# Patient Record
Sex: Male | Born: 1995 | Race: White | Hispanic: No | Marital: Single | Smoking: Current every day smoker
Health system: Southern US, Community
[De-identification: ages and names within clinical notes are randomized; demographics above are authoritative.]

## PROBLEM LIST (undated history)

## (undated) DIAGNOSIS — F1510 Other stimulant abuse, uncomplicated: Secondary | ICD-10-CM

## (undated) DIAGNOSIS — F1110 Opioid abuse, uncomplicated: Secondary | ICD-10-CM

## (undated) DIAGNOSIS — Z720 Tobacco use: Secondary | ICD-10-CM

## (undated) DIAGNOSIS — F419 Anxiety disorder, unspecified: Secondary | ICD-10-CM

## (undated) DIAGNOSIS — F1910 Other psychoactive substance abuse, uncomplicated: Secondary | ICD-10-CM

## (undated) DIAGNOSIS — F329 Major depressive disorder, single episode, unspecified: Secondary | ICD-10-CM

## (undated) DIAGNOSIS — F1010 Alcohol abuse, uncomplicated: Secondary | ICD-10-CM

## (undated) DIAGNOSIS — I10 Essential (primary) hypertension: Secondary | ICD-10-CM

## (undated) DIAGNOSIS — M47812 Spondylosis without myelopathy or radiculopathy, cervical region: Secondary | ICD-10-CM

## (undated) DIAGNOSIS — M549 Dorsalgia, unspecified: Secondary | ICD-10-CM

## (undated) DIAGNOSIS — F32A Depression, unspecified: Secondary | ICD-10-CM

## (undated) DIAGNOSIS — F200 Paranoid schizophrenia: Secondary | ICD-10-CM

## (undated) DIAGNOSIS — F1113 Opioid abuse with withdrawal: Secondary | ICD-10-CM

---
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
## 2015-06-02 ENCOUNTER — Emergency Department
Admission: EM | Admit: 2015-06-02 | Discharge: 2015-06-02 | Discharge status: Against Medical Advice | Payer: Medicaid Other | Attending: Emergency Medicine | Admitting: Emergency Medicine

## 2015-06-02 DIAGNOSIS — H1131 Conjunctival hemorrhage, right eye: Secondary | ICD-10-CM | POA: Insufficient documentation

## 2015-06-02 DIAGNOSIS — F17200 Nicotine dependence, unspecified, uncomplicated: Secondary | ICD-10-CM | POA: Diagnosis not present

## 2015-06-02 DIAGNOSIS — F1410 Cocaine abuse, uncomplicated: Secondary | ICD-10-CM | POA: Diagnosis present

## 2015-06-02 DIAGNOSIS — Y9269 Other specified industrial and construction area as the place of occurrence of the external cause: Secondary | ICD-10-CM | POA: Diagnosis not present

## 2015-06-02 DIAGNOSIS — W11XXXA Fall on and from ladder, initial encounter: Secondary | ICD-10-CM | POA: Insufficient documentation

## 2015-06-02 DIAGNOSIS — Y998 Other external cause status: Secondary | ICD-10-CM | POA: Diagnosis not present

## 2015-06-02 DIAGNOSIS — S0083XA Contusion of other part of head, initial encounter: Secondary | ICD-10-CM | POA: Insufficient documentation

## 2015-06-02 DIAGNOSIS — Y9389 Activity, other specified: Secondary | ICD-10-CM | POA: Insufficient documentation

## 2015-06-02 NOTE — ED Notes (Signed)
Dr spoke with pt - pt states he will stay then stated he was leaving - took clothes and left.

## 2015-06-02 NOTE — ED Notes (Signed)
Pt gave urine sample - then states he wants to leave. Dr made aware. Pt is voluntary for drug treatment and is calling for a ride.
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
## 2015-06-02 NOTE — ED Notes (Addendum)
Patient to ER for detox from drugs and alcohol. Voluntary.

## 2015-06-02 NOTE — ED Notes (Signed)
Pt arrives to ER wanting detox from alcohol, heroin, crack and cigarettes. Pt has not gone through detox before. Pt last used crack this AM. Pt has not used alcohol in " a couple of days". Denies HI or SI. Pt alert and oriented X4, active, cooperative, pt in NAD. RR even and unlabored, color WNL.

## 2015-06-02 NOTE — ED Notes (Signed)
Pt asking for drug and alcohol treatment. Dr is with pt speaking with him, calvin from tts is made aware.

## 2016-04-21 ENCOUNTER — Emergency Department (HOSPITAL_COMMUNITY)
Admission: EM | Admit: 2016-04-21 | Discharge: 2016-04-21 | Disposition: A | Discharge status: Home / Self Care | Payer: Medicaid Other | Attending: Emergency Medicine | Admitting: Emergency Medicine

## 2016-04-21 ENCOUNTER — Encounter (HOSPITAL_COMMUNITY): Payer: Self-pay | Admitting: Emergency Medicine

## 2016-04-21 DIAGNOSIS — T148XXA Other injury of unspecified body region, initial encounter: Secondary | ICD-10-CM

## 2016-04-21 DIAGNOSIS — R300 Dysuria: Secondary | ICD-10-CM | POA: Insufficient documentation

## 2016-04-21 DIAGNOSIS — X58XXXA Exposure to other specified factors, initial encounter: Secondary | ICD-10-CM | POA: Diagnosis not present

## 2016-04-21 DIAGNOSIS — Y939 Activity, unspecified: Secondary | ICD-10-CM | POA: Diagnosis not present

## 2016-04-21 DIAGNOSIS — S299XXA Unspecified injury of thorax, initial encounter: Secondary | ICD-10-CM | POA: Diagnosis present

## 2016-04-21 DIAGNOSIS — I10 Essential (primary) hypertension: Secondary | ICD-10-CM | POA: Diagnosis not present

## 2016-04-21 DIAGNOSIS — F17200 Nicotine dependence, unspecified, uncomplicated: Secondary | ICD-10-CM | POA: Diagnosis not present

## 2016-04-21 DIAGNOSIS — S29012A Strain of muscle and tendon of back wall of thorax, initial encounter: Secondary | ICD-10-CM | POA: Insufficient documentation

## 2016-04-21 DIAGNOSIS — Y929 Unspecified place or not applicable: Secondary | ICD-10-CM | POA: Diagnosis not present

## 2016-04-21 DIAGNOSIS — Y999 Unspecified external cause status: Secondary | ICD-10-CM | POA: Diagnosis not present

## 2016-04-21 DIAGNOSIS — S39012A Strain of muscle, fascia and tendon of lower back, initial encounter: Secondary | ICD-10-CM | POA: Insufficient documentation

## 2016-04-21 HISTORY — DX: Depression, unspecified: F32.A

## 2016-04-21 HISTORY — DX: Anxiety disorder, unspecified: F41.9

## 2016-04-21 HISTORY — DX: Major depressive disorder, single episode, unspecified: F32.9

## 2016-04-21 HISTORY — DX: Paranoid schizophrenia: F20.0

## 2016-04-21 HISTORY — DX: Dorsalgia, unspecified: M54.9

## 2016-04-21 HISTORY — DX: Essential (primary) hypertension: I10

## 2016-04-21 LAB — URINALYSIS, ROUTINE W REFLEX MICROSCOPIC
Bilirubin Urine: NEGATIVE
Glucose, UA: NEGATIVE mg/dL
Hgb urine dipstick: NEGATIVE
Leukocytes, UA: NEGATIVE
Nitrite: NEGATIVE
Protein, ur: NEGATIVE mg/dL
Specific Gravity, Urine: 1.015 (ref 1.005–1.030)
pH: 7.5 (ref 5.0–8.0)

## 2016-04-21 LAB — BASIC METABOLIC PANEL
Anion gap: 8 (ref 5–15)
BUN: 20 mg/dL (ref 6–20)
CO2: 28 mmol/L (ref 22–32)
Calcium: 9.8 mg/dL (ref 8.9–10.3)
Chloride: 100 mmol/L — ABNORMAL LOW (ref 101–111)
Creatinine, Ser: 1.33 mg/dL — ABNORMAL HIGH (ref 0.61–1.24)
GFR calc Af Amer: >60 mL/min (ref >60)
GFR calc non Af Amer: >60 mL/min (ref >60)
Glucose, Bld: 102 mg/dL — ABNORMAL HIGH (ref 65–99)
Potassium: 4.3 mmol/L (ref 3.5–5.1)
Sodium: 136 mmol/L (ref 135–145)

## 2016-04-21 LAB — CBC
HCT: 44 % (ref 39.0–52.0)
Hemoglobin: 14.7 g/dL (ref 13.0–17.0)
MCH: 28.8 pg (ref 26.0–34.0)
MCHC: 33.4 g/dL (ref 30.0–36.0)
MCV: 86.3 fL (ref 78.0–100.0)
Platelets: 333 10*3/uL (ref 150–400)
RBC: 5.1 MIL/uL (ref 4.22–5.81)
RDW: 12.9 % (ref 11.5–15.5)
WBC: 8.6 10*3/uL (ref 4.0–10.5)

## 2016-04-21 LAB — CK: CK TOTAL: 148 U/L (ref 49–397)

## 2016-04-21 MED ORDER — METHOCARBAMOL 500 MG PO TABS: 500.0000 mg | ORAL_TABLET | Freq: Three times a day (TID) | ORAL | 0 refills | Status: DC

## 2016-04-21 MED ORDER — TRAMADOL HCL 50 MG PO TABS
100.0000 mg | ORAL_TABLET | Freq: Once | ORAL | Status: AC
  Administered 2016-04-21: 100 mg via ORAL
  Filled 2016-04-21: qty 2

## 2016-04-21 MED ORDER — METHOCARBAMOL 500 MG PO TABS
1000.0000 mg | ORAL_TABLET | Freq: Once | ORAL | Status: AC
  Administered 2016-04-21: 1000 mg via ORAL
  Filled 2016-04-21: qty 2

## 2016-04-21 MED ORDER — ONDANSETRON HCL 4 MG PO TABS
4.0000 mg | ORAL_TABLET | Freq: Once | ORAL | Status: AC
Start: 2016-04-21 — End: 2016-04-21
  Administered 2016-04-21: 4 mg via ORAL
  Filled 2016-04-21: qty 1

## 2016-04-21 NOTE — ED Provider Notes (Signed)
[facility]-EMERGENCY DEPT Provider Note   CSN: [id number] Arrival date & time: 04/21/16  [id number]     History   Chief Complaint Chief Complaint  Patient presents with  . Flank Pain    HPI Cody Poole is a 21 y.o. male.  Patient is a 21 year old male who presents to the emergency department with a complaint of bilateral flank area pain. The patient presents by EMS with a two-week history of bilateral flank pain and back pain. The patient also states that he has some burning with urination. The patient also mentioned to the nurse during triage "I was supposed to be in court today so I need a note when I leave".  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 He also states that he has back problems regularly. He has family members who have had scoliosis in the past, and he has not been evaluated for the scoliosis. He states that it has been his intention to see a doctor over the past week, but he has not had an opportunity to do so. Today the pain was more intense and he said he just could not take it anymore so he presented to the emergency department for evaluation. The patient denies any scrotal area pain, this been no discharge from the penis. He's not had any injury to his flank that he is aware of. He has not seen any blood in his urine on, and he's not had any operations or procedures involving his kidneys. He has no difficulty with his breathing, he is not coughing up blood, and has not had any injury to his chest or lung area. He has not taken any medication for this discomfort up to this point.      Past Medical History:  Diagnosis Date  . Anxiety   . Back pain   . Depression   . Hypertension   . Paranoid schizophrenia (HCC)     There are no active problems to display for this patient.   History reviewed. No pertinent surgical history.     Home Medications    Prior to Admission medications   Not on  File    Family History History reviewed. No pertinent family history.  Social History Social History  Substance Use Topics  . Smoking status: Current Every Day Smoker  . Smokeless tobacco: Never Used  . Alcohol use No     Allergies   Patient has no active allergies.   Review of Systems Review of Systems  Constitutional: Negative for activity change and fever.       All ROS Neg except as noted in HPI  HENT: Negative for nosebleeds.   Eyes: Negative for photophobia and discharge.  Respiratory: Negative for cough, shortness of breath and wheezing.   Cardiovascular: Negative for chest pain and palpitations.  Gastrointestinal: Negative for abdominal pain and blood in stool.  Genitourinary: Positive for dysuria. Negative for discharge, frequency, hematuria, penile pain, penile swelling and testicular pain.  Musculoskeletal: Positive for back pain. Negative for arthralgias and neck pain.  Skin: Negative.   Neurological: Negative for dizziness, seizures and speech difficulty.  Psychiatric/Behavioral: Negative for confusion and hallucinations.     Physical Exam Updated Vital Signs BP 162/96 (BP Location: Left Arm)   Pulse 82   Temp 97.6 F (36.4 C) (Oral)   Resp 16   Ht 5\' 9"  (1.753 m)   Wt 83.9 kg   SpO2 96%  BMI 27.32 kg/m   Physical Exam  Constitutional: He is oriented to person, place, and time. He appears well-developed and well-nourished.  Non-toxic appearance.  HENT:  Head: Normocephalic.  Right Ear: Tympanic membrane and external ear normal.  Left Ear: Tympanic membrane and external ear normal.  Eyes: EOM and lids are normal. Pupils are equal, round, and reactive to light.  Neck: Normal range of motion. Neck supple. Carotid bruit is not present.  Cardiovascular: Normal rate, regular rhythm, normal heart sounds, intact distal pulses and normal pulses.   Pulmonary/Chest: Breath sounds normal. No respiratory distress.  Abdominal: Soft. Bowel sounds are normal.  There is no tenderness. There is no guarding.  Musculoskeletal: Normal range of motion.       Thoracic back: He exhibits tenderness and spasm.       Lumbar back: He exhibits spasm.       Back:  Lymphadenopathy:       Head (right side): No submandibular adenopathy present.       Head (left side): No submandibular adenopathy present.    He has no cervical adenopathy.  Neurological: He is alert and oriented to person, place, and time. He has normal strength. No cranial nerve deficit or sensory deficit.  Skin: Skin is warm and dry.  Psychiatric: He has a normal mood and affect. His speech is normal.  Nursing note and vitals reviewed.    ED Treatments / Results  Labs (all labs ordered are listed, but only abnormal results are displayed) Labs Reviewed  URINALYSIS, ROUTINE W REFLEX MICROSCOPIC  CBC  BASIC METABOLIC PANEL    EKG  EKG Interpretation None       Radiology No results found.  Procedures Procedures (including critical care time)  Medications Ordered in ED Medications - No data to display   Initial Impression / Assessment and Plan / ED Course  I have reviewed the triage vital signs and the nursing notes.  Pertinent labs & imaging results that were available during my care of the patient were reviewed by me and considered in my medical decision making (see chart for details).  Clinical Course     **I have reviewed nursing notes, vital signs, and all appropriate lab and imaging results for this patient.*  Final Clinical Impressions(s) / ED Diagnoses  MDM 21 year old male who presents to the emergency department with bilateral flank pain and burning with urination. The CK is within normal limits at 148. Doubt rhabdomyolysis or muscle injury. Urinalysis shows a clear yellow specimen with a specific gravity 1.015. There is no glucose present, no hemoglobin present on dipstick, there is a trace of ketones, otherwise the urine test is completely negative. Doubt  urinary tract infection or kidney stone or dehydration. The complete blood count was well within normal limits. Doubt a systemic infection. The basic metabolic panel shows the chloride to be low at 100 and the glucose to be high at 102. The creatinine is elevated at 1.33. The patient states however that over the past week he's been having some upper respiratory symptoms. I have asked the patient to see his Medicaid access physician for additional evaluation and recheck of this elevated creatinine on.  The patient has on been drinking liquids in the emergency department without problem. He has been treated with a muscle relaxer and pain medication. He has been on his phone most of the time during his emergency department visit, and appears to be in no distress. He speaks in complete sentences without any problem. He does  walk with some slowness and stiffness, complaining of his back hurting him.  The plan at this time is for the patient to be treated with Robaxin. He will be referred to Dr. Romeo Apple for orthopedic evaluation if not improving. A work note has been given to the patient on excusing him to return on Friday, January 12.    Final diagnoses:  Muscle strain    New Prescriptions New Prescriptions   No medications on file     Ivery Quale, [place] 04/21/16 0981    Raeford Razor, MD 04/24/16 [phone]

## 2016-04-21 NOTE — Discharge Instructions (Signed)
Your emergency room examination is negative for urinary tract infection, and negative for evidence of kidney stone. There is no evidence of injury to the muscle. There is question of muscle strain, particularly concerning your back. Please use warm Epsom salt soaks for about 10-15 minutes daily. Use Robaxin 3 times daily for muscle spasm. This medication may cause drowsiness. Please do not drink alcohol, drive a vehicle, operating machinery, or participate in activities requiring concentration when taking this medication. Please see Dr. Romeo AppleHarrison for orthopedic evaluation if this is not improving. Your creatinine, which is one of your kidney function test was elevated today. Please see your Medicaid access physician for recheck of this in about 1-2 weeks.

## 2016-04-21 NOTE — ED Triage Notes (Addendum)
Patient brought in by EMS with complaint of bilateral flank pain x 2 weeks. Also complains of burning with urination. Per patient, "I was supposed to be in court today so I need a note when I leave."

## 2020-04-21 ENCOUNTER — Emergency Department (HOSPITAL_COMMUNITY)
Admission: EM | Admit: 2020-04-21 | Discharge: 2020-04-21 | Disposition: A | Discharge status: Home / Self Care | Payer: Self-pay | Attending: Emergency Medicine | Admitting: Emergency Medicine

## 2020-04-21 ENCOUNTER — Other Ambulatory Visit: Payer: Self-pay

## 2020-04-21 ENCOUNTER — Encounter (HOSPITAL_COMMUNITY): Payer: Self-pay

## 2020-04-21 DIAGNOSIS — H9209 Otalgia, unspecified ear: Secondary | ICD-10-CM | POA: Insufficient documentation

## 2020-04-21 DIAGNOSIS — Z5321 Procedure and treatment not carried out due to patient leaving prior to being seen by health care provider: Secondary | ICD-10-CM | POA: Insufficient documentation

## 2020-04-21 DIAGNOSIS — K122 Cellulitis and abscess of mouth: Secondary | ICD-10-CM | POA: Insufficient documentation

## 2020-04-21 NOTE — ED Notes (Signed)
Pt called 3x no response  

## 2020-04-21 NOTE — ED Triage Notes (Signed)
Pt reports he is here today due to ear pain. Pt reports he has an abscess in his mouth and the pain is radiating to his ear.

## 2020-08-24 ENCOUNTER — Other Ambulatory Visit: Payer: Self-pay

## 2020-08-24 ENCOUNTER — Encounter (HOSPITAL_COMMUNITY): Payer: Self-pay

## 2020-08-24 ENCOUNTER — Ambulatory Visit (HOSPITAL_COMMUNITY)
Admission: EM | Admit: 2020-08-24 | Discharge: 2020-08-24 | Disposition: A | Discharge status: Home / Self Care | Payer: Medicaid Other | Attending: Medical Oncology | Admitting: Medical Oncology

## 2020-08-24 DIAGNOSIS — H6593 Unspecified nonsuppurative otitis media, bilateral: Secondary | ICD-10-CM

## 2020-08-24 DIAGNOSIS — F200 Paranoid schizophrenia: Secondary | ICD-10-CM

## 2020-08-24 DIAGNOSIS — B353 Tinea pedis: Secondary | ICD-10-CM

## 2020-08-24 MED ORDER — FLUTICASONE PROPIONATE 50 MCG/ACT NA SUSP: 2.0000 | Freq: Every day | NASAL | 0 refills | Status: DC

## 2020-08-24 MED ORDER — CLOTRIMAZOLE 1 % EX CREA: TOPICAL_CREAM | CUTANEOUS | 0 refills | Status: DC

## 2020-08-24 NOTE — ED Provider Notes (Signed)
[facility]    CSN: [id number] Arrival date & time: 08/24/20  1147      History   Chief Complaint Chief Complaint  Patient presents with  . Sinus Issues    HPI Cody Poole is a 25 y.o. male.   HPI  Sinus Issues: Patient states that for the past 1 to 6 months he has had some sinus congestion.  He is concerned that he has bugs or parasites in his ears and nose.  He also is concerned that he has bugs or parasites on his feet.  He reports that he has been out of his psychiatric medication for quite some time and has had delusions, hearing voices, etc. he states that the "feds are out to kill me" at random during our encounter but otherwise is able to keep conversation fairly in line with our conversation.  He states that he has been using a 10% bleach solution on his skin to help with his symptoms without improvement.  Past Medical History:  Diagnosis Date  . Anxiety   . Back pain   . Depression   . Hypertension   . Paranoid schizophrenia (HCC)     There are no problems to display for this patient.   History reviewed. No pertinent surgical history.    Home Medications    Prior to Admission medications   Medication Sig Start Date End Date Taking? Authorizing Provider  methocarbamol (ROBAXIN) 500 MG tablet Take 1 tablet (500 mg total) by mouth 3 (three) times daily. 04/21/16   Ivery Quale, PA-C    Family History Family History  Family history unknown: Yes    Social History Social History   Tobacco Use  . Smoking status: Current Every Day Smoker  . Smokeless tobacco: Never Used  Substance Use Topics  . Alcohol use: No  . Drug use: Yes    Types: IV, Cocaine    Comment: Denies recent use     Allergies   Patient has no known allergies.   Review of Systems Review of Systems  As stated above in HPI Physical Exam Triage Vital Signs ED Triage Vitals  Enc Vitals Group     BP 08/24/20 1219 123/87     Pulse Rate 08/24/20 1219 (!) 140     Resp  08/24/20 1219 20     Temp 08/24/20 1219 98.6 F (37 C)     Temp Source 08/24/20 1219 Oral     SpO2 08/24/20 1219 98 %     Weight --      Height --      Head Circumference --      Peak Flow --      Pain Score 08/24/20 1217 4     Pain Loc --      Pain Edu? --      Excl. in GC? --    No data found.  Updated Vital Signs BP 123/87 (BP Location: Right Arm)   Pulse (!) 140   Temp 98.6 F (37 C) (Oral)   Resp 20   SpO2 98%   Physical Exam Vitals and nursing note reviewed.  Constitutional:      General: He is not in acute distress.    Appearance: He is not ill-appearing, toxic-appearing or diaphoretic.  HENT:     Head: Normocephalic and atraumatic.     Right Ear: Tympanic membrane, ear canal and external ear normal. There is no impacted cerumen.     Left Ear: Tympanic membrane, ear canal and external  ear normal. There is no impacted cerumen.     Ears:     Comments: Mild clear middle ear effusion    Nose: Nose normal.     Mouth/Throat:     Mouth: Mucous membranes are moist.     Pharynx: Oropharynx is clear. No oropharyngeal exudate or posterior oropharyngeal erythema.  Eyes:     Extraocular Movements: Extraocular movements intact.     Pupils: Pupils are equal, round, and reactive to light.  Cardiovascular:     Rate and Rhythm: Normal rate and regular rhythm.     Heart sounds: Normal heart sounds.     Comments: Rate normalized by the time he was examined Pulmonary:     Effort: Pulmonary effort is normal.     Breath sounds: Normal breath sounds.  Musculoskeletal:     Cervical back: Neck supple.  Lymphadenopathy:     Cervical: No cervical adenopathy.  Skin:    Findings: Rash (mild erythema and mild scale of bilateral feet) present.  Neurological:     Mental Status: He is alert and oriented to person, place, and time.  Psychiatric:     Comments: Thoughts are pressured in general are not tangential.  He has a flat affect.       UC Treatments / Results  Labs (all labs  ordered are listed, but only abnormal results are displayed) Labs Reviewed - No data to display  EKG   Radiology No results found.  Procedures Procedures (including critical care time)  Medications Ordered in UC Medications - No data to display  Initial Impression / Assessment and Plan / UC Course  I have reviewed the triage vital signs and the nursing notes.  Pertinent labs & imaging results that were available during my care of the patient were reviewed by me and considered in my medical decision making (see chart for details).     Sinus congestion and middle ear effusion and tinea pedis.  I have recommended Flonase and athlete's foot cream for his symptoms respectively.  I discussed these with patient and he felt more comfortable with his diagnoses.  I encouraged him to go to our emergency department or our psychiatric urgent care and he states that he will do this to get restarted on his medications. Final Clinical Impressions(s) / UC Diagnoses   Final diagnoses:  None   Discharge Instructions   None    ED Prescriptions    None     PDMP not reviewed this encounter.   Rushie Chestnut, [place] 08/24/20 1244

## 2020-08-24 NOTE — ED Notes (Signed)
Pt states he takes haldol and cyboxin but does not remember the dosage.

## 2020-08-24 NOTE — ED Triage Notes (Signed)
Pt presents with sinus congestion & pain and bilateral ear pain X 1 month.

## 2020-10-13 ENCOUNTER — Emergency Department (HOSPITAL_COMMUNITY): Payer: Self-pay

## 2020-10-13 ENCOUNTER — Emergency Department (HOSPITAL_COMMUNITY)
Admission: EM | Admit: 2020-10-13 | Discharge: 2020-10-13 | Disposition: A | Discharge status: Home / Self Care | Payer: Self-pay | Attending: Emergency Medicine | Admitting: Emergency Medicine

## 2020-10-13 DIAGNOSIS — Z20822 Contact with and (suspected) exposure to covid-19: Secondary | ICD-10-CM | POA: Insufficient documentation

## 2020-10-13 DIAGNOSIS — Z23 Encounter for immunization: Secondary | ICD-10-CM | POA: Insufficient documentation

## 2020-10-13 DIAGNOSIS — R000 Tachycardia, unspecified: Secondary | ICD-10-CM | POA: Insufficient documentation

## 2020-10-13 DIAGNOSIS — R410 Disorientation, unspecified: Secondary | ICD-10-CM | POA: Insufficient documentation

## 2020-10-13 DIAGNOSIS — I10 Essential (primary) hypertension: Secondary | ICD-10-CM | POA: Insufficient documentation

## 2020-10-13 DIAGNOSIS — F1910 Other psychoactive substance abuse, uncomplicated: Secondary | ICD-10-CM | POA: Insufficient documentation

## 2020-10-13 DIAGNOSIS — R4182 Altered mental status, unspecified: Secondary | ICD-10-CM | POA: Insufficient documentation

## 2020-10-13 DIAGNOSIS — F19129 Other psychoactive substance abuse with intoxication, unspecified: Secondary | ICD-10-CM | POA: Insufficient documentation

## 2020-10-13 DIAGNOSIS — T1490XA Injury, unspecified, initial encounter: Secondary | ICD-10-CM

## 2020-10-13 DIAGNOSIS — M545 Low back pain, unspecified: Secondary | ICD-10-CM | POA: Insufficient documentation

## 2020-10-13 DIAGNOSIS — S50811A Abrasion of right forearm, initial encounter: Secondary | ICD-10-CM | POA: Insufficient documentation

## 2020-10-13 DIAGNOSIS — F19920 Other psychoactive substance use, unspecified with intoxication, uncomplicated: Secondary | ICD-10-CM

## 2020-10-13 DIAGNOSIS — F17200 Nicotine dependence, unspecified, uncomplicated: Secondary | ICD-10-CM | POA: Insufficient documentation

## 2020-10-13 DIAGNOSIS — R109 Unspecified abdominal pain: Secondary | ICD-10-CM | POA: Insufficient documentation

## 2020-10-13 LAB — I-STAT CHEM 8, ED
BUN: 19 mg/dL (ref 6–20)
Calcium, Ion: 0.68 mmol/L — CL (ref 1.15–1.40)
Chloride: 115 mmol/L — ABNORMAL HIGH (ref 98–111)
Creatinine, Ser: 0.4 mg/dL — ABNORMAL LOW (ref 0.61–1.24)
Glucose, Bld: 83 mg/dL (ref 70–99)
HCT: 26 % — ABNORMAL LOW (ref 39.0–52.0)
Hemoglobin: 8.8 g/dL — ABNORMAL LOW (ref 13.0–17.0)
Potassium: 3.3 mmol/L — ABNORMAL LOW (ref 3.5–5.1)
Sodium: 142 mmol/L (ref 135–145)
TCO2: 18 mmol/L — ABNORMAL LOW (ref 22–32)

## 2020-10-13 LAB — RAPID URINE DRUG SCREEN, HOSP PERFORMED
Amphetamines: POSITIVE — AB
Barbiturates: NOT DETECTED
Benzodiazepines: NOT DETECTED
Cocaine: NOT DETECTED
Opiates: POSITIVE — AB
Tetrahydrocannabinol: POSITIVE — AB

## 2020-10-13 LAB — CBC
HCT: 40.5 % (ref 39.0–52.0)
Hemoglobin: 12.6 g/dL — ABNORMAL LOW (ref 13.0–17.0)
MCH: 29.3 pg (ref 26.0–34.0)
MCHC: 31.1 g/dL (ref 30.0–36.0)
MCV: 94.2 fL (ref 80.0–100.0)
Platelets: UNDETERMINED 10*3/uL (ref 150–400)
RBC: 4.3 MIL/uL (ref 4.22–5.81)
RDW: 13.5 % (ref 11.5–15.5)
WBC: 2.4 10*3/uL — ABNORMAL LOW (ref 4.0–10.5)
nRBC: 0 % (ref 0.0–0.2)

## 2020-10-13 LAB — COMPREHENSIVE METABOLIC PANEL
ALT: 17 U/L (ref 0–44)
AST: 32 U/L (ref 15–41)
Albumin: 4.6 g/dL (ref 3.5–5.0)
Alkaline Phosphatase: 69 U/L (ref 38–126)
Anion gap: 9 (ref 5–15)
BUN: 22 mg/dL — ABNORMAL HIGH (ref 6–20)
CO2: 24 mmol/L (ref 22–32)
Calcium: 9.5 mg/dL (ref 8.9–10.3)
Chloride: 103 mmol/L (ref 98–111)
Creatinine, Ser: 0.87 mg/dL (ref 0.61–1.24)
GFR, Estimated: >60 mL/min (ref >60)
Glucose, Bld: 120 mg/dL — ABNORMAL HIGH (ref 70–99)
Potassium: 4.1 mmol/L (ref 3.5–5.1)
Sodium: 136 mmol/L (ref 135–145)
Total Bilirubin: 1.4 mg/dL — ABNORMAL HIGH (ref 0.3–1.2)
Total Protein: 7.6 g/dL (ref 6.5–8.1)

## 2020-10-13 LAB — URINALYSIS, ROUTINE W REFLEX MICROSCOPIC
Bilirubin Urine: NEGATIVE
Glucose, UA: NEGATIVE mg/dL
Hgb urine dipstick: NEGATIVE
Ketones, ur: 5 mg/dL — AB
Leukocytes,Ua: NEGATIVE
Nitrite: NEGATIVE
Protein, ur: NEGATIVE mg/dL
Specific Gravity, Urine: >1.046 — ABNORMAL HIGH (ref 1.005–1.030)
pH: 5 (ref 5.0–8.0)

## 2020-10-13 LAB — PROTIME-INR
INR: 1.1 (ref 0.8–1.2)
Prothrombin Time: 14.3 seconds (ref 11.4–15.2)

## 2020-10-13 LAB — LACTIC ACID, PLASMA: Lactic Acid, Venous: 0.4 mmol/L — ABNORMAL LOW (ref 0.5–1.9)

## 2020-10-13 LAB — ETHANOL: Alcohol, Ethyl (B): <10 mg/dL (ref <10)

## 2020-10-13 LAB — TROPONIN I (HIGH SENSITIVITY): Troponin I (High Sensitivity): <2 ng/L (ref <18)

## 2020-10-13 LAB — RESP PANEL BY RT-PCR (FLU A&B, COVID) ARPGX2
Influenza A by PCR: NEGATIVE
Influenza B by PCR: NEGATIVE
SARS Coronavirus 2 by RT PCR: NEGATIVE

## 2020-10-13 MED ORDER — LACTATED RINGERS IV BOLUS: 1000.0000 mL | Freq: Once | INTRAVENOUS | Status: DC

## 2020-10-13 MED ORDER — IOHEXOL 300 MG/ML  SOLN
100.0000 mL | Freq: Once | INTRAMUSCULAR | Status: AC | PRN
  Administered 2020-10-13: 100 mL via INTRAVENOUS

## 2020-10-13 MED ORDER — LACTATED RINGERS IV BOLUS
1000.0000 mL | Freq: Once | INTRAVENOUS | Status: AC
  Administered 2020-10-13: 1000 mL via INTRAVENOUS

## 2020-10-13 MED ORDER — METHOCARBAMOL 500 MG PO TABS: 500.0000 mg | ORAL_TABLET | Freq: Two times a day (BID) | ORAL | 0 refills | Status: DC

## 2020-10-13 MED ORDER — TETANUS-DIPHTH-ACELL PERTUSSIS 5-2.5-18.5 LF-MCG/0.5 IM SUSY
0.5000 mL | PREFILLED_SYRINGE | Freq: Once | INTRAMUSCULAR | Status: AC
  Administered 2020-10-13: 0.5 mL via INTRAMUSCULAR
  Filled 2020-10-13: qty 0.5
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
## 2020-10-13 NOTE — ED Notes (Signed)
Patient is a poor historian,  PA student at the bedside. Patient appears under the influence of substance.  His speech is slurred and falls asleep quickly.  When asked if he is homeless, she answers "somewhat"

## 2020-10-13 NOTE — ED Notes (Signed)
Patient continues to appear drowsy- arouses easily.  Steffanie Rainwater is at the bedside.  Speech is mumbling, falls asleep mid sentence
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
## 2020-11-09 ENCOUNTER — Other Ambulatory Visit: Payer: Self-pay

## 2020-11-09 ENCOUNTER — Emergency Department (HOSPITAL_COMMUNITY)
Admission: EM | Admit: 2020-11-09 | Discharge: 2020-11-09 | Disposition: A | Discharge status: Home / Self Care | Payer: Medicaid Other | Attending: Physician Assistant | Admitting: Physician Assistant

## 2020-11-09 ENCOUNTER — Encounter (HOSPITAL_COMMUNITY): Payer: Self-pay | Admitting: Emergency Medicine

## 2020-11-09 DIAGNOSIS — Z5321 Procedure and treatment not carried out due to patient leaving prior to being seen by health care provider: Secondary | ICD-10-CM | POA: Insufficient documentation

## 2020-11-09 DIAGNOSIS — F209 Schizophrenia, unspecified: Secondary | ICD-10-CM | POA: Insufficient documentation

## 2020-11-09 NOTE — ED Triage Notes (Signed)
Arrives via EMS from a [facility], reports non-compliance with schizophrenia medications, took amphetamines yesterday and is now hallucinating. Per fiance pt is at baseline.

## 2020-11-09 NOTE — ED Provider Notes (Signed)
Emergency Medicine Provider Triage Evaluation Note  Cody Poole , a 25 y.o. male  was evaluated in triage.  Pt complains of worsening symptoms associated with schizophrenia.  Patient states that he has a history of schizophrenia and gets once monthly injections for this.  States he has not had an injection for about 6 weeks.  Reports hearing voices and "feeling as if people are out to get him".  States that he has difficulty going out in public due to his symptoms.  Denies any SI/HI.  Physical Exam  BP 121/86 (BP Location: Left Arm)   Pulse (!) 131   Temp 98 F (36.7 C) (Oral)   Resp 16   SpO2 96%  Gen:   Awake, no distress   Resp:  Normal effort  MSK:   Moves extremities without difficulty  Other:    Medical Decision Making  Medically screening exam initiated at 12:49 PM.  Appropriate orders placed.  Eileen Croswell was informed that the remainder of the evaluation will be completed by another provider, this initial triage assessment does not replace that evaluation, and the importance of remaining in the ED until their evaluation is complete.  Will obtain medical screening labs.   Placido Sou, PA-C 11/09/20 1252    Mancel Bale, MD 11/09/20 [id number]

## 2021-05-10 ENCOUNTER — Emergency Department (HOSPITAL_COMMUNITY): Payer: Medicaid Other

## 2021-05-10 ENCOUNTER — Encounter (HOSPITAL_COMMUNITY): Payer: Self-pay | Admitting: Emergency Medicine

## 2021-05-10 ENCOUNTER — Emergency Department (HOSPITAL_COMMUNITY)
Admission: EM | Admit: 2021-05-10 | Discharge: 2021-05-11 | Disposition: A | Discharge status: Other Acute Inpatient Hospital | Payer: Medicaid Other | Attending: Emergency Medicine | Admitting: Emergency Medicine

## 2021-05-10 ENCOUNTER — Other Ambulatory Visit: Payer: Self-pay

## 2021-05-10 DIAGNOSIS — R440 Auditory hallucinations: Secondary | ICD-10-CM | POA: Insufficient documentation

## 2021-05-10 DIAGNOSIS — F1520 Other stimulant dependence, uncomplicated: Secondary | ICD-10-CM | POA: Insufficient documentation

## 2021-05-10 DIAGNOSIS — J3489 Other specified disorders of nose and nasal sinuses: Secondary | ICD-10-CM | POA: Insufficient documentation

## 2021-05-10 DIAGNOSIS — R000 Tachycardia, unspecified: Secondary | ICD-10-CM | POA: Insufficient documentation

## 2021-05-10 DIAGNOSIS — R45851 Suicidal ideations: Secondary | ICD-10-CM | POA: Insufficient documentation

## 2021-05-10 DIAGNOSIS — R197 Diarrhea, unspecified: Secondary | ICD-10-CM | POA: Insufficient documentation

## 2021-05-10 DIAGNOSIS — F1910 Other psychoactive substance abuse, uncomplicated: Secondary | ICD-10-CM

## 2021-05-10 DIAGNOSIS — F1120 Opioid dependence, uncomplicated: Secondary | ICD-10-CM | POA: Insufficient documentation

## 2021-05-10 DIAGNOSIS — M791 Myalgia, unspecified site: Secondary | ICD-10-CM | POA: Insufficient documentation

## 2021-05-10 DIAGNOSIS — Z046 Encounter for general psychiatric examination, requested by authority: Secondary | ICD-10-CM | POA: Insufficient documentation

## 2021-05-10 DIAGNOSIS — R0602 Shortness of breath: Secondary | ICD-10-CM | POA: Insufficient documentation

## 2021-05-10 DIAGNOSIS — Z20822 Contact with and (suspected) exposure to covid-19: Secondary | ICD-10-CM | POA: Insufficient documentation

## 2021-05-10 DIAGNOSIS — R6889 Other general symptoms and signs: Secondary | ICD-10-CM

## 2021-05-10 DIAGNOSIS — R5383 Other fatigue: Secondary | ICD-10-CM | POA: Insufficient documentation

## 2021-05-10 LAB — RAPID URINE DRUG SCREEN, HOSP PERFORMED
Amphetamines: POSITIVE — AB
Barbiturates: NOT DETECTED
Benzodiazepines: NOT DETECTED
Cocaine: POSITIVE — AB
Opiates: NOT DETECTED
Tetrahydrocannabinol: POSITIVE — AB

## 2021-05-10 LAB — CBC WITH DIFFERENTIAL/PLATELET
Abs Immature Granulocytes: 0.02 10*3/uL (ref 0.00–0.07)
Basophils Absolute: 0 10*3/uL (ref 0.0–0.1)
Basophils Relative: 0 %
Eosinophils Absolute: 0 10*3/uL (ref 0.0–0.5)
Eosinophils Relative: 0 %
HCT: 44.3 % (ref 39.0–52.0)
Hemoglobin: 14.9 g/dL (ref 13.0–17.0)
Immature Granulocytes: 0 %
Lymphocytes Relative: 16 %
Lymphs Abs: 0.8 10*3/uL (ref 0.7–4.0)
MCH: 28.6 pg (ref 26.0–34.0)
MCHC: 33.6 g/dL (ref 30.0–36.0)
MCV: 85 fL (ref 80.0–100.0)
Monocytes Absolute: 0.3 10*3/uL (ref 0.1–1.0)
Monocytes Relative: 6 %
Neutro Abs: 4.1 10*3/uL (ref 1.7–7.7)
Neutrophils Relative %: 78 %
Platelets: 368 10*3/uL (ref 150–400)
RBC: 5.21 MIL/uL (ref 4.22–5.81)
RDW: 12.6 % (ref 11.5–15.5)
WBC: 5.3 10*3/uL (ref 4.0–10.5)
nRBC: 0 % (ref 0.0–0.2)

## 2021-05-10 LAB — COMPREHENSIVE METABOLIC PANEL
ALT: 40 U/L (ref 0–44)
AST: 46 U/L — ABNORMAL HIGH (ref 15–41)
Albumin: 3.7 g/dL (ref 3.5–5.0)
Alkaline Phosphatase: 76 U/L (ref 38–126)
Anion gap: 11 (ref 5–15)
BUN: 9 mg/dL (ref 6–20)
CO2: 25 mmol/L (ref 22–32)
Calcium: 9.8 mg/dL (ref 8.9–10.3)
Chloride: 103 mmol/L (ref 98–111)
Creatinine, Ser: 0.86 mg/dL (ref 0.61–1.24)
GFR, Estimated: >60 mL/min (ref >60)
Glucose, Bld: 107 mg/dL — ABNORMAL HIGH (ref 70–99)
Potassium: 3.9 mmol/L (ref 3.5–5.1)
Sodium: 139 mmol/L (ref 135–145)
Total Bilirubin: 0.8 mg/dL (ref 0.3–1.2)
Total Protein: 7.2 g/dL (ref 6.5–8.1)

## 2021-05-10 LAB — URINALYSIS, ROUTINE W REFLEX MICROSCOPIC
Bacteria, UA: NONE SEEN
Bilirubin Urine: NEGATIVE
Glucose, UA: NEGATIVE mg/dL
Hgb urine dipstick: NEGATIVE
Ketones, ur: NEGATIVE mg/dL
Leukocytes,Ua: NEGATIVE
Nitrite: NEGATIVE
Protein, ur: 100 mg/dL — AB
Specific Gravity, Urine: 1.02 (ref 1.005–1.030)
pH: 7 (ref 5.0–8.0)

## 2021-05-10 LAB — RESP PANEL BY RT-PCR (FLU A&B, COVID) ARPGX2
Influenza A by PCR: NEGATIVE
Influenza B by PCR: NEGATIVE
SARS Coronavirus 2 by RT PCR: NEGATIVE

## 2021-05-10 LAB — ETHANOL: Alcohol, Ethyl (B): <10 mg/dL (ref <10)

## 2021-05-10 MED ORDER — SODIUM CHLORIDE 0.9 % IV BOLUS
1000.0000 mL | Freq: Once | INTRAVENOUS | Status: AC
  Administered 2021-05-10: 1000 mL via INTRAVENOUS

## 2021-05-10 MED ORDER — CLONIDINE HCL 0.2 MG PO TABS: 0.1000 mg | ORAL_TABLET | Freq: Two times a day (BID) | ORAL | Status: DC

## 2021-05-10 MED ORDER — CLONIDINE HCL 0.2 MG PO TABS: 0.1000 mg | ORAL_TABLET | Freq: Every day | ORAL | Status: DC

## 2021-05-10 MED ORDER — NICOTINE 21 MG/24HR TD PT24
21.0000 mg | MEDICATED_PATCH | Freq: Every day | TRANSDERMAL | Status: DC
  Administered 2021-05-10 – 2021-05-11 (×2): 21 mg via TRANSDERMAL
  Filled 2021-05-10 (×2): qty 1

## 2021-05-10 MED ORDER — HALOPERIDOL 10 MG PO TABS: 10.0000 mg | ORAL_TABLET | Freq: Two times a day (BID) | ORAL | 0 refills | Status: DC

## 2021-05-10 MED ORDER — LOPERAMIDE HCL 2 MG PO CAPS: 2.0000 mg | ORAL_CAPSULE | ORAL | Status: DC | PRN

## 2021-05-10 MED ORDER — METHOCARBAMOL 500 MG PO TABS
500.0000 mg | ORAL_TABLET | Freq: Three times a day (TID) | ORAL | Status: DC | PRN
  Administered 2021-05-10 – 2021-05-11 (×2): 500 mg via ORAL
  Filled 2021-05-10 (×2): qty 1

## 2021-05-10 MED ORDER — DICYCLOMINE HCL 20 MG PO TABS
20.0000 mg | ORAL_TABLET | Freq: Four times a day (QID) | ORAL | Status: DC | PRN
  Administered 2021-05-11: 20 mg via ORAL
  Filled 2021-05-10: qty 1

## 2021-05-10 MED ORDER — CLONIDINE HCL 0.2 MG PO TABS
0.1000 mg | ORAL_TABLET | Freq: Four times a day (QID) | ORAL | Status: DC
  Administered 2021-05-10 – 2021-05-11 (×4): 0.1 mg via ORAL
  Filled 2021-05-10 (×4): qty 1

## 2021-05-10 MED ORDER — HYDROXYZINE HCL 25 MG PO TABS
25.0000 mg | ORAL_TABLET | Freq: Four times a day (QID) | ORAL | Status: DC | PRN
  Administered 2021-05-10 – 2021-05-11 (×2): 25 mg via ORAL
  Filled 2021-05-10 (×2): qty 1

## 2021-05-10 MED ORDER — NAPROXEN 250 MG PO TABS
500.0000 mg | ORAL_TABLET | Freq: Two times a day (BID) | ORAL | Status: DC | PRN
  Administered 2021-05-11 (×2): 500 mg via ORAL
  Filled 2021-05-10 (×2): qty 2

## 2021-05-10 MED ORDER — ONDANSETRON 4 MG PO TBDP
4.0000 mg | ORAL_TABLET | Freq: Four times a day (QID) | ORAL | Status: DC | PRN
  Administered 2021-05-10: 4 mg via ORAL
  Filled 2021-05-10: qty 1

## 2021-05-10 NOTE — ED Notes (Signed)
PT up with assistance to bedside commode.  HR increased to 130's.

## 2021-05-10 NOTE — ED Notes (Signed)
Warm blankets offered to and placed on pt

## 2021-05-10 NOTE — ED Notes (Signed)
Pt requested a snack, pt given crackers and water. Pt still reporting nausea and pt educated to eat small amounts to ensure they do not make him sicker, and if he is able to tolerate the small snacks I will provide him with more
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
## 2021-05-10 NOTE — ED Notes (Signed)
TTS in process 

## 2021-05-10 NOTE — ED Provider Notes (Signed)
[facility] EMERGENCY DEPARTMENT Provider Note   CSN: [id number] Arrival date & time: 05/10/21  1227     History  Chief Complaint  Patient presents with   Generalized Body Aches   Fatigue   Psychiatric Evaluation    Cody Poole is a 26 y.o. male.  He is here with a complaint of feeling sick for a week.  Complaining of body aches burning in his chest shortness of breath nasal congestion.  Has some diarrhea.  States has been eating okay.  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  Complaining of being off his psychiatric medicines.  Tells me he has Haldol prescribed by Dr. Loni Muse at [place].   The history is provided by the patient.  Influenza Presenting symptoms: diarrhea, fatigue, myalgias, rhinorrhea and shortness of breath   Presenting symptoms: no fever, no headaches, no nausea, no sore throat and no vomiting   Severity:  Moderate Onset quality:  Gradual Duration:  1 week Progression:  Unchanged Chronicity:  New Relieved by:  Nothing Worsened by:  Nothing Ineffective treatments:  None tried Associated symptoms: no syncope       Home Medications Prior to Admission medications   Medication Sig Start Date End Date Taking? Authorizing Provider  clotrimazole (LOTRIMIN) 1 % cream Apply to affected area 2 times daily 08/24/20   Hughie Closs, PA-C  fluticasone [facility]) 50 MCG/ACT nasal spray Place 2 sprays into both nostrils daily. 08/24/20   Hughie Closs, PA-C  methocarbamol (ROBAXIN) 500 MG tablet Take 1 tablet (500 mg total) by mouth 2 (two) times daily. 10/13/20   Tedd Sias, PA      Allergies    Patient has no known allergies.    Review of Systems   Review of Systems  Constitutional:  Positive for fatigue. Negative for fever.  HENT:  Positive for rhinorrhea. Negative for sore throat.   Eyes:  Negative for visual disturbance.  Respiratory:  Positive for shortness of breath.   Cardiovascular:  Negative for chest pain.  Gastrointestinal:  Positive for  diarrhea. Negative for abdominal pain, nausea and vomiting.  Genitourinary:  Negative for dysuria.  Musculoskeletal:  Positive for myalgias.  Skin:  Negative for rash.  Neurological:  Negative for headaches.   Physical Exam Updated Vital Signs BP (!) 140/112    Pulse (!) 114    Resp 18    SpO2 97%  Physical Exam Vitals and nursing note reviewed.  Constitutional:      General: He is not in acute distress.    Appearance: Normal appearance. He is well-developed.  HENT:     Head: Normocephalic and atraumatic.  Eyes:     Conjunctiva/sclera: Conjunctivae normal.  Cardiovascular:     Rate and Rhythm: Regular rhythm. Tachycardia present.     Heart sounds: No murmur heard. Pulmonary:     Effort: Pulmonary effort is normal. No respiratory distress.     Breath sounds: Normal breath sounds.  Abdominal:     Palpations: Abdomen is soft.     Tenderness: There is no abdominal tenderness. There is no guarding or rebound.  Musculoskeletal:        General: No swelling or tenderness. Normal range of motion.     Cervical back: Neck supple.  Skin:    General: Skin is warm and dry.     Capillary Refill: Capillary refill takes less than 2 seconds.  Neurological:     General: No focal deficit present.     Mental Status: He is alert.  ED Results / Procedures / Treatments   Labs (all labs ordered are listed, but only abnormal results are displayed) Labs Reviewed  COMPREHENSIVE METABOLIC PANEL - Abnormal; Notable for the following components:      Result Value   Glucose, Bld 107 (*)    AST 46 (*)    All other components within normal limits  RESP PANEL BY RT-PCR (FLU A&B, COVID) ARPGX2  CBC WITH DIFFERENTIAL/PLATELET  ETHANOL  RAPID URINE DRUG SCREEN, HOSP PERFORMED  URINALYSIS, ROUTINE W REFLEX MICROSCOPIC    EKG EKG Interpretation  Date/Time:  Saturday May 10 2021 12:35:31 EST Ventricular Rate:  126 PR Interval:  163 QRS Duration: 96 QT Interval:  327 QTC Calculation: 474 R  Axis:   81 Text Interpretation: Sinus tachycardia Probable left ventricular hypertrophy Anterior ST elevation, probably due to LVH Borderline prolonged QT interval No significant change since prior 7/22 Confirmed by Aletta Edouard [phone]) on 05/10/2021 12:42:36 PM  Radiology DG Chest Port 1 View  Result Date: 05/10/2021 CLINICAL DATA:  Body aches. EXAM: PORTABLE CHEST 1 VIEW COMPARISON:  October 13, 2020. FINDINGS: The heart size and mediastinal contours are within normal limits. Both lungs are clear. The visualized skeletal structures are unremarkable. IMPRESSION: No active disease. Electronically Signed   By: Marijo Conception M.D.   On: 05/10/2021 14:00    Procedures Procedures    Medications Ordered in ED Medications  cloNIDine (CATAPRES) tablet 0.1 mg (0.1 mg Oral Given 05/10/21 1929)    Followed by  cloNIDine (CATAPRES) tablet 0.1 mg (has no administration in time range)    Followed by  cloNIDine (CATAPRES) tablet 0.1 mg (has no administration in time range)  dicyclomine (BENTYL) tablet 20 mg (has no administration in time range)  hydrOXYzine (ATARAX) tablet 25 mg (has no administration in time range)  loperamide (IMODIUM) capsule 2-4 mg (has no administration in time range)  methocarbamol (ROBAXIN) tablet 500 mg (has no administration in time range)  naproxen (NAPROSYN) tablet 500 mg (has no administration in time range)  ondansetron (ZOFRAN-ODT) disintegrating tablet 4 mg (has no administration in time range)  nicotine (NICODERM CQ - dosed in mg/24 hours) patch 21 mg (21 mg Transdermal Patch Applied 05/10/21 1931)  sodium chloride 0.9 % bolus 1,000 mL (0 mLs Intravenous Stopped 05/10/21 1849)    ED Course/ Medical Decision Making/ A&P Clinical Course as of 05/10/21 1931  Sat May 10, 2021  1405 Chest x-ray without acute infiltrates.  Awaiting radiology reading. [MB]  1807 Patient's lab work fairly unremarkable.  I went to discussed discharge with him and now he tells me he wants to  kill himself.  We will place behavioral health consult. [MB]    Clinical Course User Index [MB] Hayden Rasmussen, MD                           Medical Decision Making Amount and/or Complexity of Data Reviewed Labs: ordered. Radiology: ordered.  Risk OTC drugs. Prescription drug management.  Chukwudi Allender was evaluated in Emergency Department on 05/10/2021 for the symptoms described in the history of present illness. He was evaluated in the context of the global COVID-19 pandemic, which necessitated consideration that the patient might be at risk for infection with the SARS-CoV-2 virus that causes COVID-19. Institutional protocols and algorithms that pertain to the evaluation of patients at risk for COVID-19 are in a state of rapid change based on information released by regulatory bodies including the CDC and  federal and state organizations. These policies and algorithms were followed during the patient's care in the ED.  This patient complains of the aches fatigue nasal shin, also complaining of substance abuse, also complaining of auditory hallucinations; this involves an extensive number of treatment Options and is a complaint that carries with it a high risk of complications and Morbidity. The differential includes COVID, flu, viral syndrome, withdrawal, psychiatric illness  I ordered, reviewed and interpreted labs, which included CBC with normal white count normal HEENT globin, chemistries normal other than mildly elevated glucose and AST, alcohol level negative, COVID and flu negative.  Urinalysis and U tox not obtained yet I ordered medication IV fluids, opiate withdrawal medications I ordered imaging studies which included chest x-ray and I independently    visualized and interpreted imaging which showed no acute findings Previous records obtained and reviewed in epic, do not see any recent psychiatric hospitalizations I consulted TTS and discussed lab and imaging findings  Critical  Interventions: None  After the interventions stated above, I reevaluated the patient and found patient to be nontoxic-appearing.  Due to his suicidal threats he will be psychiatric evaluated.  Currently at this time he is not under an IVC.          Final Clinical Impression(s) / ED Diagnoses Final diagnoses:  Substance abuse ([place])  Flu-like symptoms  Suicidal ideation    Rx / DC Orders ED Discharge Orders     None         Hayden Rasmussen, MD 05/10/21 [phone]

## 2021-05-10 NOTE — ED Notes (Addendum)
Pt changed into scrubs and items removed from room. Patient calm and cooperative. Security called to wand patient.

## 2021-05-10 NOTE — ED Triage Notes (Signed)
Pt arrives by [facility] c/o body ahces, malaise, nasal congestion x 1 week. Pt also reports hx schizophrenia and being out of meds x 1 month. Pt endorses auditory hallucinations.  Pt reports last IV drug use yesterday.

## 2021-05-10 NOTE — ED Notes (Signed)
Ginger ale and PRN zofran tablet given to pt per c/o nausea.

## 2021-05-11 ENCOUNTER — Inpatient Hospital Stay (HOSPITAL_COMMUNITY)
Admission: RE | Admit: 2021-05-11 | Discharge: 2021-05-16 | DRG: 885 | Disposition: A | Discharge status: Home / Self Care | Payer: Federal, State, Local not specified - Other | Source: Intra-hospital | Attending: Emergency Medicine | Admitting: Emergency Medicine

## 2021-05-11 ENCOUNTER — Encounter (HOSPITAL_COMMUNITY): Payer: Self-pay | Admitting: Psychiatry

## 2021-05-11 ENCOUNTER — Other Ambulatory Visit: Payer: Self-pay

## 2021-05-11 DIAGNOSIS — F4310 Post-traumatic stress disorder, unspecified: Secondary | ICD-10-CM | POA: Diagnosis present

## 2021-05-11 DIAGNOSIS — Z23 Encounter for immunization: Secondary | ICD-10-CM | POA: Diagnosis not present

## 2021-05-11 DIAGNOSIS — F1123 Opioid dependence with withdrawal: Secondary | ICD-10-CM | POA: Diagnosis present

## 2021-05-11 DIAGNOSIS — F1190 Opioid use, unspecified, uncomplicated: Secondary | ICD-10-CM

## 2021-05-11 DIAGNOSIS — F17200 Nicotine dependence, unspecified, uncomplicated: Secondary | ICD-10-CM | POA: Diagnosis present

## 2021-05-11 DIAGNOSIS — Z818 Family history of other mental and behavioral disorders: Secondary | ICD-10-CM | POA: Diagnosis not present

## 2021-05-11 DIAGNOSIS — Z79899 Other long term (current) drug therapy: Secondary | ICD-10-CM | POA: Diagnosis not present

## 2021-05-11 DIAGNOSIS — Z9114 Patient's other noncompliance with medication regimen: Secondary | ICD-10-CM | POA: Diagnosis not present

## 2021-05-11 DIAGNOSIS — F209 Schizophrenia, unspecified: Secondary | ICD-10-CM | POA: Diagnosis present

## 2021-05-11 DIAGNOSIS — E873 Alkalosis: Secondary | ICD-10-CM | POA: Diagnosis present

## 2021-05-11 DIAGNOSIS — F1510 Other stimulant abuse, uncomplicated: Secondary | ICD-10-CM | POA: Diagnosis present

## 2021-05-11 DIAGNOSIS — F1990 Other psychoactive substance use, unspecified, uncomplicated: Secondary | ICD-10-CM

## 2021-05-11 DIAGNOSIS — F411 Generalized anxiety disorder: Secondary | ICD-10-CM | POA: Diagnosis present

## 2021-05-11 DIAGNOSIS — Z5900 Homelessness unspecified: Secondary | ICD-10-CM

## 2021-05-11 DIAGNOSIS — R000 Tachycardia, unspecified: Secondary | ICD-10-CM

## 2021-05-11 DIAGNOSIS — R7989 Other specified abnormal findings of blood chemistry: Secondary | ICD-10-CM

## 2021-05-11 DIAGNOSIS — I10 Essential (primary) hypertension: Secondary | ICD-10-CM | POA: Diagnosis present

## 2021-05-11 DIAGNOSIS — B1920 Unspecified viral hepatitis C without hepatic coma: Secondary | ICD-10-CM | POA: Diagnosis present

## 2021-05-11 DIAGNOSIS — F410 Panic disorder [episodic paroxysmal anxiety] without agoraphobia: Secondary | ICD-10-CM | POA: Diagnosis present

## 2021-05-11 DIAGNOSIS — F1523 Other stimulant dependence with withdrawal: Secondary | ICD-10-CM | POA: Diagnosis present

## 2021-05-11 DIAGNOSIS — F1110 Opioid abuse, uncomplicated: Secondary | ICD-10-CM | POA: Diagnosis present

## 2021-05-11 DIAGNOSIS — F200 Paranoid schizophrenia: Secondary | ICD-10-CM | POA: Diagnosis present

## 2021-05-11 DIAGNOSIS — F1210 Cannabis abuse, uncomplicated: Secondary | ICD-10-CM | POA: Diagnosis present

## 2021-05-11 MED ORDER — CLONIDINE HCL 0.1 MG PO TABS
0.1000 mg | ORAL_TABLET | Freq: Two times a day (BID) | ORAL | Status: AC
  Administered 2021-05-13 – 2021-05-14 (×4): 0.1 mg via ORAL
  Filled 2021-05-11 (×4): qty 1

## 2021-05-11 MED ORDER — HALOPERIDOL 5 MG PO TABS
5.0000 mg | ORAL_TABLET | Freq: Every day | ORAL | Status: DC
  Administered 2021-05-12: 5 mg via ORAL
  Filled 2021-05-11 (×3): qty 1

## 2021-05-11 MED ORDER — CLONIDINE HCL 0.1 MG PO TABS
0.1000 mg | ORAL_TABLET | Freq: Every day | ORAL | Status: AC
  Administered 2021-05-15 – 2021-05-16 (×2): 0.1 mg via ORAL
  Filled 2021-05-11 (×4): qty 1

## 2021-05-11 MED ORDER — ONDANSETRON 4 MG PO TBDP
4.0000 mg | ORAL_TABLET | Freq: Four times a day (QID) | ORAL | Status: AC | PRN
  Administered 2021-05-11 – 2021-05-14 (×5): 4 mg via ORAL
  Filled 2021-05-11 (×5): qty 1

## 2021-05-11 MED ORDER — WHITE PETROLATUM EX OINT
TOPICAL_OINTMENT | CUTANEOUS | Status: AC
  Filled 2021-05-11: qty 5

## 2021-05-11 MED ORDER — NAPROXEN 250 MG PO TABS
500.0000 mg | ORAL_TABLET | Freq: Two times a day (BID) | ORAL | Status: AC | PRN
  Administered 2021-05-11 – 2021-05-14 (×5): 500 mg via ORAL
  Filled 2021-05-11 (×5): qty 1

## 2021-05-11 MED ORDER — LOPERAMIDE HCL 2 MG PO CAPS: 2.0000 mg | ORAL_CAPSULE | ORAL | Status: AC | PRN

## 2021-05-11 MED ORDER — CLONIDINE HCL 0.1 MG PO TABS
0.1000 mg | ORAL_TABLET | Freq: Four times a day (QID) | ORAL | Status: AC
  Administered 2021-05-11 – 2021-05-12 (×5): 0.1 mg via ORAL
  Filled 2021-05-11 (×7): qty 1

## 2021-05-11 MED ORDER — DICYCLOMINE HCL 20 MG PO TABS
20.0000 mg | ORAL_TABLET | Freq: Four times a day (QID) | ORAL | Status: AC | PRN
  Administered 2021-05-12 – 2021-05-14 (×7): 20 mg via ORAL
  Filled 2021-05-11 (×7): qty 1

## 2021-05-11 MED ORDER — INFLUENZA VAC SPLIT QUAD 0.5 ML IM SUSY
0.5000 mL | PREFILLED_SYRINGE | INTRAMUSCULAR | Status: AC
  Administered 2021-05-12: 0.5 mL via INTRAMUSCULAR
  Filled 2021-05-11: qty 0.5

## 2021-05-11 MED ORDER — METHOCARBAMOL 500 MG PO TABS
500.0000 mg | ORAL_TABLET | Freq: Three times a day (TID) | ORAL | Status: AC | PRN
  Administered 2021-05-11 – 2021-05-14 (×8): 500 mg via ORAL
  Filled 2021-05-11 (×8): qty 1

## 2021-05-11 MED ORDER — HALOPERIDOL 5 MG PO TABS
5.0000 mg | ORAL_TABLET | Freq: Every day | ORAL | Status: DC
  Administered 2021-05-11: 5 mg via ORAL
  Filled 2021-05-11: qty 1

## 2021-05-11 MED ORDER — NICOTINE 21 MG/24HR TD PT24
21.0000 mg | MEDICATED_PATCH | Freq: Every day | TRANSDERMAL | Status: DC
  Administered 2021-05-12 – 2021-05-16 (×5): 21 mg via TRANSDERMAL
  Filled 2021-05-11 (×9): qty 1

## 2021-05-11 MED ORDER — HYDROXYZINE HCL 25 MG PO TABS
25.0000 mg | ORAL_TABLET | Freq: Four times a day (QID) | ORAL | Status: AC | PRN
  Administered 2021-05-11 – 2021-05-14 (×7): 25 mg via ORAL
  Filled 2021-05-11 (×7): qty 1

## 2021-05-11 NOTE — Progress Notes (Signed)
Psychoeducational Group Note  Date:  05/11/2021 Time:  2035  Group Topic/Focus:  Wrap-Up Group:   The focus of this group is to help patients review their daily goal of treatment and discuss progress on daily workbooks.  Participation Level: Did Not Attend  Participation Quality:  Not Applicable  Affect:  Not Applicable  Cognitive:  Not Applicable  Insight:  Not Applicable  Engagement in Group: Not Applicable  Additional Comments:  The patient did not attend group this evening.   [name] S 05/11/2021, 8:35 PM

## 2021-05-11 NOTE — ED Provider Notes (Addendum)
Emergency Medicine Observation Re-evaluation Note  Cody Poole is a 26 y.o. male, seen on rounds today.  Pt initially presented to the ED for complaints of Generalized Body Aches, Fatigue, and Psychiatric Evaluation Currently, the patient is resting comfortably.  Physical Exam  BP (!) 131/92 (BP Location: Right Arm)    Pulse 80    Temp 98.9 F (37.2 C) (Oral)    Resp 16    SpO2 100%  Physical Exam General: No acute distress Cardiac: Well perfused Lungs: Nonlabored Psych: Cooperative  ED Course / MDM  EKG:EKG Interpretation  Date/Time:  Saturday May 10 2021 12:35:31 EST Ventricular Rate:  126 PR Interval:  163 QRS Duration: 96 QT Interval:  327 QTC Calculation: 474 R Axis:   81 Text Interpretation: Sinus tachycardia Probable left ventricular hypertrophy Anterior ST elevation, probably due to LVH Borderline prolonged QT interval No significant change since prior 7/22 Confirmed by Meridee Score [phone]) on 05/10/2021 12:42:36 PM  I have reviewed the labs performed to date as well as medications administered while in observation.  Recent changes in the last 24 hours include TTS evaluation, recommend inpatient.  Plan  Current plan is for inpatient psychiatric bed.  Cody Poole is not under involuntary commitment.  1330.  I was informed that patient's been accepted to behavioral health.  Dr. Sherron Flemings.   Terrilee Files, MD 05/11/21 1112    Terrilee Files, MD 05/11/21 2153

## 2021-05-11 NOTE — ED Notes (Signed)
Pt has repeatedly come to desk saying he is in pain, uncomfortable. RN has given all PRNs available, educated pt that the medicines take time to kick in. Pt resting in stretcher at this time. Received AM meds and breakfast. Denies any further needs outside of current discomfort. No distress noted. RN will continue to monitor.

## 2021-05-11 NOTE — Progress Notes (Signed)
Pt is a 26 year old male who presented voluntarily from [facility] with SI, having a plan to overdose on heroin. Pt reported that he has been using heroin, and meth daily, and endorsed withdrawal symptoms ( chills, nasal congestion, body aches). Pt reported that he has been homeless x 2 months, and has a desire to go "back home" to his mother's place upon discharge. Pt currently denies SI/HI but endorses A/VH (sees monsters, and hears voices). Patient presents with sad affect and calm, cooperative behavior during admission interview and assessment. VS monitored and recorded. Skin assessment performed. Belongings searched and secured in locker. Patient was oriented to unit and schedule. PO fluids provided. Q 15 min checks initiated for safety.Cody Poole

## 2021-05-11 NOTE — ED Notes (Signed)
Pt able to tolerate crackers and water, pt provided with a sandwhich. Pt also provided tissues per request

## 2021-05-11 NOTE — Tx Team (Signed)
Initial Treatment Plan 05/11/2021 7:44 PM [name] [id number]    PATIENT STRESSORS: Financial difficulties   Medication change or noncompliance   Substance abuse     PATIENT STRENGTHS: Average or above average intelligence  Capable of independent living  Communication skills  Physical Health  Supportive family/friends    PATIENT IDENTIFIED PROBLEMS: Suicidal ideation  Substance abuse   "If I don't get help I'm going to kill myself"    "I see monsters and hear voices"             DISCHARGE CRITERIA:  Adequate post-discharge living arrangements Improved stabilization in mood, thinking, and/or behavior Need for constant or close observation no longer present Reduction of life-threatening or endangering symptoms to within safe limits Verbal commitment to aftercare and medication compliance Withdrawal symptoms are absent or subacute and managed without 24-hour nursing intervention  PRELIMINARY DISCHARGE PLAN: Attend 12-step recovery group Outpatient therapy Placement in alternative living arrangements  PATIENT/FAMILY INVOLVEMENT: This treatment plan has been presented to and reviewed with the patient, Cody Poole,  The patient has been given the opportunity to ask questions and make suggestions.  Shela Nevin, RN 05/11/2021, 7:44 PM

## 2021-05-11 NOTE — ED Notes (Signed)
Pt came to nurses station still complaining about 10/10 back pain asking to talk to a Dr. Francene Castle notified

## 2021-05-12 ENCOUNTER — Encounter (HOSPITAL_COMMUNITY): Payer: Self-pay

## 2021-05-12 DIAGNOSIS — F4310 Post-traumatic stress disorder, unspecified: Secondary | ICD-10-CM

## 2021-05-12 DIAGNOSIS — F1210 Cannabis abuse, uncomplicated: Secondary | ICD-10-CM | POA: Diagnosis present

## 2021-05-12 DIAGNOSIS — F1110 Opioid abuse, uncomplicated: Secondary | ICD-10-CM | POA: Diagnosis present

## 2021-05-12 DIAGNOSIS — F1190 Opioid use, unspecified, uncomplicated: Secondary | ICD-10-CM

## 2021-05-12 DIAGNOSIS — F1510 Other stimulant abuse, uncomplicated: Secondary | ICD-10-CM | POA: Diagnosis present

## 2021-05-12 MED ORDER — DIPHENHYDRAMINE HCL 50 MG/ML IJ SOLN
INTRAMUSCULAR | Status: AC
  Administered 2021-05-12: 50 mg via INTRAMUSCULAR
  Filled 2021-05-12: qty 1

## 2021-05-12 MED ORDER — LORAZEPAM 1 MG PO TABS
1.0000 mg | ORAL_TABLET | Freq: Three times a day (TID) | ORAL | Status: DC
  Administered 2021-05-12: 1 mg via ORAL
  Filled 2021-05-12: qty 1

## 2021-05-12 MED ORDER — LORAZEPAM 2 MG/ML IJ SOLN: 1.0000 mg | Freq: Three times a day (TID) | INTRAMUSCULAR | Status: DC

## 2021-05-12 MED ORDER — TRAZODONE HCL 50 MG PO TABS
50.0000 mg | ORAL_TABLET | Freq: Once | ORAL | Status: AC
  Administered 2021-05-12: 50 mg via ORAL
  Filled 2021-05-12 (×2): qty 1

## 2021-05-12 MED ORDER — DIPHENHYDRAMINE HCL 50 MG/ML IJ SOLN: 50.0000 mg | Freq: Four times a day (QID) | INTRAMUSCULAR | Status: DC | PRN

## 2021-05-12 MED ORDER — ACETAMINOPHEN 325 MG PO TABS
650.0000 mg | ORAL_TABLET | Freq: Once | ORAL | Status: AC
Start: 2021-05-12 — End: 2021-05-12
  Administered 2021-05-12: 650 mg via ORAL
  Filled 2021-05-12 (×2): qty 2

## 2021-05-12 MED ORDER — DIPHENHYDRAMINE HCL 50 MG/ML IJ SOLN: 50.0000 mg | Freq: Once | INTRAMUSCULAR | Status: AC

## 2021-05-12 MED ORDER — DIPHENHYDRAMINE HCL 25 MG PO CAPS: 50.0000 mg | ORAL_CAPSULE | Freq: Four times a day (QID) | ORAL | Status: DC | PRN

## 2021-05-12 MED ORDER — EPINEPHRINE 0.3 MG/0.3ML IJ SOAJ: 0.3000 mg | INTRAMUSCULAR | Status: DC | PRN

## 2021-05-12 MED ORDER — OLANZAPINE 5 MG PO TABS
5.0000 mg | ORAL_TABLET | Freq: Every day | ORAL | Status: DC
  Administered 2021-05-12: 5 mg via ORAL
  Filled 2021-05-12 (×2): qty 1

## 2021-05-12 MED ORDER — LORAZEPAM 1 MG PO TABS
1.0000 mg | ORAL_TABLET | Freq: Once | ORAL | Status: AC
  Administered 2021-05-12: 1 mg via ORAL
  Filled 2021-05-12: qty 1

## 2021-05-12 MED ORDER — DIPHENHYDRAMINE HCL 50 MG/ML IJ SOLN
50.0000 mg | Freq: Once | INTRAMUSCULAR | Status: DC
Start: 2021-05-12 — End: 2021-05-16
  Filled 2021-05-12: qty 1

## 2021-05-12 MED ORDER — LORAZEPAM 1 MG PO TABS
1.0000 mg | ORAL_TABLET | Freq: Three times a day (TID) | ORAL | Status: DC
  Administered 2021-05-13 (×2): 1 mg via ORAL
  Filled 2021-05-12 (×2): qty 1

## 2021-05-12 NOTE — H&P (Addendum)
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, who [facility] for stimulant withdrawal symptoms x1 week then admitted Voluntary to [facility] for treatment of command AH and active SI in the setting of recent IVDU.  Mode of transport to Hospital: [facility] Current Medication List: Haldol 5 mg daily PRN medication prior to evaluation: Bentyl, [name], Robaxin, naproxen, Zofran  ED course:  Patient was medically cleared by [facility].   Collateral Information: Cody Poole (mom) [phone]  Patient evaluation was limited due to acute dysarthria and tongue swelling concerning for allergic reaction.  HPI:  Patient stated that he was admitted to [facility] "because I wanted to get off the streets and I lied about having thoughts of suicide, [name], [name], anhedonia, insomnia, decreased energy, decreased concentration, decreased appetite without weight change, and suicidal ideations and intentions for >2 weeks.  Patient reported current episode started about 2 months ago when patient "went to the streets" and stopped taking his home haldol. Prior, he reported that he was living with his mom, step dad, and siblings. Patient declined to elaborate further on reasons for housing instability. Patient declined to give further details on the inciting event that led to his  hospitalization, patient stated that there has been no change, [name], that tells him that he is dying, [name], [name], [name], patient declined to elaborate further.  Patient reported symptoms of mania/hypomania in the past, but during the interview today, he was unable to elaborate on specific symptoms he was having during these episodes, or when his last episode was.    Patient reported a history of PTSD, but did not elaborate further on symptoms clusters of ptsd.    P reports that anxiety and worry are up and down.    Of note, during evaluation, patient complains of acute tongue swelling, requiring IM bendryl. On later encounter, [name], [name], PTSD Suicide attempts: Denied Psychiatric med trials: Haldol 10 mg twice daily.  Patient reported history of [place] Current outpatient psychiatrist: Dr. Loni Muse at [facility] Current outpatient therapist: Denied History of selective adherence: Yes.     Family Psychiatric History: Completed/attempted suicide: Denied Bipolar spectrum disorder: Denied Schizophrenia spectrum disorder: Father Substance abuse: Denied  Substance Abuse History: Alcohol: Rarely, occasional Nicotine: 1 PPD daily since 26 years old Illicit drugs: IVDU-meth, cocaine Rx drug abuse: Denied Seizure hx: Denied Others: Benprenorphine-naloxone (2021)  Social History:  Income: [place]: Homeless   Is the patient at risk to self? Yes.    Has the patient been a risk to self in the past 6 months? No.  Has the patient been a risk to self within the distant past? No.  Is the patient a risk to others? No.  Has the patient been a risk to others in the past 6 months? No.  Has the patient been a risk to others within the distant past? No.   Alcohol Screening:  1. How often do you have a drink containing alcohol?: 4 or more times a week 2. How many drinks containing alcohol do you have on a typical day when you are drinking?: 3 or 4 3. How often do you have six or more drinks on one occasion?: Never AUDIT-C Score: 5 4. How often during the last year have you found that you were not able to stop drinking once you had started?: Never 5. How often during the last year have you failed to do what was normally expected from you because of drinking?: Never 6. How often during the last year have you needed a first drink in the morning to get yourself going after a heavy drinking session?: Never 7. How often during the last year have you had a feeling of guilt of remorse after drinking?: Never 8. How often during the last year have you been unable to remember what happened the night before because you had been drinking?: Never 9. Have you or someone else been injured as a result of your drinking?: No 10. Has a relative or friend or a doctor or another health worker been concerned about your drinking or suggested you cut down?: No Alcohol Use Disorder Identification Test Final Score (AUDIT): 5 Substance Abuse History in the last 12 months: Yes.   Consequences of Substance Abuse: Family Consequences:  homelessness Financial Previous Psychotropic Medications: Yes  Psychological Evaluations: Yes   Past Medical History:  Past Medical History:  Diagnosis Date   Anxiety    Back pain    Depression    Hypertension    Paranoid schizophrenia ([place])     History reviewed.  No pertinent surgical history.  Past Family History:  Family History  Family history unknown: Yes    Tobacco Screening:   Social History   Substance and Sexual Activity  Alcohol Use No     Social History   Substance and Sexual Activity  Drug Use Yes   Types: IV, Cocaine   Comment: Last use 05/09/20     Additional Social History:                         Allergies:   No Known Allergies Lab Results:  Results for orders placed or performed during the hospital encounter of 05/10/21 (from the past 48 hour(s))  Comprehensive metabolic panel     Status: Abnormal   Collection Time: 05/10/21  4:32 PM  Result Value Ref Range   Sodium 139 135 - 145 mmol/L   Potassium 3.9 3.5 - 5.1 mmol/L   Chloride 103 98 - 111 mmol/L   CO2 25 22 - 32 mmol/L   Glucose, Bld 107 (H) 70 - 99 mg/dL    Comment: Glucose reference range applies only to samples taken after fasting for at least 8 hours.   BUN 9 6 - 20 mg/dL   Creatinine, Ser 0.86 0.61 - 1.24 mg/dL   Calcium  9.8 8.9 - 10.3 mg/dL   Total Protein 7.2 6.5 - 8.1 g/dL   Albumin 3.7 3.5 - 5.0 g/dL   AST 46 (H) 15 - 41 U/L   ALT 40 0 - 44 U/L   Alkaline Phosphatase 76 38 - 126 U/L   Total Bilirubin 0.8 0.3 - 1.2 mg/dL   GFR, Estimated >60 >60 mL/min    Comment: (NOTE) Calculated using the CKD-EPI Creatinine Equation (2021)    Anion gap 11 5 - 15    Comment: Performed at [facility] [address] [place], [place] [address]  CBC with Differential     Status: None   Collection Time: 05/10/21  4:32 PM  Result Value Ref Range   WBC 5.3 4.0 - 10.5 K/uL   RBC 5.21 4.22 - 5.81 MIL/uL   Hemoglobin 14.9 13.0 - 17.0 g/dL   HCT 44.3 39.0 - 52.0 %   MCV 85.0 80.0 - 100.0 fL   MCH 28.6 26.0 - 34.0 pg   MCHC 33.6 30.0 - 36.0 g/dL   RDW 12.6 11.5 - 15.5 %   Platelets 368 150 - 400 K/uL   nRBC 0.0 0.0 - 0.2 %   Neutrophils Relative % 78 %   Neutro Abs 4.1 1.7 - 7.7 K/uL   Lymphocytes Relative 16 %   Lymphs Abs 0.8 0.7 - 4.0  K/uL   Monocytes Relative 6 %   Monocytes Absolute 0.3 0.1 - 1.0 K/uL   Eosinophils Relative 0 %   Eosinophils Absolute 0.0 0.0 - 0.5 K/uL   Basophils Relative 0 %   Basophils Absolute 0.0 0.0 - 0.1 K/uL   Immature Granulocytes 0 %   Abs Immature Granulocytes 0.02 0.00 - 0.07 K/uL    Comment: Performed at [facility], [address], [place], [place] [address]  Ethanol     Status: None   Collection Time: 05/10/21  4:32 PM  Result Value Ref Range   Alcohol, Ethyl (B) <10 <10 mg/dL    Comment: (NOTE) Lowest detectable limit for serum alcohol is 10 mg/dL.  For medical purposes only. Performed at [facility], [place] [address], [place], [place] [address]   Urine rapid drug screen (hosp performed)     Status: Abnormal   Collection Time: 05/10/21 11:18 PM  Result Value Ref Range   Opiates NONE DETECTED NONE DETECTED   Cocaine POSITIVE (A) NONE DETECTED   Benzodiazepines NONE DETECTED NONE DETECTED   Amphetamines POSITIVE (A) NONE DETECTED   Tetrahydrocannabinol POSITIVE (A) NONE DETECTED   Barbiturates NONE DETECTED NONE DETECTED    Comment: (NOTE) DRUG SCREEN FOR MEDICAL PURPOSES ONLY.  IF CONFIRMATION IS NEEDED FOR ANY PURPOSE, NOTIFY LAB WITHIN 5 DAYS.  LOWEST DETECTABLE LIMITS FOR URINE DRUG SCREEN Drug Class                     Cutoff (ng/mL) Amphetamine and metabolites    1000 Barbiturate and metabolites    200 Benzodiazepine                 [id number] Tricyclics and metabolites     300 Opiates and metabolites        300 Cocaine and metabolites        300 THC                            50 Performed at [facility], [place]  [address] [place], [place] [address]   Urinalysis, Routine w reflex microscopic     Status: Abnormal   Collection Time: 05/10/21 11:18 PM  Result Value Ref Range   Color, Urine YELLOW YELLOW   APPearance CLEAR CLEAR   Specific Gravity, Urine 1.020 1.005 - 1.030   pH 7.0 5.0 - 8.0   Glucose, UA NEGATIVE NEGATIVE mg/dL   Hgb urine  dipstick NEGATIVE NEGATIVE   Bilirubin Urine NEGATIVE NEGATIVE   Ketones, ur NEGATIVE NEGATIVE mg/dL   Protein, ur 100 (A) NEGATIVE mg/dL   Nitrite NEGATIVE NEGATIVE   Leukocytes,Ua NEGATIVE NEGATIVE   RBC / HPF 0-5 0 - 5 RBC/hpf   WBC, UA 0-5 0 - 5 WBC/hpf   Bacteria, UA NONE SEEN NONE SEEN   Squamous Epithelial / LPF 0-5 0 - 5   Mucus PRESENT     Comment: Performed at [facility] [address], [place], [place] [address]   Blood Alcohol level:  Lab Results  Component Value Date   ETH <10 05/10/2021   ETH <10 [id number]   Metabolic Disorder Labs:  No results found for: HGBA1C, MPG No results found for: PROLACTIN No results found for: CHOL, TRIG, HDL, CHOLHDL, VLDL, LDLCALC  Current Medications: Current Facility-Administered Medications  Medication Dose Route Frequency Provider Last Rate Last Admin   cloNIDine (CATAPRES) tablet 0.1 mg  0.1 mg Oral QID Merlyn Lot E, NP   0.1 mg at 05/12/21 1203   Followed by   Derrill Memo ON 05/13/2021] cloNIDine (CATAPRES) tablet 0.1 mg  0.1 mg Oral BID Mallie Darting, NP       Followed by   Derrill Memo ON 05/15/2021] cloNIDine (CATAPRES) tablet 0.1 mg  0.1 mg Oral Daily Merlyn Lot E, NP       dicyclomine (BENTYL) tablet 20 mg  20 mg Oral Q6H PRN Merlyn Lot E, NP   20 mg at 05/12/21 1523   diphenhydrAMINE (BENADRYL) capsule 50 mg  50 mg Oral Q6H PRN Merrily Brittle, DO       Or   diphenhydrAMINE (BENADRYL) injection 50 mg  50 mg Intramuscular Q6H PRN Merrily Brittle, DO       diphenhydrAMINE (BENADRYL) injection 50 mg  50 mg Intravenous Once Shirlene Andaya, Ovid Curd, MD       EPINEPHrine (EPI-PEN) injection 0.3 mg  0.3 mg Intramuscular PRN Merrily Brittle, DO       hydrOXYzine (ATARAX) tablet 25 mg  25 mg Oral Q6H PRN Merlyn Lot E, NP   25 mg at 05/12/21 1522   loperamide (IMODIUM) capsule 2-4 mg  2-4 mg Oral PRN Mallie Darting, NP       methocarbamol (ROBAXIN) tablet 500 mg  500 mg Oral Q8H PRN Merlyn Lot E, NP   500 mg at 05/12/21 0856    naproxen (NAPROSYN) tablet 500 mg  500 mg Oral BID PRN Merlyn Lot E, NP   500 mg at 05/12/21 [id number]   nicotine (NICODERM CQ - dosed in mg/24 hours) patch 21 mg  21 mg Transdermal Daily Merlyn Lot E, NP   21 mg at 05/12/21 0852   OLANZapine (ZYPREXA) tablet 5 mg  5 mg Oral QHS Jette Lewan, MD       ondansetron (ZOFRAN-ODT) disintegrating tablet 4 mg  4 mg Oral Q6H PRN Merlyn Lot E, NP   4 mg at 05/12/21 1523   PTA Medications: Medications Prior to Admission  Medication Sig Dispense Refill Last Dose   haloperidol (HALDOL) 10 MG tablet Take 1 tablet (10  mg total) by mouth 2 (two) times daily. 60 tablet 0    methocarbamol (ROBAXIN) 500 MG tablet Take 1 tablet (500 mg total) by mouth 2 (two) times daily. (Patient not taking: Reported on 05/11/2021) 20 tablet 0     Musculoskeletal: Strength & Muscle Tone: within normal limits Gait & Station: normal Patient leans: N/A  Psychiatric Specialty Exam: Presentation General Appearance: Bizarre; [name], [name]; [name]; Restricted   Thought Process  Thought Processes:Coherent; Goal Directed; Linear  Duration of Psychotic Symptoms: Greater than six months  Past Diagnosis of Schizophrenia or Psychoactive disorder: Yes   Descriptions of Associations:Tangential  Orientation:Full (Time, Place and Person)   Thought Content:Scattered; [name], delusions, paranoia.)  Hallucinations:Hallucinations: None  Ideas of Reference:None   Suicidal Thoughts:Suicidal Thoughts: reports suicidal thoughts have decreased since admission.   Homicidal Thoughts:Homicidal Thoughts: No   Sensorium  Memory:Immediate Fair; Recent Fair; Remote Poor  Judgment:Intact  Insight:Present   Executive Functions   Concentration:Fair  Attention Span:Fair  [place]   Psychomotor Activity  Psychomotor Activity:Psychomotor Activity: -- (Tongue fasciculations) AIMS Completed?: No   Assets  Assets:Desire for Improvement; Leisure Time   Sleep  Sleep:Sleep: Poor   Physical Exam: Body mass index is 22.78 kg/m. Blood pressure 128/76, pulse 87, temperature 98.2 F (36.8 C), temperature source Oral, resp. rate 16, height 6' (1.829 m), weight 76.2 kg, SpO2 100 %.   Physical Exam Vitals and nursing note reviewed.  Constitutional:      General: He is awake. He is not in acute distress.    Appearance: He is not ill-appearing or diaphoretic.  HENT:     Head: Normocephalic.  Eyes:     Comments: Mild periorbital edema  Pulmonary:     Effort: Pulmonary effort is normal. No respiratory distress.  Skin:    Findings: Lesion (face) present.  Neurological:     General: No focal deficit present.     Mental Status: He is alert.     Cranial Nerves: No cranial nerve deficit.     Motor: No weakness.     Gait: Gait normal.  Psychiatric:        Attention and Perception: He does not perceive auditory or visual hallucinations.        Mood and Affect: Mood is anxious and depressed.        Speech: Speech is rapid and pressured and slurred.        Behavior: Behavior is hyperactive. Behavior is cooperative.        Thought Content: Thought content does not include homicidal or suicidal ideation. Thought content does not include homicidal or suicidal plan.        Cognition and Memory: Memory is impaired.    Review of Systems  Constitutional:  Positive for diaphoresis. Negative for chills and fever.  HENT:  Positive for congestion.        Tongue swelling  Respiratory:  Negative for cough, shortness of breath and wheezing.   Cardiovascular:  Negative for chest pain and palpitations.  Gastrointestinal:  Positive for abdominal pain and diarrhea.  Genitourinary:   Negative for dysuria.  Musculoskeletal:  Positive for myalgias.  Neurological:  Negative for dizziness, tingling, tremors, focal weakness, seizures, weakness and headaches.  Psychiatric/Behavioral:  Positive for depression, hallucinations, memory loss, substance abuse and suicidal ideas. The patient is nervous/anxious and has  insomnia.   All other systems reviewed and are negative.  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  Reported AVH since upping his medication, however denied AVH today.  Home medications include Haldol 10 mg twice daily, during arrival to [facility] he was started on Haldol 5 mg daily. This resulted in subjectively swollen tongue and dysarthria that was concerning for medication hypersensitivity, which was resolved with IM Benadryl.  We will switch out Haldol for a different antipsychotic. Discontinued home Haldol 5 mg daily Started Zyprexa 5 mg nightly  Polysubstance use (stimulant, cannabis, tobacco) Patient presented with symptoms of stimulant with draw and admitted to IVDU. UDS (+) amphetamines, cannabis, cocaine. PRN's provided for symptomatic relief. Will inquire if patient is amenable to HIV  We will inquire if patient is amenable to STI testing Started clonidine 0.1 mg BID along with other opiate withdrawal PRNS  Hypocalcemia iCa2+ 0.68, likely 2/2 respiratory alkalosis evident by decreased lactic acid and CO2 in the setting of likely hyperventilation from chronic stimulant abuse. Total Ca2+ and albumin WNL.   Dispo: PCP: Pcp, No  Unable to obtain collateral from Chandan Beaulieu (mom) [phone] x2 (1/30). Will attempt again.   Daily contact with patient to assess and evaluate symptoms and progress in treatment and Medication  management  Observation Level/Precautions:  15 minute checks  Laboratory:  Labs reviewed  Psychotherapy:  Unit group sessions and supportive treatment  Medications:  See [facility]  Consultations:  To be determined   Discharge Concerns:  Safety, medication compliance, mood stability  Estimated LOS: 5-7 days  Other:  N/A   Long Term Goal(s): Improvement in symptoms so as ready for discharge  Short Term Goals: Ability to identify changes in lifestyle to reduce recurrence of condition will improve, Ability to verbalize feelings will improve, Ability to disclose and discuss suicidal ideas, Ability to demonstrate self-control will improve, Ability to identify and develop effective coping behaviors will improve, Ability to maintain clinical measurements within normal limits will improve, Compliance with prescribed medications will improve, and Ability to identify triggers associated with substance abuse/mental health issues will improve  I certify that inpatient services furnished can reasonably be expected to improve the patient's condition.    Signed: Merrily Brittle, DO Psychiatry Resident, PGY-1 [facility] 05/12/2021, 4:19 PM     Total Time Spent in Direct Patient Care:  I personally spent 60 minutes on the unit in direct patient care. The direct patient care time included face-to-face time with the patient, reviewing the patient's chart, communicating with other professionals, and coordinating care. Greater than 50% of this time was spent in counseling or coordinating care with the patient regarding goals of hospitalization, psycho-education, and discharge planning needs.  I have independently evaluated the patient during a face-to-face assessment on 05/12/21. I reviewed the patient's chart, and I participated in key portions of the service. I discussed the case with the [employer], and I agree with the assessment and plan of care as documented in the House Officer's note, as addended by me or  notated below:  I directly edited the note, as above. Diagnosis might be schizoaffective disorder, bipolar type, but will need to have patient elaborate more on symptom to confirm this or keep diagnosis as schizophrenia.   Also add diagnosis of opiate  use disorder and nicotine use disorder and h/o adhd and h/o ptsd.     Janine Limbo, MD Psychiatrist
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
## 2021-05-12 NOTE — Progress Notes (Signed)
°   05/12/21 2115  Psych Admission Type (Psych Patients Only)  Admission Status Voluntary  Psychosocial Assessment  Patient Complaints Substance abuse;Anxiety  Eye Contact Fair  Facial Expression Flat  Affect Blunted;Anxious  Speech International aid/development worker;Restless  Appearance/Hygiene Disheveled  Behavior Characteristics Cooperative;Anxious  Mood Anxious;Depressed  Thought Process  Coherency WDL  Content WDL  Delusions None reported or observed  Perception Hallucinations  Hallucination Auditory;Command (sees and hears aliens)  Judgment Impaired  Confusion None  Danger to Self  Current suicidal ideation? Denies  Danger to Others  Danger to Others None reported or observed   Pt seen after wrap up group this evening. Denies SI, HI. Pt endorses AVH: "I see aliens and I hear aliens." Pt states he has had this hallucination for a while. Pt rates pain 10/10 in his feet. Pt rates anxiety 9/10 and denies depression. Pt has a flat affect and periorbital edema around his eyes. Pt appears sleepy but is answering questions appropriately. Endorses the following withdrawal symptoms: aches, restlessness, stomach cramps, anxiety. Pt given appropriate PRNs.

## 2021-05-12 NOTE — Group Note (Incomplete)
Date:  05/12/2021 Time:  9:39 AM  Group Topic/Focus:  Goals Group:   The focus of this group is to help patients establish daily goals to achieve during treatment and discuss how the patient can incorporate goal setting into their daily lives to aide in recovery. Orientation:   The focus of this group is to educate the patient on the purpose and policies of crisis stabilization and provide a format to answer questions about their admission.  The group details unit policies and expectations of patients while admitted.    Participation Level:  {BHH PARTICIPATION HD:996081  Participation Quality:  {B[facility] PARTICIPATION QUALITY:22265}  Affect:  {BHH AFFECT:22266}  Cognitive:  {[facility] COGNITIVE:22267}  Insight: {BHH Insight2:20797}  Engagement in Group:  {BHH ENGAGEMENT IN JY:[id number]  Modes of Intervention:  {B[facility] MODES OF INTERVENTION:22269}  Additional Comments:  ***  Garvin Fila 05/12/2021, 9:39 AM

## 2021-05-12 NOTE — Progress Notes (Addendum)
D:  Patient denied SI and HI, contracts for safety.  Denied A/V hallucinations. A:  Medications administered per MD orders.  Emotional support and encouragement given patient. R:  Safety maintained with 15 minute checks.  Patient has put his mattress on the floor and has slept on his mattress most of the day.  Fluids and meals have been given patient throughout the day.   PRN medications also given.

## 2021-05-12 NOTE — Progress Notes (Signed)
°   05/11/21 2200  Psych Admission Type (Psych Patients Only)  Admission Status Voluntary  Psychosocial Assessment  Patient Complaints Anxiety;Isolation  Eye Contact [place]  Facial Expression Blank  Affect Blunted  Speech Logical/coherent  Interaction Assertive  Motor Activity Slow;Tremors  Appearance/Hygiene Unremarkable  Behavior Characteristics Appropriate to situation;Cooperative  Mood Pleasant  Thought Process  Coherency WDL  Content WDL  Delusions None reported or observed  Perception WDL  Hallucination None reported or observed  Judgment Impaired  Danger to Self  Current suicidal ideation? Denies  Danger to Others  Danger to Others None reported or observed

## 2021-05-12 NOTE — BH IP Treatment Plan (Signed)
Interdisciplinary Treatment and Diagnostic Plan Update  05/12/2021 Time of Session: 9:45am [facility] MRN: [id number]  Principal Diagnosis: Schizophrenia [facility])  Secondary Diagnoses: Principal Problem:   Schizophrenia ([place])   Current Medications:  Current Facility-Administered Medications  Medication Dose Route Frequency Provider Last Rate Last Admin   cloNIDine (CATAPRES) tablet 0.1 mg  0.1 mg Oral QID Merlyn Lot E, NP   0.1 mg at 05/12/21 2694   Followed by   Derrill Memo ON 05/13/2021] cloNIDine (CATAPRES) tablet 0.1 mg  0.1 mg Oral BID Mallie Darting, NP       Followed by   Derrill Memo ON 05/15/2021] cloNIDine (CATAPRES) tablet 0.1 mg  0.1 mg Oral Daily Merlyn Lot E, NP       dicyclomine (BENTYL) tablet 20 mg  20 mg Oral Q6H PRN Merlyn Lot E, NP   20 mg at 05/12/21 0857   haloperidol (HALDOL) tablet 5 mg  5 mg Oral Daily Merlyn Lot E, NP   5 mg at 05/12/21 0851   hydrOXYzine (ATARAX) tablet 25 mg  25 mg Oral Q6H PRN Merlyn Lot E, NP   25 mg at 05/12/21 0856   influenza vac split quadrivalent PF (FLUARIX) injection 0.5 mL  0.5 mL Intramuscular Tomorrow-1000 [place], Jackie Plum, MD       loperamide (IMODIUM) capsule 2-4 mg  2-4 mg Oral PRN Mallie Darting, NP       methocarbamol (ROBAXIN) tablet 500 mg  500 mg Oral Q8H PRN Merlyn Lot E, NP   500 mg at 05/12/21 0856   naproxen (NAPROSYN) tablet 500 mg  500 mg Oral BID PRN Mallie Darting, NP   500 mg at 05/12/21 8546   nicotine (NICODERM CQ - dosed in mg/24 hours) patch 21 mg  21 mg Transdermal Daily Merlyn Lot E, NP   21 mg at 05/12/21 0852   ondansetron (ZOFRAN-ODT) disintegrating tablet 4 mg  4 mg Oral Q6H PRN Mallie Darting, NP   4 mg at 05/12/21 2703   PTA Medications: Medications Prior to Admission  Medication Sig Dispense Refill Last Dose   haloperidol (HALDOL) 10 MG tablet Take 1 tablet (10 mg total) by mouth 2 (two) times daily. 60 tablet 0    methocarbamol (ROBAXIN) 500 MG tablet Take 1 tablet (500 mg total) by  mouth 2 (two) times daily. (Patient not taking: Reported on 05/11/2021) 20 tablet 0     Patient Stressors: Financial difficulties   Medication change or noncompliance   Substance abuse    Patient Strengths: Average or above average intelligence  Capable of independent living  Communication skills  Physical Health  Supportive family/friends   Treatment Modalities: Medication Management, Group therapy, Case management,  1 to 1 session with clinician, Psychoeducation, Recreational therapy.   Physician Treatment Plan for Primary Diagnosis: Schizophrenia ([place]) Long Term Goal(s):     Short Term Goals:    Medication Management: Evaluate patient's response, side effects, and tolerance of medication regimen.  Therapeutic Interventions: 1 to 1 sessions, Unit Group sessions and Medication administration.  Evaluation of Outcomes: Not Met  Physician Treatment Plan for Secondary Diagnosis: Principal Problem:   Schizophrenia ([place])  Long Term Goal(s):     Short Term Goals:       Medication Management: Evaluate patient's response, side effects, and tolerance of medication regimen.  Therapeutic Interventions: 1 to 1 sessions, Unit Group sessions and Medication administration.  Evaluation of Outcomes: Not Met   RN Treatment Plan for Primary Diagnosis: Schizophrenia ([place]) Long Term Goal(s): Knowledge  of disease and therapeutic regimen to maintain health will improve  Short Term Goals: Ability to remain free from injury will improve, Ability to verbalize frustration and anger appropriately will improve, Ability to demonstrate self-control, Ability to participate in decision making will improve, Ability to verbalize feelings will improve, Ability to identify and develop effective coping behaviors will improve, and Compliance with prescribed medications will improve  Medication Management: RN will administer medications as ordered by provider, will assess and evaluate patient's response and  provide education to patient for prescribed medication. RN will report any adverse and/or side effects to prescribing provider.  Therapeutic Interventions: 1 on 1 counseling sessions, Psychoeducation, Medication administration, Evaluate responses to treatment, Monitor vital signs and CBGs as ordered, Perform/monitor CIWA, COWS, AIMS and Fall Risk screenings as ordered, Perform wound care treatments as ordered.  Evaluation of Outcomes: Not Met   LCSW Treatment Plan for Primary Diagnosis: Schizophrenia ([place]) Long Term Goal(s): Safe transition to appropriate next level of care at discharge, Engage patient in therapeutic group addressing interpersonal concerns.  Short Term Goals: Engage patient in aftercare planning with referrals and resources, Increase social support, Increase ability to appropriately verbalize feelings, Increase emotional regulation, Facilitate acceptance of mental health diagnosis and concerns, Identify triggers associated with mental health/substance abuse issues, and Increase skills for wellness and recovery  Therapeutic Interventions: Assess for all discharge needs, 1 to 1 time with Social worker, Explore available resources and support systems, Assess for adequacy in community support network, Educate family and significant other(s) on suicide prevention, Complete Psychosocial Assessment, Interpersonal group therapy.  Evaluation of Outcomes: Not Met   Progress in Treatment: Attending groups: Yes. Participating in groups: Yes. Taking medication as prescribed: Yes. Toleration medication: Yes. Family/Significant other contact made: No, will contact:  if consent is provided Patient understands diagnosis: No. Discussing patient identified problems/goals with staff: Yes. Medical problems stabilized or resolved: Yes. Denies suicidal/homicidal ideation: Yes. Issues/concerns per patient self-inventory: No.   New problem(s) identified: No, Describe:  none  New Short  Term/Long Term Goal(s):  medication stabilization, elimination of SI thoughts, development of comprehensive mental wellness plan.    Patient Goals:  "Get on medications"  Discharge Plan or Barriers: Patient recently admitted. CSW will continue to follow and assess for appropriate referrals and possible discharge planning.    Reason for Continuation of Hospitalization: Hallucinations Mania Medication stabilization  Estimated Length of Stay: 3-5 days   Scribe for Treatment Team: Vassie Moselle, LCSW 05/12/2021 10:33 AM

## 2021-05-12 NOTE — Group Note (Signed)
Recreation Therapy Group Note   Group Topic:Stress Management  Group Date: 05/12/2021 Start Time: 0935 End Time: 0952 Facilitators: Caroll Rancher, [place] Location: [address] Dayroom   Goal Area(s) Addresses:  Patient will identify positive stress management techniques. Patient will identify benefits of using stress management post d/c.  Group Description:  Meditation.  LRT played a meditation that focused on taking good intentions and positive thoughts into your day.  Patients were to listen and follow along as the meditation played to fully engage in the activity.     Affect/Mood: N/A   Participation Level: Did not attend    Clinical Observations/Individualized Feedback:    Plan: Continue to engage patient in RT group sessions 2-3x/week.   Caroll Rancher, LRT,CTRS 05/12/2021 12:06 PM

## 2021-05-12 NOTE — [facility] Group Notes (Signed)
Psychoeducational Group- Pts were read ''There's a hole in my sidewalk'' pt [facility]. Pts were asked to identify negative behavioral patterns as it relates to their mental health. Pt did not attend.  °

## 2021-05-12 NOTE — Group Note (Signed)
°  Type of Therapy and Topic:  Group Therapy:  Healthy and Unhealthy Supports  Participation Level:  Did Not Attend   Description of Group:  Patients in this group were introduced to the idea of adding a variety of healthy supports to address the various needs in their lives.Patients discussed what additional healthy supports could be helpful in their recovery and wellness after discharge in order to prevent future hospitalizations.   An emphasis was placed on using counselor, doctor, therapy groups, 12-step groups, and problem-specific support groups to expand supports.  They also worked as a group on developing a specific plan for several patients to deal with unhealthy supports through boundary-setting, psychoeducation with loved ones, and even termination of relationships.   Therapeutic Goals:   1)  discuss importance of adding supports to stay well once out of the hospital  2)  compare healthy versus unhealthy supports and identify some examples of each  3)  generate ideas and descriptions of healthy supports that can be added  4)  offer mutual support about how to address unhealthy supports  5)  encourage active participation in and adherence to discharge plan    Summary of Patient Progress:  Did not attend   Therapeutic Modalities:   Motivational Interviewing Brief Solution-Focused Therapy  Gardiner Sleeper Giomar Gusler, LCSW 05/12/2021  1:44 PM

## 2021-05-12 NOTE — Plan of Care (Signed)
Nurse discussed anxiety, depression and coping skills with patient.  

## 2021-05-12 NOTE — Hospital Course (Addendum)
Cody Poole is a 26 y.o. male with PPHx of schizophrenia, who presented to [facility] ED for stimulant withdrawal symptoms x1 week then admitted Voluntary to [facility] for treatment of command AH and active SI in the setting of recent IVDU.  Collateral Information: Zaivion Kundrat (mom) [phone]  Psychiatric diagnoses provided upon initial assessment: Principal Problem:   Schizophrenia (HCC) Active Problems:   Stimulant abuse (HCC)   Cannabis abuse   Opioid use disorder   PTSD (post-traumatic stress disorder)  Psychiatric diagnoses provided upon discharge: Principal Problem:   Schizophrenia (HCC) Active Problems:   Stimulant abuse (HCC)   Cannabis abuse   Opioid use disorder   PTSD (post-traumatic stress disorder)   IVDU (intravenous drug user)   Patient's psychiatric medications were adjusted on admission:  Home med per chart: Haldol 10 mg BID Started then discontinued Haldol 5 mg given tongue swelling Discontinued home Haldol 5 mg daily Started Zyprexa 5 mg nightly Started clonidine 0.1 mg BID along with other opiate withdrawal PRNS  During the hospitalization, other adjustments were made to the patient's psychiatric medication regimen:  Zyprexa was increased to 10 mg nightly Started Gabapentin 300 mg 3 times daily Started metoprolol and increased to 25 mg twice daily for sinus tachycardia Started doxycycline 100 mg twice daily x7 days for cellulitis  Gradually, patient started adjusting to milieu.   Patient's care was discussed during the interdisciplinary team meeting every day during the hospitalization.   The patient had allergic reaction to haldol that resovled with IM benadryl. He otherwise denies having side effects to prescribed psychiatric medication.   The patient reports their target psychiatric symptoms of command auditory hallucinations and suicidal thoughts, all responded well to the psychiatric medications, and the patient reports overall benefit other  psychiatric hospitalization. Supportive psychotherapy was provided to the patient. The patient also participated in regular group therapy while admitted.    Labs were reviewed with the patient, and abnormal results were discussed with the patient.   The patient denied having suicidal thoughts more than 48 hours prior to discharge.  Patient denies having homicidal thoughts.   Pt reports that AH are quieter and not commanding. He reports that AH are supportive and at baseline. He reports VH are at baseline. Patient denies having paranoid thoughts.   The patient is able to verbalize their individual safety plan to this provider.   It is recommended to the patient to continue psychiatric medications as prescribed, after discharge from the hospital.     It is recommended to the patient to follow up with your outpatient psychiatric provider and PCP.   Discussed with the patient, the impact of alcohol, drugs, tobacco have been there overall psychiatric and medical wellbeing, and total abstinence from substance use was recommended the patient.

## 2021-05-12 NOTE — Group Note (Signed)
Date:  05/12/2021 Time:  9:34 AM  Group Topic/Focus:  Goals Group:   The focus of this group is to help patients establish daily goals to achieve during treatment and discuss how the patient can incorporate goal setting into their daily lives to aide in recovery. Orientation:   The focus of this group is to educate the patient on the purpose and policies of crisis stabilization and provide a format to answer questions about their admission.  The group details unit policies and expectations of patients while admitted.    Participation Level:  Active  Participation Quality:  Appropriate  Affect:  Appropriate  Cognitive:  Appropriate  Insight: Appropriate  Engagement in Group:  Engaged  Modes of Intervention:  Discussion  Additional Comments:  Pt wants to talk to Dr about discharge  Jaquita Rector 05/12/2021, 9:34 AM

## 2021-05-12 NOTE — Group Note (Signed)
Date:  05/12/2021 Time:  10:13 AM  Group Topic/Focus:  Goals Group:   The focus of this group is to help patients establish daily goals to achieve during treatment and discuss how the patient can incorporate goal setting into their daily lives to aide in recovery. Orientation:   The focus of this group is to educate the patient on the purpose and policies of crisis stabilization and provide a format to answer questions about their admission.  The group details unit policies and expectations of patients while admitted.    Participation Level:  Minimal  Participation Quality:  Appropriate  Affect:  Appropriate  Cognitive:  Appropriate  Insight: Appropriate  Engagement in Group:  Engaged  Modes of Intervention:  Discussion  Additional Comments:  Pt wants to talk to Dr about discharge  Jaquita Rector 05/12/2021, 10:13 AM

## 2021-05-13 LAB — HIV ANTIBODY (ROUTINE TESTING W REFLEX): HIV Screen 4th Generation wRfx: NONREACTIVE

## 2021-05-13 LAB — HEPATIC FUNCTION PANEL
ALT: 26 U/L (ref 0–44)
AST: 26 U/L (ref 15–41)
Albumin: 3.8 g/dL (ref 3.5–5.0)
Alkaline Phosphatase: 64 U/L (ref 38–126)
Bilirubin, Direct: 0.1 mg/dL (ref 0.0–0.2)
Indirect Bilirubin: 0.5 mg/dL (ref 0.3–0.9)
Total Bilirubin: 0.6 mg/dL (ref 0.3–1.2)
Total Protein: 7 g/dL (ref 6.5–8.1)

## 2021-05-13 LAB — HEPATITIS PANEL, ACUTE
HCV Ab: REACTIVE — AB
Hep A IgM: NONREACTIVE
Hep B C IgM: NONREACTIVE
Hepatitis B Surface Ag: NONREACTIVE

## 2021-05-13 MED ORDER — ENSURE ENLIVE PO LIQD
237.0000 mL | Freq: Two times a day (BID) | ORAL | Status: DC
  Administered 2021-05-13 – 2021-05-16 (×5): 237 mL via ORAL
  Filled 2021-05-13 (×12): qty 237

## 2021-05-13 MED ORDER — OLANZAPINE 5 MG PO TBDP
5.0000 mg | ORAL_TABLET | Freq: Three times a day (TID) | ORAL | Status: DC | PRN
  Filled 2021-05-13: qty 1

## 2021-05-13 MED ORDER — ZIPRASIDONE MESYLATE 20 MG IM SOLR: 20.0000 mg | INTRAMUSCULAR | Status: DC | PRN

## 2021-05-13 MED ORDER — LORAZEPAM 1 MG PO TABS: 1.0000 mg | ORAL_TABLET | ORAL | Status: DC | PRN

## 2021-05-13 MED ORDER — SALINE SPRAY 0.65 % NA SOLN
1.0000 | NASAL | Status: DC | PRN
  Administered 2021-05-14: 1 via NASAL
  Filled 2021-05-13: qty 44

## 2021-05-13 MED ORDER — OLANZAPINE 10 MG PO TABS
10.0000 mg | ORAL_TABLET | Freq: Every day | ORAL | Status: DC
  Administered 2021-05-13 – 2021-05-15 (×3): 10 mg via ORAL
  Filled 2021-05-13 (×7): qty 1

## 2021-05-13 NOTE — Progress Notes (Signed)
UA container given to patient.  

## 2021-05-13 NOTE — Progress Notes (Signed)
Psychoeducational Group Note  Date:  05/13/2021 Time:  2239  Group Topic/Focus:  Wrap-Up Group:   The focus of this group is to help patients review their daily goal of treatment and discuss progress on daily workbooks.  Participation Level: Did Not Attend  Participation Quality:  Not Applicable  Affect:  Not Applicable  Cognitive:  Not Applicable  Insight:  Not Applicable  Engagement in Group: Not Applicable  Additional Comments:  The patient did not attend group this evening.   Hazle Coca S 05/13/2021, 10:39 PM

## 2021-05-13 NOTE — Progress Notes (Signed)
Patient stated he does see demons and hears voices.  Denied SI and HI, contracts for safety.  Respirations even and unlabored.  No signs/symptoms of pain/distress noted on patient's face/body movements.  Safety maintained with 15 minute checks.

## 2021-05-13 NOTE — [facility] Suicide Risk Assessment (Signed)
[facility] INPATIENT:  Family/Significant Other Suicide Prevention Education  Suicide Prevention Education:  Education Completed; Pavle Wiler, [phone],  (name of family member/significant other) has been identified by the patient as the family member/significant other with whom the patient will be residing, and identified as the person(s) who will aid the patient in the event of a mental health crisis (suicidal ideations/suicide attempt).  With written consent from the patient, the family member/significant other has been provided the following suicide prevention education, prior to the and/or following the discharge of the patient.  CSW spoke with mother and she reports that patient is an addict and also has mental health issues.  Mother reports that his father had schizophrenia and that they have an extensive family history of addiction.  Mother reports that she was an addict and now she is sober for 15 years.  Patient currently gets mental health services from [facility] in [place] and is on their sliding scale.  Patient also has started the process of applying for disability but he disappears when they have an appointment scheduled so mother is unable to speak for him on his behalf.  Mother is also working on getting him connected with her family physician to prescribe suboxone for patient.  Mother reports that patient is kind, sweet and able to function when he is taking and stabilized on medications. Patient has not been taking medications recently.  Patient can return home but mother reports that he is to stay with her.  Patient has a girlfriend that takes care of him but her sons are dealers and is not the best environment for his recovery.  No guns/weapons in the house and no additional safety concerns.   The suicide prevention education provided includes the following: Suicide risk factors Suicide prevention and interventions National Suicide Hotline telephone number [facility] assessment telephone number [facility] Emergency Assistance 911 [facility] and/or Residential Mobile Crisis Unit telephone number  Request made of family/significant other to: Remove weapons (e.g., guns, rifles, knives), all items previously/currently identified as safety concern.   Remove drugs/medications (over-the-counter, prescriptions, illicit drugs), all items previously/currently identified as a safety concern.  The family member/significant other verbalizes understanding of the suicide prevention education information provided.  The family member/significant other agrees to remove the items of safety concern listed above.  Arik Husmann E Adamaris King 05/13/2021, 1:28 PM

## 2021-05-13 NOTE — Progress Notes (Signed)
Pt up again saying that he can't sleep d/t muscle cramps. Pt informed that he was given PRN for muscle cramps with evening medication. Provider notified and given one-time dose of Ativan 1 mg. Will continue to monitor.

## 2021-05-13 NOTE — Plan of Care (Signed)
Nurse discussed coping skills with patient.  
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
## 2021-05-13 NOTE — Progress Notes (Signed)
NUTRITION ASSESSMENT  Pt identified as at risk on the Malnutrition Screen Tool  INTERVENTION: 1. Supplements: Ensure Plus High Protein po BID, each supplement provides 350 kcal and 20 grams of protein.   NUTRITION DIAGNOSIS: Unintentional weight loss related to sub-optimal intake as evidenced by pt report.   Goal: Pt to meet >/= 90% of their estimated nutrition needs.  Monitor:  PO intake  Assessment:  Pt admitted with history of schizophrenia. Pt was at [facility] ED x 1 week for withdrawal symptoms. H/o substance abuse. UDS+ for amphetamines, cocaine, THC. Pt reports poor appetite. Will order Ensure supplement given increased needs d/t substance abuse.  Height: Ht Readings from Last 1 Encounters:  05/11/21 6' (1.829 m)    Weight: Wt Readings from Last 1 Encounters:  05/11/21 76.2 kg    Weight Hx: Wt Readings from Last 10 Encounters:  05/11/21 76.2 kg  04/21/16 83.9 kg  06/02/15 81.6 kg (82 %, Z= 0.90)*   * Growth percentiles are based on CDC (Boys, 2-20 Years) data.    BMI:  Body mass index is 22.78 kg/m. Pt meets criteria for normal based on current BMI.  Estimated Nutritional Needs: Kcal: 25-30 kcal/kg Protein: > 1 gram protein/kg Fluid: 1 ml/kcal  Diet Order:  Diet Order             Diet regular Room service appropriate? No; Fluid consistency: Thin  Diet effective now                  Pt is also offered choice of unit snacks mid-morning and mid-afternoon.  Pt is eating as desired.   Lab results and medications reviewed.   Clayton Bibles, MS, RD, LDN Inpatient Clinical Dietitian Contact information available via Amion

## 2021-05-13 NOTE — Progress Notes (Signed)
Pt seen at nurse's station. Says he cannot sleep and is having pains. Provider notified. Given order for Trazodone 50 mg and Tylenol 650 mg.
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
## 2021-05-14 DIAGNOSIS — F1990 Other psychoactive substance use, unspecified, uncomplicated: Secondary | ICD-10-CM

## 2021-05-14 LAB — RPR: RPR Ser Ql: NONREACTIVE

## 2021-05-14 MED ORDER — TRAZODONE HCL 50 MG PO TABS
50.0000 mg | ORAL_TABLET | Freq: Every evening | ORAL | Status: DC | PRN
  Administered 2021-05-14: 50 mg via ORAL
  Filled 2021-05-14: qty 1

## 2021-05-14 MED ORDER — ACETAMINOPHEN 325 MG PO TABS
650.0000 mg | ORAL_TABLET | Freq: Once | ORAL | Status: AC
  Administered 2021-05-14: 650 mg via ORAL
  Filled 2021-05-14 (×2): qty 2

## 2021-05-14 MED ORDER — TRAZODONE HCL 50 MG PO TABS
50.0000 mg | ORAL_TABLET | Freq: Once | ORAL | Status: AC
  Administered 2021-05-14: 50 mg via ORAL
  Filled 2021-05-14 (×2): qty 1

## 2021-05-14 NOTE — [facility] Group Notes (Signed)
Did not attend. 

## 2021-05-14 NOTE — Group Note (Signed)
Recreation Therapy Group Note   Group Topic:Stress Management  Group Date: 05/14/2021 Start Time: 0935 End Time: 0950 Facilitators: Caroll Rancher, [place] Location: [address] Dayroom   Goal Area(s) Addresses:  Patient will identify positive stress management techniques. Patient will identify benefits of using stress management post d/c.  Group Description:  Meditation.  LRT played a meditation that focused on being resilient.  Patients were to focus and be attentive to the meditation as it played.  Patients were to also focus on their breathing and allow it to relax them as much as possible.   Affect/Mood: N/A   Participation Level: Did not attend    Clinical Observations/Individualized Feedback:     Plan: Continue to engage patient in RT group sessions 2-3x/week.   Caroll Rancher, LRT,CTRS  05/14/2021 11:32 AM

## 2021-05-14 NOTE — Progress Notes (Signed)
Pt has been up c/o pain in his back and arms. "It's bad. I need something for pain." Pt previously given pain medication and muscle relaxant with evening medications. Patient given heat packs. Pt returned again and provider notified. Given one-time dose of Tylenol 650 mg as well as one-time dose of Trazodone for sleep.

## 2021-05-14 NOTE — Progress Notes (Signed)
°   05/13/21 2030  Psych Admission Type (Psych Patients Only)  Admission Status Voluntary  Psychosocial Assessment  Patient Complaints Substance abuse;Anxiety  Eye Contact Brief  Facial Expression Flat  Affect Blunted;Anxious  Speech Press photographer;Restless;Slow  Appearance/Hygiene Disheveled  Behavior Characteristics Cooperative;Anxious;Fidgety  Mood Anxious  Thought Process  Coherency WDL  Content WDL  Delusions None reported or observed  Perception Hallucinations  Hallucination Auditory;Command (sees and hears aliens)  Judgment Impaired  Confusion None  Danger to Self  Current suicidal ideation? Denies  Danger to Others  Danger to Others None reported or observed   Pt seen at nurse's station. Still with flat affect. Pt denies SI, HI.  Pt endorses AVH: seeing and hearing aliens. Pt endorses pain in his back 10/10. Pt endorses the following withdrawal symptoms: nasal congestion, diffuse body aches, muscle aches, stomach cramps, anxiety. Pt rates anxiety 5/10 d/t needing medication for detox and denies depression. Pt told he has about 30 minutes before medication can be given. "Will you wake me up?" Pt assured that he will be awaken for medications at the proper time. Pt also noticed a reddened area on his right knee. "I've got a boil on my knee. I've had them before." Right knee examined. Area is reddened but skin is unbroken. Pt told that it will continue to be monitored.

## 2021-05-14 NOTE — Progress Notes (Addendum)
[facility] MD Progress Note  05/14/2021 5:32 PM Ossama Northam  MRN: [id number] CC: active SI  Subjective: Frederic Duley is a 26 y.o. male with PPHx of schizophrenia, who presented [facility] for stimulant withdrawal symptoms x1 week then admitted Voluntary to [facility] for treatment of command AH and active SI in the setting of recent IVDU.Marland Kitchen  [place] Day 3   Yesterday's recommendation by the Psychiatry team Increase Zyprexa 5 mg to 10 mg nightly Follow-up HIV, hepatitis panel, GC urine test Continue clonidine 0.1 mg taper per [facility] with other opiate withdrawal PRNS Discontinued scheduled ativan for no indication  Interim History: Patient was evaluated today on 300 hall in patient room.  Patient exhibited linear and coherent thought process and reported VH of aliens, demons, and monsters. However reported that they were just watching him this AM and not bothering him. Patient reported that the same [facility] has been present daily since 7-82 years old when he started using substances and was diagnosed with schizophrenia. Reported that he cannot remember a day where the [facility] were not present, but noted that he does not always mind them. Reported that he believes his past AVH mirror his thoughts. He stated that the hallucinations stated, "they're telling me I'll die and get AIDs", but after letting pt know his HIV test was nonreactive, patient stated "they don't think I have AIDs anymore". Patient denied SI/HI/VH, delusions, paranoia, first rank symptoms, and contracted to safety on the unit. Patient was not grossly responding to internal/external stimuli nor made any delusional statements during encounter.   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 No pertinent surgical history. Family History:  Family History  Family history unknown: Yes   Family Psychiatric  History: See H&P Social History:  Social History   Substance and Sexual Activity  Alcohol Use No     Social History   Substance and Sexual Activity  Drug Use Yes   Types: IV, Cocaine   Comment: Last use 05/09/20    Social History   Socioeconomic History   Marital status: Single    Spouse name: Not on file   Number of children: Not on file   Years of education: Not on file   Highest education level: Not on file  Occupational History   Not on file  Tobacco Use   Smoking status: Every Day   Smokeless tobacco: Never  Vaping Use   Vaping Use: Never used  Substance and Sexual Activity   Alcohol use: No   Drug use: Yes    Types: IV, Cocaine    Comment: Last use 05/09/20   Sexual activity: Not on file    Comment: heroin, crack cocaine  Other Topics Concern   Not on file  Social History Narrative   Not on file   Social Determinants of Health   Financial Resource Strain: Not on file  Food Insecurity: Not on file  Transportation Needs: Not on file  Physical Activity: Not on file  Stress: Not on file  Social Connections: Not on file   Additional Social  History:    Pain Medications: Pt reports abusing opiates Prescriptions: Denies abuse Over the Counter: Denies abuse History of alcohol / drug use?: Yes Negative Consequences of Use: Financial, Personal relationships Withdrawal Symptoms: Cramps, Irritability, Sweats, Diarrhea Name of Substance 1: Heroin 1 - Age of First Use: 23 1 - Amount (size/oz): Approximately 1 gram 1 - Frequency: Daily 1 - Duration: 1-2 years 1 - Last Use / Amount: 05/09/2021, 1 gram 1 - Method of Aquiring: unknown 1- Route of Use: IVDU Name of Substance 2: Methamphetamines 2 - Age of First Use: 23 2 - Amount  (size/oz): varies 2 - Frequency: daily 2 - Duration: 1-2 years 2 - Last Use / Amount: 05/09/2021, "A small amount" 2 - Method of Aquiring: unknown 2 - Route of Substance Use: intravenous                Sleep: Per above  Appetite:  Per above  Current Medications: Current Facility-Administered Medications  Medication Dose Route Frequency Provider Last Rate Last Admin   [START ON 05/15/2021] cloNIDine (CATAPRES) tablet 0.1 mg  0.1 mg Oral Daily Merlyn Lot E, NP       dicyclomine (BENTYL) tablet 20 mg  20 mg Oral Q6H PRN Merlyn Lot E, NP   20 mg at 05/14/21 0908   diphenhydrAMINE (BENADRYL) capsule 50 mg  50 mg Oral Q6H PRN Merrily Brittle, DO       Or   diphenhydrAMINE (BENADRYL) injection 50 mg  50 mg Intramuscular Q6H PRN Merrily Brittle, DO       diphenhydrAMINE (BENADRYL) injection 50 mg  50 mg Intravenous Once Massengill, Ovid Curd, MD       EPINEPHrine (EPI-PEN) injection 0.3 mg  0.3 mg Intramuscular PRN Merrily Brittle, DO       feeding supplement (ENSURE ENLIVE / ENSURE PLUS) liquid 237 mL  237 mL Oral BID BM Massengill, Ovid Curd, MD   237 mL at 05/14/21 1615   hydrOXYzine (ATARAX) tablet 25 mg  25 mg Oral Q6H PRN Merlyn Lot E, NP   25 mg at 05/14/21 0908   loperamide (IMODIUM) capsule 2-4 mg  2-4 mg Oral PRN Mallie Darting, NP       OLANZapine zydis (ZYPREXA) disintegrating tablet 5 mg  5 mg Oral Q8H PRN Massengill, Ovid Curd, MD       And   LORazepam (ATIVAN) tablet 1 mg  1 mg Oral PRN Massengill, Ovid Curd, MD       And   ziprasidone (GEODON) injection 20 mg  20 mg Intramuscular PRN Massengill, Ovid Curd, MD       methocarbamol (ROBAXIN) tablet 500 mg  500 mg Oral Q8H PRN Merlyn Lot E, NP   500 mg at 05/14/21 0908   naproxen (NAPROSYN) tablet 500 mg  500 mg Oral BID PRN Merlyn Lot E, NP   500 mg at 05/14/21 1617   nicotine (NICODERM CQ - dosed in mg/24 hours) patch 21 mg  21 mg Transdermal Daily Merlyn Lot E, NP   21 mg at 05/14/21 1614   OLANZapine (ZYPREXA) tablet 10 mg   10 mg Oral QHS Merrily Brittle, DO   10 mg at 05/13/21 2102   ondansetron (ZOFRAN-ODT) disintegrating tablet 4 mg  4 mg Oral Q6H PRN Merlyn Lot E, NP   4 mg at 05/14/21 0909   sodium chloride (OCEAN) 0.65 % nasal spray 1 spray  1 spray Each Nare PRN Rozetta Nunnery, NP   1 spray at 05/14/21 [id number]    Lab Results:  Results for orders placed or performed during the hospital encounter of 05/11/21 (from the past 48 hour(s))  Hepatic function panel     Status: None   Collection Time: 05/13/21  6:50 AM  Result Value Ref Range   Total Protein 7.0 6.5 - 8.1 g/dL   Albumin 3.8 3.5 - 5.0 g/dL   AST 26 15 - 41 U/L   ALT 26 0 - 44 U/L   Alkaline Phosphatase 64 38 - 126 U/L   Total Bilirubin 0.6 0.3 - 1.2 mg/dL   Bilirubin, Direct 0.1 0.0 - 0.2 mg/dL   Indirect Bilirubin 0.5 0.3 - 0.9 mg/dL    Comment: Performed at [facility], [place] [address] [place], [place] [address]  HIV Antibody (routine testing w rflx)     Status: None   Collection Time: 05/13/21  6:43 PM  Result Value Ref Range   HIV Screen 4th Generation wRfx Non Reactive Non Reactive    Comment: Performed at [facility], [place] [address], [place], [place] [address]  Hepatitis panel, acute     Status: Abnormal   Collection Time: 05/13/21  6:43 PM  Result Value Ref Range   Hepatitis B Surface Ag NON REACTIVE NON REACTIVE   HCV Ab Reactive (A) NON REACTIVE    Comment: (NOTE) The CDC recommends that a Reactive HCV antibody result be followed up  with a HCV Nucleic Acid Amplification test.     Hep A IgM NON REACTIVE NON REACTIVE   Hep B C IgM NON REACTIVE NON REACTIVE    Comment: Performed at [facility], [place] [address], [place], [place] [address]  RPR     Status: None   Collection Time: 05/13/21  6:43 PM  Result Value Ref Range   RPR Ser Ql NON REACTIVE NON REACTIVE    Comment: Performed at [facility], [place] [address], [place], [place] [address]    Blood Alcohol level:  Lab Results  Component  Value Date   ETH <10 05/10/2021   ETH <10 [id number]    Metabolic Disorder Labs: No results found for: HGBA1C, MPG No results found for: PROLACTIN No results found for: CHOL, TRIG, HDL, CHOLHDL, VLDL, LDLCALC  Physical Findings:  Musculoskeletal: Strength & Muscle Tone: within normal limits Gait & Station: normal Patient leans: N/A  Psychiatric Specialty Exam: Physical Exam Vitals and nursing note reviewed.  Constitutional:      General: He is not in acute distress.    Appearance: He is not ill-appearing or diaphoretic.  HENT:     Head: Normocephalic.  Pulmonary:     Effort: Pulmonary effort is normal. No respiratory distress.  Neurological:     General: No focal deficit present.     Mental Status: He is alert and oriented to person, place, and time.  Psychiatric:        Behavior: Behavior is cooperative.    Review of Systems  HENT:  Negative for rhinorrhea and trouble swallowing.   Eyes:  Negative for pain.  Respiratory:  Negative for cough, shortness of breath and wheezing.   Cardiovascular:  Negative for chest pain.  Gastrointestinal:  Positive for diarrhea.  Genitourinary:  Negative for dysuria.  Neurological:  Negative for dizziness, tremors and seizures.   Body mass index is 22.78 kg/m. Temp:  [97.7 F (36.5 C)-97.8 F (36.6 C)] 97.8 F (36.6 C) (02/01 1609) Pulse Rate:  [119-139] 132 (02/01 1609) Resp:  [18] 18 (02/01 1609) BP: (117-134)/(75-99) 134/86 (02/01  1609) SpO2:  [98 %-100 %] 100 % (02/01 1609)  General Appearance:  Disheveled; Bizarre   Eye Contact:   Fair    Speech:   Clear and Coherent; Normal Rate    Volume:   Decreased    Mood:   Depressed    Affect:   Congruent; Blunt; Depressed    Thought Process:   Coherent; Goal Directed; Linear   Descriptions of Associations: Intact  Duration of Psychotic Symptoms:  Greater than six months  Past Diagnosis of Schizophrenia or Psychoactive disorder:  Yes   Orientation:   Full  (Time, Place and Person)    Thought Content:   Logical; Perseveration; Rumination (Patient denied SI/HI, delusions, paranoia, first rank sxs. Reported AVH that he described as echoing his thoughts, "they're telling me I'll die and get AIDs", but after letting pt know his HIV test was NR, pt stated "they don't say I have AIDs anymore")  Hallucinations: Visual Description of Auditory Hallucinations: Patient denied SI/HI/AH, delusions, paranoia, first rank sxs Description of Visual Hallucinations: Demons, aliens, monsters  Ideas of Reference: None    Suicidal Thoughts:   No    Homicidal Thoughts:   No    Memory:   Immediate Fair; Recent Fair; Remote Poor    Judgement:   Fair    Insight:   Fair    Psychomotor Activity:   Normal Other (comment) (None) No    Concentration:   Good    Attention Span:   Good   Recall:   [employer] of Knowledge:   Fair    Language:   Good    Akathisia:  No  Handed:   Right    Assets:   Desire for Improvement; Leisure Time    ADL's:  Intact  Cognition:  WNL  Sleep:   Sleep: Good  Number of Hours: 4  AIMS:  , ,  ,  ,     CIWA:    COWS:  COWS Total Score: 7       Treatment Plan & Assessment Summary: Principal Problem:   Schizophrenia ([place]) Active Problems:   Stimulant abuse ([place])   Cannabis abuse   Opioid use disorder   PTSD (post-traumatic stress disorder)   IVDU (intravenous drug user)  Gannen Sikes is a 26 y.o. male with PPHx of schizophrenia & polysubstance use, who presented [facility] for stimulant withdrawal symptoms x1 week then admitted Voluntary to [facility] for treatment of command AH and active SI in the setting of recent IVDU and medication non-adherence x80mo. He was on Haldol 10 mg BID prior but was started on 5 mg qHS during admission. This resulted in swollen tongue and dysarthria that was concerning for medication hypersensitivity, which was resolved with IM Benadryl. Haldol was D/C'd and  started him on Zyprexa. [place] Day 3   PLAN: Schizophrenia Patient reported benign VH of the aliens and demons. Thought process was clear and coherent. Continue Zyprexa 10 mg qHS   Polysubstance use (stimulant, cannabis, tobacco), IVDU HIV, HBV, RPR are NR Follow-up GC urine test Continue clonidine 0.1 mg taper per [facility] with other opiate withdrawal PRNS  Hep C Ab (+) Follow-up HCV RNA, NAA Recommend PCP f/u  Dispo: Pcp, No  [place], Hawkins Duyck (mom) [phone] for confirmation of dispo plan  Safety, monitoring and disposition planning: The patient was seen and evaluated on the unit.  The patient's chart was reviewed and nursing notes were reviewed.  The patient's case was discussed  in multidisciplinary team meeting.  Plan and drug side effects were discussed with patient who was amendable. Social work and case management to assist with discharge planning and identification of hospital follow-up needs prior to discharge. Discharge Concerns: Need to establish a safety plan; Medication compliance and effectiveness Discharge Goals: Return home with outpatient referrals for mental health follow-up including medication management/psychotherapy Safety and Monitoring: Voluntary status at inpatient psychiatric unit for safety, stabilization and treatment Daily contact with patient to assess and evaluate symptoms and progress in treatment and medical management Patient's case to be discussed in multi-disciplinary team meeting Observation Level: Per above Vital signs:  q12 hours Precautions: suicide   Signed: Merrily Brittle, DO Psychiatry Resident, PGY-1 [facility] 05/14/2021, 5:32 PM

## 2021-05-14 NOTE — Group Note (Signed)
[facility] LCSW Group Therapy Note ° ° °Group Date: 05/14/2021 °Start Time: 1300 °End Time: 1400 ° ° °Type of Therapy/Topic:  Group Therapy:  Emotion Regulation ° °Participation Level:  Did Not Attend  ° °Mood: ° °Description of Group:   ° The purpose of this group is to assist patients in learning to regulate negative emotions and experience positive emotions. Patients will be guided to discuss ways in which they have been vulnerable to their negative emotions. These vulnerabilities will be juxtaposed with experiences of positive emotions or situations, and patients challenged to use positive emotions to combat negative ones. Special emphasis will be placed on coping with negative emotions in conflict situations, and patients will process healthy conflict resolution skills. ° °Therapeutic Goals: °Patient will identify two positive emotions or experiences to reflect on in order to balance out negative emotions:  °Patient will label two or more emotions that they find the most difficult to experience:  °Patient will be able to demonstrate positive conflict resolution skills through discussion or role plays:  ° °Summary of Patient Progress: ° ° °Did Not Attend ° ° ° °Therapeutic Modalities:   °Cognitive Behavioral Therapy °Feelings Identification °Dialectical Behavioral Therapy ° ° °Oreatha Fabry M Rusell Meneely, LCSWA °

## 2021-05-14 NOTE — Progress Notes (Signed)
D:  Patient's self inventory sheet, patient has poor sleep, sleep medication not helpful.  Fair appetite, low energy level, poor concentration.  Rated depression 10, hopeless and anxiety 5.  Withdrawals, runny nose, nausea, cramping.  Denied SI.  Denied physical problems, then wrote that his back, legs, arms, pain #10, pain medication given.  Goal is concentration.  Plans to "mindset".  Does have discharge plans. A:  Medications administered per MD orders.  Emotional support and encouragement given patient. R:  Denied SI and HI, contracts for safety.  Denied A/V hallucinations.  Safety maintained with 15 minute checks.

## 2021-05-14 NOTE — Progress Notes (Signed)
Pt coming to nursing station appearing very med seeking, pt given PRN Bentyl per MAR. Pt came back 5 minutes later stating he was having chest pains, NP Cody informed and pt was assessed, EKG requested , per [place] pt to go to [facility] to get assessed

## 2021-05-14 NOTE — Progress Notes (Signed)
The patient attended the first few minutes of group and then walked out.

## 2021-05-14 NOTE — Plan of Care (Signed)
Nurse discussed coping skills with patient.  

## 2021-05-14 NOTE — Progress Notes (Addendum)
Patient stated he has had staph infection in the past.  R knee is red.

## 2021-05-15 ENCOUNTER — Inpatient Hospital Stay (HOSPITAL_COMMUNITY): Payer: Federal, State, Local not specified - Other

## 2021-05-15 ENCOUNTER — Inpatient Hospital Stay (HOSPITAL_COMMUNITY)
Admission: AD | Admit: 2021-05-15 | Payer: Federal, State, Local not specified - Other | Source: Intra-hospital | Admitting: Psychiatry

## 2021-05-15 ENCOUNTER — Inpatient Hospital Stay
Admission: AD | Admit: 2021-05-15 | Payer: Federal, State, Local not specified - Other | Source: Intra-hospital | Admitting: Psychiatry

## 2021-05-15 LAB — CBC
HCT: 42.5 % (ref 39.0–52.0)
HCT: 44.1 % (ref 39.0–52.0)
Hemoglobin: 13.8 g/dL (ref 13.0–17.0)
Hemoglobin: 14.2 g/dL (ref 13.0–17.0)
MCH: 28 pg (ref 26.0–34.0)
MCH: 29.2 pg (ref 26.0–34.0)
MCHC: 32.5 g/dL (ref 30.0–36.0)
MCHC: 32.2 g/dL (ref 30.0–36.0)
MCV: 86.4 fL (ref 80.0–100.0)
MCV: 90.6 fL (ref 80.0–100.0)
Platelets: 328 10*3/uL (ref 150–400)
Platelets: 376 10*3/uL (ref 150–400)
RBC: 4.92 MIL/uL (ref 4.22–5.81)
RBC: 4.87 MIL/uL (ref 4.22–5.81)
RDW: 13.2 % (ref 11.5–15.5)
RDW: 13.3 % (ref 11.5–15.5)
WBC: 7.1 10*3/uL (ref 4.0–10.5)
WBC: 12.8 10*3/uL — ABNORMAL HIGH (ref 4.0–10.5)
nRBC: 0 % (ref 0.0–0.2)
nRBC: 0 % (ref 0.0–0.2)

## 2021-05-15 LAB — GC/CHLAMYDIA PROBE AMP (~~LOC~~) NOT AT ARMC
Chlamydia: NEGATIVE
Comment: NEGATIVE
Comment: NORMAL
Neisseria Gonorrhea: NEGATIVE

## 2021-05-15 LAB — BASIC METABOLIC PANEL
Anion gap: 8 (ref 5–15)
Anion gap: 13 (ref 5–15)
BUN: 18 mg/dL (ref 6–20)
BUN: 23 mg/dL — ABNORMAL HIGH (ref 6–20)
CO2: 25 mmol/L (ref 22–32)
CO2: 20 mmol/L — ABNORMAL LOW (ref 22–32)
Calcium: 9.4 mg/dL (ref 8.9–10.3)
Calcium: 9.1 mg/dL (ref 8.9–10.3)
Chloride: 106 mmol/L (ref 98–111)
Chloride: 103 mmol/L (ref 98–111)
Creatinine, Ser: 0.83 mg/dL (ref 0.61–1.24)
Creatinine, Ser: 0.92 mg/dL (ref 0.61–1.24)
GFR, Estimated: >60 mL/min (ref >60)
GFR, Estimated: >60 mL/min (ref >60)
Glucose, Bld: 94 mg/dL (ref 70–99)
Glucose, Bld: 120 mg/dL — ABNORMAL HIGH (ref 70–99)
Potassium: 4.1 mmol/L (ref 3.5–5.1)
Potassium: 4.7 mmol/L (ref 3.5–5.1)
Sodium: 139 mmol/L (ref 135–145)
Sodium: 136 mmol/L (ref 135–145)

## 2021-05-15 LAB — HCV RNA DIAGNOSIS, NAA: HCV RNA, Quantitation: NOT DETECTED IU/mL

## 2021-05-15 LAB — TROPONIN I (HIGH SENSITIVITY)
Troponin I (High Sensitivity): 4 ng/L (ref <18)
Troponin I (High Sensitivity): 4 ng/L (ref <18)
Troponin I (High Sensitivity): 4 ng/L (ref <18)
Troponin I (High Sensitivity): 5 ng/L (ref <18)

## 2021-05-15 LAB — CBG MONITORING, ED: Glucose-Capillary: 86 mg/dL (ref 70–99)

## 2021-05-15 LAB — D-DIMER, QUANTITATIVE: D-Dimer, Quant: <0.27 ug/mL-FEU (ref 0.00–0.50)

## 2021-05-15 LAB — TSH: TSH: 5.604 u[IU]/mL — ABNORMAL HIGH (ref 0.350–4.500)

## 2021-05-15 MED ORDER — DOXYCYCLINE HYCLATE 100 MG PO CAPS: 100.0000 mg | ORAL_CAPSULE | Freq: Two times a day (BID) | ORAL | 0 refills | Status: DC

## 2021-05-15 MED ORDER — METOPROLOL SUCCINATE 12.5 MG HALF TABLET
12.5000 mg | ORAL_TABLET | Freq: Every day | ORAL | Status: DC
  Filled 2021-05-15: qty 1

## 2021-05-15 MED ORDER — DOXYCYCLINE HYCLATE 100 MG PO TABS
100.0000 mg | ORAL_TABLET | Freq: Once | ORAL | Status: AC
  Administered 2021-05-15: 100 mg via ORAL
  Filled 2021-05-15: qty 1

## 2021-05-15 MED ORDER — LORAZEPAM 0.5 MG PO TABS
ORAL_TABLET | ORAL | Status: AC
  Filled 2021-05-15: qty 1

## 2021-05-15 MED ORDER — LORAZEPAM 0.5 MG PO TABS
0.5000 mg | ORAL_TABLET | Freq: Once | ORAL | Status: AC
  Administered 2021-05-15: 0.5 mg via ORAL

## 2021-05-15 MED ORDER — GABAPENTIN 300 MG PO CAPS
300.0000 mg | ORAL_CAPSULE | Freq: Three times a day (TID) | ORAL | Status: DC
  Administered 2021-05-15 – 2021-05-16 (×3): 300 mg via ORAL
  Filled 2021-05-15 (×11): qty 1

## 2021-05-15 MED ORDER — PROPRANOLOL HCL 10 MG PO TABS
10.0000 mg | ORAL_TABLET | Freq: Two times a day (BID) | ORAL | Status: DC
  Administered 2021-05-15: 10 mg via ORAL
  Filled 2021-05-15 (×5): qty 1

## 2021-05-15 MED ORDER — SODIUM CHLORIDE 0.9 % IV BOLUS
1000.0000 mL | Freq: Once | INTRAVENOUS | Status: AC
  Administered 2021-05-15: 1000 mL via INTRAVENOUS

## 2021-05-15 MED ORDER — METOPROLOL TARTRATE 25 MG PO TABS
25.0000 mg | ORAL_TABLET | Freq: Once | ORAL | Status: AC
Start: 2021-05-15 — End: 2021-05-15
  Administered 2021-05-15: 25 mg via ORAL
  Filled 2021-05-15: qty 1

## 2021-05-15 MED ORDER — ACETAMINOPHEN 325 MG PO TABS
650.0000 mg | ORAL_TABLET | Freq: Once | ORAL | Status: AC
  Administered 2021-05-15: 650 mg via ORAL
  Filled 2021-05-15: qty 2

## 2021-05-15 MED ORDER — METOPROLOL TARTRATE 25 MG PO TABS: 25.0000 mg | ORAL_TABLET | Freq: Two times a day (BID) | ORAL | 0 refills | Status: DC

## 2021-05-15 MED ORDER — LORAZEPAM 2 MG/ML IJ SOLN
1.0000 mg | Freq: Once | INTRAMUSCULAR | Status: AC
  Administered 2021-05-15: 1 mg via INTRAVENOUS
  Filled 2021-05-15: qty 1

## 2021-05-15 NOTE — ED Notes (Signed)
Graham crackers provided to pt. Pt calm and cooperative at this time.

## 2021-05-15 NOTE — ED Notes (Signed)
Safe transport called for pt's ride back to [facility]

## 2021-05-15 NOTE — Progress Notes (Signed)
Pt later stated he was ok and did not want to go to the ED, pt was informed that he needed to get checked out to make sure nothing serious was going on with him

## 2021-05-15 NOTE — Progress Notes (Signed)
Pt is at the nurses station asking not to be sent to the ED. Pt stated that his  chest pain has resold. Pt stated he thought he was just going to be given medication and go back to sleep. Pt explained that he needed to be checked to rule out anything that could be serious. Pt is lying on the floor in front of the nurses station awaiting EMS. Will continue to monitor.

## 2021-05-15 NOTE — ED Provider Notes (Signed)
[facility] Provider Note   CSN: [id number] Arrival date & time: 05/15/21  0117     History  Chief: Tachycardia  Cody Poole is a 26 y.o. male.   Patient was initially evaluated in the emergency room of tachycardia.  [facility].  He was sent from there early this morning for evaluation of chest pain.  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  Patient was having trouble with intermittent sharp chest pain.  He had an extensive evaluation in the emergency room.  That evaluation was reviewed.  Patient had laboratory test including cardiac enzymes as well as a D-dimer.  They were all normal.  Patient was sent back to the emergency room today because he is having tachycardia.  Patient denies any symptoms.  He is not having any chest pain or shortness of breath.  He denies any fevers or chills.  He states he feels fine and wants to try to go home tomorrow  Home Medications Prior to Admission medications   Medication Sig Start Date End Date Taking? Authorizing Provider  doxycycline (VIBRAMYCIN) 100 MG capsule Take 1 capsule (100 mg total) by mouth 2 (two) times daily. One po bid x 7 days 05/15/21  Yes Harris, Abigail, PA-C  metoprolol tartrate (LOPRESSOR) 25 MG tablet Take 1 tablet (25 mg total) by mouth 2 (two) times daily. 05/15/21 06/14/21 Yes Linwood DibblesKnapp, Ailyn Gladd, MD  haloperidol (HALDOL) 10 MG tablet Take 1 tablet (10 mg total) by mouth 2 (two) times daily. 05/10/21   Terrilee FilesButler, Michael C, MD  methocarbamol (ROBAXIN) 500 MG tablet Take 1 tablet (500 mg total) by mouth 2 (two) times daily. Patient not taking: Reported on 05/11/2021 10/13/20   Gailen ShelterFondaw, Wylder S, PA      Allergies    Haldol [haloperidol]    Review of Systems   Review of Systems  Constitutional:  Negative for fever.  Cardiovascular:  Positive for chest pain.   Physical Exam Updated Vital Signs BP 113/72 (BP Location: Left Arm)    Pulse 84    Temp 98.7  F (37.1 C) (Oral)    Resp 16    Ht 1.829 m (6')    Wt 76.2 kg    SpO2 100%    BMI 22.78 kg/m  Physical Exam Vitals and nursing note reviewed.  Constitutional:      General: He is not in acute distress.    Appearance: He is well-developed.  HENT:     Head: Normocephalic and atraumatic.     Right Ear: External ear normal.     Left Ear: External ear normal.  Eyes:     General: No scleral icterus.       Right eye: No discharge.        Left eye: No discharge.     Conjunctiva/sclera: Conjunctivae normal.  Neck:     Trachea: No tracheal deviation.  Cardiovascular:     Rate and Rhythm: Regular rhythm. Tachycardia present.     Heart sounds: Normal heart sounds.  Pulmonary:     Effort: Pulmonary effort is normal. No respiratory distress.     Breath sounds: Normal breath sounds. No stridor. No wheezing or rales.  Abdominal:     General: Bowel sounds are normal. There is no distension.     Palpations: Abdomen is soft.     Tenderness: There is no abdominal tenderness. There is no guarding or rebound.  Musculoskeletal:  General: No tenderness or deformity.     Cervical back: Neck supple.  Skin:    General: Skin is warm and dry.     Findings: No rash.  Neurological:     General: No focal deficit present.     Mental Status: He is alert.     Cranial Nerves: No cranial nerve deficit (no facial droop, extraocular movements intact, no slurred speech).     Sensory: No sensory deficit.     Motor: No abnormal muscle tone or seizure activity.     Coordination: Coordination normal.  Psychiatric:        Mood and Affect: Mood normal.    ED Results / Procedures / Treatments   Labs (all labs ordered are listed, but only abnormal results are displayed) Labs Reviewed  HEPATITIS PANEL, ACUTE - Abnormal; Notable for the following components:      Result Value   HCV Ab Reactive (*)    All other components within normal limits  BASIC METABOLIC PANEL - Abnormal; Notable for the following  components:   CO2 20 (*)    Glucose, Bld 120 (*)    BUN 23 (*)    All other components within normal limits  CBC - Abnormal; Notable for the following components:   WBC 12.8 (*)    All other components within normal limits  TSH - Abnormal; Notable for the following components:   TSH 5.604 (*)    All other components within normal limits  HEPATIC FUNCTION PANEL  HIV ANTIBODY (ROUTINE TESTING W REFLEX)  RPR  HCV RNA DIAGNOSIS, NAA  BASIC METABOLIC PANEL  CBC  D-DIMER, QUANTITATIVE  CBG MONITORING, ED  GC/CHLAMYDIA PROBE AMP ([place]) NOT AT [facility]  TROPONIN I (HIGH SENSITIVITY)  TROPONIN I (HIGH SENSITIVITY)  TROPONIN I (HIGH SENSITIVITY)  TROPONIN I (HIGH SENSITIVITY)    EKG EKG Interpretation  Date/Time:  Thursday May 15 2021 17:44:53 EST Ventricular Rate:  131 PR Interval:  168 QRS Duration: 86 QT Interval:  266 QTC Calculation: 392 R Axis:   65 Text Interpretation: Sinus tachycardia Right atrial enlargement Nonspecific T wave abnormality Abnormal ECG When compared with ECG of 15-May-2021 02:38, Rate faster Confirmed by Linwood Dibbles [phone]) on 05/15/2021 5:49:45 PM  Radiology DG Chest 2 View  Result Date: 05/15/2021 CLINICAL DATA:  Palpitations EXAM: CHEST - 2 VIEW COMPARISON:  None. FINDINGS: The heart size and mediastinal contours are within normal limits. Both lungs are clear. The visualized skeletal structures are unremarkable. IMPRESSION: No active cardiopulmonary disease. Electronically Signed   By: Helyn Numbers M.D.   On: 05/15/2021 18:49   DG Chest Portable 1 View  Result Date: 05/15/2021 CLINICAL DATA:  Chest pain EXAM: PORTABLE CHEST 1 VIEW COMPARISON:  05/10/2020 FINDINGS: The heart size and mediastinal contours are within normal limits. Both lungs are clear. The visualized skeletal structures are unremarkable. IMPRESSION: No active disease. Electronically Signed   By: Alcide Clever M.D.   On: 05/15/2021 02:54    Procedures Procedures    Medications  Ordered in ED Medications  cloNIDine (CATAPRES) tablet 0.1 mg (0.1 mg Oral Given 05/12/21 1656)    Followed by  cloNIDine (CATAPRES) tablet 0.1 mg (0.1 mg Oral Given 05/14/21 1613)    Followed by  cloNIDine (CATAPRES) tablet 0.1 mg (0.1 mg Oral Given 05/15/21 1139)  dicyclomine (BENTYL) tablet 20 mg (20 mg Oral Given 05/14/21 2321)  hydrOXYzine (ATARAX) tablet 25 mg (25 mg Oral Given 05/14/21 0908)  loperamide (IMODIUM) capsule 2-4 mg (has no  administration in time range)  methocarbamol (ROBAXIN) tablet 500 mg (500 mg Oral Given 05/14/21 2012)  naproxen (NAPROSYN) tablet 500 mg (500 mg Oral Given 05/14/21 1617)  nicotine (NICODERM CQ - dosed in mg/24 hours) patch 21 mg (21 mg Transdermal Patch Applied 05/15/21 1139)  ondansetron (ZOFRAN-ODT) disintegrating tablet 4 mg (4 mg Oral Given 05/14/21 0909)  diphenhydrAMINE (BENADRYL) injection 50 mg (50 mg Intravenous Not Given 05/12/21 1418)  diphenhydrAMINE (BENADRYL) capsule 50 mg (has no administration in time range)    Or  diphenhydrAMINE (BENADRYL) injection 50 mg (has no administration in time range)  EPINEPHrine (EPI-PEN) injection 0.3 mg (has no administration in time range)  OLANZapine (ZYPREXA) tablet 10 mg (10 mg Oral Given 05/14/21 2126)  feeding supplement (ENSURE ENLIVE / ENSURE PLUS) liquid 237 mL (237 mLs Oral Given 05/15/21 1337)  OLANZapine zydis (ZYPREXA) disintegrating tablet 5 mg (has no administration in time range)    And  LORazepam (ATIVAN) tablet 1 mg (has no administration in time range)    And  ziprasidone (GEODON) injection 20 mg (has no administration in time range)  sodium chloride (OCEAN) 0.65 % nasal spray 1 spray (1 spray Each Nare Given 05/14/21 0905)  traZODone (DESYREL) tablet 50 mg (50 mg Oral Given 05/14/21 2126)  gabapentin (NEURONTIN) capsule 300 mg (300 mg Oral Given 05/15/21 1705)  metoprolol succinate (TOPROL-XL) 24 hr tablet 12.5 mg (12.5 mg Oral Not Given 05/15/21 1833)  white petrolatum (VASELINE) gel (  Given 05/11/21 1730)   influenza vac split quadrivalent PF (FLUARIX) injection 0.5 mL (0.5 mLs Intramuscular Given 05/12/21 1518)  diphenhydrAMINE (BENADRYL) injection 50 mg (50 mg Intramuscular Given 05/12/21 1417)  traZODone (DESYREL) tablet 50 mg (50 mg Oral Given 05/12/21 2324)  acetaminophen (TYLENOL) tablet 650 mg (650 mg Oral Given 05/12/21 2324)  LORazepam (ATIVAN) tablet 1 mg (1 mg Oral Given 05/12/21 2359)  traZODone (DESYREL) tablet 50 mg (50 mg Oral Given 05/14/21 0158)  acetaminophen (TYLENOL) tablet 650 mg (650 mg Oral Given 05/14/21 0158)  sodium chloride 0.9 % bolus 1,000 mL (0 mLs Intravenous Stopped 05/15/21 0628)  doxycycline (VIBRA-TABS) tablet 100 mg (100 mg Oral Given 05/15/21 0245)  LORazepam (ATIVAN) injection 1 mg (1 mg Intravenous Given 05/15/21 0703)  LORazepam (ATIVAN) tablet 0.5 mg (0.5 mg Oral Given 05/15/21 1337)  LORazepam (ATIVAN) 0.5 MG tablet (  Duplicate 05/15/21 1503)  metoprolol tartrate (LOPRESSOR) tablet 25 mg (25 mg Oral Given 05/15/21 1833)  acetaminophen (TYLENOL) tablet 650 mg (650 mg Oral Given 05/15/21 2047)    ED Course/ Medical Decision Making/ A&P Clinical Course as of 05/15/21 2134  Thu May 15, 2021  2041 Patient's TSH is slightly elevated [JK]  2041 Troponin this afternoon normal at 4 [JK]  2131 Second troponin is normal [JK]  2131 Repeat heart rate is 84 [JK]    Clinical Course User Index [JK] Linwood Dibbles, MD                           Medical Decision Making Amount and/or Complexity of Data Reviewed Labs: ordered. Radiology: ordered.  Risk OTC drugs. Prescription drug management.   Records reviewed from the patient's previous ED visits.  ED work-up is reassuring.  Patient was sent back to the ED for evaluation of tachycardia.  Patient is not having any signs of infection.  He is not having any chest pain or shortness of breath.  Patient had a negative D-dimer arguing against pulmonary embolism.  Patient does  have slight increase in his thyroid level.  Its possible this  could be contributing to his tachycardia.  Not showing signs of thyroid storm so I do feel he safe to follow-up as an outpatient.  We will have him start a low-dose beta-blocker.  Stable for discharge back to the psychiatric facility.       Final Clinical Impression(s) / ED Diagnoses Final diagnoses:  Tachycardia  Elevated TSH    Rx / DC Orders ED Discharge Orders          Ordered    doxycycline (VIBRAMYCIN) 100 MG capsule  2 times daily        05/15/21 0642    metoprolol tartrate (LOPRESSOR) 25 MG tablet  2 times daily        05/15/21 2132    nicotine (NICODERM CQ - DOSED IN MG/24 HOURS) 21 mg/24hr patch  Daily        Pending              Linwood DibblesKnapp, Airabella Barley, MD 05/15/21 2134

## 2021-05-15 NOTE — Progress Notes (Addendum)
Notified by nursing staff that patient was complaining of chest pain.  Patient seen and examined face-to-face by this provider with RN present.  Patient endorses radiating left-sided chest pain that is radiating down patient's bilateral arms.  He reports that his chest pain began 2 hours ago and he reports that he is still experiencing this chest pain at this time.  He describes his chest pain as sharp/stabbing in nature and rates his pain as a 10 out of 10 on the pain scale (10 being the highest pain score).  Patient denies history of any previous MI, PE, or cardiac conditions.  Patient denies any headache, lightheadedness, dizziness, vision changes, shortness of breath, nausea, vomiting, diaphoresis, back pain, abdominal pain.  He does endorse generalized bilateral leg pain that he describes as sharp/stabbing in nature as well.  Patient denies any swelling.  Patient reports that he uses meth and heroin via IV route.  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  Patient not on any anticoagulants.  Patient also endorses redness of his right knee cap and states that he thinks that his right kneecap may be infected.  He denies any fever, chills, or any additional physical symptoms.  Vital signs show blood pressure 139/98, pulse elevated at 145, SPO2 100% on room air.  On exam, patient is lying down, in no acute distress.  Patient alert and oriented x4.  No diaphoresis noted.  Heart regular rhythm with tachycardia noted upon auscultation.  No murmurs, rubs, or gallops noted.  Lungs are clear to auscultation in bilateral anterior and posterior lung fields.  No wheezes, rhonchi, or rales noted.  Respiratory effort within normal limits.  Respirations even and unlabored.  Patient endorses worsening sharp/stabbing left-sided chest pain upon inspiration.  Carotid pulses 3+ bilaterally.  Radial pulses 3+ bilaterally.  Dorsalis pedis pulses 2+ bilaterally.   Slight swelling noted of patient's right patella. Medium sized area of erythema with central poor/opening noted of patient's right patellar area that is mildly warm to palpation.  No swelling noted of remainder of patient's bilateral lower extremities.  Patient endorses pain upon palpation of his bilateral lower extremities.  EOMs intact bilaterally.  EKG performed, which shows sinus rhythm with marked sinus arrhythmia and signs of right atrial enlargement, ventricular rate 90 bpm, PR interval 04 ms, QRS 94 ms, and QT/QTc 358/437 ms.  Will transfer the patient to [facility] emergency department for medical clearance/evaluation/work-up/treatment of patient's chest pain and for investigation of potential infection of patient's right patellar area.  Provider report given to Dr. Pilar Plate [facility] ED) via phone by this provider, who has agreed to accept the patient.  Provider report also given to [facility] emergency department charge nurse via phone by this provider.  EMTALA form completed.  Patient to be transported to [facility] emergency department via EMS. Patient may return to [facility] to resume inpatient psychiatric treatment when medically cleared.

## 2021-05-15 NOTE — Discharge Instructions (Addendum)
Dear Cody Poole, I am so glad you are feeling better and can be discharged today!  Please see below for doctor or provider within 1-2 WEEKS of leaving for a HOSPITAL FOLLOW-UP APPOINTMENT and see below for any other follow-up appointments.  Additional Instructions: Diagnosis:  Principal Problem:   Schizophrenia (HCC) Active Problems:   Stimulant abuse (HCC)   Cannabis abuse   Opioid use disorder   PTSD (post-traumatic stress disorder)   IVDU (intravenous drug user)    If you are experiencing side effects, please DO NOT STOP your meds, rather see your psychiatrist first to change it.  Concerns & meds: Take the metoprolol to help with the fast heart rate.   Your thyroid tests were slightly elevated, please see your primary care provider for repeat labs and possibly starting you on a medicine that will help with your thyroid (controls your metabolism), fast heart rate, and anxiety.   Sharing needles puts you at risk for HIV, hepatitis (liver infection), and other diseases. In the hospital you were negative for HIV. Your hepatitis blood work showed that you most likely had HCV (liver virus infection) in the past. However we need to make sure the test was not done too early, which can look like you do not have an infection. This means you will need to see your PCP to repeat the liver lab work again to insure you do not have a virus liver infection.  Therapy:  Please CALL the resources provided for therapy to make an appointment Recommend that you continue therapy for about 5 years Examples of therapy to consider: Cognitive behavioral therapy (CBT) Dialectical behavior therapy (DBT) Trauma therapy Motivational enhancement therapy Solution-focused brief therapy Psychodynamic therapy Interpersonal therapy Group therapy AA EMERGENCY: National Suicide Hotline: Call [address] and Text for Suicide: Administrator, arts and Text 911 for ED [facility] Address: [address] [place], [place] [address] Please see your discharge packet for other resources  It was a pleasure meeting you, Cody Poole. I wish you the best, and hope you stay happy and healthy!  Princess Bruins, DO 05/16/2021

## 2021-05-15 NOTE — Progress Notes (Signed)
When pt was on the table and EKG was being performed pt began jerking randomly appeared to be voluntary. Pt was observed for the next 20 minutes and no jerking motions were observed.

## 2021-05-15 NOTE — Progress Notes (Signed)
[facility] MD Progress Note  05/15/2021 5:55 PM Cody AliasBlake Stenglein  MRN: [id number] CC: active SI  Subjective: Cody AliasBlake Foley is a 26 y.o. male with PPHx of schizophrenia, who [facility] symptoms x1 week then admitted Voluntary to [facility] for treatment of command AH and active SI in the setting of recent IVDU.Marland Kitchen.  [facility] Day 4   Yesterday's recommendation by the Psychiatry team Follow-up HCV RNA, NAA Follow-up GC urine test Continue clonidine 0.1 [facility] with other opiate withdrawal PRNS Continue Zyprexa 10 mg qHS  Interim History: Patient was evaluated today on 300 hall in patient room.  Patient exhibited has ongoing VH of aliens, demons, and monsters however they are no longer command in nature and described them as supportive today. Reported that he was seeing them during the evaluation, but denied AH today. He described the VH as an alien on the wall with a big nose, that is nice to him. He perseverated on wanting to go home, social workers, needing to get clean, and his hepatitis labs.  Stated that he is anxious about going home and staying clean.  He then became teary when talking about his mom and his going home.  He reported stable appetite.  He reported poor sleep, given his trip to the ED in the middle of the night. Patient denied CP, SOB, dizziness. Patient denied SI/HI/VH, delusions, paranoia, first rank symptoms, and contracted to safety on the unit. Patient was not grossly responding to internal/external stimuli nor made any delusional statements during encounter.   Collateral: Almyra FreeBETSY Lizotte (mom) [phone] Mom stated she is supportive and ready for him to go home when he is stable psychiatrically and medically stable. Mom reported that the patient's father had schizophrenia and died when the patient was 4-6yo. Stated that the patient was diagnosed with schizophrenia at 89years old. Mom stated that the patient left the home to do meth 2 months ago,  and is elated that he was able to find help, get started on meds, and will be returning back to her.   Diagnosis:  Principal Problem:   Schizophrenia (HCC) Active Problems:   Stimulant abuse (HCC)   Cannabis abuse   Opioid use disorder   PTSD (post-traumatic stress disorder)   IVDU (intravenous drug user)  Past Psychiatric History: See H&P  Past Medical History:  Past Medical History:  Diagnosis Date   Anxiety    Back pain    Depression    Hypertension    Paranoid schizophrenia (HCC)    History reviewed. No pertinent surgical history. Family History:  Family History  Family history unknown: Yes   Family Psychiatric  History: See H&P Social History:  Social History   Substance and Sexual Activity  Alcohol Use No     Social History   Substance and Sexual Activity  Drug Use Yes   Types: IV, Cocaine   Comment: Last use 05/09/20    Social History   Socioeconomic History   Marital status: Single    Spouse name: Not on file   Number of children: Not on file   Years of education: Not on file   Highest education level: Not on file  Occupational History   Not on file  Tobacco Use   Smoking status: Every Day   Smokeless tobacco: Never  Vaping Use   Vaping Use: Never used  Substance and Sexual Activity   Alcohol use: No   Drug use: Yes    Types:  IV, Cocaine    Comment: Last use 05/09/20   Sexual activity: Not on file    Comment: heroin, crack cocaine  Other Topics Concern   Not on file  Social History Narrative   Not on file   Social Determinants of Health   Financial Resource Strain: Not on file  Food Insecurity: Not on file  Transportation Needs: Not on file  Physical Activity: Not on file  Stress: Not on file  Social Connections: Not on file   Additional Social History:    Pain Medications: Pt reports abusing opiates Prescriptions: Denies abuse Over the Counter: Denies abuse History of alcohol / drug use?: Yes Negative Consequences of Use:  Financial, Personal relationships Withdrawal Symptoms: Cramps, Irritability, Sweats, Diarrhea Name of Substance 1: Heroin 1 - Age of First Use: 23 1 - Amount (size/oz): Approximately 1 gram 1 - Frequency: Daily 1 - Duration: 1-2 years 1 - Last Use / Amount: 05/09/2021, 1 gram 1 - Method of Aquiring: unknown 1- Route of Use: IVDU Name of Substance 2: Methamphetamines 2 - Age of First Use: 23 2 - Amount (size/oz): varies 2 - Frequency: daily 2 - Duration: 1-2 years 2 - Last Use / Amount: 05/09/2021, "A small amount" 2 - Method of Aquiring: unknown 2 - Route of Substance Use: intravenous                Sleep: Per above  Appetite:  Per above  Current Medications: Current Facility-Administered Medications  Medication Dose Route Frequency Provider Last Rate Last Admin   cloNIDine (CATAPRES) tablet 0.1 mg  0.1 mg Oral Daily Ophelia Shoulder E, NP   0.1 mg at 05/15/21 1139   dicyclomine (BENTYL) tablet 20 mg  20 mg Oral Q6H PRN Ophelia Shoulder E, NP   20 mg at 05/14/21 2321   diphenhydrAMINE (BENADRYL) capsule 50 mg  50 mg Oral Q6H PRN Princess Bruins, DO       Or   diphenhydrAMINE (BENADRYL) injection 50 mg  50 mg Intramuscular Q6H PRN Princess Bruins, DO       diphenhydrAMINE (BENADRYL) injection 50 mg  50 mg Intravenous Once Massengill, Harrold Donath, MD       EPINEPHrine (EPI-PEN) injection 0.3 mg  0.3 mg Intramuscular PRN Princess Bruins, DO       feeding supplement (ENSURE ENLIVE / ENSURE PLUS) liquid 237 mL  237 mL Oral BID BM Massengill, Harrold Donath, MD   237 mL at 05/15/21 1337   gabapentin (NEURONTIN) capsule 300 mg  300 mg Oral TID Princess Bruins, DO   300 mg at 05/15/21 1705   hydrOXYzine (ATARAX) tablet 25 mg  25 mg Oral Q6H PRN Chales Abrahams, NP   25 mg at 05/14/21 0908   loperamide (IMODIUM) capsule 2-4 mg  2-4 mg Oral PRN Chales Abrahams, NP       OLANZapine zydis (ZYPREXA) disintegrating tablet 5 mg  5 mg Oral Q8H PRN Massengill, Harrold Donath, MD       And   LORazepam (ATIVAN) tablet 1 mg   1 mg Oral PRN Massengill, Harrold Donath, MD       And   ziprasidone (GEODON) injection 20 mg  20 mg Intramuscular PRN Massengill, Harrold Donath, MD       methocarbamol (ROBAXIN) tablet 500 mg  500 mg Oral Q8H PRN Ophelia Shoulder E, NP   500 mg at 05/14/21 2012   metoprolol succinate (TOPROL-XL) 24 hr tablet 12.5 mg  12.5 mg Oral Daily Princess Bruins, DO  naproxen (NAPROSYN) tablet 500 mg  500 mg Oral BID PRN Ophelia Shoulder E, NP   500 mg at 05/14/21 1617   nicotine (NICODERM CQ - dosed in mg/24 hours) patch 21 mg  21 mg Transdermal Daily Ophelia Shoulder E, NP   21 mg at 05/15/21 1139   OLANZapine (ZYPREXA) tablet 10 mg  10 mg Oral QHS Princess Bruins, DO   10 mg at 05/14/21 2126   ondansetron (ZOFRAN-ODT) disintegrating tablet 4 mg  4 mg Oral Q6H PRN Ophelia Shoulder E, NP   4 mg at 05/14/21 6389   sodium chloride (OCEAN) 0.65 % nasal spray 1 spray  1 spray Each Nare PRN Jackelyn Poling, NP   1 spray at 05/14/21 0905   traZODone (DESYREL) tablet 50 mg  50 mg Oral QHS PRN Phineas Inches, MD   50 mg at 05/14/21 2126   Current Outpatient Medications  Medication Sig Dispense Refill   doxycycline (VIBRAMYCIN) 100 MG capsule Take 1 capsule (100 mg total) by mouth 2 (two) times daily. One po bid x 7 days 14 capsule 0   haloperidol (HALDOL) 10 MG tablet Take 1 tablet (10 mg total) by mouth 2 (two) times daily. 60 tablet 0   methocarbamol (ROBAXIN) 500 MG tablet Take 1 tablet (500 mg total) by mouth 2 (two) times daily. (Patient not taking: Reported on 05/11/2021) 20 tablet 0    Lab Results:  Results for orders placed or performed during the hospital encounter of 05/11/21 (from the past 48 hour(s))  HIV Antibody (routine testing w rflx)     Status: None   Collection Time: 05/13/21  6:43 PM  Result Value Ref Range   HIV Screen 4th Generation wRfx Non Reactive Non Reactive    Comment: Performed at [facility], [address], [place], [place] [address]  Hepatitis panel, acute     Status: Abnormal   Collection  Time: 05/13/21  6:43 PM  Result Value Ref Range   Hepatitis B Surface Ag NON REACTIVE NON REACTIVE   HCV Ab Reactive (A) NON REACTIVE    Comment: (NOTE) The CDC recommends that a Reactive HCV antibody result be followed up  with a HCV Nucleic Acid Amplification test.     Hep A IgM NON REACTIVE NON REACTIVE   Hep B C IgM NON REACTIVE NON REACTIVE    Comment: Performed at [facility], [address] [place], [place] [address]  RPR     Status: None   Collection Time: 05/13/21  6:43 PM  Result Value Ref Range   RPR Ser Ql NON REACTIVE NON REACTIVE    Comment: Performed at [facility], [address] [place], [place] [address]  Basic metabolic panel     Status: None   Collection Time: 05/15/21  2:28 AM  Result Value Ref Range   Sodium 139 135 - 145 mmol/L   Potassium 4.1 3.5 - 5.1 mmol/L   Chloride 106 98 - 111 mmol/L   CO2 25 22 - 32 mmol/L   Glucose, Bld 94 70 - 99 mg/dL    Comment: Glucose reference range applies only to samples taken after fasting for at least 8 hours.   BUN 18 6 - 20 mg/dL   Creatinine, Ser 2.03 0.61 - 1.24 mg/dL   Calcium 9.4 8.9 - 55.9 mg/dL   GFR, Estimated >74 >16 mL/min    Comment: (NOTE) Calculated using the CKD-EPI Creatinine Equation (2021)    Anion gap 8 5 -  15    Comment: Performed at [facility], [address], [place], [place] [address]  Troponin I (High Sensitivity)     Status: None   Collection Time: 05/15/21  2:28 AM  Result Value Ref Range   Troponin I (High Sensitivity) 4 <18 ng/L    Comment: (NOTE) Elevated high sensitivity troponin I (hsTnI) values and significant  changes across serial measurements may suggest ACS but many other  chronic and acute conditions are known to elevate hsTnI results.  Refer to the "Links" section for chest pain algorithms and additional  guidance. Performed at [facility], [address], [place], [place] [address]   CBC     Status: None   Collection Time: 05/15/21  2:28 AM   Result Value Ref Range   WBC 7.1 4.0 - 10.5 K/uL   RBC 4.92 4.22 - 5.81 MIL/uL   Hemoglobin 13.8 13.0 - 17.0 g/dL   HCT 95.6 21.3 - 08.6 %   MCV 86.4 80.0 - 100.0 fL   MCH 28.0 26.0 - 34.0 pg   MCHC 32.5 30.0 - 36.0 g/dL   RDW 57.8 46.9 - 62.9 %   Platelets 328 150 - 400 K/uL   nRBC 0.0 0.0 - 0.2 %    Comment: Performed at [facility], [address], [place], [place] [address]  D-dimer, quantitative     Status: None   Collection Time: 05/15/21  2:28 AM  Result Value Ref Range   D-Dimer, Quant <0.27 0.00 - 0.50 ug/mL-FEU    Comment: (NOTE) At the manufacturer cut-off value of 0.5 g/mL FEU, this assay has a negative predictive value of 95-100%.This assay is intended for use in conjunction with a clinical pretest probability (PTP) assessment model to exclude pulmonary embolism (PE) and deep venous thrombosis (DVT) in outpatients suspected of PE or DVT. Results should be correlated with clinical presentation. Performed at [facility], [address], [place], [place] [address]   CBG monitoring, ED     Status: None   Collection Time: 05/15/21  2:50 AM  Result Value Ref Range   Glucose-Capillary 86 70 - 99 mg/dL    Comment: Glucose reference range applies only to samples taken after fasting for at least 8 hours.  Troponin I (High Sensitivity)     Status: None   Collection Time: 05/15/21  4:28 AM  Result Value Ref Range   Troponin I (High Sensitivity) 4 <18 ng/L    Comment: (NOTE) Elevated high sensitivity troponin I (hsTnI) values and significant  changes across serial measurements may suggest ACS but many other  chronic and acute conditions are known to elevate hsTnI results.  Refer to the "Links" section for chest pain algorithms and additional  guidance. Performed at [facility], [address], [place], [place] [address]     Blood Alcohol level:  Lab Results  Component Value Date   ETH <10 05/10/2021   ETH <10 10/13/2020    Metabolic  Disorder Labs: No results found for: HGBA1C, MPG No results found for: PROLACTIN No results found for: CHOL, TRIG, HDL, CHOLHDL, VLDL, LDLCALC  Physical Findings:  Musculoskeletal: Strength & Muscle Tone: within normal limits Gait & Station: normal Patient leans: N/A  Psychiatric Specialty Exam: Physical Exam Vitals and nursing note reviewed.  Constitutional:      General: He is not in acute distress.    Appearance: He is not ill-appearing or diaphoretic.  HENT:     Head:  Normocephalic.  Pulmonary:     Effort: Pulmonary effort is normal. No respiratory distress.  Neurological:     General: No focal deficit present.     Mental Status: He is alert and oriented to person, place, and time.  Psychiatric:        Behavior: Behavior is cooperative.    Review of Systems  HENT:  Negative for rhinorrhea and trouble swallowing.   Eyes:  Negative for pain.  Respiratory:  Negative for cough, shortness of breath and wheezing.   Cardiovascular:  Negative for chest pain.  Gastrointestinal:  Positive for diarrhea.  Genitourinary:  Negative for dysuria.  Neurological:  Negative for dizziness, tremors and seizures.   Body mass index is 22.78 kg/m. Temp:  [97.5 F (36.4 C)-98.7 F (37.1 C)] 98.7 F (37.1 C) (02/02 1746) Pulse Rate:  [83-145] 130 (02/02 1746) Resp:  [13-22] 18 (02/02 1746) BP: (113-149)/(83-108) 128/90 (02/02 1746) SpO2:  [97 %-100 %] 98 % (02/02 1746)  General Appearance:  Disheveled; Bizarre (Tired appearing)   Eye Contact:   Fair    Speech:   Clear and Coherent; Normal Rate    Volume:   Decreased    Mood:   Anxious    Affect:   Congruent; Constricted; Tearful    Thought Process:   Coherent; Goal Directed; Linear (Concrete)   Descriptions of Associations: Circumstantial  Duration of Psychotic Symptoms:  Greater than six months  Past Diagnosis of Schizophrenia or Psychoactive disorder:  Yes   Orientation:   Full (Time, Place and Person)     Thought Content:   Rumination; Perseveration (Denied ideas of reference, first rank symptoms)  Hallucinations: Visual Description of Auditory Hallucinations: Denied Description of Visual Hallucinations: Aliens, monsters, demons  Ideas of Reference: None    Suicidal Thoughts:   No    Homicidal Thoughts:   No    Memory:   Immediate Fair; Recent Fair; Remote Poor    Judgement:   Fair    Insight:   Lacking    Psychomotor Activity:   Normal -- (None) Yes    Concentration:   Fair    Attention Span:   Good   Recall:   [employer]    Fund of Knowledge:   Fair    Language:   Good    Akathisia:  No  Handed:   Right    Assets:   Desire for Improvement; Housing; Intimacy; Leisure Time    ADL's:  Intact  Cognition:  WNL  Sleep:   Sleep: Poor (Was sent to the ED in the middle of the night)  Number of Hours: 4  AIMS: Facial and Oral Movements Muscles of Facial Expression: None, normal Lips and Perioral Area: None, normal Jaw: None, normal Tongue: None, normal,Extremity Movements Upper (arms, wrists, hands, fingers): None, normal Lower (legs, knees, ankles, toes): None, normal, Trunk Movements Neck, shoulders, hips: None, normal, Overall Severity Severity of abnormal movements (highest score from questions above): None, normal Incapacitation due to abnormal movements: None, normal Patient's awareness of abnormal movements (rate only patient's report): No Awareness, Dental Status Current problems with teeth and/or dentures?: No Does patient usually wear dentures?: No   CIWA:    COWS:  COWS Total Score: 7       Treatment Plan & Assessment Summary: Principal Problem:   Schizophrenia (HCC) Active Problems:   Stimulant abuse (HCC)   Cannabis abuse   Opioid use disorder   PTSD (post-traumatic stress disorder)   IVDU (intravenous drug user)  Cody Poole is  a 26 y.o. male with PPHx of schizophrenia & polysubstance use, who presented to [facility] ED for  stimulant withdrawal symptoms x1 week then admitted Voluntary to [facility] for treatment of command AH and active SI in the setting of recent IVDU and medication non-adherence x66mo. He was on Haldol 10 mg BID prior but was started on 5 mg qHS during admission. This resulted in swollen tongue and dysarthria that was concerning for medication hypersensitivity, which was resolved with IM Benadryl. Haldol was D/C'd and started him on Zyprexa. BHH Day 4   PLAN: Schizophrenia, stable Patient reported benign VH of the aliens and demons. Thought process was clear and coherent. Continue Zyprexa 10 mg qHS   Polysubstance use (stimulant, cannabis, tobacco), IVDU HIV, HBV, RPR are NR Follow-up GC urine test Continue clonidine 0.1 mg taper per [facility] with other opiate withdrawal PRNS  Hep C Ab (+) Follow-up HCV RNA, NAA Recommend PCP f/u  GAD with panic attack Patient was taken to the ED last night for atypical CP for ACS r/o given his IVDU history. No evidence of ischemic changes, and was medically cleared by [facility] ED.  Sinus Tachycardia Evident on EKG today along with atrial enlargement is concerning for possible paroxysmal atrial fibrillation. Other considerations, history of IVDU, HTN on admission, and current withdrawal symptoms. The patient's heart rate has been elevated x2 days, today it was in 130s despite Ativan and propranolol.  First 2 manual reading initially showed HR in 70s-80s, however irregular rhythm. On third manual reading, HR 134.  Started Metoprolol XL 12.5 daily Discontinued propranolol 10 mg BID Transferring patient to [facility] ED for overnight cardiac monitoring and medical clearance  Dispo: Pcp, No  Collateral, Teejay Meader (mom) [phone]   Safety, monitoring and disposition planning: The patient was seen and evaluated on the unit.  The patient's chart was reviewed and nursing notes were reviewed.  The patient's case was discussed in multidisciplinary team  meeting.  Plan and drug side effects were discussed with patient who was amendable. Social work and case management to assist with discharge planning and identification of hospital follow-up needs prior to discharge. Discharge Concerns: Need to establish a safety plan; Medication compliance and effectiveness Discharge Goals: Return home with outpatient referrals for mental health follow-up including medication management/psychotherapy Safety and Monitoring: Voluntary status at inpatient psychiatric unit for safety, stabilization and treatment Daily contact with patient to assess and evaluate symptoms and progress in treatment and medical management Patient's case to be discussed in multi-disciplinary team meeting Observation Level: Per above Vital signs:  q12 hours Precautions: suicide   Signed: Princess Bruins, DO Psychiatry Resident, PGY-1 [facility] 05/15/2021, 5:55 PM

## 2021-05-15 NOTE — ED Triage Notes (Signed)
Pt bib [facility] from [facility] c/o CP and brought here for medical clearance. Hx of IV drug use. Per EMS EKG NSR

## 2021-05-15 NOTE — Progress Notes (Signed)
Pt is transported [facility]. Pt ambulatory, alert and oriented at the time.

## 2021-05-15 NOTE — ED Notes (Signed)
RN walked pt and sitter to [employer] outside

## 2021-05-15 NOTE — ED Notes (Signed)
Safe Transport contacted  

## 2021-05-15 NOTE — ED Triage Notes (Signed)
Pt sent back to ED from [facility], accompanied by staff from there, for eval of tachycardia. D/c from this facility to [facility] early this morning. Pt denies cp.

## 2021-05-15 NOTE — ED Provider Notes (Signed)
[facility] EMERGENCY DEPARTMENT Provider Note   CSN: [id number] Arrival date & time: 05/15/21  0117     History  Chief Complaint  Patient presents with   Chest Pain    Cody Poole is a 26 y.o. male who presents emergency department with chief complaint of chest pain.  Patient is currently a patient in the behavioral health hospital.  He has a history of drug misuse including IV heroin abuse.  His last use of methamphetamine was about 4 days ago.  Today the patient started having sharp intermittent chest pain lasting seconds at a time.  He states that his chest feels better now.  He was sent over for further evaluation by the behavioral health team.  He has no other complaints except for some pain on the top of his knee he thinks he has a recurrent MRSA infection.  He denies fevers.   Chest Pain     Home Medications Prior to Admission medications   Medication Sig Start Date End Date Taking? Authorizing Provider  haloperidol (HALDOL) 10 MG tablet Take 1 tablet (10 mg total) by mouth 2 (two) times daily. 05/10/21   Hayden Rasmussen, MD  methocarbamol (ROBAXIN) 500 MG tablet Take 1 tablet (500 mg total) by mouth 2 (two) times daily. Patient not taking: Reported on 05/11/2021 10/13/20   Tedd Sias, PA      Allergies    Haldol [haloperidol]    Review of Systems   Review of Systems  Cardiovascular:  Positive for chest pain.   Physical Exam Updated Vital Signs BP (!) 135/96 (BP Location: Right Arm)    Pulse (!) 104    Temp (!) 97.5 F (36.4 C) (Oral)    Resp 20    Ht 6' (1.829 m)    Wt 76.2 kg    SpO2 100%    BMI 22.78 kg/m  Physical Exam Vitals and nursing note reviewed.  Constitutional:      General: He is not in acute distress.    Appearance: He is well-developed. He is not diaphoretic.  HENT:     Head: Normocephalic and atraumatic.  Eyes:     General: No scleral icterus.    Conjunctiva/sclera: Conjunctivae normal.  Cardiovascular:     Rate and  Rhythm: Normal rate and regular rhythm.     Heart sounds: Normal heart sounds.  Pulmonary:     Effort: Pulmonary effort is normal. No respiratory distress.     Breath sounds: Normal breath sounds.  Abdominal:     Palpations: Abdomen is soft.     Tenderness: There is no abdominal tenderness.  Musculoskeletal:     Cervical back: Normal range of motion and neck supple.  Skin:    General: Skin is warm and dry.     Comments: Area of tender erythema over the right knee with central pustule  Neurological:     Mental Status: He is alert.  Psychiatric:        Behavior: Behavior normal.    ED Results / Procedures / Treatments   Labs (all labs ordered are listed, but only abnormal results are displayed) Labs Reviewed  HEPATITIS PANEL, ACUTE - Abnormal; Notable for the following components:      Result Value   HCV Ab Reactive (*)    All other components within normal limits  HEPATIC FUNCTION PANEL  HIV ANTIBODY (ROUTINE TESTING W REFLEX)  RPR  HCV RNA DIAGNOSIS, NAA  GC/CHLAMYDIA PROBE AMP ([facility]) NOT AT [facility]  EKG None  Radiology No results found.  Procedures Procedures    Medications Ordered in ED Medications  cloNIDine (CATAPRES) tablet 0.1 mg (0.1 mg Oral Given 05/12/21 1656)    Followed by  cloNIDine (CATAPRES) tablet 0.1 mg (0.1 mg Oral Given 05/14/21 1613)    Followed by  cloNIDine (CATAPRES) tablet 0.1 mg (has no administration in time range)  dicyclomine (BENTYL) tablet 20 mg (20 mg Oral Given 05/14/21 2321)  hydrOXYzine (ATARAX) tablet 25 mg (25 mg Oral Given 05/14/21 0908)  loperamide (IMODIUM) capsule 2-4 mg (has no administration in time range)  methocarbamol (ROBAXIN) tablet 500 mg (500 mg Oral Given 05/14/21 2012)  naproxen (NAPROSYN) tablet 500 mg (500 mg Oral Given 05/14/21 1617)  nicotine (NICODERM CQ - dosed in mg/24 hours) patch 21 mg (21 mg Transdermal Patch Applied 05/14/21 1614)  ondansetron (ZOFRAN-ODT) disintegrating tablet 4 mg (4 mg Oral Given 05/14/21  0909)  diphenhydrAMINE (BENADRYL) injection 50 mg (50 mg Intravenous Not Given 05/12/21 1418)  diphenhydrAMINE (BENADRYL) capsule 50 mg (has no administration in time range)    Or  diphenhydrAMINE (BENADRYL) injection 50 mg (has no administration in time range)  EPINEPHrine (EPI-PEN) injection 0.3 mg (has no administration in time range)  OLANZapine (ZYPREXA) tablet 10 mg (10 mg Oral Given 05/14/21 2126)  feeding supplement (ENSURE ENLIVE / ENSURE PLUS) liquid 237 mL (237 mLs Oral Given 05/14/21 1615)  OLANZapine zydis (ZYPREXA) disintegrating tablet 5 mg (has no administration in time range)    And  LORazepam (ATIVAN) tablet 1 mg (has no administration in time range)    And  ziprasidone (GEODON) injection 20 mg (has no administration in time range)  sodium chloride (OCEAN) 0.65 % nasal spray 1 spray (1 spray Each Nare Given 05/14/21 0905)  traZODone (DESYREL) tablet 50 mg (50 mg Oral Given 05/14/21 2126)  white petrolatum (VASELINE) gel (  Given 05/11/21 1730)  influenza vac split quadrivalent PF (FLUARIX) injection 0.5 mL (0.5 mLs Intramuscular Given 05/12/21 1518)  diphenhydrAMINE (BENADRYL) injection 50 mg (50 mg Intramuscular Given 05/12/21 1417)  traZODone (DESYREL) tablet 50 mg (50 mg Oral Given 05/12/21 2324)  acetaminophen (TYLENOL) tablet 650 mg (650 mg Oral Given 05/12/21 2324)  LORazepam (ATIVAN) tablet 1 mg (1 mg Oral Given 05/12/21 2359)  traZODone (DESYREL) tablet 50 mg (50 mg Oral Given 05/14/21 0158)  acetaminophen (TYLENOL) tablet 650 mg (650 mg Oral Given 05/14/21 0158)    ED Course/ Medical Decision Making/ A&P Clinical Course as of 05/16/21 1240  Thu May 15, 2021  2041 Patient's TSH is slightly elevated [JK]  2041 Troponin this afternoon normal at 4 [JK]  2131 Second troponin is normal [JK]  2131 Repeat heart rate is 84 [JK]    Clinical Course User Index [JK] Dorie Rank, MD                           Medical Decision Making Amount and/or Complexity of Data Reviewed Labs:  ordered. Radiology: ordered.  Risk OTC drugs. Prescription drug management.    This patient presents to the ED for concern of chest pain, this involves an extensive number of treatment options, and is a complaint that carries with it a high risk of complications and morbidity.  The differential diagnosis includes The emergent differential diagnosis of chest pain includes: Acute coronary syndrome, pericarditis, aortic dissection, pulmonary embolism, tension pneumothorax, pneumonia, and esophageal rupture.    Co morbidities that complicate the patient evaluation  Hx IVDU  Lab Tests:  I Ordered, and personally interpreted labs.  The pertinent results include:  No acute findings  trop negative x2, negative d-dimer, no leukocytosis   Imaging Studies ordered:  I ordered imaging studies including 1 v cxr I independently visualized and interpreted imaging which showed no acute findings I agree with the radiologist interpretation   Cardiac Monitoring:  The patient was maintained on a cardiac monitor.  I personally viewed and interpreted the cardiac monitored which showed an underlying rhythm of: sinus tachycardia   Medicines ordered and prescription drug management:       Reevaluation:  After the interventions noted above, I reevaluated the patient and found that they have :improved   Dispostion:  After consideration of the diagnostic results and the patients response to treatment, I feel that the patent would benefit from return to [facility].Patient here with atypical chest pain complaint, sharp, fleeting and resolved prior to my evaluation.    Patient notably tachycardic here in the emergency department however review of the patient's vital signs over the past 4 days shows he has had a variable heart rate up into the 150s at times.  He has no murmur suggestive of endocarditis.  He is afebrile.  He is medically cleared to return to behavioral health    Final Clinical  Impression(s) / ED Diagnoses Final diagnoses:  None    Rx / DC Orders ED Discharge Orders          Ordered    nicotine (NICODERM CQ - DOSED IN MG/24 HOURS) 21 mg/24hr patch  Daily        Pending              Margarita Mail, PA-C 05/16/21 1354    Fatima Blank, MD 05/17/21 [phone]

## 2021-05-16 ENCOUNTER — Encounter (HOSPITAL_COMMUNITY): Payer: Self-pay

## 2021-05-16 DIAGNOSIS — F209 Schizophrenia, unspecified: Principal | ICD-10-CM

## 2021-05-16 MED ORDER — METOPROLOL TARTRATE 25 MG PO TABS: 25.0000 mg | ORAL_TABLET | Freq: Two times a day (BID) | ORAL | 0 refills | Status: DC

## 2021-05-16 MED ORDER — DOXYCYCLINE HYCLATE 100 MG PO CAPS: 100.0000 mg | ORAL_CAPSULE | Freq: Two times a day (BID) | ORAL | 0 refills | Status: AC

## 2021-05-16 MED ORDER — OLANZAPINE 10 MG PO TABS: 10.0000 mg | ORAL_TABLET | Freq: Every day | ORAL | 0 refills | Status: DC

## 2021-05-16 MED ORDER — GABAPENTIN 300 MG PO CAPS: 300.0000 mg | ORAL_CAPSULE | Freq: Three times a day (TID) | ORAL | 0 refills | Status: DC

## 2021-05-16 MED ORDER — DOXYCYCLINE HYCLATE 100 MG PO TABS
100.0000 mg | ORAL_TABLET | Freq: Two times a day (BID) | ORAL | Status: DC
  Administered 2021-05-16: 100 mg via ORAL
  Filled 2021-05-16 (×4): qty 1

## 2021-05-16 MED ORDER — METOPROLOL TARTRATE 25 MG PO TABS
25.0000 mg | ORAL_TABLET | Freq: Two times a day (BID) | ORAL | Status: DC
  Administered 2021-05-16: 25 mg via ORAL
  Filled 2021-05-16 (×4): qty 1

## 2021-05-16 NOTE — Progress Notes (Signed)
°  [facility] Adult Case Management Discharge Plan :  Will you be returning to the same living situation after discharge:  Yes,  Living with mother At discharge, do you have transportation home?: No. Will need to use [employer] Do you have the ability to pay for your medications: Yes,  resources provided for financial assistance  Release of information consent forms completed and in the chart;  Patient's signature needed at discharge.  Patient to Follow up at:  Follow-up Information     Services, [facility]. Go on 05/22/2021.   Why: You have a hospital follow up appointment for therapy services on 05/22/21 at 9:00 am.  Substance abuse intensive outpatient therapy services are also available (if interested, please inquire). Contact information: [address] [place] [address] [phone]         [facility], [facility]. Go on 06/09/2021.   Why: You have an appointment for medication management services on 06/09/21 at 3:00 pm.  This appointment will be held in person. Contact information: [address] [place] [address] [phone]         [facility]. Go on 06/05/2021.   Why: You have an appointment for primary care services on 06/05/21 at 1:50 pm.  This appointment will be held in person.  * New Address:  [address], [place], [place]. Contact information: [address] [place] [address] [phone]                Next level of care provider has access to [facility] Link:no  Safety Planning and Suicide Prevention discussed: Yes,  Mother     Has patient been referred to the Quitline?: Yes, faxed on 05/16/2021  Patient has been referred for addiction treatment: Yes  Izack Hoogland E Claudell Rhody, LCSW 05/16/2021, 10:08 AM

## 2021-05-16 NOTE — Progress Notes (Signed)
Patient has returned back to [facility] from being medically cleared at [facility] emergency department.  Per chart review, after patient presented back to [facility] yesterday on 05/15/2021 after being medically cleared at [name] ED for chest pain and potential soft tissue/skin infection of right knee, patient was transferred back to [facility] ED a second time for medical clearance for tachycardia.  Per chart review, patient had TSH done while in the ED on 05/15/2021 and it was mildly elevated at 5.604 uIU/mL.  This TSH value is mentioned in 05/15/21 EDP note as potential contributory factor to patient's tachycardia.  Per chart review, patient did not show any evidence of any emergent thyroid condition such as thyroid storm in the ED and patient was ultimately medically cleared in the emergency department with initiation of metoprolol 25 mg p.o. twice daily (see details below) and recommendation for outpatient follow-up regarding his TSH value. Per chart review of 05/15/2021 [facility] progress note, patient was initiated on metoprolol XL 12.5 mg p.o. daily for tachycardia and patient's propranolol 10 mg twice daily was discontinued at [facility] prior to patient being transferred back to [facility] ED for the second time yesterday on 05/15/2021.  Per chart review of Loraine Leriche, it does not appear that patient has received any dosages of metoprolol XL 12.5 mg p.o. daily thus far.  Per chart review, during patient's most recent encounter of medical clearance at [facility] emergency department on the evening of 05/15/2021, patient received a one-time dose of metoprolol tartrate (Lopressor) 25 mg at 1833 on 05/15/2021 and patient was initiated on Lopressor 25 mg p.o. twice daily by EDP in the emergency department (chart review shows that patient was provided with 1 month supply ambulatory prescription for Lopressor 25 mg p.o. twice daily-60 tablets, 0 refills).  Chart review also shows that during previous [facility], ED encounter on the morning of 05/15/2021,  the patient received a one-time dose of doxycycline 100 mg p.o. and patient was provided with ambulatory prescription for doxycycline 100 mg PO BID x 7 days by EDP (although it is not documented in EDP note from earlier [facility], ED encounter on the morning of 05/15/2021, I suspect that this prescription was initiated for potential soft tissue/skin infection of patient's right knee (see this provider's 05/14/2021 note regarding patient's evaluation at that time for details if necessary).  Per chart review, it does not appear that patient was initiated on any additional medications in the emergency department.  Due to the medication initiations in the emergency department noted above, will discontinue patient's order of metoprolol XL 12.5 mg p.o. daily that was initiated yesterday at [facility] on 05/15/2021 prior to patient being reevaluated in the emergency department and will place order to start Lopressor 25 mg p.o. twice daily that patient was initiated on/prescribed in the emergency department during most recent 05/15/21 [facility] ED visit for tachycardia.  Furthermore, will also place order to start doxycycline 100 mg p.o. every 12 hours/twice daily for 7 days that patient was initiated on/prescribed in the emergency department as well to cover for likely potential cellulitis/soft tissue/dermatologic infection of patient's right knee/right patellar area.  Based on documentation from recent 05/15/21 ED visit, it appears that patient is stable to follow up as an outpatient for further treatment regarding his TSH level/any potential thyroid condition.  Will defer to dayshift treatment team regarding further recommendations of necessity for follow-up thyroid work-up (such as free T3 or free T4 testing) during patient's [facility] inpatient psychiatric admission at this  time.

## 2021-05-16 NOTE — Plan of Care (Signed)
Nursing discharge note: Patient discharged home per MD order.  Patient received all personal belongings from unit and locker.  Reviewed AVS/transition record with patient and he indicateds understanding.  Patient will follow up with [facility].  Patient denies any thoughts of self harm.  He left with transportation (uber) to [place]. Patient left with a supply of his medications.

## 2021-05-16 NOTE — Progress Notes (Signed)
°   05/16/21 0900  Psych Admission Type (Psych Patients Only)  Admission Status Voluntary  Psychosocial Assessment  Patient Complaints Anxiety  Eye Contact Brief  Facial Expression Anxious  Affect Blunted  Speech Logical/coherent  Interaction Assertive  Motor Activity Fidgety;Restless  Appearance/Hygiene Unremarkable  Behavior Characteristics Cooperative  Mood Anxious  Thought Process  Coherency WDL  Content WDL  Delusions None reported or observed  Perception WDL  Hallucination None reported or observed  Judgment Impaired  Confusion None  Danger to Self  Current suicidal ideation? Denies  Danger to Others  Danger to Others None reported or observed

## 2021-05-16 NOTE — BH IP Treatment Plan (Signed)
Interdisciplinary Treatment and Diagnostic Plan Update  05/16/2021 Neri Samek MRN: [id number]  Principal Diagnosis: Schizophrenia [facility])  Secondary Diagnoses: Principal Problem:   Schizophrenia (HCC) Active Problems:   Stimulant abuse (HCC)   Cannabis abuse   Opioid use disorder   PTSD (post-traumatic stress disorder)   IVDU (intravenous drug user)   Current Medications:  Current Facility-Administered Medications  Medication Dose Route Frequency Provider Last Rate Last Admin   diphenhydrAMINE (BENADRYL) capsule 50 mg  50 mg Oral Q6H PRN Princess Bruins, DO       Or   diphenhydrAMINE (BENADRYL) injection 50 mg  50 mg Intramuscular Q6H PRN Princess Bruins, DO       diphenhydrAMINE (BENADRYL) injection 50 mg  50 mg Intravenous Once Massengill, Harrold Donath, MD       doxycycline (VIBRA-TABS) tablet 100 mg  100 mg Oral Q12H Melbourne Abts W, PA-C   100 mg at 05/16/21 0813   EPINEPHrine (EPI-PEN) injection 0.3 mg  0.3 mg Intramuscular PRN Princess Bruins, DO       feeding supplement (ENSURE ENLIVE / ENSURE PLUS) liquid 237 mL  237 mL Oral BID BM Massengill, Harrold Donath, MD   237 mL at 05/16/21 0814   gabapentin (NEURONTIN) capsule 300 mg  300 mg Oral TID Princess Bruins, DO   300 mg at 05/16/21 0813   OLANZapine zydis (ZYPREXA) disintegrating tablet 5 mg  5 mg Oral Q8H PRN Massengill, Harrold Donath, MD       And   LORazepam (ATIVAN) tablet 1 mg  1 mg Oral PRN Massengill, Harrold Donath, MD       And   ziprasidone (GEODON) injection 20 mg  20 mg Intramuscular PRN Massengill, Harrold Donath, MD       metoprolol tartrate (LOPRESSOR) tablet 25 mg  25 mg Oral BID Melbourne Abts W, PA-C   25 mg at 05/16/21 0813   nicotine (NICODERM CQ - dosed in mg/24 hours) patch 21 mg  21 mg Transdermal Daily Ophelia Shoulder E, NP   21 mg at 05/16/21 0812   OLANZapine (ZYPREXA) tablet 10 mg  10 mg Oral QHS Princess Bruins, DO   10 mg at 05/15/21 2248   sodium chloride (OCEAN) 0.65 % nasal spray 1 spray  1 spray Each Nare PRN Jackelyn Poling, NP   1 spray at  05/14/21 0905   traZODone (DESYREL) tablet 50 mg  50 mg Oral QHS PRN Phineas Inches, MD   50 mg at 05/14/21 2126   PTA Medications: Medications Prior to Admission  Medication Sig Dispense Refill Last Dose   haloperidol (HALDOL) 10 MG tablet Take 1 tablet (10 mg total) by mouth 2 (two) times daily. 60 tablet 0    methocarbamol (ROBAXIN) 500 MG tablet Take 1 tablet (500 mg total) by mouth 2 (two) times daily. (Patient not taking: Reported on 05/11/2021) 20 tablet 0     Patient Stressors: Financial difficulties   Medication change or noncompliance   Substance abuse    Patient Strengths: Average or above average intelligence  Capable of independent living  Communication skills  Physical Health  Supportive family/friends   Treatment Modalities: Medication Management, Group therapy, Case management,  1 to 1 session with clinician, Psychoeducation, Recreational therapy.   Physician Treatment Plan for Primary Diagnosis: Schizophrenia (HCC) Long Term Goal(s):     Short Term Goals:    Medication Management: Evaluate patient's response, side effects, and tolerance of medication regimen.  Therapeutic Interventions: 1 to 1 sessions, Unit Group sessions and Medication administration.  Evaluation of Outcomes: Adequate  for Discharge  Physician Treatment Plan for Secondary Diagnosis: Principal Problem:   Schizophrenia (HCC) Active Problems:   Stimulant abuse (HCC)   Cannabis abuse   Opioid use disorder   PTSD (post-traumatic stress disorder)   IVDU (intravenous drug user)  Long Term Goal(s):     Short Term Goals:       Medication Management: Evaluate patient's response, side effects, and tolerance of medication regimen.  Therapeutic Interventions: 1 to 1 sessions, Unit Group sessions and Medication administration.  Evaluation of Outcomes: Adequate for Discharge   RN Treatment Plan for Primary Diagnosis: Schizophrenia (HCC) Long Term Goal(s): Knowledge of disease and  therapeutic regimen to maintain health will improve  Short Term Goals: Ability to participate in decision making will improve, Ability to verbalize feelings will improve, and Ability to identify and develop effective coping behaviors will improve  Medication Management: RN will administer medications as ordered by provider, will assess and evaluate patient's response and provide education to patient for prescribed medication. RN will report any adverse and/or side effects to prescribing provider.  Therapeutic Interventions: 1 on 1 counseling sessions, Psychoeducation, Medication administration, Evaluate responses to treatment, Monitor vital signs and CBGs as ordered, Perform/monitor CIWA, COWS, AIMS and Fall Risk screenings as ordered, Perform wound care treatments as ordered.  Evaluation of Outcomes: Adequate for Discharge   LCSW Treatment Plan for Primary Diagnosis: Schizophrenia [facility]) Long Term Goal(s): Safe transition to appropriate next level of care at discharge, Engage patient in therapeutic group addressing interpersonal concerns.  Short Term Goals: Engage patient in aftercare planning with referrals and resources, Increase social support, and Increase ability to appropriately verbalize feelings  Therapeutic Interventions: Assess for all discharge needs, 1 to 1 time with Social worker, Explore available resources and support systems, Assess for adequacy in community support network, Educate family and significant other(s) on suicide prevention, Complete Psychosocial Assessment, Interpersonal group therapy.  Evaluation of Outcomes: Adequate for Discharge   Progress in Treatment: Attending groups: Yes. Participating in groups: Yes. Taking medication as prescribed: Yes. Toleration medication: Yes. Family/Significant other contact made: Yes, individual(s) contacted:  father Patient understands diagnosis: Yes. Discussing patient identified problems/goals with staff: Yes. Medical  problems stabilized or resolved: Yes. Denies suicidal/homicidal ideation: Yes. Issues/concerns per patient self-inventory: No. Other: None  New problem(s) identified: No, Describe:  None  New Short Term/Long Term Goal(s): medication stabilization, elimination of SI thoughts, development of comprehensive mental wellness plan.   Patient Goals:   "Get on medications"  Discharge Plan or Barriers: Pt will f/u at [facility] for therapy or SAIOP services. Pt will f/u at [facility] for medication management.   Reason for Continuation of Hospitalization: Depression Medication stabilization Suicidal ideation  Estimated Length of Stay: 3-5 days   Scribe for Treatment Team: Chrys Racer 05/16/2021 10:58 AM
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
## 2021-05-16 NOTE — Discharge Summary (Signed)
Physician Discharge Summary Note  Patient:  Cody Poole is an 26 y.o., male MRN:  [id number] DOB:  23-Nov-1995 Patient phone:  [phone] (home)  Patient address:   [place] [address],   Date of Admission:  05/11/2021 Date of Discharge: 05/16/2021  Reason for Admission:   Per H&P " CC: Command AH, active SI   Cody Poole is a 26 y.o. male with PPHx of schizophrenia, who [facility] for stimulant withdrawal symptoms x1 week then admitted Voluntary to [facility] for treatment of command AH and active SI in the setting of recent IVDU.   Mode of transport to Hospital: [facility] Current Medication List: Haldol 5 mg daily PRN medication prior to evaluation: Bentyl, [name], Robaxin, naproxen, Zofran   ED course:  Patient was medically cleared by [facility].    Collateral Information: Cody Poole (mom) [phone]   Patient evaluation was limited due to acute dysarthria and tongue swelling concerning for allergic reaction.   HPI:  Patient stated that he was admitted to [facility] "because I wanted to get off the streets and I lied about having thoughts of suicide, [name], [name], anhedonia, insomnia, decreased energy, decreased concentration, decreased appetite without weight change, and suicidal ideations and intentions for >2 weeks.  Patient reported current episode started about 2 months ago when patient "went to the streets" and stopped taking his home haldol. Prior, he reported that he was living with his mom, step dad, [name], patient stated that there  has been no change, and that he had been homeless and living in a tent for 2 months.    Patient reported symptoms of psychosis including command AVH involving seeing aliens, that tells him that he is dying, [name], [name], [name], patient declined to elaborate further.   Patient reported symptoms of mania/hypomania in the past, but during the interview today, he was unable to elaborate on specific symptoms he was having during these episodes, or when his last episode was.    Patient reported a history of PTSD, but did not elaborate further on symptoms clusters of ptsd.    Pt reports that anxiety and worry are up and down.    Of note, during evaluation, patient complains of acute tongue swelling, requiring IM bendryl. On later encounter, patient reported similar episode in the past after using Haldol. He denied SOB or any symptoms of respiratory distress.    Past Psychiatric History: Past psych diagnoses: Reported schizophrenia, [name], PTSD Suicide attempts: Denied Psychiatric med trials: Haldol 10 mg twice daily.  Patient reported history of [place] Current outpatient psychiatrist: Dr. Loni Poole at [facility] Current outpatient therapist: Denied History of selective adherence: Yes.      Family Psychiatric History: Completed/attempted suicide: Denied Bipolar spectrum disorder: Denied Schizophrenia spectrum disorder: Father Substance abuse: Denied   Substance Abuse History: Alcohol: Rarely, occasional Nicotine: 1 PPD daily  since 26 years old Illicit drugs: IVDU-meth, cocaine Rx drug abuse: Denied Seizure hx: Denied Others: Benprenorphine-naloxone (2021)   Social History:  Income: [place]:  Homeless "  Principal Problem: Schizophrenia [facility]) Discharge Diagnoses: Principal Problem:   Schizophrenia ([place]) Active Problems:   Stimulant abuse ([place])   Cannabis abuse   Opioid use disorder   PTSD (post-traumatic stress disorder)   IVDU (intravenous drug user)   Past Psychiatric History: Per above  Past Medical History:  Past Medical History:  Diagnosis Date   Anxiety    Back pain    Depression    Hypertension    Paranoid schizophrenia ([place])    History reviewed. No pertinent surgical history. Family History:  Family History  Family history unknown: Yes   Family Psychiatric  History: Per above Social History:  Social History   Substance and Sexual Activity  Alcohol Use No     Social History   Substance and Sexual Activity  Drug Use Yes   Types: IV, Cocaine   Comment: Last use 05/09/20    Social History   Socioeconomic History   Marital status: Single    Spouse name: Not on file   Number of children: Not on file   Years of education: Not on file   Highest education level: Not on file  Occupational History   Not on file  Tobacco Use   Smoking status: Every Day   Smokeless tobacco: Never  Vaping Use   Vaping Use: Never used  Substance and Sexual Activity   Alcohol use: No   Drug use: Yes    Types: IV, Cocaine    Comment: Last use 05/09/20   Sexual activity: Not on file    Comment: heroin, crack cocaine  Other Topics Concern   Not on file  Social History Narrative   Not on file   Social Determinants of Health   Financial Resource Strain: Not on file  Food Insecurity: Not on file  Transportation Needs: Not on file  Physical Activity: Not on file  Stress: Not on file  Social Connections: Not on file    Hospital Course:   Cody Poole is a 27 y.o. male with PPHx of schizophrenia, who [facility] for stimulant withdrawal symptoms x1 week then admitted Voluntary to [place] for treatment of command AH and active SI in the  setting of recent IVDU.  Collateral Information: Cody Poole (mom) [phone]  Psychiatric diagnoses provided upon initial assessment: Principal Problem:   Schizophrenia ([place]) Active Problems:   Stimulant abuse ([place])   Cannabis abuse   Opioid use disorder   PTSD (post-traumatic stress disorder)  Psychiatric diagnoses provided upon discharge: Principal Problem:   Schizophrenia ([place]) Active Problems:   Stimulant abuse ([place])   Cannabis abuse   Opioid use disorder   PTSD (post-traumatic stress disorder)   IVDU (intravenous drug user)   Patient's psychiatric medications were adjusted on admission:  Home med per chart: Haldol 10 mg BID Started then discontinued Haldol 5 mg given tongue swelling Discontinued home Haldol 5 mg daily Started Zyprexa 5 mg nightly Started clonidine 0.1 mg BID along with other opiate withdrawal PRNS  During the hospitalization, other adjustments were made to the patient's psychiatric medication regimen:  Zyprexa was increased to 10 mg nightly Started Gabapentin 300 mg 3 times daily Started metoprolol and increased to 25 mg twice daily for sinus tachycardia Started doxycycline 100 mg twice daily x7 days for cellulitis  Gradually, patient started adjusting to  milieu.   Patient's care was discussed during the interdisciplinary team meeting every day during the hospitalization.   The patient had allergic reaction to haldol that resovled with IM benadryl. He otherwise denies having side effects to prescribed psychiatric medication.   The patient reports their target psychiatric symptoms of command auditory hallucinations and suicidal thoughts, all responded well to the psychiatric medications, and the patient reports overall benefit other psychiatric hospitalization. Supportive psychotherapy was provided to the patient. The patient also participated in regular group therapy while admitted.    Labs were reviewed with the patient, and abnormal results were  discussed with the patient.   The patient denied having suicidal thoughts more than 48 hours prior to discharge.  Patient denies having homicidal thoughts.   Pt reports that AH are quieter and not commanding. He reports that AH are supportive and at baseline. He reports VH are at baseline. Patient denies having paranoid thoughts.   The patient is able to verbalize their individual safety plan to this provider.   It is recommended to the patient to continue psychiatric medications as prescribed, after discharge from the hospital.     It is recommended to the patient to follow up with your outpatient psychiatric provider and PCP.   Discussed with the patient, the impact of alcohol, drugs, tobacco have been there overall psychiatric and medical wellbeing, and total abstinence from substance use was recommended the patient.   Physical Findings: AIMS: Facial and Oral Movements Muscles of Facial Expression: None, normal Lips and Perioral Area: None, normal Jaw: None, normal Tongue: None, normal,Extremity Movements Upper (arms, wrists, hands, fingers): None, normal Lower (legs, knees, ankles, toes): None, normal, Trunk Movements Neck, shoulders, hips: None, normal, Overall Severity Severity of abnormal movements (highest score from questions above): None, normal Incapacitation due to abnormal movements: None, normal Patient's awareness of abnormal movements (rate only patient's report): No Awareness, Dental Status Current problems with teeth and/or dentures?: No Does patient usually wear dentures?: No  CIWA:    COWS:  COWS Total Score: 7  Musculoskeletal: Strength & Muscle Tone: within normal limits Gait & Station: normal Patient leans: N/A   Psychiatric Specialty Exam:  Presentation  General Appearance: Appropriate for Environment; Casual; Fairly Groomed  Eye Contact:Good  Speech:Normal Rate  Speech Volume:Normal  Handedness:Right   Mood and Affect   Mood:Euthymic  Affect:Appropriate; Full Range; Congruent   Thought Process  Thought Processes:Linear  Descriptions of Associations:Intact  Orientation:Full (Time, Place and Person)  Thought Content:Rumination; Perseveration (Denied ideas of reference, first rank symptoms)  History of Schizophrenia/Schizoaffective disorder:Yes  Duration of Psychotic Symptoms:Greater than six months  Hallucinations:Hallucinations: Auditory; Visual Description of Auditory Hallucinations: Denied Description of Visual Hallucinations: Aliens, monsters, Animal nutritionist of Reference:None  Suicidal Thoughts:Suicidal Thoughts: No  Homicidal Thoughts:Homicidal Thoughts: No   Sensorium  Memory:Immediate Good; Recent Good; Remote Good  Judgment:Good  Insight:Good   Executive Functions  Concentration:Good  Attention Span:Good  [place] of Knowledge:Good  Language:Good   Psychomotor Activity  Psychomotor Activity:Psychomotor Activity: Normal Extrapyramidal Side Effects (EPS): -- (None) AIMS Completed?: Yes (score zero on 05-16-2021)   Assets  Assets:Desire for Improvement; Housing; Intimacy; Leisure Time   Sleep  Sleep:Sleep: Good    Physical Exam: Physical Exam Vitals and nursing note reviewed.  Constitutional:      General: He is awake. He is not in acute distress.    Appearance: He is normal weight. He is not ill-appearing or diaphoretic.  HENT:     Head: Normocephalic.  Pulmonary:  Effort: Pulmonary effort is normal.  Neurological:     General: No focal deficit present.     Mental Status: He is alert and oriented to person, place, and time.     Motor: No weakness.     Gait: Gait normal.  Psychiatric:        Behavior: Behavior normal. Behavior is not agitated. Behavior is cooperative.        Thought Content: Thought content normal.        Judgment: Judgment normal.   Review of Systems  Constitutional:  Negative for chills and fever.   Cardiovascular:  Negative for chest pain and palpitations.  Neurological:  Negative for dizziness, tingling, tremors and headaches.  Psychiatric/Behavioral:  Positive for substance abuse. Negative for depression, hallucinations, memory loss and suicidal ideas. The patient is not nervous/anxious and does not have insomnia.   All other systems reviewed and are negative. Blood pressure 120/88, pulse (!) 135, temperature 98 F (36.7 C), resp. rate 18, height 6' (1.829 m), weight 76.2 kg, SpO2 100 %. Body mass index is 22.78 kg/m.   Social History   Tobacco Use  Smoking Status Every Day  Smokeless Tobacco Never   Tobacco Cessation:  N/A, patient does not currently use tobacco products   Blood Alcohol level:  Lab Results  Component Value Date   ETH <10 05/10/2021   ETH <10 [id number]    Metabolic Disorder Labs:  No results found for: HGBA1C, MPG No results found for: PROLACTIN No results found for: CHOL, TRIG, HDL, CHOLHDL, VLDL, LDLCALC  See Psychiatric Specialty Exam and Suicide Risk Assessment completed by Attending Physician prior to discharge.  Discharge destination:  Home  Is patient on multiple antipsychotic therapies at discharge:  No   Has Patient had three or more failed trials of antipsychotic monotherapy by history:  No  Recommended Plan for Multiple Antipsychotic Therapies: NA  Discharge Instructions     Diet - low sodium heart healthy   Complete by: As directed    Increase activity slowly   Complete by: As directed       Allergies as of 05/16/2021       Reactions   Haldol [haloperidol] Other (See Comments)   Tongue swelling and dysarthria, relieved with benadryl        Medication List     STOP taking these medications    haloperidol 10 MG tablet Commonly known as: HALDOL   methocarbamol 500 MG tablet Commonly known as: ROBAXIN       TAKE these medications      Indication  doxycycline 100 MG capsule Commonly known as: VIBRAMYCIN Take  1 capsule (100 mg total) by mouth 2 (two) times daily for 7 days. One po bid x 7 days  Indication: Infection Under the Skin, cellu   gabapentin 300 MG capsule Commonly known as: NEURONTIN Take 1 capsule (300 mg total) by mouth 3 (three) times daily.  Indication: Abuse or Misuse of Alcohol, Alcohol Withdrawal Syndrome, anxiety   metoprolol tartrate 25 MG tablet Commonly known as: LOPRESSOR Take 1 tablet (25 mg total) by mouth 2 (two) times daily.  Indication: Sinus tachycardia   OLANZapine 10 MG tablet Commonly known as: ZYPREXA Take 1 tablet (10 mg total) by mouth at bedtime.  Indication: psychosis        Follow-up Information     [facility]. Go on 05/22/2021.   Why: You have a hospital follow up appointment for therapy [facility] on 05/22/21 at 9:00 am.  Substance abuse  intensive outpatient therapy Po[facility] are also available (if interested, please inquire). Contact information: [place] [address] [phone]         [facility], [facility]. Go on 06/09/2021.   Why: You have an appointment for medication management Poole on 06/09/21 at 3:00 pm.  This appointment will be held in person. Contact information: [place] [address] [address]. Go on 06/05/2021.   Why: You have an appointment for primary care Poole on 06/05/21 at 1:50 pm.  This appointment will be held in person.  * New Address:  [id number], [place] [place], [place]. Contact information: [place] [phone]                Plan Of Care/Follow-up recommendations:  Activity: as tolerated   Diet: heart healthy   Other: -Follow-up with your outpatient psychiatric provider -instructions on appointment date, time, and address (location) are provided to you in discharge paperwork.   -Take your psychiatric medications as  prescribed at discharge - instructions are provided to you in the discharge paperwork   -Follow-up with outpatient primary care doctor and other specialists -for management of chronic medical disease, including: tachycardia, suspected thyroid dysfunction, and hepatitis C infection (appointment information provided in your discharge packet)    -Testing: Follow-up with outpatient provider for abnormal lab results:  05-15-2021: TSH 5.604  WBC 12.8 BUN 23   05-13-2021: Hepatitis C Ab reactive  HIV negative    -Recommend abstinence from alcohol, tobacco, and other illicit drug use at discharge.    -If your psychiatric symptoms recur, worsen, or if you have side effects to your psychiatric medications, call your outpatient psychiatric provider, 911, [phone] or go to the nearest emergency department.   -If suicidal thoughts recur, call your outpatient psychiatric provider, 911, [phone] or go to the nearest emergency department.  Signed: Merrily Brittle, DO 05/16/2021, 6:09 PM

## 2021-06-04 NOTE — Progress Notes (Unsigned)
Patient ID: Cody Poole, male   DOB: 1995/06/30, 26 y.o.   MRN: [id number] Date of Admission:  05/11/2021 Date of Discharge: 05/16/2021   Reason for Admission:   Per H&P " CC: Command AH, active SI   Cody Poole is a 26 y.o. male with PPHx of schizophrenia, who [facility] for stimulant withdrawal symptoms x1 week then admitted [facility]untary to [facility] for treatment of command AH and active SI in the setting of recent IVDU.   Mode of transport to Hospital: [facility] Current Medication List: Haldol 5 mg daily PRN medication prior to evaluation: Bentyl, [name], Robaxin, naproxen, Zofran   ED course:  Patient was medically cleared by [facility].   HPI:  Patient stated that he was admitted to [facility] "because I wanted to get off the streets and I lied about having thoughts of suicide, [name], [name], anhedonia, insomnia, decreased energy, decreased concentration, decreased appetite without weight change, and suicidal ideations and intentions for >2 weeks.  Patient reported current episode started about 2 months ago when patient "went to the streets" and stopped taking his home haldol. Prior, he reported that he was living with his mom, step dad, [name], patient stated that there has been no change, and that he had been homeless and living in a tent for 2 months.    Patient reported symptoms of psychosis including command AVH involving seeing aliens, that tells him that he is dying, along with voices and monsters. Patient stated that the hallucination have been exacerbated for the past  week, [name], [name], patient declined to elaborate further.   Patient reported symptoms of mania/hypomania in the past, but during the interview today, he was unable to elaborate on specific symptoms he was having during these episodes, or when his last episode was.    Patient reported a history of PTSD, but did not elaborate further on symptoms clusters of ptsd.    Pt reports that anxiety and worry are up and down.    Of note, during evaluation, patient complains of acute tongue swelling, requiring IM bendryl. On later encounter, patient reported similar episode in the past after using Haldol. He denied SOB or any symptoms of respiratory distress.    Past Psychiatric History: Past psych diagnoses: Reported schizophrenia, [name], PTSD Suicide attempts: Denied Psychiatric med trials: Haldol 10 mg twice daily.  Patient reported history of [place] Current outpatient psychiatrist: Dr. Loni Muse at [facility] Current outpatient therapist: Denied History of selective adherence: Yes.      Family Psychiatric History: Completed/attempted suicide: Denied Bipolar spectrum disorder: Denied Schizophrenia spectrum disorder: Father Substance abuse: Denied   Substance Abuse History: Alcohol: Rarely, occasional Nicotine: 1 PPD daily since 26 years old Illicit drugs: IVDU-meth, cocaine Rx drug abuse: Denied Seizure hx: Denied Others: Benprenorphine-naloxone (2021)   Social History:  Income: [place]: Homeless "   Principal Problem: Schizophrenia [facility]) Discharge Diagnoses: Principal Problem:   Schizophrenia ([place]) Active Problems:   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

## 2021-06-05 ENCOUNTER — Inpatient Hospital Stay: Payer: Medicaid Other | Admitting: Physician Assistant

## 2021-06-28 ENCOUNTER — Other Ambulatory Visit: Payer: Self-pay

## 2021-06-28 ENCOUNTER — Encounter (HOSPITAL_COMMUNITY): Payer: Self-pay

## 2021-06-28 ENCOUNTER — Emergency Department (HOSPITAL_COMMUNITY)
Admission: EM | Admit: 2021-06-28 | Discharge: 2021-06-28 | Discharge status: Against Medical Advice | Payer: Medicaid Other | Attending: Emergency Medicine | Admitting: Emergency Medicine

## 2021-06-28 DIAGNOSIS — R443 Hallucinations, unspecified: Secondary | ICD-10-CM

## 2021-06-28 DIAGNOSIS — R440 Auditory hallucinations: Secondary | ICD-10-CM | POA: Insufficient documentation

## 2021-06-28 DIAGNOSIS — I10 Essential (primary) hypertension: Secondary | ICD-10-CM | POA: Insufficient documentation

## 2021-06-28 DIAGNOSIS — F1210 Cannabis abuse, uncomplicated: Secondary | ICD-10-CM | POA: Insufficient documentation

## 2021-06-28 DIAGNOSIS — R000 Tachycardia, unspecified: Secondary | ICD-10-CM | POA: Insufficient documentation

## 2021-06-28 NOTE — ED Triage Notes (Signed)
BIB EMS for A/V hallucinations, states he has been off his schizophrenia meds for 3 days, pt claims to have parasites in his abd and needs abx and wants psychiatric medications ?

## 2021-06-28 NOTE — ED Notes (Signed)
Has knife but refuses to give it up ?

## 2021-06-28 NOTE — ED Notes (Signed)
Pt states that he is leaving and does not feel safe here. He was encouraged to stay and complete care. He ambulated out of the ED in no distress. MD made aware.  ?

## 2021-06-28 NOTE — ED Provider Notes (Signed)
?[facility]-EMERGENCY DEPT ?Provider Note ? ?CSN: [id number] ?Arrival date & time: 06/28/21 0024 ? ?Chief Complaint(s) ?Hallucinations ? ?HPI ?Cody Poole is a 26 y.o. male with a past medical history listed below including IV drug use/stimulant use, PTSD, schizophrenia who called EMS to "get a ride out of the rain."  Has a known history of schizophrenia and reports auditory hallucinations.  Stating that he hears the [employer] in his ear.  They are not command and not telling him to commit suicide or hurt other people.  He denies any physical complaints other than itching which she attributes to living underneath overpass.  Patient admits to using methamphetamine earlier in the day.  ? ?HPI ? ?Past Medical History ?Past Medical History:  ?Diagnosis Date  ? Anxiety   ? Back pain   ? Depression   ? Hypertension   ? Paranoid schizophrenia (HCC)   ? ?Patient Active Problem List  ? Diagnosis Date Noted  ? IVDU (intravenous drug user) 05/14/2021  ? Stimulant abuse (HCC) 05/12/2021  ? Cannabis abuse 05/12/2021  ? Opioid use disorder 05/12/2021  ? PTSD (post-traumatic stress disorder) 05/12/2021  ? Schizophrenia (HCC) 05/11/2021  ? ?Home Medication(s) ?Prior to Admission medications   ?Medication Sig Start Date End Date Taking? Authorizing Provider  ?gabapentin (NEURONTIN) 300 MG capsule Take 1 capsule (300 mg total) by mouth 3 (three) times daily. 05/16/21 06/15/21  Princess Bruins, DO  ?metoprolol tartrate (LOPRESSOR) 25 MG tablet Take 1 tablet (25 mg total) by mouth 2 (two) times daily. 05/16/21 06/15/21  Princess Bruins, DO  ?OLANZapine (ZYPREXA) 10 MG tablet Take 1 tablet (10 mg total) by mouth at bedtime. 05/16/21 06/15/21  Princess Bruins, DO  ?                                                                                                                                  ?Allergies ?Haldol [haloperidol] ? ?Review of Systems ?Review of Systems ?As noted in HPI ? ?Physical Exam ?Vital Signs  ?I have reviewed the triage  vital signs ?BP (!) 153/103 (BP Location: Right Arm)   Pulse (!) 111   Temp 99 ?F (37.2 ?C) (Oral)   Resp (!) 22   Ht 6\' 1"  (1.854 m)   Wt 81.6 kg   SpO2 97%   BMI 23.75 kg/m?  ? ?Physical Exam ?Vitals reviewed.  ?Constitutional:   ?   General: He is not in acute distress. ?   Appearance: He is well-developed. He is not diaphoretic.  ?HENT:  ?   Head: Normocephalic and atraumatic.  ?   Right Ear: External ear normal.  ?   Left Ear: External ear normal.  ?   Nose: Nose normal.  ?   Mouth/Throat:  ?   Mouth: Mucous membranes are moist.  ?Eyes:  ?   General: No scleral icterus. ?   Conjunctiva/sclera: Conjunctivae normal.  ?Neck:  ?   Trachea: Phonation normal.  ?  Cardiovascular:  ?   Rate and Rhythm: Regular rhythm. Tachycardia present.  ?Pulmonary:  ?   Effort: Pulmonary effort is normal. No respiratory distress.  ?   Breath sounds: No stridor.  ?Abdominal:  ?   General: There is no distension.  ?Musculoskeletal:     ?   General: Normal range of motion.  ?   Cervical back: Normal range of motion.  ?Neurological:  ?   Mental Status: He is alert and oriented to person, place, and time.  ?Psychiatric:     ?   Attention and Perception: He is attentive. He perceives auditory hallucinations.     ?   Mood and Affect: Mood normal.     ?   Speech: Speech normal.     ?   Behavior: Behavior normal.     ?   Thought Content: Thought content is not delusional. Thought content does not include homicidal or suicidal ideation.  ? ? ?ED Results and Treatments ?Labs ?(all labs ordered are listed, but only abnormal results are displayed) ?Labs Reviewed - No data to display                                                                                                                       ?EKG ? EKG Interpretation ? ?Date/Time:    ?Ventricular Rate:    ?PR Interval:    ?QRS Duration:   ?QT Interval:    ?QTC Calculation:   ?R Axis:     ?Text Interpretation:   ?  ? ?  ? ?Radiology ?No results found. ? ?Pertinent labs & imaging  results that were available during my care of the patient were reviewed by me and considered in my medical decision making (see MDM for details). ? ?Medications Ordered in ED ?Medications - No data to display                                                               ?                                                                    ?Procedures ?Procedures ? ?(including critical care time) ? ?Medical Decision Making / ED Course ? ? ? Complexity of Problem: ? ?Co-morbidities/SDOH that complicate the patient evaluation/care: ?Noted in HPI ? ?Additional history obtained: ?None ? ?Patient's presenting problem/concern and DDX listed below: ?Hallucinations ?Chronic for the patient.  Noncommanding. ?Came in by EMS for secondary gains. ?Denies any SI, HI. ? ? ?  Complexity of Data: ?  ?Cardiac  Monitoring: ?none ? ?Laboratory Tests ordered listed below with my independent interpretation: ?none ?  ?Imaging Studies ordered listed below with my independent interpretation: ?none ?  ?  ?ED Course:   ? ?Hospitalization Considered:  ?no ? ?Assessment, Intervention, and Reassessment: ?Hallucinations ?Chronic and non-commanding. ?Does not appear to be acutely psychotic ?No need for IVC.  ? ?Final Clinical Impression(s) / ED Diagnoses ?Final diagnoses:  ?Hallucinations  ? ? ?  ? ? ? ? ? ?This chart was dictated using voice recognition software.  Despite best efforts to proofread,  errors can occur which can change the documentation meaning. ? ?  ?Nira Conn, MD ?06/28/21 [phone] ? ?

## 2021-07-20 ENCOUNTER — Emergency Department (HOSPITAL_COMMUNITY)
Admission: EM | Admit: 2021-07-20 | Discharge: 2021-07-20 | Disposition: A | Discharge status: Home / Self Care | Payer: Medicaid Other | Attending: Emergency Medicine | Admitting: Emergency Medicine

## 2021-07-20 ENCOUNTER — Encounter (HOSPITAL_COMMUNITY): Payer: Self-pay | Admitting: Emergency Medicine

## 2021-07-20 ENCOUNTER — Other Ambulatory Visit: Payer: Self-pay

## 2021-07-20 DIAGNOSIS — M79641 Pain in right hand: Secondary | ICD-10-CM | POA: Insufficient documentation

## 2021-07-20 DIAGNOSIS — R200 Anesthesia of skin: Secondary | ICD-10-CM | POA: Insufficient documentation

## 2021-07-20 DIAGNOSIS — L539 Erythematous condition, unspecified: Secondary | ICD-10-CM | POA: Insufficient documentation

## 2021-07-20 DIAGNOSIS — R509 Fever, unspecified: Secondary | ICD-10-CM | POA: Insufficient documentation

## 2021-07-20 MED ORDER — NAPROXEN 500 MG PO TABS: 500.0000 mg | ORAL_TABLET | Freq: Two times a day (BID) | ORAL | 0 refills | Status: AC

## 2021-07-20 MED ORDER — DOXYCYCLINE HYCLATE 100 MG PO CAPS: 100.0000 mg | ORAL_CAPSULE | Freq: Two times a day (BID) | ORAL | 0 refills | Status: DC

## 2021-07-20 NOTE — Discharge Instructions (Addendum)
From your history and physical exam, I am not suspicious of an acute fracture to the right hand or wrist.  You may be experiencing some nerve compression after placement of hand cuffs for a short period of time today, but this is likely to be temporary and improve with rest and anti-inflammatories. ? ?A prophylactic antibiotic has been prescribed for you by the name of Doxycycline.  Please take this twice daily for the full 7 days, and make sure to take with food. ? ?An anti-inflammatory has been prescribed for you as well by the name of Naproxen.  You may take one pill every 12 hours for pain management.  Do not take this at the same time as ibuprofen.  This is are in the same family of medications and only one may be taken at a time. ? ?You may continue to manage pain symptoms with OTC tylenol and ibuprofen as needed.   ? ?Also recommended to follow up with a primary care provider in the next 4-5 days for re-evaluation as needed. ? ?Return to the ED for new or worsening symptoms as discussed. ?

## 2021-07-20 NOTE — ED Provider Notes (Signed)
?[facility]-EMERGENCY DEPT ?Provider Note ? ? ?CSN: [id number] ?Arrival date & time: 07/20/21  1704 ? ?  ? ?History ? ?Chief Complaint  ?Patient presents with  ? Hand Pain  ? ? ?Tyson AliasBlake Schatz is a 26 y.o. male with chief complaint of right hand numbness, pain, and subjective fever since today.  Brought in by [employer].  Was arrested today and is here to have his right hand evaluated before being incarcerated.  Denies recent blunt trauma or reduced range of motion.  States the pain began after the handcuffs were placed.  Admits to using IV drugs earlier today and states he is coming down from them.  Denies any recent tattoos.  Pt is poor historian. ? ?The history is provided by the patient and medical records.  ?Hand Pain ? ? ?  ? ?Home Medications ?Prior to Admission medications   ?Medication Sig Start Date End Date Taking? Authorizing Provider  ?doxycycline (VIBRAMYCIN) 100 MG capsule Take 1 capsule (100 mg total) by mouth 2 (two) times daily. One po bid x 7 days 07/20/21  Yes Cecil Cobbsockerham, Calix Heinbaugh M, PA-C  ?naproxen (NAPROSYN) 500 MG tablet Take 1 tablet (500 mg total) by mouth 2 (two) times daily for 7 days. 07/20/21 07/27/21 Yes Cecil Cobbsockerham, Malachi Suderman M, PA-C  ?gabapentin (NEURONTIN) 300 MG capsule Take 1 capsule (300 mg total) by mouth 3 (three) times daily. 05/16/21 06/15/21  Princess BruinsNguyen, Julie, DO  ?metoprolol tartrate (LOPRESSOR) 25 MG tablet Take 1 tablet (25 mg total) by mouth 2 (two) times daily. 05/16/21 06/15/21  Princess BruinsNguyen, Julie, DO  ?OLANZapine (ZYPREXA) 10 MG tablet Take 1 tablet (10 mg total) by mouth at bedtime. 05/16/21 06/15/21  Princess BruinsNguyen, Julie, DO  ?   ? ?Allergies    ?Haldol [haloperidol]   ? ?Review of Systems   ?Review of Systems  ?Musculoskeletal:   ?     Right hand pain  ?Neurological:  Positive for numbness.  ? ?Physical Exam ?Updated Vital Signs ?BP 131/87   Pulse 97   Temp 98.6 ?F (37 ?C) (Oral)   Resp 18   SpO2 100%  ?Physical Exam ?Vitals and nursing note reviewed.  ?Constitutional:   ?    General: He is not in acute distress. ?   Appearance: Normal appearance. He is well-developed. He is not ill-appearing or diaphoretic.  ?HENT:  ?   Head: Normocephalic and atraumatic.  ?Eyes:  ?   Conjunctiva/sclera: Conjunctivae normal.  ?Cardiovascular:  ?   Rate and Rhythm: Normal rate and regular rhythm.  ?   Pulses: Normal pulses.  ?   Heart sounds: Normal heart sounds. No murmur heard. ?Pulmonary:  ?   Effort: Pulmonary effort is normal. No respiratory distress.  ?   Breath sounds: Normal breath sounds.  ?Abdominal:  ?   Palpations: Abdomen is soft.  ?Musculoskeletal:     ?   General: No swelling, tenderness or deformity.  ?   Cervical back: Neck supple. No tenderness.  ?   Comments: MSK exam of elicited tenderness of upper extremities inconsistent ?Full ROM of bilateral shoulders, elbows, wrists, and fingers at MCP/DIP/PIP ?Negative for ecchymosis, abrasions, bony tenderness, laceration, edema, or obvious deformity ?Negative for snuffbox tenderness  ?Skin: ?   General: Skin is warm and dry.  ?   Capillary Refill: Capillary refill takes less than 2 seconds.  ?   Coloration: Skin is not jaundiced or pale.  ?   Findings: Erythema present. No bruising or rash.  ? ?    ?  Comments: Mild erythema and warmth appreciated from wrist to fingers on dorsal aspect of bilateral hands  ?Neurological:  ?   Mental Status: He is alert and oriented to person, place, and time.  ?   Sensory: No sensory deficit.  ?Psychiatric:     ?   Mood and Affect: Mood normal.  ? ? ?ED Results / Procedures / Treatments   ?Labs ?(all labs ordered are listed, but only abnormal results are displayed) ?Labs Reviewed - No data to display ? ?EKG ?None ? ?Radiology ?No results found. ? ?Procedures ?Procedures  ? ? ?Medications Ordered in ED ?Medications - No data to display ? ?ED Course/ Medical Decision Making/ A&P ?  ?                        ?Medical Decision Making ?Amount and/or Complexity of Data Reviewed ?External Data Reviewed: notes. ?Labs:   Decision-making details documented in ED Course. ?Radiology:  Decision-making details documented in ED Course. ?ECG/medicine tests:  Decision-making details documented in ED Course. ? ?Risk ?OTC drugs. ?Prescription drug management. ? ? ?ED Course:  ?Pt presenting today with reported right hand pain and numbness.  Overall pt history is inconsistent and varies.  Pt alert and oriented x3.  Bilateral dorsal hands warm and erythematous on physical exam, but only the right hand has complaint.  Full ROM and without bony tenderness or obvious deformity or poor capillary refill.  Physical exam and history not suspicious for obvious fracture or dislocation.  Not suspicious of wrist sprain.  Therefore, I believe imaging for the hand and wrist may be deferred at this time.  Pain reportedly started after handcuffs were placed.  Pt reports fever began at same time, then changes answer a few times.  Afebrile throughout duration of visit.  Admits to recent IV drug use earlier today.  Pt appears groggy and fatigued.  CTAB and breathing without increased effort.  Denies chest pain or shortness of breath.  No obvious abscess or evidence of infection.  Complains of subjective numbness to right hand/fingers but none appreciated on physical exam.  Then pt denies numbness of right hand/fingers.  States tenderness of right forearm.  No erythema or warmth of forearm and no tenderness elicited on exam.  Then denies tenderness of right forearm.  Again physical exam inconsistent.  No neurodeficits of upper extremities and they appear neurovascularly intact.   ? ?With pt's history of chronic and very recent IVDU and physical exam findings, I find it appropriate to provide prophylactic antibiotic treatment in the unlikely possibility of an early cellulitis.  Physical exam not strongly suggestive of this alone.  I believe they are more suggestive of minor nerve compression that may accompany the use of handcuffs or pressure of the wrists.  Will  treat symptomatically with anti-inflammatories outpatient.  Do not feel a brace is appropriate conservative treatment for his symptoms at this time.  Recommend follow up with primary care for re-evaluation.   ? ?Disposition:  ?Emergency department workup does not suggest an emergent condition requiring admission or immediate intervention beyond  what has been performed at this time.  The patient is safe for discharge and has been instructed to return immediately for worsening symptoms, change in symptoms or any other concerns ? ?I discussed the patient and their case with my attending, Dr. Renaye Rakers, who agreed with the proposed treatment course.  Pt advised to proceed with prophylactic outpatient antibiotics and follow up with primary care within the  next 4-5 days if symptoms persist for re-evaluation.   Conservative therapy recommended and discussed. Patient will be discharged back to police custody & is agreeable with above plan. ? ? ?This chart was dictated using voice recognition software.  Despite best efforts to proofread,  errors can occur which can change the documentation meaning. ? ? ? ? ? ? ? ?Final Clinical Impression(s) / ED Diagnoses ?Final diagnoses:  ?Right hand pain  ? ? ?Rx / DC Orders ?ED Discharge Orders   ? ?      Ordered  ?  doxycycline (VIBRAMYCIN) 100 MG capsule  2 times daily       ? 07/20/21 1821  ?  naproxen (NAPROSYN) 500 MG tablet  2 times daily       ? 07/20/21 1827  ? ?  ?  ? ?  ? ? ?  ?Cecil Cobbs, PA-C ?07/20/21 1912 ? ?  ?Terald Sleeper, MD ?07/20/21 1919 ? ?

## 2021-07-20 NOTE — ED Triage Notes (Addendum)
Pt BIB [facility].  Pt is under arrest but the jail will not accept him until his right hand is evaluated.  ? ?

## 2021-08-16 ENCOUNTER — Other Ambulatory Visit: Payer: Self-pay

## 2021-08-16 ENCOUNTER — Ambulatory Visit (HOSPITAL_COMMUNITY)
Admission: RE | Admit: 2021-08-16 | Payer: Federal, State, Local not specified - Other | Source: Home / Self Care | Admitting: Psychiatry

## 2021-08-16 ENCOUNTER — Encounter (HOSPITAL_COMMUNITY): Payer: Self-pay | Admitting: Emergency Medicine

## 2021-08-16 ENCOUNTER — Emergency Department (HOSPITAL_COMMUNITY)
Admission: EM | Admit: 2021-08-16 | Discharge: 2021-08-18 | Disposition: A | Discharge status: Home / Self Care | Payer: Self-pay | Attending: Emergency Medicine | Admitting: Emergency Medicine

## 2021-08-16 DIAGNOSIS — F17200 Nicotine dependence, unspecified, uncomplicated: Secondary | ICD-10-CM | POA: Insufficient documentation

## 2021-08-16 DIAGNOSIS — Z760 Encounter for issue of repeat prescription: Secondary | ICD-10-CM | POA: Insufficient documentation

## 2021-08-16 DIAGNOSIS — F1990 Other psychoactive substance use, unspecified, uncomplicated: Secondary | ICD-10-CM

## 2021-08-16 DIAGNOSIS — F22 Delusional disorders: Secondary | ICD-10-CM | POA: Insufficient documentation

## 2021-08-16 DIAGNOSIS — R000 Tachycardia, unspecified: Secondary | ICD-10-CM | POA: Insufficient documentation

## 2021-08-16 DIAGNOSIS — R45851 Suicidal ideations: Secondary | ICD-10-CM | POA: Insufficient documentation

## 2021-08-16 DIAGNOSIS — F1510 Other stimulant abuse, uncomplicated: Secondary | ICD-10-CM

## 2021-08-16 DIAGNOSIS — F1914 Other psychoactive substance abuse with psychoactive substance-induced mood disorder: Secondary | ICD-10-CM | POA: Insufficient documentation

## 2021-08-16 DIAGNOSIS — I10 Essential (primary) hypertension: Secondary | ICD-10-CM | POA: Insufficient documentation

## 2021-08-16 DIAGNOSIS — F32A Depression, unspecified: Secondary | ICD-10-CM | POA: Insufficient documentation

## 2021-08-16 DIAGNOSIS — Z59819 Housing instability, housed unspecified: Secondary | ICD-10-CM

## 2021-08-16 DIAGNOSIS — F1994 Other psychoactive substance use, unspecified with psychoactive substance-induced mood disorder: Secondary | ICD-10-CM

## 2021-08-16 DIAGNOSIS — K0889 Other specified disorders of teeth and supporting structures: Secondary | ICD-10-CM | POA: Insufficient documentation

## 2021-08-16 DIAGNOSIS — Z79899 Other long term (current) drug therapy: Secondary | ICD-10-CM | POA: Insufficient documentation

## 2021-08-16 DIAGNOSIS — R443 Hallucinations, unspecified: Secondary | ICD-10-CM | POA: Insufficient documentation

## 2021-08-16 DIAGNOSIS — F1410 Cocaine abuse, uncomplicated: Secondary | ICD-10-CM

## 2021-08-16 DIAGNOSIS — F1910 Other psychoactive substance abuse, uncomplicated: Secondary | ICD-10-CM | POA: Diagnosis present

## 2021-08-16 LAB — COMPREHENSIVE METABOLIC PANEL
ALT: 18 U/L (ref 0–44)
AST: 30 U/L (ref 15–41)
Albumin: 3.8 g/dL (ref 3.5–5.0)
Alkaline Phosphatase: 78 U/L (ref 38–126)
Anion gap: 9 (ref 5–15)
BUN: 14 mg/dL (ref 6–20)
CO2: 27 mmol/L (ref 22–32)
Calcium: 9.6 mg/dL (ref 8.9–10.3)
Chloride: 101 mmol/L (ref 98–111)
Creatinine, Ser: 0.84 mg/dL (ref 0.61–1.24)
GFR, Estimated: >60 mL/min (ref >60)
Glucose, Bld: 109 mg/dL — ABNORMAL HIGH (ref 70–99)
Potassium: 4.3 mmol/L (ref 3.5–5.1)
Sodium: 137 mmol/L (ref 135–145)
Total Bilirubin: 0.8 mg/dL (ref 0.3–1.2)
Total Protein: 7.5 g/dL (ref 6.5–8.1)

## 2021-08-16 LAB — RAPID URINE DRUG SCREEN, HOSP PERFORMED
Amphetamines: POSITIVE — AB
Barbiturates: NOT DETECTED
Benzodiazepines: NOT DETECTED
Cocaine: POSITIVE — AB
Opiates: NOT DETECTED
Tetrahydrocannabinol: POSITIVE — AB

## 2021-08-16 LAB — CBC
HCT: 40 % (ref 39.0–52.0)
Hemoglobin: 12.9 g/dL — ABNORMAL LOW (ref 13.0–17.0)
MCH: 28.3 pg (ref 26.0–34.0)
MCHC: 32.3 g/dL (ref 30.0–36.0)
MCV: 87.7 fL (ref 80.0–100.0)
Platelets: 318 10*3/uL (ref 150–400)
RBC: 4.56 MIL/uL (ref 4.22–5.81)
RDW: 13.2 % (ref 11.5–15.5)
WBC: 6.1 10*3/uL (ref 4.0–10.5)
nRBC: 0 % (ref 0.0–0.2)

## 2021-08-16 LAB — SALICYLATE LEVEL: Salicylate Lvl: <7 mg/dL — ABNORMAL LOW (ref 7.0–30.0)

## 2021-08-16 LAB — ACETAMINOPHEN LEVEL: Acetaminophen (Tylenol), Serum: <10 ug/mL — ABNORMAL LOW (ref 10–30)

## 2021-08-16 LAB — ETHANOL: Alcohol, Ethyl (B): <10 mg/dL (ref <10)

## 2021-08-16 NOTE — H&P (Signed)
Behavioral Health Medical Screening Exam ? ?Cody Poole is a 26 y.o. male presents to [facility] requesting detox from heroin.  States he needs to be restarted on Suboxone. States he last used heroin 2 days prior.  Reports suicidal and homicidal ideations.  He denied intent or plan.  Ada states " only if I can get my suboxon, will I hurt my self."  Unintelligible mumbling noted. ? ?Chart review patient is well-known to this service, multiple assessments for suicidal ideations and hallucinations.Patient has a charted history with schizophrenia, PTSD, major depressive disorder, methamphetamine abuse/use.  Patient is unable to articulate which medication he is prescribed for mental health.  Patient was transported to [facility] for medical clearance 109/51 HR 138, RR 18. Patient to keep all outpatient follow-up. ? ?Total Time spent with patient: 15 minutes ? ?Psychiatric Specialty Exam: ? ?Presentation  ?General Appearance: Appropriate for Environment; Casual; Fairly Groomed ? ?Eye Contact:Good ? ?Speech:Normal Rate ? ?Speech Volume:Normal ? ?Handedness:Right ? ? ?Mood and Affect  ?Mood:Euthymic ? ?Affect:Appropriate; Full Range; Congruent ? ? ?Thought Process  ?Thought Processes:Linear ? ?Descriptions of Associations:Intact ? ?Orientation:Full (Time, Place and Person) ? ?Thought Content:Rumination; Perseveration (Denied ideas of reference, first rank symptoms) ? ?History of Schizophrenia/Schizoaffective disorder:Yes ? ?Duration of Psychotic Symptoms:Greater than six months ? ?Hallucinations:No data recorded ?Ideas of Reference:None ? ?Suicidal Thoughts:No data recorded ?Homicidal Thoughts:No data recorded ? ?Sensorium  ?Memory:Immediate Good; Recent Good; Remote Good ? ?Judgment:Good ? ?Insight:Good ? ? ?Executive Functions  ?Concentration:Good ? ?Attention Span:Good ? ?Recall:Good ? ?Fund of [place] ? ?Language:Good ? ? ?Psychomotor Activity  ?Psychomotor Activity:No data  recorded ? ?Assets  ?Assets:Desire for Improvement; Housing; Intimacy; Leisure Time ? ? ?Sleep  ?Sleep:No data recorded ? ? ?Physical Exam: ?Physical Exam ?Vitals and nursing note reviewed.  ?Cardiovascular:  ?   Rate and Rhythm: Normal rate and regular rhythm.  ?Neurological:  ?   Mental Status: He is alert.  ?Psychiatric:     ?   Mood and Affect: Mood normal.     ?   Behavior: Behavior normal.  ? ?Review of Systems  ?Psychiatric/Behavioral:  Positive for hallucinations, substance abuse and suicidal ideas. The patient is nervous/anxious.   ?All other systems reviewed and are negative. ?There were no vitals taken for this visit. There is no height or weight on file to calculate BMI. ? ?Musculoskeletal: ?Strength & Muscle Tone: within normal limits ?Gait & Station: normal ?Patient leans: N/A ? ? ?Recommendations: ?Patient sent for medical clearance  ?Patient to follow-up with outpatient resources for substance use/abuse ?Based on my evaluation the patient does not appear to have an emergency medical condition. ? ?Derrill Center, NP ?08/16/2021, 3:20 PM ? ?

## 2021-08-16 NOTE — BH Assessment (Signed)
Clinician messaged Mikayla D. Hervey Ard, RN: "Hey. It's Trey with [facility]. Is the pt able to engage in the assessment, if so the pt will need to be placed in a private room. Also is the pt under IVC?" ? ? ?Clinician awaiting response.  ? ? ?Vertell Novak, MS, [facility] ?Triage Specialist ?[phone] ? ?

## 2021-08-16 NOTE — ED Triage Notes (Signed)
Pt states his suboxone was stolen 1 1/2 weeks ago and he needs all medications refilled including Haldol.  Denies SI/HI but is paranoid and states he feels that people are out to get him. ?

## 2021-08-16 NOTE — ED Notes (Signed)
TTS in process 

## 2021-08-16 NOTE — ED Notes (Signed)
Report given to Monique, RN
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
## 2021-08-17 MED ORDER — BUPRENORPHINE HCL-NALOXONE HCL 8-2 MG SL SUBL
2.0000 | SUBLINGUAL_TABLET | Freq: Every day | SUBLINGUAL | Status: DC
  Administered 2021-08-17 – 2021-08-18 (×2): 2 via SUBLINGUAL
  Filled 2021-08-17 (×2): qty 1
  Filled 2021-08-17: qty 2

## 2021-08-17 MED ORDER — SERTRALINE HCL 50 MG PO TABS
50.0000 mg | ORAL_TABLET | Freq: Every day | ORAL | Status: DC
  Administered 2021-08-17 – 2021-08-18 (×2): 50 mg via ORAL
  Filled 2021-08-17 (×2): qty 1

## 2021-08-17 MED ORDER — CLINDAMYCIN HCL 300 MG PO CAPS
300.0000 mg | ORAL_CAPSULE | Freq: Three times a day (TID) | ORAL | Status: DC
  Administered 2021-08-17 – 2021-08-18 (×2): 300 mg via ORAL
  Filled 2021-08-17 (×5): qty 1

## 2021-08-17 MED ORDER — NICOTINE 21 MG/24HR TD PT24
21.0000 mg | MEDICATED_PATCH | Freq: Every day | TRANSDERMAL | Status: DC
  Administered 2021-08-17 – 2021-08-18 (×2): 21 mg via TRANSDERMAL
  Filled 2021-08-17 (×2): qty 1

## 2021-08-17 MED ORDER — OLANZAPINE 5 MG PO TABS
10.0000 mg | ORAL_TABLET | Freq: Every day | ORAL | Status: DC
  Administered 2021-08-17: 10 mg via ORAL
  Filled 2021-08-17: qty 2

## 2021-08-17 NOTE — ED Notes (Signed)
ED Provider at bedside. 

## 2021-08-17 NOTE — ED Notes (Signed)
Patient on TTS 

## 2021-08-17 NOTE — Discharge Instructions (Addendum)
It was our pleasure to provide your ER care today - we hope that you feel better. ? ?Avoid cocaine and meth use as it is harmful to your physical health and mental well-being - see resources provided in terms of accessing substance use programs.  See additional resources provided as relates shelters, behavioral health counseling, and other social services.  ? ?Follow up with primary care doctor and behavioral health in the next 1-2 weeks.  ? ?For mental health issues and/or crisis, you may also go directly to the [facility] - it is open 24/7 and walk-ins are welcome. ? ?Return to ER if worse, new symptoms, fevers, trouble breathing, or other concern.  ? ?To help you maintain a sober lifestyle, a substance use disorder treatment program may be beneficial to you.  Contact one of the following providers at your earliest opportunity to ask about enrolling in their program:  ? ?MEDICAL DETOX/RESIDENTIAL TREATMENT:  ?  ?     [facility]  ?     [id number]  ?     [place], [place] [address]  ?     [phone]  ?  ?     [facility] ?     [address]     [place], [place] [address]  ?     [phone]  ? ?LONG TERM RESIDENTIAL REHAB PROGRAMS - CHARITABLE:  ? ?     [facility]  ?     [address]  ?     [place], [place] [address]  ?     [phone]  ?  ?     [facility]  ?     [address]  ?     [place], [place] [address]  ?     [phone]  ?  ?     [place] [id number]  ?     [place], [place] [address]  ?     [phone]  ?  ?     [facility]  ?     [address]     [place], [place] [address]  ?     [phone]  ?  ?     [facility] ?     [place], [place]  ?     [place], [place] [address]  ?     [phone]  ? ?[facility] will also offer the following outpatient services: (Monday through Friday 8am-5pm) ?  ?Partial Hospitalization Program (PHP) ?Substance Abuse Intensive Outpatient Program (SA-IOP) ?Group  Therapy ?Medication Management ?Peer Living Room ?We also provide (24/7):  ?Assessments: Our mental health clinician and providers will conduct a focused mental health evaluation, assessing for immediate safety concerns and further mental health needs. ?Referral: Our team will provide resources and help connect to community based mental health treatment, when indicated, including psychotherapy, psychiatry, and other specialized behavioral health or substance use disorder services (for those not already in treatment). ?Transitional Care: Our team providers in person bridging and/or telephonic follow-up during the patient's transition to outpatient services.  ? ?[facility] ?24-Hour Call Center: ?[phone] ?Behavioral Health Crisis Line: ?[phone] ? ?

## 2021-08-17 NOTE — ED Notes (Signed)
Patient out using phone ?
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
## 2021-08-17 NOTE — ED Notes (Signed)
Breakfast order placed ?

## 2021-08-17 NOTE — ED Notes (Addendum)
error 

## 2021-08-17 NOTE — Progress Notes (Signed)
CSW provided the following resources to utilize to obtain further treatment to address needs listed below in the patient AVS. ? ?Substance Abuse Resources ? ? ?[facility] ?- Admissions are currently completed Monday through Friday at [facility]; both appointments and walk-ins are accepted.  Any individual that is a [facility] resident may present for a substance abuse screening and assessment for admission.  A person may be referred by numerous sources or self-refer.   Potential clients will be screened for medical necessity and appropriateness for the program.  Clients must meet criteria for high-intensity residential treatment services.  If clinically appropriate, a client will continue with the comprehensive clinical assessment and intake process, as well as enrollment in the [facility]. ? ?Address: [address], [place] [address] ?Admin Hours: Mon-Fri 8AM to [place] Hours: 24/7 ?Phone: [phone] ?Fax: [phone] ? ?[facility] (Detox) Facility Based Crisis:  ?These are 3 locations for services: Please call before arrival:   ? ?[facility])  ?Address: [address] [place], [place] [address] ?Phone: [phone] ? ?[facility]) ?Address: [address] [place], [place] [address] ?Phone#: [phone] ? ?[facility]) ?Address: [address] [place], [place], [place] [address] ?Phone#: [phone] ? ? ?[facility] ([facility]): (offers outpatient therapy and intensive outpatient substance abuse therapy).  ?[address], [place], [place] [address] ?Phone: [phone] ? ?[facility]: ? ? Phone: [phone] ? ?[facility] ?SA Intensive Outpatient Program (SAIOP) means structured individual and group addiction activities and services that are provided at an outpatient program designed to assist adult  and adolescent consumers to begin recovery and learn skills for recovery maintenance. The [facility] program is offered at least 3 hours a day, 3 days a week. SAIOP services shall include a structured program consisting of, but not limited to, the following services: ?Individual counseling and support; Group counseling and support; Family counseling, training or support; Biochemical assays to identify recent drug use (e.g., urine drug screens); Strategies for relapse prevention to include community and social support systems in treatment; Life skills; Crisis contingency planning; Disease Management; and Treatment support activities that have been adapted or specifically designed for persons with physical disabilities, or persons with co-occurring disorders of mental illness and substance abuse/dependence or mental retardation/developmental disability and substance abuse/dependence. ? ?Phone: [phone]  ? ?[facility]) ? ?Address: [place], [place], [place] [address] ?Phone: [phone] ? ? ?[facility] ?Address: [address] [place],  [address] ?Phone: [phone] ? ?- a combination of group and individual sessions to meet the participants needs. This allows participants to engage in treatment and remain involved in their home and work life. ?- Transitional housing places program participants in a supportive living environment while they complete a treatment program and work to secure independent housing. ?- The Substance Abuse Intensive Outpatient Treatment Program at [facility] consists of structured group sessions and individual sessions that are designed to teach participants early recovery and relapse prevention skills. ?-[facility] works with the [employer] to provide a housing and treatment program for homeless veterans.  ? ?[facility].  ? ?Address: [address] [place],   [address] ?Phone#: [phone]  ? ?: Referrals to RTSA facilities can be made by [facility] and [facility].  Referrals are also accepted from physicians,  private providers, hospital emergency rooms, family members, or any person who has knowledge of someone in the need of our services. ? ?[facility] will also offer the following outpatient services: (Monday through Friday 8am-5pm) ?  ?Partial Hospitalization Program (PHP) ?Substance Abuse Intensive Outpatient Program (SA-IOP) ?Group Therapy ?Medication Management ?Peer Living Room ?We also provide (24/7):  ?Assessments: Our mental health clinician and providers will conduct a focused mental health evaluation, assessing for immediate safety concerns and further mental health needs. ?Referral: Our team will provide resources and help connect to community based mental health treatment, when indicated, including psychotherapy, psychiatry, and other specialized behavioral health or substance use disorder services (for those not already in treatment). ?Transitional Care: Our team providers in person bridging and/or telephonic follow-up during the patient's transition to outpatient services.  ? ?[facility] ?24-Hour Call Center: ?[phone] ?Behavioral Health Crisis Line: ?[phone] ? ?[place], MSW, LCSW-A, LCAS ?Phone: [phone] ?Disposition/TOC ?  ?

## 2021-08-17 NOTE — ED Notes (Signed)
Patient given supplies to shower.  ?

## 2021-08-18 ENCOUNTER — Encounter (HOSPITAL_COMMUNITY): Payer: Self-pay | Admitting: Registered Nurse

## 2021-08-18 ENCOUNTER — Other Ambulatory Visit: Payer: Self-pay

## 2021-08-18 DIAGNOSIS — F1910 Other psychoactive substance abuse, uncomplicated: Secondary | ICD-10-CM | POA: Diagnosis present

## 2021-08-18 DIAGNOSIS — F1994 Other psychoactive substance use, unspecified with psychoactive substance-induced mood disorder: Secondary | ICD-10-CM

## 2021-08-18 MED ORDER — CLINDAMYCIN HCL 150 MG PO CAPS
450.0000 mg | ORAL_CAPSULE | Freq: Four times a day (QID) | ORAL | 0 refills | Status: AC
  Filled 2021-08-18: qty 36, 3d supply, fill #0
  Filled 2021-08-18: qty 72, 6d supply, fill #0

## 2021-08-18 MED ORDER — ACETAMINOPHEN 500 MG PO TABS
1000.0000 mg | ORAL_TABLET | Freq: Four times a day (QID) | ORAL | Status: DC | PRN
  Administered 2021-08-18: 1000 mg via ORAL
  Filled 2021-08-18: qty 2

## 2021-08-18 NOTE — Consult Note (Signed)
Telepsych Consultation  ? ?Reason for Consult:  Suicidal ideation ?Referring Physician:  Placido Sou, PA-C ?Location of Patient: [facility] ED ?Location of Provider: Other: [facility] ? ?Patient Identification: Cody Poole ?MRN:  [id number] ?Principal Diagnosis: Substance induced mood disorder (HCC) ?Diagnosis:  Principal Problem: ?  Substance induced mood disorder (HCC) ?Active Problems: ?  Polysubstance abuse (HCC) ? ? ?Total Time spent with patient: 45 minutes ? ?Subjective:   ?Cody Poole is a 26 y.o. male patient admitted to [facility] ED after being sent from [name] where patient initially presented requesting rehab and detox services.  Patient sent to ED for medical clearance related to tachycardia ?Patient has been seen multiple times in urgent care and ED. ? ?HPI:  Cody Poole, 26 y.o., male patient seen via tele health by this provider, consulted with Dr. Nelly Rout; and chart reviewed on 08/18/21.  On evaluation [name] reports he initially presented for treatment because he needed help related to seeing and hearing things.  Patient reports that he wanted to go to a behavioral health Hospital where he could get some kind of help to get into a rehab facility.  Reports he is interested in long-term rehab.  Patient reports that he does not want to be discharged back to the street because he does not feel safe on the street.  Patient reports he was kicked out of parents home related to substance use and he cannot go back home until he has done rehab.  Patient reports that he is currently being treated at a Suboxone clinic and [place].  At this time patient denies suicidal/self-harm/homicidal ideations, psychosis, paranoia. ? ? ?During evaluation Rangel Echeverri is sitting on side of bed in no acute distress.  He is alert, oriented x 4, calm and cooperative.  His mood is anxious with congruent affect.  He does not appear to be responding to internal/external stimuli or delusional thoughts.  He denies suicidal/self-harm/homicidal ideation, psychosis, and paranoia.  Patient answered question appropriately. ?Discussed rehabilitation possibilities.  Patient is aware that individuals that are suicidal are not accepted to rehab facilities related to staff unable to watch 1:1.  Patient also made aware that most rehab facilities consult completed and patient psychiatrically cleared.  Patient offered facility-based crisis unit but would have to stop Suboxone to go to long term rehab facility.  Patient not interested in stopping Suboxone.  Will have BH coordinator to add resources for short/long term rehab facilities and community services for substance abuse. facilities will not continue to prescribe or give Suboxone.  Patient he doesn't want to come off of Suboxone.  Patient informed will be given resources for rehab facilities.  Patient can follow up at current Suboxone clinic.   ? ? ?Past Psychiatric History: Depression, polysubstance abuse, substance-induced mood disorder, substance-induced psychosis ? ?Risk to Self: Denies ?Risk to Others: Denies ?Prior Inpatient Therapy: Yes ?Prior Outpatient Therapy: Yes ? ?Past Medical History:  ?Past Medical History:  ?Diagnosis Date  ? Anxiety   ? Back pain   ? Depression   ? Hypertension   ? Paranoid schizophrenia (HCC)   ? History reviewed. No pertinent surgical history. ?Family History:  ?Family History  ?Family history unknown: Yes  ? ?Family Psychiatric  History: No reported ?Social History:  ?Social History  ? ?Substance and Sexual Activity  ?Alcohol Use No  ?   ?Social History  ? ?Substance and Sexual Activity  ?Drug Use Yes  ? Types: IV, Cocaine  ? Comment: Last use 05/09/20  ?  ?  Social History  ? ?Socioeconomic History  ? Marital status: Single  ?  Spouse name: Not on file  ? Number of children: Not on file  ? Years of education: Not on file  ? Highest education level: Not on file  ?Occupational History  ? Not on file  ?Tobacco Use  ? Smoking status: Every Day   ? Smokeless tobacco: Never  ?Vaping Use  ? Vaping Use: Never used  ?Substance and Sexual Activity  ? Alcohol use: No  ? Drug use: Yes  ?  Types: IV, Cocaine  ?  Comment: Last use 05/09/20  ? Sexual activity: Not on file  ?  Comment: heroin, crack cocaine  ?Other Topics Concern  ? Not on file  ?Social History Narrative  ? Not on file  ? ?Social Determinants of Health  ? ?Financial Resource Strain: Not on file  ?Food Insecurity: Not on file  ?Transportation Needs: Not on file  ?Physical Activity: Not on file  ?Stress: Not on file  ?Social Connections: Not on file  ? ?Additional Social History: ?  ? ?Allergies:   ?Allergies  ?Allergen Reactions  ? Haldol [Haloperidol] Other (See Comments)  ?  Tongue swelling and dysarthria, relieved with benadryl  ? ? ?Labs:  ?Results for orders placed or performed during the hospital encounter of 08/16/21 (from the past 48 hour(s))  ?Comprehensive metabolic panel     Status: Abnormal  ? Collection Time: 08/16/21  7:30 PM  ?Result Value Ref Range  ? Sodium 137 135 - 145 mmol/L  ? Potassium 4.3 3.5 - 5.1 mmol/L  ? Chloride 101 98 - 111 mmol/L  ? CO2 27 22 - 32 mmol/L  ? Glucose, Bld 109 (H) 70 - 99 mg/dL  ?  Comment: Glucose reference range applies only to samples taken after fasting for at least 8 hours.  ? BUN 14 6 - 20 mg/dL  ? Creatinine, Ser 0.84 0.61 - 1.24 mg/dL  ? Calcium 9.6 8.9 - 10.3 mg/dL  ? Total Protein 7.5 6.5 - 8.1 g/dL  ? Albumin 3.8 3.5 - 5.0 g/dL  ? AST 30 15 - 41 U/L  ? ALT 18 0 - 44 U/L  ? Alkaline Phosphatase 78 38 - 126 U/L  ? Total Bilirubin 0.8 0.3 - 1.2 mg/dL  ? GFR, Estimated >60 >60 mL/min  ?  Comment: (NOTE) ?Calculated using the CKD-EPI Creatinine Equation (2021) ?  ? Anion gap 9 5 - 15  ?  Comment: Performed at [facility], [address] [place], [place] [address]  ?Ethanol     Status: None  ? Collection Time: 08/16/21  7:30 PM  ?Result Value Ref Range  ? Alcohol, Ethyl (B) <10 <10 mg/dL  ?  Comment: (NOTE) ?Lowest detectable limit for serum  alcohol is 10 mg/dL. ? ?For medical purposes only. ?Performed at [facility], [address] [place], [place] ?[address] ?  ?Salicylate level     Status: Abnormal  ? Collection Time: 08/16/21  7:30 PM  ?Result Value Ref Range  ? Salicylate Lvl <7.0 (L) 7.0 - 30.0 mg/dL  ?  Comment: Performed at [facility], [address] [place], [place] [address]  ?Acetaminophen level     Status: Abnormal  ? Collection Time: 08/16/21  7:30 PM  ?Result Value Ref Range  ? Acetaminophen (Tylenol), Serum <10 (L) 10 - 30 ug/mL  ?  Comment: (NOTE) ?Therapeutic concentrations vary significantly. A range of 10-30 ug/mL  ?may be an  effective concentration for many patients. However, some  ?are best treated at concentrations outside of this range. ?Acetaminophen concentrations >150 ug/mL at 4 hours after ingestion  ?and >50 ug/mL at 12 hours after ingestion are often associated with  ?toxic reactions. ? ?Performed at [facility], [address], [place], [place] ?[address] ?  ?cbc     Status: Abnormal  ? Collection Time: 08/16/21  7:30 PM  ?Result Value Ref Range  ? WBC 6.1 4.0 - 10.5 K/uL  ? RBC 4.56 4.22 - 5.81 MIL/uL  ? Hemoglobin 12.9 (L) 13.0 - 17.0 g/dL  ? HCT 40.0 39.0 - 52.0 %  ? MCV 87.7 80.0 - 100.0 fL  ? MCH 28.3 26.0 - 34.0 pg  ? MCHC 32.3 30.0 - 36.0 g/dL  ? RDW 13.2 11.5 - 15.5 %  ? Platelets 318 150 - 400 K/uL  ? nRBC 0.0 0.0 - 0.2 %  ?  Comment: Performed at [facility], [address] [place], [place] [address]  ?Rapid urine drug screen (hospital performed)     Status: Abnormal  ? Collection Time: 08/16/21  9:41 PM  ?Result Value Ref Range  ? Opiates NONE DETECTED NONE DETECTED  ? Cocaine POSITIVE (A) NONE DETECTED  ? Benzodiazepines NONE DETECTED NONE DETECTED  ? Amphetamines POSITIVE (A) NONE DETECTED  ? Tetrahydrocannabinol POSITIVE (A) NONE DETECTED  ? Barbiturates NONE DETECTED NONE DETECTED  ?  Comment: (NOTE) ?DRUG SCREEN FOR MEDICAL PURPOSES ?ONLY.  IF CONFIRMATION IS NEEDED ?FOR ANY  PURPOSE, NOTIFY LAB ?WITHIN 5 DAYS. ? ?LOWEST DETECTABLE LIMITS ?FOR URINE DRUG SCREEN ?Drug Class                     Cutoff (ng/mL) ?Amphetamine and metabolites    1000 ?Barbiturate and metabolites    20

## 2021-08-18 NOTE — ED Notes (Signed)
Person from behavioral health called "has a place for Cody Poole but they will not give suboxone"  Will he go there?   Cody Poole declined.   She will look further ?

## 2021-08-18 NOTE — ED Provider Notes (Signed)
Emergency Medicine Observation Re-evaluation Note ? ?Cody Poole is a 26 y.o. male, seen on rounds today.  Pt initially presented to the ED for complaints of substance use and homelessness, and related stress. Two days ago did have some paranoid thoughts (?related to substance use) - that has resolved. No new c/o this AM. ? ?Physical Exam  ?BP 107/78 (BP Location: Right Arm)   Pulse 79   Temp 97.7 ?F (36.5 ?C) (Oral)   Resp 14   SpO2 97%  ?Physical Exam ?General: alert, content, conversant. ?Cardiac: regular rate. ?Lungs: breathing comfortably. ?Psych: patient exhibits normal mood and affect. He is eating/drinking, normal appetite, is sleeping at night. Pt does not voice any thoughts or plan to harm self or others. Pt does not appear acutely depressed or despondent. Pt is not responding to internal stimuli - no delusions or hallucinations are noted.  ? ?ED Course / MDM  ? ? ?I have reviewed the labs performed to date as well as medications administered while in observation.  Recent changes in the last 24 hours include ED obs, reassessment.  ? ?Plan  ? ? Cody Poole is not under involuntary commitment. ? ?Pt reports feeling improved. Normal mood/affect. No SI/HI. No acute psychosis. Pt does acknowledge stress related to substance use, and housing instability. Currently, his main concern appears to be being provided a temporary place to stay. He does indicate has family in area, and acknowledges prior stresses on those relationships as source of housing as relates substance use and legal issues.  ? ?Will provide pt resource guides as relates shelters, substance use programs, and other behavioral health and social resources.  ? ?Pt currently appears stable for d/c.  ? ? ?  ?Cody Laine, MD ?08/18/21 1105 ? ?

## 2021-08-18 NOTE — ED Provider Notes (Signed)
Emergency Medicine Observation Re-evaluation Note ? ?Cody Poole is a 26 y.o. male, seen on rounds today.  Pt initially presented to the ED for complaints of Medication Refill and Paranoid ?Currently, the patient is asleep. ? ?Physical Exam  ?BP 107/78 (BP Location: Right Arm)   Pulse 79   Temp 97.7 ?F (36.5 ?C) (Oral)   Resp 14   SpO2 97%  ?Physical Exam ?General: NAD ?Cardiac: well perfused ?Lungs: even and unlabored ?Psych: no agitation ? ?ED Course / MDM  ?EKG:EKG Interpretation ? ?Date/Time:  Sunday Aug 17 2021 11:08:48 EDT ?Ventricular Rate:  81 ?PR Interval:  158 ?QRS Duration: 98 ?QT Interval:  356 ?QTC Calculation: 413 ?R Axis:   84 ?Text Interpretation: Sinus rhythm with Premature atrial complexes Otherwise normal ECG When compared with ECG of 16-Aug-2021 21:42, HEART RATE has decreased QT has shortened Confirmed by Dione Booze ([id number]) on 08/18/2021 3:43:03 AM ? ?I have reviewed the labs performed to date as well as medications administered while in observation.  Recent changes in the last 24 hours include pt evaluated by psychiatry and recommendations included observation and reassessment. ? ?Plan  ?Current plan is for reassessment by psychiatry, disposition pending. ? Doyle Kunath is not under involuntary commitment. ? ? ?  ?Ernie Avena, MD ?08/18/21 [phone] ? ?

## 2021-08-18 NOTE — ED Notes (Signed)
Breakfast order placed ?

## 2021-08-18 NOTE — BH Assessment (Signed)
[facility] Assessment Progress Note ?  ?Per Shuvon Rankin, NP, this pt does not require psychiatric hospitalization at this time.  Pt is psychiatrically cleared.  Discharge instructions include referral information for mental health treatment providers, and for area substance use disorder treatment providers.  EDP [name], MD and pt's nurse, Merton Border, have been notified. ? ?Doylene Canning, MA ?Triage Specialist ?[phone] ? ?

## 2021-08-19 ENCOUNTER — Other Ambulatory Visit: Payer: Self-pay

## 2021-08-25 ENCOUNTER — Other Ambulatory Visit: Payer: Self-pay

## 2021-08-26 ENCOUNTER — Other Ambulatory Visit: Payer: Self-pay

## 2021-09-12 ENCOUNTER — Emergency Department (HOSPITAL_COMMUNITY)
Admission: EM | Admit: 2021-09-12 | Discharge: 2021-09-12 | Disposition: A | Discharge status: Rehabilitation Facility | Payer: 59 | Attending: Emergency Medicine | Admitting: Emergency Medicine

## 2021-09-12 ENCOUNTER — Encounter (HOSPITAL_COMMUNITY): Payer: Self-pay | Admitting: Emergency Medicine

## 2021-09-12 ENCOUNTER — Other Ambulatory Visit: Payer: Self-pay

## 2021-09-12 ENCOUNTER — Ambulatory Visit (HOSPITAL_COMMUNITY)
Admission: EM | Admit: 2021-09-12 | Discharge: 2021-09-12 | Disposition: A | Discharge status: Home / Self Care | Payer: No Payment, Other | Attending: Behavioral Health | Admitting: Behavioral Health

## 2021-09-12 DIAGNOSIS — R45851 Suicidal ideations: Secondary | ICD-10-CM | POA: Diagnosis not present

## 2021-09-12 DIAGNOSIS — Z046 Encounter for general psychiatric examination, requested by authority: Secondary | ICD-10-CM | POA: Diagnosis not present

## 2021-09-12 DIAGNOSIS — Y900 Blood alcohol level of less than 20 mg/100 ml: Secondary | ICD-10-CM | POA: Diagnosis not present

## 2021-09-12 DIAGNOSIS — F1193 Opioid use, unspecified with withdrawal: Secondary | ICD-10-CM | POA: Insufficient documentation

## 2021-09-12 DIAGNOSIS — Z20822 Contact with and (suspected) exposure to covid-19: Secondary | ICD-10-CM | POA: Diagnosis not present

## 2021-09-12 LAB — RAPID URINE DRUG SCREEN, HOSP PERFORMED
Amphetamines: NOT DETECTED
Barbiturates: NOT DETECTED
Benzodiazepines: NOT DETECTED
Cocaine: POSITIVE — AB
Opiates: NOT DETECTED
Tetrahydrocannabinol: POSITIVE — AB

## 2021-09-12 LAB — CBC
HCT: 39.5 % (ref 39.0–52.0)
Hemoglobin: 12.9 g/dL — ABNORMAL LOW (ref 13.0–17.0)
MCH: 28.3 pg (ref 26.0–34.0)
MCHC: 32.7 g/dL (ref 30.0–36.0)
MCV: 86.6 fL (ref 80.0–100.0)
Platelets: 344 10*3/uL (ref 150–400)
RBC: 4.56 MIL/uL (ref 4.22–5.81)
RDW: 13.3 % (ref 11.5–15.5)
WBC: 4.7 10*3/uL (ref 4.0–10.5)
nRBC: 0 % (ref 0.0–0.2)

## 2021-09-12 LAB — RESP PANEL BY RT-PCR (FLU A&B, COVID) ARPGX2
Influenza A by PCR: NEGATIVE
Influenza B by PCR: NEGATIVE
SARS Coronavirus 2 by RT PCR: NEGATIVE

## 2021-09-12 LAB — COMPREHENSIVE METABOLIC PANEL
ALT: 19 U/L (ref 0–44)
AST: 23 U/L (ref 15–41)
Albumin: 3.7 g/dL (ref 3.5–5.0)
Alkaline Phosphatase: 78 U/L (ref 38–126)
Anion gap: 9 (ref 5–15)
BUN: 10 mg/dL (ref 6–20)
CO2: 26 mmol/L (ref 22–32)
Calcium: 9.7 mg/dL (ref 8.9–10.3)
Chloride: 104 mmol/L (ref 98–111)
Creatinine, Ser: 0.73 mg/dL (ref 0.61–1.24)
GFR, Estimated: >60 mL/min (ref >60)
Glucose, Bld: 114 mg/dL — ABNORMAL HIGH (ref 70–99)
Potassium: 4 mmol/L (ref 3.5–5.1)
Sodium: 139 mmol/L (ref 135–145)
Total Bilirubin: 0.3 mg/dL (ref 0.3–1.2)
Total Protein: 7.3 g/dL (ref 6.5–8.1)

## 2021-09-12 LAB — ETHANOL: Alcohol, Ethyl (B): <10 mg/dL (ref <10)

## 2021-09-12 LAB — ACETAMINOPHEN LEVEL: Acetaminophen (Tylenol), Serum: <10 ug/mL — ABNORMAL LOW (ref 10–30)

## 2021-09-12 LAB — SALICYLATE LEVEL: Salicylate Lvl: <7 mg/dL — ABNORMAL LOW (ref 7.0–30.0)

## 2021-09-12 MED ORDER — CLONIDINE HCL 0.1 MG PO TABS
0.1000 mg | ORAL_TABLET | Freq: Four times a day (QID) | ORAL | Status: DC
  Administered 2021-09-12: 0.1 mg via ORAL
  Filled 2021-09-12: qty 1

## 2021-09-12 MED ORDER — METHOCARBAMOL 500 MG PO TABS
500.0000 mg | ORAL_TABLET | Freq: Three times a day (TID) | ORAL | Status: DC | PRN
  Administered 2021-09-12: 500 mg via ORAL
  Filled 2021-09-12: qty 1

## 2021-09-12 MED ORDER — OLANZAPINE 5 MG PO TABS: 5.0000 mg | ORAL_TABLET | Freq: Two times a day (BID) | ORAL | 0 refills | Status: DC

## 2021-09-12 MED ORDER — MAGNESIUM HYDROXIDE 400 MG/5ML PO SUSP: 30.0000 mL | Freq: Every day | ORAL | Status: DC | PRN

## 2021-09-12 MED ORDER — METHOCARBAMOL 500 MG PO TABS: 500.0000 mg | ORAL_TABLET | Freq: Three times a day (TID) | ORAL | Status: DC | PRN

## 2021-09-12 MED ORDER — GABAPENTIN 100 MG PO CAPS
100.0000 mg | ORAL_CAPSULE | Freq: Three times a day (TID) | ORAL | Status: DC
  Administered 2021-09-12: 100 mg via ORAL
  Filled 2021-09-12: qty 1

## 2021-09-12 MED ORDER — ACETAMINOPHEN 325 MG PO TABS: 650.0000 mg | ORAL_TABLET | Freq: Four times a day (QID) | ORAL | Status: DC | PRN

## 2021-09-12 MED ORDER — ONDANSETRON 4 MG PO TBDP: 4.0000 mg | ORAL_TABLET | Freq: Four times a day (QID) | ORAL | Status: DC | PRN

## 2021-09-12 MED ORDER — CLONIDINE HCL 0.2 MG PO TABS: 0.1000 mg | ORAL_TABLET | Freq: Two times a day (BID) | ORAL | Status: DC

## 2021-09-12 MED ORDER — NAPROXEN 250 MG PO TABS: 500.0000 mg | ORAL_TABLET | Freq: Two times a day (BID) | ORAL | Status: DC | PRN

## 2021-09-12 MED ORDER — DICYCLOMINE HCL 20 MG PO TABS: 20.0000 mg | ORAL_TABLET | Freq: Four times a day (QID) | ORAL | Status: DC | PRN

## 2021-09-12 MED ORDER — CLONIDINE HCL 0.2 MG PO TABS: 0.1000 mg | ORAL_TABLET | Freq: Every day | ORAL | Status: DC

## 2021-09-12 MED ORDER — HYDROXYZINE HCL 25 MG PO TABS: 25.0000 mg | ORAL_TABLET | Freq: Four times a day (QID) | ORAL | Status: DC | PRN

## 2021-09-12 MED ORDER — LOPERAMIDE HCL 2 MG PO CAPS: 2.0000 mg | ORAL_CAPSULE | ORAL | Status: DC | PRN

## 2021-09-12 MED ORDER — CLONIDINE HCL 0.2 MG PO TABS: 0.1000 mg | ORAL_TABLET | Freq: Four times a day (QID) | ORAL | Status: DC

## 2021-09-12 MED ORDER — CLONIDINE HCL 0.1 MG PO TABS: 0.1000 mg | ORAL_TABLET | Freq: Every day | ORAL | Status: DC

## 2021-09-12 MED ORDER — DICYCLOMINE HCL 20 MG PO TABS
20.0000 mg | ORAL_TABLET | Freq: Four times a day (QID) | ORAL | Status: DC | PRN
  Administered 2021-09-12: 20 mg via ORAL
  Filled 2021-09-12: qty 1

## 2021-09-12 MED ORDER — CLONIDINE HCL 0.1 MG PO TABS: 0.1000 mg | ORAL_TABLET | ORAL | Status: DC

## 2021-09-12 MED ORDER — OLANZAPINE 5 MG PO TBDP
ORAL_TABLET | ORAL | Status: AC
  Filled 2021-09-12: qty 1

## 2021-09-12 MED ORDER — TRAZODONE HCL 50 MG PO TABS: 50.0000 mg | ORAL_TABLET | Freq: Every evening | ORAL | Status: DC | PRN

## 2021-09-12 MED ORDER — ALUM & MAG HYDROXIDE-SIMETH 200-200-20 MG/5ML PO SUSP: 30.0000 mL | ORAL | Status: DC | PRN

## 2021-09-12 MED ORDER — NAPROXEN 500 MG PO TABS
500.0000 mg | ORAL_TABLET | Freq: Two times a day (BID) | ORAL | Status: DC | PRN
  Administered 2021-09-12: 500 mg via ORAL
  Filled 2021-09-12: qty 1

## 2021-09-12 MED ORDER — GABAPENTIN 100 MG PO CAPS: 100.0000 mg | ORAL_CAPSULE | Freq: Three times a day (TID) | ORAL | 0 refills | Status: DC

## 2021-09-12 MED ORDER — OLANZAPINE 5 MG PO TABS
5.0000 mg | ORAL_TABLET | Freq: Two times a day (BID) | ORAL | Status: DC
  Administered 2021-09-12: 5 mg via ORAL
  Filled 2021-09-12: qty 1

## 2021-09-12 MED ORDER — HYDROXYZINE HCL 25 MG PO TABS
25.0000 mg | ORAL_TABLET | Freq: Three times a day (TID) | ORAL | Status: DC | PRN
  Administered 2021-09-12: 25 mg via ORAL
  Filled 2021-09-12: qty 1

## 2021-09-12 NOTE — ED Notes (Signed)
Pt's belongings inventoried and placed in locker #5 

## 2021-09-12 NOTE — ED Provider Notes (Signed)
[facility] EMERGENCY DEPARTMENT Provider Note   CSN: [id number] Arrival date & time: 09/12/21  0215     History  Chief Complaint  Patient presents with   Suicidal    Cody Poole is a 26 y.o. male.  The history is provided by the patient and medical records.   26 year old male presenting to the ED for psychiatric evaluation.  He reports feeling suicidal with plan to overdose on heroin.  He states he does not know why he started feeling this way, no acute event has happened.  States he uses heroin regularly, last use was Wednesday evening.  He denies any other significant drug or alcohol abuse.  He states he has gone through detox process before and is concerned he will go into withdrawals.  Denies HI/AVH.  Home Medications Prior to Admission medications   Medication Sig Start Date End Date Taking? Authorizing Provider  buprenorphine-naloxone (SUBOXONE) 8-2 mg SUBL SL tablet Place 2 tablets under the tongue daily. 08/07/21   [provider]  clonazePAM (KLONOPIN) 1 MG tablet Take 1 mg by mouth daily. 08/07/21   [provider]  doxycycline (VIBRAMYCIN) 100 MG capsule Take 1 capsule (100 mg total) by mouth 2 (two) times daily. One po bid x 7 days Patient not taking: Reported on 08/16/2021 07/20/21   Cecil Cobbs, PA-C  gabapentin (NEURONTIN) 300 MG capsule Take 1 capsule (300 mg total) by mouth 3 (three) times daily. 05/16/21 08/16/21  Princess Bruins, DO  metoprolol tartrate (LOPRESSOR) 25 MG tablet Take 1 tablet (25 mg total) by mouth 2 (two) times daily. Patient not taking: Reported on 08/16/2021 05/16/21 08/16/21  Princess Bruins, DO  OLANZapine (ZYPREXA) 10 MG tablet Take 1 tablet (10 mg total) by mouth at bedtime. 05/16/21 08/16/21  Princess Bruins, DO  sertraline (ZOLOFT) 50 MG tablet Take 50 mg by mouth daily. 08/07/21   [provider]      Allergies    Haldol [haloperidol]    Review of Systems   Review of Systems  Psychiatric/Behavioral:   Positive for suicidal ideas.   All other systems reviewed and are negative.  Physical Exam Updated Vital Signs BP 136/87 (BP Location: Right Arm)   Pulse 83   Temp (!) 97.5 F (36.4 C) (Oral)   Resp 18   SpO2 97%   Physical Exam Vitals and nursing note reviewed.  Constitutional:      Appearance: He is well-developed.     Comments: Not diaphoretic, sleeping and awoken for exam  HENT:     Head: Normocephalic and atraumatic.  Eyes:     Conjunctiva/sclera: Conjunctivae normal.     Pupils: Pupils are equal, round, and reactive to light.  Cardiovascular:     Rate and Rhythm: Normal rate and regular rhythm.     Heart sounds: Normal heart sounds.  Pulmonary:     Effort: Pulmonary effort is normal.     Breath sounds: Normal breath sounds.  Abdominal:     General: Bowel sounds are normal.     Palpations: Abdomen is soft.  Musculoskeletal:        General: Normal range of motion.     Cervical back: Normal range of motion.  Skin:    General: Skin is warm and dry.  Neurological:     Mental Status: He is alert and oriented to person, place, and time.    ED Results / Procedures / Treatments   Labs (all labs ordered are listed, but only abnormal results are displayed)  Labs Reviewed  COMPREHENSIVE METABOLIC PANEL - Abnormal; Notable for the following components:      Result Value   Glucose, Bld 114 (*)    All other components within normal limits  SALICYLATE LEVEL - Abnormal; Notable for the following components:   Salicylate Lvl <7.0 (*)    All other components within normal limits  ACETAMINOPHEN LEVEL - Abnormal; Notable for the following components:   Acetaminophen (Tylenol), Serum <10 (*)    All other components within normal limits  CBC - Abnormal; Notable for the following components:   Hemoglobin 12.9 (*)    All other components within normal limits  RAPID URINE DRUG SCREEN, HOSP PERFORMED - Abnormal; Notable for the following components:   Cocaine POSITIVE (*)     Tetrahydrocannabinol POSITIVE (*)    All other components within normal limits  RESP PANEL BY RT-PCR (FLU A&B, COVID) ARPGX2  ETHANOL    EKG None  Radiology No results found.  Procedures Procedures    Medications Ordered in ED Medications  cloNIDine (CATAPRES) tablet 0.1 mg (has no administration in time range)    Followed by  cloNIDine (CATAPRES) tablet 0.1 mg (has no administration in time range)    Followed by  cloNIDine (CATAPRES) tablet 0.1 mg (has no administration in time range)  dicyclomine (BENTYL) tablet 20 mg (has no administration in time range)  hydrOXYzine (ATARAX) tablet 25 mg (has no administration in time range)  loperamide (IMODIUM) capsule 2-4 mg (has no administration in time range)  methocarbamol (ROBAXIN) tablet 500 mg (has no administration in time range)  naproxen (NAPROSYN) tablet 500 mg (has no administration in time range)  ondansetron (ZOFRAN-ODT) disintegrating tablet 4 mg (has no administration in time range)    ED Course/ Medical Decision Making/ A&P                           Medical Decision Making Amount and/or Complexity of Data Reviewed Labs: ordered.  Risk Prescription drug management.   26 y.o. male presenting to the ED with suicidal ideation.  Reports he has plans to overdose.  Uses heroin regularly, last dose was yesterday evening.  Denies any other illicit drug or alcohol abuse.  He is awake, alert, oriented.  He is not diaphoretic, no tremors, no vomiting, no tachycardia.  Clinically does not appear to be in acute withdrawal but reports concerns for same.  Labs from today reviewed, overall reassuring without significant electrolyte derangement.  His UDS is positive for cocaine and THC.  He is medically cleared.  Placed on opiate withdrawal protocol.    TTS has evaluated, recommends transfer to [facility] for ongoing monitoring/assessment.  EMTALA completed.  Final diagnoses:  Suicidal ideation    Rx / DC Orders ED Discharge  Orders     None         Garlon Hatchet, PA-C 09/12/21 0542    Geoffery Lyons, MD 09/12/21 [phone]

## 2021-09-12 NOTE — Discharge Instructions (Signed)

## 2021-09-12 NOTE — ED Notes (Signed)
Cody Poole was given breakfast.

## 2021-09-12 NOTE — ED Provider Notes (Signed)
FBC/OBS ASAP Discharge Summary  Date and Time: 09/12/2021 2:08 PM  Name: Cody Poole  MRN:  [id number]   Discharge Diagnoses:  Final diagnoses:  Opioid use with withdrawal [facility])    Subjective: Per nursing, patient is requesting to be discharged. Patient reevaluated by this provider. Patient states that he is ready to be discharged because he cannot sleep in the open area. He states that he does not have his own room. He states that he wants to go back on to the streets. He states that he is no longer interested in receiving detox treatment or residential substance use treatment. He currently voices withdrawal symptoms of generalized body aches. I discussed with the patient taking over-the-counter Tylenol or Motrin for relief. He does not appear to be in severe withdrawal. Patient advised that when he is interested in receiving substance use treatment he can contact the list of facilities on his discharge summary for bed availability. He denies SI/HI/AVH. There is no objective evidence that the patient is currently responding to internal or external stimuli. Patient advised to take medications as prescribed. Patient denies medication side effects at this time. Patient provided with a prescription for Gabapentin and Zyprexa for 7-days. Patient advised to follow up here at [facility] outpatient for medication management. Patient verbalizes understanding.   Stay Summary: Cody Poole is a 26 year old male patient who presents to the [facility] behavioral health urgent care voluntary after he was transferred from [facility] emergency department for continuous assessment. Patient presented to the [facility] with a chief complaint of suicidal ideations with a plan to overdose on medications or heroin and opiate withdrawal symptoms.   Patient seen and reevaluated by this provider, and chart reviewed. On evaluation, patient is alert and partially oriented to person and place. He states that he does not know the month.  His thought process is relevant and speech is clear and coherent at a decreased tone. His mood is anxious and affect is congruent. He is noted to be fidgety and constantly switching position from lying down to sitting up throughout the assessment. He is somewhat irritable and provides little information.    Patient states that he needs to be put back on his medications for schizophrenia. When asked what medications are he currently prescribed, he states "I do not know."  When asked the last time he has taken his medications, he states "I do not know." When asked who prescribes his medications, he states "I do not know." When asked if he has a psychiatrist, he states "at [facility] in [place]."    Patient tells me that he wants to go to a room and sleep. He states that he is hurting all over his body. He reports experiencing withdrawal symptoms from using heroin 2 days ago. He describes his withdrawal symptoms as chills, sweats, anxiety, fatigue and body aches. He reports using heroin every day, on average a half a gram per day. He denies using other illicit drugs. However his UDS is positive for THC and cocaine. He denies drinking alcohol. He reports that he is interested in long-term residential treatment for substance use.   Patient denies suicidal ideations at this time. He denies homicidal ideations. He denies auditory and visual hallucinations.  There is no objective evidence that the patient is currently responding to internal or external stimuli. Patient reports poor sleep, on average 4 hours. He reports a fair appetite.   Patient states that he is homeless. He denies a past or current medical history.  He denies taking prescribed medications at this time.   Per chart review, previous home medications restarted at a low or divided dose for tolerability.  Olanzapine 5 mg p.o. twice daily  Gabapentin 100 mg p.o. 3 times daily Patient was started on a clonidine taper for opiate withdrawal  symptoms.   Total Time spent with patient: 20 minutes  Past Psychiatric History:  History of opioid use disorder, methamphetamine use disorder and PTSD schizophrenia. Last documented inpatient hospitalization at [facility] 05/11/21. Patient was prescribed at [facility] in January 2023, gabapentin 300 mg p.o. 3 times daily, metoprolol 25 mg p.o. twice daily, and olanzapine 10 mg p.o. nightly.  Past Medical History:  Past Medical History:  Diagnosis Date   Anxiety    Back pain    Depression    Hypertension    Paranoid schizophrenia ([place])    No past surgical history on file. Family History:  Family History  Family history unknown: Yes   Family Psychiatric History: No known history.  Social History:  Social History   Substance and Sexual Activity  Alcohol Use No     Social History   Substance and Sexual Activity  Drug Use Yes   Types: IV, Cocaine   Comment: Last use 05/09/20    Social History   Socioeconomic History   Marital status: Single    Spouse name: Not on file   Number of children: Not on file   Years of education: Not on file   Highest education level: Not on file  Occupational History   Not on file  Tobacco Use   Smoking status: Every Day   Smokeless tobacco: Never  Vaping Use   Vaping Use: Never used  Substance and Sexual Activity   Alcohol use: No   Drug use: Yes    Types: IV, Cocaine    Comment: Last use 05/09/20   Sexual activity: Not on file    Comment: heroin, crack cocaine  Other Topics Concern   Not on file  Social History Narrative   Not on file   Social Determinants of Health   Financial Resource Strain: Not on file  Food Insecurity: Not on file  Transportation Needs: Not on file  Physical Activity: Not on file  Stress: Not on file  Social Connections: Not on file   SDOH:  SDOH Screenings   Alcohol Screen: Low Risk    Last Alcohol Screening Score (AUDIT): 5  Depression (PHQ2-9): Not on file  Financial Resource Strain: Not on file   Food Insecurity: Not on file  Housing: Not on file  Physical Activity: Not on file  Social Connections: Not on file  Stress: Not on file  Tobacco Use: High Risk   Smoking Tobacco Use: Every Day   Smokeless Tobacco Use: Never   Passive Exposure: Not on file  Transportation Needs: Not on file    Tobacco Cessation:  Prescription not provided because: declined  Current Medications:  Current Facility-Administered Medications  Medication Dose Route Frequency Provider Last Rate Last Admin   acetaminophen (TYLENOL) tablet 650 mg  650 mg Oral Q6H PRN Denecia Brunette L, NP       alum & mag hydroxide-simeth (MAALOX/MYLANTA) 200-200-20 MG/5ML suspension 30 mL  30 mL Oral Q4H PRN Lanijah Warzecha L, NP       cloNIDine (CATAPRES) tablet 0.1 mg  0.1 mg Oral QID Terese Heier L, NP   0.1 mg at 09/12/21 1040   Followed by   Derrill Memo ON 09/14/2021] cloNIDine (CATAPRES) tablet  0.1 mg  0.1 mg Oral BH-qamhs Maximus Hoffert L, NP       Followed by   Derrill Memo ON 09/16/2021] cloNIDine (CATAPRES) tablet 0.1 mg  0.1 mg Oral QAC breakfast Kaegan Stigler L, NP       dicyclomine (BENTYL) tablet 20 mg  20 mg Oral Q6H PRN Izetta Sakamoto L, NP   20 mg at 09/12/21 1152   gabapentin (NEURONTIN) capsule 100 mg  100 mg Oral TID Aarik Blank L, NP   100 mg at 09/12/21 1040   hydrOXYzine (ATARAX) tablet 25 mg  25 mg Oral TID PRN Dyshawn Cangelosi L, NP   25 mg at 09/12/21 1153   loperamide (IMODIUM) capsule 2-4 mg  2-4 mg Oral PRN Shyne Lehrke L, NP       magnesium hydroxide (MILK OF MAGNESIA) suspension 30 mL  30 mL Oral Daily PRN Simrin Vegh L, NP       methocarbamol (ROBAXIN) tablet 500 mg  500 mg Oral Q8H PRN Virgin Zellers L, NP       naproxen (NAPROSYN) tablet 500 mg  500 mg Oral BID PRN Orpha Dain L, NP   500 mg at 09/12/21 1152   OLANZapine (ZYPREXA) tablet 5 mg  5 mg Oral BID Libia Fazzini L, NP   5 mg at 09/12/21 1055   ondansetron (ZOFRAN-ODT) disintegrating tablet 4 mg  4 mg Oral Q6H PRN Yulia Ulrich L, NP        traZODone (DESYREL) tablet 50 mg  50 mg Oral QHS PRN Darin Arndt L, NP       Current Outpatient Medications  Medication Sig Dispense Refill   gabapentin (NEURONTIN) 100 MG capsule Take 1 capsule (100 mg total) by mouth 3 (three) times daily. 21 capsule 0   OLANZapine (ZYPREXA) 5 MG tablet Take 1 tablet (5 mg total) by mouth 2 (two) times daily. 14 tablet 0    PTA Medications: (Not in a hospital admission)   Musculoskeletal  Strength & Muscle Tone: within normal limits Gait & Station: normal Patient leans: N/A  Psychiatric Specialty Exam  Presentation  General Appearance: Appropriate for Environment  Eye Contact:Fair  Speech:Clear and Coherent  Speech Volume:Normal  Handedness:Right   Mood and Affect  Mood:Euthymic  Affect:Congruent   Thought Process  Thought Processes:Coherent  Descriptions of Associations:Intact  Orientation:Full (Time, Place and Person)  Thought Content:Logical  Diagnosis of Schizophrenia or Schizoaffective disorder in past: Yes  Duration of Psychotic Symptoms: Greater than six months   Hallucinations:Hallucinations: None  Ideas of Reference:None  Suicidal Thoughts:Suicidal Thoughts: No  Homicidal Thoughts:Homicidal Thoughts: No   Sensorium  Memory:Immediate Fair; Recent Fair; Remote Fair  Judgment:Intact  Insight:Fair   Executive Functions  Concentration:Fair  Attention Span:Fair  [place]   Psychomotor Activity  Psychomotor Activity:Psychomotor Activity: Normal   Assets  Assets:Communication Skills; Desire for Improvement; Intimacy; Physical Health   Sleep  Sleep:Sleep: Fair Number of Hours of Sleep: 4   Nutritional Assessment (For OBS and FBC admissions only) Has the patient had a weight loss or gain of 10 pounds or more in the last 3 months?: No Has the patient had a decrease in food intake/or appetite?: No Does the patient have dental problems?: No Does  the patient have eating habits or behaviors that may be indicators of an eating disorder including binging or inducing vomiting?: No Has the patient recently lost weight without trying?: 0 Has the patient been eating poorly because of a decreased appetite?: 0 Malnutrition Screening  Tool Score: 0    Physical Exam  Physical Exam HENT:     Head: Normocephalic.     Nose: Nose normal.  Eyes:     Conjunctiva/sclera: Conjunctivae normal.  Cardiovascular:     Rate and Rhythm: Normal rate.  Pulmonary:     Effort: Pulmonary effort is normal.  Musculoskeletal:        General: Normal range of motion.     Cervical back: Normal range of motion.  Neurological:     Mental Status: He is alert and oriented to person, place, and time.   Review of Systems  Constitutional: Negative.   HENT: Negative.    Eyes: Negative.   Respiratory: Negative.    Cardiovascular: Negative.   Gastrointestinal: Negative.   Genitourinary: Negative.   Musculoskeletal: Negative.   Skin: Negative.   Neurological: Negative.   Endo/Heme/Allergies: Negative.   Blood pressure (!) 124/92, pulse 86, temperature (!) 97.4 F (36.3 C), temperature source Oral, resp. rate 20, SpO2 99 %. There is no height or weight on file to calculate BMI.  Demographic Factors:  Male, [name], and Unemployed  Loss Factors: Financial problems/change in socioeconomic status  Historical Factors: NA  Risk Reduction Factors:   Sense of responsibility to family and Religious beliefs about death  Continued Clinical Symptoms:  Alcohol/Substance Abuse/Dependencies  Cognitive Features That Contribute To Risk:  None    Suicide Risk:  Minimal: No identifiable suicidal ideation.  Patients presenting with no risk factors but with morbid ruminations; may be classified as minimal risk based on the severity of the depressive symptoms  Plan Of Care/Follow-up recommendations:  Activity:  as tolerated   Follow-up [place].   Specialty: Urgent Care Why: Walk-in hours for open access for psychiatry are Mondays, Wednesdays and Fridays 8 am to 11 pm. Please arrive at 7:30 am. Open access for therapy are Mondays, Tuesdays, and Wednesdays from 8:00 am to 11:00 pm. Please arrive at 7:30 am Contact information: [place] [phone]               Disposition: discharged to self/home per request. Patient does not meet [place] IVC criteria. Patient with low risk for SI. Secondary gain suspected due to homelessness.   Marissa Calamity, NP 09/12/2021, 2:08 PM

## 2021-09-12 NOTE — ED Notes (Signed)
Skin check completed with no concerns, patient admitted onto unit in Adult bed 6. Denies any current thoughts of SI/HI, denies A/V hallucinations. States "I need to lay down." Oriented pt to unit. Will continue to monitor.

## 2021-09-12 NOTE — ED Provider Notes (Signed)
[facility] Continuous Assessment Admission H&P  Date: 09/12/21 Patient Name: Cody Poole MRN: [id number]   Diagnoses:  Final diagnoses:  Opioid use with withdrawal [facility])    HPI: Cody Poole is a 26 year old male patient who presents to the [facility] behavioral health urgent care voluntary after he was transferred from [facility] emergency department for continuous assessment. Patient presented to the [facility] with a chief complaint of suicidal ideations with a plan to overdose on medications or heroin and opiate withdrawal symptoms.  Patient seen and reevaluated by this provider, and chart reviewed. On evaluation, patient is alert and partially oriented to person and place. He states that he does not know the month. His thought process is relevant and speech is clear and coherent at a decreased tone. His mood is anxious and affect is congruent. He is noted to be fidgety and constantly switching position from lying down to sitting up throughout the assessment. He is somewhat irritable and provides little information.   Patient states that he needs to be put back on his medications for schizophrenia. When asked what medications are he currently prescribed, he states "I do not know."  When asked the last time he has taken his medications, he states "I do not know." When asked who prescribes his medications, he states "I do not know." When asked if he has a psychiatrist, he states "at [facility] health care in [place]."   Patient tells me that he wants to go to a room and sleep. He states that he is hurting all over his body. He reports experiencing withdrawal symptoms from using heroin 2 days ago. He describes his withdrawal symptoms as chills, sweats, anxiety, fatigue and body aches. He reports using heroin every day, on average a half a gram per day. He denies using other illicit drugs. However his UDS is positive for THC and cocaine. He denies drinking alcohol. He reports that he is interested  in long-term residential treatment for substance use.  Patient denies suicidal ideations at this time. He denies homicidal ideations. He denies auditory and visual hallucinations.  There is no objective evidence that the patient is currently responding to internal or external stimuli. Patient reports poor sleep, on average 4 hours. He reports a fair appetite.  Patient states that he is homeless. He denies a past or current medical history. He denies taking prescribed medications at this time.    PHQ 2-9:   Flowsheet Row ED from 09/12/2021 in [facility] EMERGENCY DEPARTMENT ED from 08/16/2021 in [facility] EMERGENCY DEPARTMENT ED from 07/20/2021 in [facility]  C-SSRS RISK CATEGORY Moderate Risk No Risk No Risk        Total Time spent with patient: 30 minutes  Musculoskeletal  Strength & Muscle Tone: within normal limits Gait & Station: normal Patient leans: N/A  Psychiatric Specialty Exam  Presentation General Appearance: Disheveled  Eye Contact:Minimal  Speech:Clear and Coherent  Speech Volume:Normal  Handedness:Right   Mood and Affect  Mood:Anxious  Affect:Congruent   Thought Process  Thought Processes:Coherent; Goal Directed  Descriptions of Associations:Intact  Orientation:Full (Time, Place and Person)  Thought Content:Logical  Diagnosis of Schizophrenia or Schizoaffective disorder in past: Yes  Duration of Psychotic Symptoms: Greater than six months  Hallucinations:Hallucinations: None  Ideas of Reference:None  Suicidal Thoughts:Suicidal Thoughts: No  Homicidal Thoughts:Homicidal Thoughts: No   Sensorium  Memory:Immediate Fair; Recent Fair; Remote Fair  Judgment:Intact  Insight:Present   Executive Functions  Concentration:Fair  Attention Span:Fair  Recall:Fair  Fund of Knowledge:Fair  Language:Fair   Psychomotor Activity  Psychomotor Activity:Psychomotor Activity:  Restlessness   Assets  Assets:Communication Skills; Desire for Improvement; Physical Health; Leisure Time   Sleep  Sleep:Sleep: Poor Number of Hours of Sleep: 4   Nutritional Assessment (For OBS and FBC admissions only) Has the patient had a weight loss or gain of 10 pounds or more in the last 3 months?: No Has the patient had a decrease in food intake/or appetite?: No Does the patient have dental problems?: No Does the patient have eating habits or behaviors that may be indicators of an eating disorder including binging or inducing vomiting?: No Has the patient recently lost weight without trying?: 0 Has the patient been eating poorly because of a decreased appetite?: 0 Malnutrition Screening Tool Score: 0    Physical Exam HENT:     Head: Normocephalic.     Nose: Nose normal.  Eyes:     Conjunctiva/sclera: Conjunctivae normal.  Cardiovascular:     Rate and Rhythm: Normal rate.  Pulmonary:     Effort: Pulmonary effort is normal.  Musculoskeletal:        General: Normal range of motion.     Cervical back: Normal range of motion.  Neurological:     Mental Status: He is alert and oriented to person, place, and time.   Review of Systems  Constitutional:  Positive for chills, diaphoresis and malaise/fatigue.  HENT: Negative.    Eyes: Negative.   Respiratory: Negative.    Cardiovascular: Negative.   Gastrointestinal: Negative.   Genitourinary: Negative.   Musculoskeletal:  Positive for myalgias.  Skin: Negative.   Neurological: Negative.   Endo/Heme/Allergies: Negative.    Blood pressure (!) 124/92, pulse 86, temperature (!) 97.4 F (36.3 C), temperature source Oral, resp. rate 20, SpO2 99 %. There is no height or weight on file to calculate BMI.  Past Psychiatric History: History of opioid use disorder, methamphetamine use disorder and PTSD schizophrenia. Last documented inpatient hospitalization at [name] 05/11/21. Patient was prescribed at [facility] in January  2023, gabapentin 300 mg p.o. 3 times daily, metoprolol 25 mg p.o. twice daily, and olanzapine 10 mg p.o. nightly.  Is the patient at risk to self? No  Has the patient been a risk to self in the past 6 months? Yes .    Has the patient been a risk to self within the distant past?  unknown   Is the patient a risk to others? No   Has the patient been a risk to others in the past 6 months? No   Has the patient been a risk to others within the distant past?  unknown  Past Medical History:  Past Medical History:  Diagnosis Date   Anxiety    Back pain    Depression    Hypertension    Paranoid schizophrenia (HCC)    No past surgical history on file.  Family History:  Family History  Family history unknown: Yes    Social History:  Social History   Socioeconomic History   Marital status: Single    Spouse name: Not on file   Number of children: Not on file   Years of education: Not on file   Highest education level: Not on file  Occupational History   Not on file  Tobacco Use   Smoking status: Every Day   Smokeless tobacco: Never  Vaping Use   Vaping Use: Never used  Substance and Sexual Activity   Alcohol use:  No   Drug use: Yes    Types: IV, Cocaine    Comment: Last use 05/09/20   Sexual activity: Not on file    Comment: heroin, crack cocaine  Other Topics Concern   Not on file  Social History Narrative   Not on file   Social Determinants of Health   Financial Resource Strain: Not on file  Food Insecurity: Not on file  Transportation Needs: Not on file  Physical Activity: Not on file  Stress: Not on file  Social Connections: Not on file  Intimate Partner Violence: Not on file    SDOH:  SDOH Screenings   Alcohol Screen: Low Risk    Last Alcohol Screening Score (AUDIT): 5  Depression (PHQ2-9): Not on file  Financial Resource Strain: Not on file  Food Insecurity: Not on file  Housing: Not on file  Physical Activity: Not on file  Social Connections: Not on  file  Stress: Not on file  Tobacco Use: High Risk   Smoking Tobacco Use: Every Day   Smokeless Tobacco Use: Never   Passive Exposure: Not on file  Transportation Needs: Not on file    Last Labs:  Admission on 09/12/2021, Discharged on 09/12/2021  Component Date Value Ref Range Status   Sodium 09/12/2021 139  135 - 145 mmol/L Final   Potassium 09/12/2021 4.0  3.5 - 5.1 mmol/L Final   Chloride 09/12/2021 104  98 - 111 mmol/L Final   CO2 09/12/2021 26  22 - 32 mmol/L Final   Glucose, Bld 09/12/2021 114 (H)  70 - 99 mg/dL Final   Glucose reference range applies only to samples taken after fasting for at least 8 hours.   BUN 09/12/2021 10  6 - 20 mg/dL Final   Creatinine, Ser 09/12/2021 0.73  0.61 - 1.24 mg/dL Final   Calcium 60/73/7106 9.7  8.9 - 10.3 mg/dL Final   Total Protein 26/94/8546 7.3  6.5 - 8.1 g/dL Final   Albumin 27/06/5007 3.7  3.5 - 5.0 g/dL Final   AST 38/18/2993 23  15 - 41 U/L Final   ALT 09/12/2021 19  0 - 44 U/L Final   Alkaline Phosphatase 09/12/2021 78  38 - 126 U/L Final   Total Bilirubin 09/12/2021 0.3  0.3 - 1.2 mg/dL Final   GFR, Estimated 09/12/2021 >60  >60 mL/min Final   Comment: (NOTE) Calculated using the CKD-EPI Creatinine Equation (2021)    Anion gap 09/12/2021 9  5 - 15 Final   Performed at [facility], [address], [place], [place] [address]   Alcohol, Ethyl (B) 09/12/2021 <10  <10 mg/dL Final   Comment: (NOTE) Lowest detectable limit for serum alcohol is 10 mg/dL.  For medical purposes only. Performed at [facility], [address], [place], [place] [address]    Salicylate Lvl 09/12/2021 <7.0 (L)  7.0 - 30.0 mg/dL Final   Performed at [facility], [address], [place], [place] [address]   Acetaminophen (Tylenol), Serum 09/12/2021 <10 (L)  10 - 30 ug/mL Final   Performed at [facility], [address], [place], [place] [address]   WBC 09/12/2021 4.7  4.0 - 10.5 K/uL Final   RBC 09/12/2021 4.56  4.22 - 5.81  MIL/uL Final   Hemoglobin 09/12/2021 12.9 (L)  13.0 - 17.0 g/dL Final   HCT 52/77/8242 39.5  39.0 - 52.0 % Final   MCV 09/12/2021 86.6  80.0 - 100.0 fL Final   MCH  09/12/2021 28.3  26.0 - 34.0 pg Final   MCHC 09/12/2021 32.7  30.0 - 36.0 g/dL Final   RDW 40/98/1191 13.3  11.5 - 15.5 % Final   Platelets 09/12/2021 344  150 - 400 K/uL Final   nRBC 09/12/2021 0.0  0.0 - 0.2 % Final   Performed at [facility], [address] [place], [place] [address]   Opiates 09/12/2021 NONE DETECTED  NONE DETECTED Final   Cocaine 09/12/2021 POSITIVE (A)  NONE DETECTED Final   Benzodiazepines 09/12/2021 NONE DETECTED  NONE DETECTED Final   Amphetamines 09/12/2021 NONE DETECTED  NONE DETECTED Final   Tetrahydrocannabinol 09/12/2021 POSITIVE (A)  NONE DETECTED Final   Barbiturates 09/12/2021 NONE DETECTED  NONE DETECTED Final   Comment: (NOTE) DRUG SCREEN FOR MEDICAL PURPOSES ONLY.  IF CONFIRMATION IS NEEDED FOR ANY PURPOSE, NOTIFY LAB WITHIN 5 DAYS.  LOWEST DETECTABLE LIMITS FOR URINE DRUG SCREEN Drug Class                     Cutoff (ng/mL) Amphetamine and metabolites    1000 Barbiturate and metabolites    200 Benzodiazepine                 200 Tricyclics and metabolites     300 Opiates and metabolites        300 Cocaine and metabolites        300 THC                            50 Performed at [facility], [address], [place], [place] [address]    SARS Coronavirus 2 by RT PCR 09/12/2021 NEGATIVE  NEGATIVE Final   Comment: (NOTE) SARS-CoV-2 target nucleic acids are NOT DETECTED.  The SARS-CoV-2 RNA is generally detectable in upper respiratory specimens during the acute phase of infection. The lowest concentration of SARS-CoV-2 viral copies this assay can detect is 138 copies/mL. A negative result does not preclude SARS-Cov-2 infection and should not be used as the sole basis for treatment or other patient management decisions. A negative result may occur with  improper  specimen collection/handling, submission of specimen other than nasopharyngeal swab, presence of viral mutation(s) within the areas targeted by this assay, and inadequate number of viral copies(<138 copies/mL). A negative result must be combined with clinical observations, patient history, and epidemiological information. The expected result is Negative.  Fact Sheet for Patients:  BloggerCourse.com  Fact Sheet for Healthcare Providers:  SeriousBroker.it  This test is no                          t yet approved or cleared by the [place] FDA and  has been authorized for detection and/or diagnosis of SARS-CoV-2 by FDA under an Emergency Use Authorization (EUA). This EUA will remain  in effect (meaning this test can be used) for the duration of the COVID-19 declaration under Section 564(b)(1) of the Act, 21 U.S.C.section 360bbb-3(b)(1), unless the authorization is terminated  or revoked sooner.       Influenza A by PCR 09/12/2021 NEGATIVE  NEGATIVE Final   Influenza B by PCR 09/12/2021 NEGATIVE  NEGATIVE Final   Comment: (NOTE) The Xpert Xpress SARS-CoV-2/FLU/RSV plus assay is intended as an aid in the diagnosis of influenza from Nasopharyngeal swab specimens and should not be used as a sole basis for treatment. Nasal washings and aspirates are unacceptable  for Xpert Xpress SARS-CoV-2/FLU/RSV testing.  Fact Sheet for Patients: BloggerCourse.com  Fact Sheet for Healthcare Providers: SeriousBroker.it  This test is not yet approved or cleared by the [place] FDA and has been authorized for detection and/or diagnosis of SARS-CoV-2 by FDA under an Emergency Use Authorization (EUA). This EUA will remain in effect (meaning this test can be used) for the duration of the COVID-19 declaration under Section 564(b)(1) of the Act, 21 U.S.C. section 360bbb-3(b)(1), unless the  authorization is terminated or revoked.  Performed at [facility], [address] [place], [place] [address]   Admission on 08/16/2021, Discharged on 08/18/2021  Component Date Value Ref Range Status   Sodium 08/16/2021 137  135 - 145 mmol/L Final   Potassium 08/16/2021 4.3  3.5 - 5.1 mmol/L Final   Chloride 08/16/2021 101  98 - 111 mmol/L Final   CO2 08/16/2021 27  22 - 32 mmol/L Final   Glucose, Bld 08/16/2021 109 (H)  70 - 99 mg/dL Final   Glucose reference range applies only to samples taken after fasting for at least 8 hours.   BUN 08/16/2021 14  6 - 20 mg/dL Final   Creatinine, Ser 08/16/2021 0.84  0.61 - 1.24 mg/dL Final   Calcium 60/45/4098 9.6  8.9 - 10.3 mg/dL Final   Total Protein 11/91/4782 7.5  6.5 - 8.1 g/dL Final   Albumin 95/62/1308 3.8  3.5 - 5.0 g/dL Final   AST 65/78/4696 30  15 - 41 U/L Final   ALT 08/16/2021 18  0 - 44 U/L Final   Alkaline Phosphatase 08/16/2021 78  38 - 126 U/L Final   Total Bilirubin 08/16/2021 0.8  0.3 - 1.2 mg/dL Final   GFR, Estimated 08/16/2021 >60  >60 mL/min Final   Comment: (NOTE) Calculated using the CKD-EPI Creatinine Equation (2021)    Anion gap 08/16/2021 9  5 - 15 Final   Performed at [facility], [address], [place], [place] [address]   Alcohol, Ethyl (B) 08/16/2021 <10  <10 mg/dL Final   Comment: (NOTE) Lowest detectable limit for serum alcohol is 10 mg/dL.  For medical purposes only. Performed at [facility], [address] [place], [place] [address]    Salicylate Lvl 08/16/2021 <7.0 (L)  7.0 - 30.0 mg/dL Final   Performed at [facility], [address], [place], [place] [address]   Acetaminophen (Tylenol), Serum 08/16/2021 <10 (L)  10 - 30 ug/mL Final   Comment: (NOTE) Therapeutic concentrations vary significantly. A range of 10-30 ug/mL  may be an effective concentration for many patients. However, some  are best treated at concentrations outside of this range. Acetaminophen  concentrations >150 ug/mL at 4 hours after ingestion  and >50 ug/mL at 12 hours after ingestion are often associated with  toxic reactions.  Performed at [facility], [address] [place], [place] [address]    WBC 08/16/2021 6.1  4.0 - 10.5 K/uL Final   RBC 08/16/2021 4.56  4.22 - 5.81 MIL/uL Final   Hemoglobin 08/16/2021 12.9 (L)  13.0 - 17.0 g/dL Final   HCT 64/40/3474 40.0  39.0 - 52.0 % Final   MCV 08/16/2021 87.7  80.0 - 100.0 fL Final   MCH 08/16/2021 28.3  26.0 - 34.0 pg Final   MCHC 08/16/2021 32.3  30.0 - 36.0 g/dL Final   RDW 25/95/6387 13.2  11.5 - 15.5 % Final   Platelets 08/16/2021 318  150 - 400 K/uL  Final   nRBC 08/16/2021 0.0  0.0 - 0.2 % Final   Performed at [facility], [address] [place], [place] [address]   Opiates 08/16/2021 NONE DETECTED  NONE DETECTED Final   Cocaine 08/16/2021 POSITIVE (A)  NONE DETECTED Final   Benzodiazepines 08/16/2021 NONE DETECTED  NONE DETECTED Final   Amphetamines 08/16/2021 POSITIVE (A)  NONE DETECTED Final   Tetrahydrocannabinol 08/16/2021 POSITIVE (A)  NONE DETECTED Final   Barbiturates 08/16/2021 NONE DETECTED  NONE DETECTED Final   Comment: (NOTE) DRUG SCREEN FOR MEDICAL PURPOSES ONLY.  IF CONFIRMATION IS NEEDED FOR ANY PURPOSE, NOTIFY LAB WITHIN 5 DAYS.  LOWEST DETECTABLE LIMITS FOR URINE DRUG SCREEN Drug Class                     Cutoff (ng/mL) Amphetamine and metabolites    1000 Barbiturate and metabolites    200 Benzodiazepine                 200 Tricyclics and metabolites     300 Opiates and metabolites        300 Cocaine and metabolites        300 THC                            50 Performed at [facility], [address] [place], [place] [address]   Admission on 05/11/2021, Discharged on 05/16/2021  Component Date Value Ref Range Status   Total Protein 05/13/2021 7.0  6.5 - 8.1 g/dL Final   Albumin 09/81/191401/31/2023 3.8  3.5 - 5.0 g/dL Final   AST 78/29/562101/31/2023 26  15 - 41 U/L Final   ALT  05/13/2021 26  0 - 44 U/L Final   Alkaline Phosphatase 05/13/2021 64  38 - 126 U/L Final   Total Bilirubin 05/13/2021 0.6  0.3 - 1.2 mg/dL Final   Bilirubin, Direct 05/13/2021 0.1  0.0 - 0.2 mg/dL Final   Indirect Bilirubin 05/13/2021 0.5  0.3 - 0.9 mg/dL Final   Performed at [facility], [address] [place], [place] [address]   HIV Screen 4th Generation wRfx 05/13/2021 Non Reactive  Non Reactive Final   Performed at [facility], [address] [place], [place] [address]   Hepatitis B Surface Ag 05/13/2021 NON REACTIVE  NON REACTIVE Final   HCV Ab 05/13/2021 Reactive (A)  NON REACTIVE Final   Comment: (NOTE) The CDC recommends that a Reactive HCV antibody result be followed up  with a HCV Nucleic Acid Amplification test.     Hep A IgM 05/13/2021 NON REACTIVE  NON REACTIVE Final   Hep B C IgM 05/13/2021 NON REACTIVE  NON REACTIVE Final   Performed at [facility], [address] [place], [place] [address]   Neisseria Gonorrhea 05/13/2021 Negative   Final   Chlamydia 05/13/2021 Negative   Final   Comment 05/13/2021 Normal Reference Ranger Chlamydia - Negative   Final   Comment 05/13/2021 Normal Reference Range Neisseria Gonorrhea - Negative   Final   RPR Ser Ql 05/13/2021 NON REACTIVE  NON REACTIVE Final   Performed at [facility], [address] [place], [place] [address]   HCV RNA, Quantitation 05/14/2021 HCV Not Detected  IU/mL Final   No evidence of active HCV infection.   Test Information: 05/14/2021 Comment   Final   Comment: (NOTE) The quantitative range of this assay is  15 IU/mL to 100 million IU/mL. Performed At: [facility] [address] [place], [place] [id number] Jolene Schimke MD WU:[id number]    Glucose-Capillary 05/15/2021 86  70 - 99 mg/dL Final   Glucose reference range applies only to samples taken after fasting for at least 8 hours.   Sodium 05/15/2021 139  135 - 145 mmol/L Final   Potassium 05/15/2021 4.1   3.5 - 5.1 mmol/L Final   Chloride 05/15/2021 106  98 - 111 mmol/L Final   CO2 05/15/2021 25  22 - 32 mmol/L Final   Glucose, Bld 05/15/2021 94  70 - 99 mg/dL Final   Glucose reference range applies only to samples taken after fasting for at least 8 hours.   BUN 05/15/2021 18  6 - 20 mg/dL Final   Creatinine, Ser 05/15/2021 0.83  0.61 - 1.24 mg/dL Final   Calcium 95/62/1308 9.4  8.9 - 10.3 mg/dL Final   GFR, Estimated 05/15/2021 >60  >60 mL/min Final   Comment: (NOTE) Calculated using the CKD-EPI Creatinine Equation (2021)    Anion gap 05/15/2021 8  5 - 15 Final   Performed at [facility], [address], [place], [place] [address]   Troponin I (High Sensitivity) 05/15/2021 4  <18 ng/L Final   Comment: (NOTE) Elevated high sensitivity troponin I (hsTnI) values and significant  changes across serial measurements may suggest ACS but many other  chronic and acute conditions are known to elevate hsTnI results.  Refer to the "Links" section for chest pain algorithms and additional  guidance. Performed at [facility], [address] [place], [place] [address]    WBC 05/15/2021 7.1  4.0 - 10.5 K/uL Final   RBC 05/15/2021 4.92  4.22 - 5.81 MIL/uL Final   Hemoglobin 05/15/2021 13.8  13.0 - 17.0 g/dL Final   HCT 52/84/1324 42.5  39.0 - 52.0 % Final   MCV 05/15/2021 86.4  80.0 - 100.0 fL Final   MCH 05/15/2021 28.0  26.0 - 34.0 pg Final   MCHC 05/15/2021 32.5  30.0 - 36.0 g/dL Final   RDW 40/01/2724 13.2  11.5 - 15.5 % Final   Platelets 05/15/2021 328  150 - 400 K/uL Final   nRBC 05/15/2021 0.0  0.0 - 0.2 % Final   Performed at [facility], [address], [place], [place] [address]   D-Dimer, Quant 05/15/2021 <0.27  0.00 - 0.50 ug/mL-FEU Final   Comment: (NOTE) At the manufacturer cut-off value of 0.5 g/mL FEU, this assay has a negative predictive value of 95-100%.This assay is intended for use in conjunction with a clinical pretest probability (PTP)  assessment model to exclude pulmonary embolism (PE) and deep venous thrombosis (DVT) in outpatients suspected of PE or DVT. Results should be correlated with clinical presentation. Performed at [facility], [address], [place], [place] [address]    Troponin I (High Sensitivity) 05/15/2021 4  <18 ng/L Final   Comment: (NOTE) Elevated high sensitivity troponin I (hsTnI) values and significant  changes across serial measurements may suggest ACS but many other  chronic and acute conditions are known to elevate hsTnI results.  Refer to the "Links" section for chest pain algorithms and additional  guidance. Performed at [facility], [address], [place], [place] [address]    Sodium 05/15/2021 136  135 - 145 mmol/L Final   Potassium 05/15/2021 4.7  3.5 - 5.1 mmol/L Final   Chloride 05/15/2021 103  98 - 111  mmol/L Final   CO2 05/15/2021 20 (L)  22 - 32 mmol/L Final   Glucose, Bld 05/15/2021 120 (H)  70 - 99 mg/dL Final   Glucose reference range applies only to samples taken after fasting for at least 8 hours.   BUN 05/15/2021 23 (H)  6 - 20 mg/dL Final   Creatinine, Ser 05/15/2021 0.92  0.61 - 1.24 mg/dL Final   Calcium 16/01/9603 9.1  8.9 - 10.3 mg/dL Final   GFR, Estimated 05/15/2021 >60  >60 mL/min Final   Comment: (NOTE) Calculated using the CKD-EPI Creatinine Equation (2021)    Anion gap 05/15/2021 13  5 - 15 Final   Performed at [facility], [address], [place], [place] [address]   WBC 05/15/2021 12.8 (H)  4.0 - 10.5 K/uL Final   RBC 05/15/2021 4.87  4.22 - 5.81 MIL/uL Final   Hemoglobin 05/15/2021 14.2  13.0 - 17.0 g/dL Final   HCT 11/91/4782 44.1  39.0 - 52.0 % Final   MCV 05/15/2021 90.6  80.0 - 100.0 fL Final   MCH 05/15/2021 29.2  26.0 - 34.0 pg Final   MCHC 05/15/2021 32.2  30.0 - 36.0 g/dL Final   RDW 95/62/1308 13.3  11.5 - 15.5 % Final   Platelets 05/15/2021 376  150 - 400 K/uL Final   nRBC 05/15/2021 0.0  0.0 - 0.2 % Final   Performed  at [facility], [address] [address], [place], [place] [address]   Troponin I (High Sensitivity) 05/15/2021 4  <18 ng/L Final   Comment: (NOTE) Elevated high sensitivity troponin I (hsTnI) values and significant  changes across serial measurements may suggest ACS but many other  chronic and acute conditions are known to elevate hsTnI results.  Refer to the "Links" section for chest pain algorithms and additional  guidance. Performed at [facility], [address], [place], [place] [address]    TSH 05/15/2021 5.604 (H)  0.350 - 4.500 uIU/mL Final   Comment: Performed by a 3rd Generation assay with a functional sensitivity of <=0.01 uIU/mL. Performed at [facility], [address] [place], [place] [address]    Troponin I (High Sensitivity) 05/15/2021 5  <18 ng/L Final   Comment: (NOTE) Elevated high sensitivity troponin I (hsTnI) values and significant  changes across serial measurements may suggest ACS but many other  chronic and acute conditions are known to elevate hsTnI results.  Refer to the "Links" section for chest pain algorithms and additional  guidance. Performed at [facility], [address] [place], [place] [address]   Admission on 05/10/2021, Discharged on 05/11/2021  Component Date Value Ref Range Status   SARS Coronavirus 2 by RT PCR 05/10/2021 NEGATIVE  NEGATIVE Final   Comment: (NOTE) SARS-CoV-2 target nucleic acids are NOT DETECTED.  The SARS-CoV-2 RNA is generally detectable in upper respiratory specimens during the acute phase of infection. The lowest concentration of SARS-CoV-2 viral copies this assay can detect is 138 copies/mL. A negative result does not preclude SARS-Cov-2 infection and should not be used as the sole basis for treatment or other patient management decisions. A negative result may occur with  improper specimen collection/handling, submission of specimen other than nasopharyngeal swab, presence of viral mutation(s)  within the areas targeted by this assay, and inadequate number of viral copies(<138 copies/mL). A negative result must be combined with clinical observations, patient history, and epidemiological information. The expected result is Negative.  Fact Sheet for Patients:  BloggerCourse.com  Fact Sheet for Healthcare Providers:  SeriousBroker.it  This test is no                          t yet approved or cleared by the [place] FDA and  has been authorized for detection and/or diagnosis of SARS-CoV-2 by FDA under an Emergency Use Authorization (EUA). This EUA will remain  in effect (meaning this test can be used) for the duration of the COVID-19 declaration under Section 564(b)(1) of the Act, 21 U.S.C.section 360bbb-3(b)(1), unless the authorization is terminated  or revoked sooner.       Influenza A by PCR 05/10/2021 NEGATIVE  NEGATIVE Final   Influenza B by PCR 05/10/2021 NEGATIVE  NEGATIVE Final   Comment: (NOTE) The Xpert Xpress SARS-CoV-2/FLU/RSV plus assay is intended as an aid in the diagnosis of influenza from Nasopharyngeal swab specimens and should not be used as a sole basis for treatment. Nasal washings and aspirates are unacceptable for Xpert Xpress SARS-CoV-2/FLU/RSV testing.  Fact Sheet for Patients: BloggerCourse.com  Fact Sheet for Healthcare Providers: SeriousBroker.it  This test is not yet approved or cleared by the [place] FDA and has been authorized for detection and/or diagnosis of SARS-CoV-2 by FDA under an Emergency Use Authorization (EUA). This EUA will remain in effect (meaning this test can be used) for the duration of the COVID-19 declaration under Section 564(b)(1) of the Act, 21 U.S.C. section 360bbb-3(b)(1), unless the authorization is terminated or revoked.  Performed at [facility], [address] [place], [place] [address]     Sodium 05/10/2021 139  135 - 145 mmol/L Final   Potassium 05/10/2021 3.9  3.5 - 5.1 mmol/L Final   Chloride 05/10/2021 103  98 - 111 mmol/L Final   CO2 05/10/2021 25  22 - 32 mmol/L Final   Glucose, Bld 05/10/2021 107 (H)  70 - 99 mg/dL Final   Glucose reference range applies only to samples taken after fasting for at least 8 hours.   BUN 05/10/2021 9  6 - 20 mg/dL Final   Creatinine, Ser 05/10/2021 0.86  0.61 - 1.24 mg/dL Final   Calcium 60/45/4098 9.8  8.9 - 10.3 mg/dL Final   Total Protein 11/91/4782 7.2  6.5 - 8.1 g/dL Final   Albumin 95/62/1308 3.7  3.5 - 5.0 g/dL Final   AST 65/78/4696 46 (H)  15 - 41 U/L Final   ALT 05/10/2021 40  0 - 44 U/L Final   Alkaline Phosphatase 05/10/2021 76  38 - 126 U/L Final   Total Bilirubin 05/10/2021 0.8  0.3 - 1.2 mg/dL Final   GFR, Estimated 05/10/2021 >60  >60 mL/min Final   Comment: (NOTE) Calculated using the CKD-EPI Creatinine Equation (2021)    Anion gap 05/10/2021 11  5 - 15 Final   Performed at [facility], [address], [place], [place] [address]   WBC 05/10/2021 5.3  4.0 - 10.5 K/uL Final   RBC 05/10/2021 5.21  4.22 - 5.81 MIL/uL Final   Hemoglobin 05/10/2021 14.9  13.0 - 17.0 g/dL Final   HCT 41/32/4401 44.3  39.0 - 52.0 % Final   MCV 05/10/2021 85.0  80.0 - 100.0 fL Final   MCH 05/10/2021 28.6  26.0 - 34.0 pg Final   MCHC 05/10/2021 33.6  30.0 - 36.0 g/dL Final   RDW 02/72/5366 12.6  11.5 - 15.5 % Final   Platelets 05/10/2021 368  150 - 400 K/uL Final  nRBC 05/10/2021 0.0  0.0 - 0.2 % Final   Neutrophils Relative % 05/10/2021 78  % Final   Neutro Abs 05/10/2021 4.1  1.7 - 7.7 K/uL Final   Lymphocytes Relative 05/10/2021 16  % Final   Lymphs Abs 05/10/2021 0.8  0.7 - 4.0 K/uL Final   Monocytes Relative 05/10/2021 6  % Final   Monocytes Absolute 05/10/2021 0.3  0.1 - 1.0 K/uL Final   Eosinophils Relative 05/10/2021 0  % Final   Eosinophils Absolute 05/10/2021 0.0  0.0 - 0.5 K/uL Final   Basophils Relative 05/10/2021  0  % Final   Basophils Absolute 05/10/2021 0.0  0.0 - 0.1 K/uL Final   Immature Granulocytes 05/10/2021 0  % Final   Abs Immature Granulocytes 05/10/2021 0.02  0.00 - 0.07 K/uL Final   Performed at [facility], [address] [place], [place] [address]   Opiates 05/10/2021 NONE DETECTED  NONE DETECTED Final   Cocaine 05/10/2021 POSITIVE (A)  NONE DETECTED Final   Benzodiazepines 05/10/2021 NONE DETECTED  NONE DETECTED Final   Amphetamines 05/10/2021 POSITIVE (A)  NONE DETECTED Final   Tetrahydrocannabinol 05/10/2021 POSITIVE (A)  NONE DETECTED Final   Barbiturates 05/10/2021 NONE DETECTED  NONE DETECTED Final   Comment: (NOTE) DRUG SCREEN FOR MEDICAL PURPOSES ONLY.  IF CONFIRMATION IS NEEDED FOR ANY PURPOSE, NOTIFY LAB WITHIN 5 DAYS.  LOWEST DETECTABLE LIMITS FOR URINE DRUG SCREEN Drug Class                     Cutoff (ng/mL) Amphetamine and metabolites    1000 Barbiturate and metabolites    200 Benzodiazepine                 200 Tricyclics and metabolites     300 Opiates and metabolites        300 Cocaine and metabolites        300 THC                            50 Performed at [facility], [address] [place], [place] [address]    Alcohol, Ethyl (B) 05/10/2021 <10  <10 mg/dL Final   Comment: (NOTE) Lowest detectable limit for serum alcohol is 10 mg/dL.  For medical purposes only. Performed at [facility], [address], [place], [place] [address]    Color, Urine 05/10/2021 YELLOW  YELLOW Final   APPearance 05/10/2021 CLEAR  CLEAR Final   Specific Gravity, Urine 05/10/2021 1.020  1.005 - 1.030 Final   pH 05/10/2021 7.0  5.0 - 8.0 Final   Glucose, UA 05/10/2021 NEGATIVE  NEGATIVE mg/dL Final   Hgb urine dipstick 05/10/2021 NEGATIVE  NEGATIVE Final   Bilirubin Urine 05/10/2021 NEGATIVE  NEGATIVE Final   Ketones, ur 05/10/2021 NEGATIVE  NEGATIVE mg/dL Final   Protein, ur 21/30/8657 100 (A)  NEGATIVE mg/dL Final   Nitrite 84/69/6295 NEGATIVE   NEGATIVE Final   Leukocytes,Ua 05/10/2021 NEGATIVE  NEGATIVE Final   RBC / HPF 05/10/2021 0-5  0 - 5 RBC/hpf Final   WBC, UA 05/10/2021 0-5  0 - 5 WBC/hpf Final   Bacteria, UA 05/10/2021 NONE SEEN  NONE SEEN Final   Squamous Epithelial / LPF 05/10/2021 0-5  0 - 5 Final   Mucus 05/10/2021 PRESENT   Final   Performed at [facility], [address], [place], [place] [address]    Allergies: Haldol [haloperidol]  PTA Medications: (Not in a hospital admission)   Medical Decision Making  Patient admitted to the [facility] for continuous assessment for overnight observation. Patient may benefit from an admission on the [facility] to complete opiate detox if no psychosis observed overnight. Patient is voluntary.   Labs reviewed.   QTC is 425 UDS positive for cocaine and THC. BAL is negative.   Per chart review, previous home medications restarted at a low or divided dose for tolerability.  Olanzapine 5 mg p.o. twice daily  Gabapentin 100 mg p.o. 3 times daily   cloNIDine  0.1 mg Oral QID   Followed by   Melene Muller ON 09/14/2021] cloNIDine  0.1 mg Oral BH-qamhs   Followed by   Melene Muller ON 09/16/2021] cloNIDine  0.1 mg Oral QAC breakfast   gabapentin  100 mg Oral TID   OLANZapine  5 mg Oral BID   COWs initiated for opiate withdrawal  Recommendations  Based on my evaluation the patient does not appear to have an emergency medical condition.  Layla Barter, NP 09/12/21  7:53 AM

## 2021-09-12 NOTE — ED Triage Notes (Signed)
Pt reports feeling suicidal with a plan to overdose. States he regularly uses heroin, last use Wednesday.

## 2021-09-12 NOTE — ED Notes (Signed)
Patient complaints of muscle cramps
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
## 2021-09-19 ENCOUNTER — Other Ambulatory Visit: Payer: Self-pay

## 2021-09-19 ENCOUNTER — Encounter (HOSPITAL_COMMUNITY): Payer: Self-pay | Admitting: Emergency Medicine

## 2021-09-19 ENCOUNTER — Emergency Department (HOSPITAL_COMMUNITY)
Admission: EM | Admit: 2021-09-19 | Discharge: 2021-09-19 | Discharge status: Against Medical Advice | Payer: 59 | Attending: Emergency Medicine | Admitting: Emergency Medicine

## 2021-09-19 DIAGNOSIS — Z046 Encounter for general psychiatric examination, requested by authority: Secondary | ICD-10-CM | POA: Insufficient documentation

## 2021-09-19 DIAGNOSIS — G4700 Insomnia, unspecified: Secondary | ICD-10-CM | POA: Insufficient documentation

## 2021-09-19 DIAGNOSIS — Z5900 Homelessness unspecified: Secondary | ICD-10-CM | POA: Diagnosis not present

## 2021-09-19 DIAGNOSIS — Z5321 Procedure and treatment not carried out due to patient leaving prior to being seen by health care provider: Secondary | ICD-10-CM | POA: Insufficient documentation

## 2021-09-19 DIAGNOSIS — Z760 Encounter for issue of repeat prescription: Secondary | ICD-10-CM | POA: Diagnosis not present

## 2021-09-19 NOTE — ED Provider Notes (Signed)
Patient is she came in for medication refill.  He was recently observed at [facility], and was discharged with BuSpar and Zyprexa.  He presented to the ER requesting medication refill.  Had no complaints.  On reevaluation patient had mentioned to nursing staff that he had interest in being voluntarily committed.  He states that he hears voices chronically.  Sometimes these voices are accusatory and accused him of raping children or hurting people.  He states that he has no interest in hurting himself or other people.  He reports being homeless and having insomnia.  States he is off all his medications, and often feels this way when he is not on them.  He is concerned about what might happen if he does not get his medicine.  Initially seemed as though there is some miscommunication about having urges to rape children.  I asked patient very clearly if he had any thoughts or intention of doing this, and he adamantly denied this.  Before I was able to discuss his case with my attending physician and determine the patient's disposition, he eloped from the emergency department.  He was not under involuntary commitment, and after our thorough discussion I did not believe that he required that at the time of his visit.   Cody Poole 09/19/21 [id number]    Sherwood Gambler, MD 09/22/21 [phone]

## 2021-09-19 NOTE — ED Triage Notes (Addendum)
  Patient comes in for medication refill.  States he takes zyprexa and ran out.  States he has to take that because he is allergic to haldol.  No pain at this time.    Patient initially came in for medication refill but upon completion of triage he stated that he wanted to be voluntarily committed.  Patient states he hears voices and has urges to rape children.  States he is homeless and cannot sleep at night.  Patient states he is off all his medication and feels this way when he doesn't have them.  Does not endorse SI/HI.

## 2021-09-19 NOTE — ED Notes (Signed)
Pt walked out of room and towards the exit. He stated that he was going to leave. I attempted to convince him to stay for evaluation but he refused.

## 2021-09-21 ENCOUNTER — Emergency Department (HOSPITAL_COMMUNITY): Admission: EM | Admit: 2021-09-21 | Discharge: 2021-09-22 | Discharge status: Against Medical Advice | Payer: 59

## 2021-09-21 ENCOUNTER — Other Ambulatory Visit: Payer: Self-pay

## 2021-09-23 ENCOUNTER — Emergency Department (HOSPITAL_COMMUNITY): Admission: EM | Admit: 2021-09-23 | Discharge: 2021-09-23 | Discharge status: Against Medical Advice | Payer: 59

## 2021-09-23 ENCOUNTER — Encounter (HOSPITAL_COMMUNITY): Payer: Self-pay

## 2021-09-23 ENCOUNTER — Other Ambulatory Visit: Payer: Self-pay

## 2021-09-23 ENCOUNTER — Emergency Department (HOSPITAL_COMMUNITY): Payer: No Typology Code available for payment source

## 2021-09-23 ENCOUNTER — Emergency Department (HOSPITAL_COMMUNITY)
Admission: EM | Admit: 2021-09-23 | Discharge: 2021-09-23 | Disposition: A | Discharge status: Home / Self Care | Payer: No Typology Code available for payment source | Attending: Emergency Medicine | Admitting: Emergency Medicine

## 2021-09-23 DIAGNOSIS — R45851 Suicidal ideations: Secondary | ICD-10-CM

## 2021-09-23 DIAGNOSIS — Z20822 Contact with and (suspected) exposure to covid-19: Secondary | ICD-10-CM | POA: Diagnosis not present

## 2021-09-23 DIAGNOSIS — R1032 Left lower quadrant pain: Secondary | ICD-10-CM | POA: Diagnosis present

## 2021-09-23 DIAGNOSIS — F1190 Opioid use, unspecified, uncomplicated: Secondary | ICD-10-CM

## 2021-09-23 DIAGNOSIS — F1120 Opioid dependence, uncomplicated: Secondary | ICD-10-CM | POA: Diagnosis not present

## 2021-09-23 DIAGNOSIS — K5903 Drug induced constipation: Secondary | ICD-10-CM | POA: Diagnosis not present

## 2021-09-23 DIAGNOSIS — F209 Schizophrenia, unspecified: Secondary | ICD-10-CM | POA: Diagnosis not present

## 2021-09-23 LAB — LIPASE, BLOOD: Lipase: 25 U/L (ref 11–51)

## 2021-09-23 LAB — CBC WITH DIFFERENTIAL/PLATELET
Abs Immature Granulocytes: 0.03 10*3/uL (ref 0.00–0.07)
Basophils Absolute: 0 10*3/uL (ref 0.0–0.1)
Basophils Relative: 1 %
Eosinophils Absolute: 0.2 10*3/uL (ref 0.0–0.5)
Eosinophils Relative: 2 %
HCT: 36.6 % — ABNORMAL LOW (ref 39.0–52.0)
Hemoglobin: 11.9 g/dL — ABNORMAL LOW (ref 13.0–17.0)
Immature Granulocytes: 0 %
Lymphocytes Relative: 18 %
Lymphs Abs: 1.5 10*3/uL (ref 0.7–4.0)
MCH: 28.1 pg (ref 26.0–34.0)
MCHC: 32.5 g/dL (ref 30.0–36.0)
MCV: 86.5 fL (ref 80.0–100.0)
Monocytes Absolute: 0.6 10*3/uL (ref 0.1–1.0)
Monocytes Relative: 8 %
Neutro Abs: 6 10*3/uL (ref 1.7–7.7)
Neutrophils Relative %: 71 %
Platelets: 284 10*3/uL (ref 150–400)
RBC: 4.23 MIL/uL (ref 4.22–5.81)
RDW: 13.4 % (ref 11.5–15.5)
WBC: 8.3 10*3/uL (ref 4.0–10.5)
nRBC: 0 % (ref 0.0–0.2)

## 2021-09-23 LAB — COMPREHENSIVE METABOLIC PANEL
ALT: 17 U/L (ref 0–44)
AST: 28 U/L (ref 15–41)
Albumin: 3.8 g/dL (ref 3.5–5.0)
Alkaline Phosphatase: 86 U/L (ref 38–126)
Anion gap: 9 (ref 5–15)
BUN: 18 mg/dL (ref 6–20)
CO2: 28 mmol/L (ref 22–32)
Calcium: 9.1 mg/dL (ref 8.9–10.3)
Chloride: 99 mmol/L (ref 98–111)
Creatinine, Ser: 0.97 mg/dL (ref 0.61–1.24)
GFR, Estimated: >60 mL/min (ref >60)
Glucose, Bld: 81 mg/dL (ref 70–99)
Potassium: 3.5 mmol/L (ref 3.5–5.1)
Sodium: 136 mmol/L (ref 135–145)
Total Bilirubin: 1.2 mg/dL (ref 0.3–1.2)
Total Protein: 6.7 g/dL (ref 6.5–8.1)

## 2021-09-23 LAB — SARS CORONAVIRUS 2 BY RT PCR: SARS Coronavirus 2 by RT PCR: NEGATIVE

## 2021-09-23 MED ORDER — ONDANSETRON 4 MG PO TBDP
4.0000 mg | ORAL_TABLET | Freq: Once | ORAL | Status: AC
  Administered 2021-09-23: 4 mg via ORAL
  Filled 2021-09-23: qty 1

## 2021-09-23 MED ORDER — DICYCLOMINE HCL 10 MG PO CAPS
10.0000 mg | ORAL_CAPSULE | Freq: Once | ORAL | Status: AC
  Administered 2021-09-23: 10 mg via ORAL
  Filled 2021-09-23: qty 1

## 2021-09-23 MED ORDER — POLYETHYLENE GLYCOL 3350 17 GM/SCOOP PO POWD: 1.0000 | Freq: Once | ORAL | 0 refills | Status: AC
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
## 2021-09-23 NOTE — ED Notes (Signed)
MD Pfeiffer made aware of the situation and cleared patient to continue to with plan of care of discharging. AVS discharge paperwork reviewed with patient. Patient has been both medically cleared and cleared by psychiatry. Patient known for lingering in previous visits. Patient provided with all belongings from the locker but states "I'm leaving in these scrubs." Security contacted and escorted patient out d/t patient refusing to leave property.

## 2021-09-23 NOTE — Discharge Instructions (Addendum)
Recommend bowel cleanout with MiraLAX.  Push plenty of fluids.  Recommend 8 capfuls of MiraLAX in a 32 ounce Gatorade over 4 hours, repeat the following day until successful bowel movement.

## 2021-09-23 NOTE — ED Notes (Signed)
Patient's belongings placed in locker 1 

## 2021-09-23 NOTE — ED Provider Triage Note (Signed)
Emergency Medicine Provider Triage Evaluation Note  Cody Poole , a 26 y.o. male  was evaluated in triage.  Pt complains of abdominal pain for the past few hours. LUQ. Constant. No alleviating/aggravating factors. .  Review of Systems  Positive: Abdominal pain Negative: Fever, N/V/D, dysuria, testicular pain/swelling  Physical Exam  BP 125/75 (BP Location: Right Arm)   Pulse 75   Temp 98.3 F (36.8 C) (Oral)   Resp 18   SpO2 97%  Gen:   Awake, no distress   Resp:  Normal effort  MSK:   Moves extremities without difficulty  Other:  Abdomen nontender w/o peritoneal signs.   Medical Decision Making  Medically screening exam initiated at 2:53 AM.  Appropriate orders placed.  Cody Poole was informed that the remainder of the evaluation will be completed by another provider, this initial triage assessment does not replace that evaluation, and the importance of remaining in the ED until their evaluation is complete.  Abdominal pain   Cody Dyke, PA-C 09/23/21 [id number]

## 2021-09-23 NOTE — ED Notes (Addendum)
Per previous RN report, this RN was notified that upon stating patient is discharged patient states "if I go home I will kill myself." MD Pfeiffer made aware of this situation

## 2021-09-23 NOTE — ED Provider Notes (Signed)
[facility] EMERGENCY DEPARTMENT Provider Note   CSN: [id number] Arrival date & time: 09/23/21  0249     History  Chief Complaint  Patient presents with   Abdominal Pain    Cody Poole is a 26 y.o. male.   Abdominal Pain   26 year old male with a history of opiate use disorder on Suboxone who presents to the emergency department with the complaint of abdominal pain.  The patient states that he last used opiates yesterday.  He states that he has had sharp intermittent left lower quadrant abdominal pain for the past few days.  He denies any nausea, vomiting, diarrhea.  He is tolerating oral intake.  He is passing gas.  He denies any fevers or chills.  He denies any dysuria or increased urinary frequency.  He states that his last bowel movement was yesterday and was unremarkable.  He denies any melena or hematochezia.  Home Medications Prior to Admission medications   Medication Sig Start Date End Date Taking? Authorizing Provider  buprenorphine-naloxone (SUBOXONE) 8-2 mg SUBL SL tablet Place 1 tablet under the tongue in the morning and at bedtime.   Yes [provider]  BUSPIRONE HCL PO Take 1 tablet by mouth daily.   Yes [provider]  clonazePAM (KLONOPIN) 1 MG tablet Take 1 mg by mouth daily.   Yes [provider]  gabapentin (NEURONTIN) 300 MG capsule Take 300 mg by mouth daily.   Yes [provider]  OLANZapine (ZYPREXA) 10 MG tablet Take 10 mg by mouth daily.   Yes [provider]  polyethylene glycol powder (GLYCOLAX/MIRALAX) 17 GM/SCOOP powder Take 255 g by mouth once for 1 dose. 09/23/21 09/23/21 Yes Ernie Avena, MD  sertraline (ZOLOFT) 50 MG tablet Take 50 mg by mouth daily.   Yes [provider]  gabapentin (NEURONTIN) 100 MG capsule Take 1 capsule (100 mg total) by mouth 3 (three) times daily. Patient not taking: Reported on 09/23/2021 09/12/21   Liborio Nixon L, NP  OLANZapine (ZYPREXA) 5 MG tablet  Take 1 tablet (5 mg total) by mouth 2 (two) times daily. Patient not taking: Reported on 09/23/2021 09/12/21   Layla Barter, NP      Allergies    Haldol [haloperidol]    Review of Systems   Review of Systems  Gastrointestinal:  Positive for abdominal pain.    Physical Exam Updated Vital Signs BP 124/78 (BP Location: Right Arm)   Pulse (!) 56   Temp 97.6 F (36.4 C) (Oral)   Resp 17   SpO2 100%  Physical Exam Vitals and nursing note reviewed.  Constitutional:      General: He is not in acute distress.    Appearance: He is well-developed.  HENT:     Head: Normocephalic and atraumatic.  Eyes:     Conjunctiva/sclera: Conjunctivae normal.  Cardiovascular:     Rate and Rhythm: Normal rate and regular rhythm.     Heart sounds: No murmur heard. Pulmonary:     Effort: Pulmonary effort is normal. No respiratory distress.     Breath sounds: Normal breath sounds.  Abdominal:     Palpations: Abdomen is soft.     Tenderness: There is abdominal tenderness in the left lower quadrant. There is no guarding or rebound.     Comments: Abdomen soft, nondistended, no rebound or guarding, mild tenderness in the left lower quadrant  Musculoskeletal:        General: No swelling.     Cervical back: Neck  supple.  Skin:    General: Skin is warm and dry.     Capillary Refill: Capillary refill takes less than 2 seconds.  Neurological:     Mental Status: He is alert.  Psychiatric:        Mood and Affect: Mood normal.     ED Results / Procedures / Treatments   Labs (all labs ordered are listed, but only abnormal results are displayed) Labs Reviewed  CBC WITH DIFFERENTIAL/PLATELET - Abnormal; Notable for the following components:      Result Value   Hemoglobin 11.9 (*)    HCT 36.6 (*)    All other components within normal limits  COMPREHENSIVE METABOLIC PANEL  LIPASE, BLOOD    EKG None  Radiology DG Abdomen 1 View  Result Date: 09/23/2021 CLINICAL DATA:  Abdominal pain for the  last few hours in the left upper quadrant. EXAM: ABDOMEN - 1 VIEW COMPARISON:  10/13/2020 FINDINGS: Upper normal amount of formed stool in the colon with superimposed loops of stool filled colon and small bowel in the left upper quadrant, but without visible dilated small bowel. No significant abnormal calcifications are observed. IMPRESSION: 1. Borderline prominence of stool in the colon, potentially from constipation. No dilated small bowel is identified. Electronically Signed   By: Gaylyn RongWalter  Liebkemann M.D.   On: 09/23/2021 09:57    Procedures Procedures    Medications Ordered in ED Medications  dicyclomine (BENTYL) capsule 10 mg (10 mg Oral Given 09/23/21 1003)  ondansetron (ZOFRAN-ODT) disintegrating tablet 4 mg (4 mg Oral Given 09/23/21 1003)    ED Course/ Medical Decision Making/ A&P                           Medical Decision Making Amount and/or Complexity of Data Reviewed Radiology: ordered.  Risk Prescription drug management.   26 year old male with a history of opiate use disorder on Suboxone who presents to the emergency department with the complaint of abdominal pain.  The patient states that he last used opiates yesterday.  He states that he has had sharp intermittent left lower quadrant abdominal pain for the past few days.  He denies any nausea, vomiting, diarrhea.  He is tolerating oral intake.  He is passing gas.  He denies any fevers or chills.  He denies any dysuria or increased urinary frequency.  He states that his last bowel movement was yesterday and was unremarkable.  He denies any melena or hematochezia.  On arrival, the patient was vitally stable, afebrile, hemodynamically stable, not tachycardic or tachypneic, sinus rhythm noted on cardiac telemetry.  Physical exam significant for mild left lower quadrant tenderness to palpation, no rebound or guarding.  Patient is passing gas and did have a bowel movement yesterday.  He endorses chronic opiate use and last used  opiates yesterday and is on Suboxone.  He denies nausea, vomiting, diarrhea and is tolerating oral intake.  No dysuria or increased urinary frequency.  No testicular pain.  No fevers or chills.  Differential diagnosis includes constipation versus diverticular disease although feel that is less likely.  The patient was administered Bentyl, Zofran ODT.  KUB revealed the following: IMPRESSION:  1. Borderline prominence of stool in the colon, potentially from  constipation. No dilated small bowel is identified.   Laboratory evaluation significant for lipase normal, CBC without a leukocytosis, CMP without renal or liver dysfunction, normal electrolytes, CBC with a mild anemia to 11.9, otherwise unremarkable.  Suspect likely drug-induced constipation from  the patient's chronic opiate use.  Low concern for acute intra-abdominal emergency such as diverticulitis, pyelonephritis, appendicitis based on reassuring history, physical exam, laboratory work-up.  The patient was unable to provide a urine sample but denies any urinary symptoms at this time and has no flank pain or CVA tenderness.  Low concern for nephrolithiasis as the patient's symptoms are not sharp but more crampy in nature.  Considered opiate withdrawal although the patient most recently used yesterday.  Symptoms are controlled at this time on reassessment with the patient resting comfortably.  Recommended MiraLAX for likely opiate-induced constipation.  Medically cleared at this time for discharge.  Prior to discharge, the patient endorsed active SI with a plan to harm himself.  He presents voluntarily.  No indication for IVC at this time.  TTS consult placed.    Final Clinical Impression(s) / ED Diagnoses Final diagnoses:  Drug-induced constipation  Opiate use    Rx / DC Orders ED Discharge Orders          Ordered    polyethylene glycol powder (GLYCOLAX/MIRALAX) 17 GM/SCOOP powder   Once        09/23/21 1001               Ernie Avena, MD 09/23/21 1008

## 2021-09-23 NOTE — ED Triage Notes (Signed)
Pt reports he is here today due to abd pain for a few days. Pt denies any N&V&D.

## 2021-09-23 NOTE — ED Notes (Signed)
Awaiting response from MD.  

## 2021-09-23 NOTE — ED Notes (Addendum)
Pt in room sleeping. NAD. Chest rising and falling. Pt easily arousable. To take meds. Once pt instructed to get dressed. Pt reports he wanted to voluntarily commit himself for SI. MD made aware. Pt reports cramping in L upper abd.

## 2021-09-28 ENCOUNTER — Ambulatory Visit (HOSPITAL_COMMUNITY)
Admission: EM | Admit: 2021-09-28 | Discharge: 2021-09-28 | Disposition: A | Discharge status: Home / Self Care | Payer: 59

## 2021-09-28 ENCOUNTER — Emergency Department (HOSPITAL_COMMUNITY): Admission: EM | Admit: 2021-09-28 | Discharge: 2021-09-28 | Discharge status: Against Medical Advice | Payer: 59

## 2021-09-28 NOTE — ED Triage Notes (Signed)
Patient was asking for a suboxone refill and then decided he wanted to leave., Patient walked out of the triage area.  Patient mumbling and appeared sleepy.

## 2021-09-30 ENCOUNTER — Telehealth (HOSPITAL_COMMUNITY): Payer: Self-pay

## 2021-09-30 NOTE — BH Assessment (Signed)
Care Management - [facility]  Follow Up Discharges   Writer attempted to make contact with patient today and was unsuccessful.  Phone is disconnected.   Per chart review, patient is homeless and he was provided with outpatient resources.

## 2021-10-08 ENCOUNTER — Other Ambulatory Visit: Payer: Self-pay

## 2021-10-08 ENCOUNTER — Encounter (HOSPITAL_COMMUNITY): Payer: Self-pay

## 2021-10-08 ENCOUNTER — Emergency Department (HOSPITAL_COMMUNITY)
Admission: EM | Admit: 2021-10-08 | Discharge: 2021-10-08 | Discharge status: Against Medical Advice | Payer: No Typology Code available for payment source | Attending: Emergency Medicine | Admitting: Emergency Medicine

## 2021-10-08 ENCOUNTER — Emergency Department (HOSPITAL_COMMUNITY)
Admission: EM | Admit: 2021-10-08 | Discharge: 2021-10-08 | Disposition: A | Discharge status: Home / Self Care | Payer: No Typology Code available for payment source | Source: Home / Self Care | Attending: Emergency Medicine | Admitting: Emergency Medicine

## 2021-10-08 DIAGNOSIS — R109 Unspecified abdominal pain: Secondary | ICD-10-CM | POA: Insufficient documentation

## 2021-10-08 DIAGNOSIS — Z760 Encounter for issue of repeat prescription: Secondary | ICD-10-CM | POA: Insufficient documentation

## 2021-10-08 DIAGNOSIS — Z5321 Procedure and treatment not carried out due to patient leaving prior to being seen by health care provider: Secondary | ICD-10-CM | POA: Insufficient documentation

## 2021-10-08 LAB — COMPREHENSIVE METABOLIC PANEL
ALT: 92 U/L — ABNORMAL HIGH (ref 0–44)
AST: 79 U/L — ABNORMAL HIGH (ref 15–41)
Albumin: 4.5 g/dL (ref 3.5–5.0)
Alkaline Phosphatase: 71 U/L (ref 38–126)
Anion gap: 9 (ref 5–15)
BUN: 22 mg/dL — ABNORMAL HIGH (ref 6–20)
CO2: 27 mmol/L (ref 22–32)
Calcium: 9.5 mg/dL (ref 8.9–10.3)
Chloride: 101 mmol/L (ref 98–111)
Creatinine, Ser: 1.09 mg/dL (ref 0.61–1.24)
GFR, Estimated: >60 mL/min (ref >60)
Glucose, Bld: 95 mg/dL (ref 70–99)
Potassium: 3.9 mmol/L (ref 3.5–5.1)
Sodium: 137 mmol/L (ref 135–145)
Total Bilirubin: 0.9 mg/dL (ref 0.3–1.2)
Total Protein: 7.9 g/dL (ref 6.5–8.1)

## 2021-10-08 LAB — CBC WITH DIFFERENTIAL/PLATELET
Abs Immature Granulocytes: 0.03 10*3/uL (ref 0.00–0.07)
Basophils Absolute: 0.1 10*3/uL (ref 0.0–0.1)
Basophils Relative: 1 %
Eosinophils Absolute: 0.1 10*3/uL (ref 0.0–0.5)
Eosinophils Relative: 1 %
HCT: 42.4 % (ref 39.0–52.0)
Hemoglobin: 13.7 g/dL (ref 13.0–17.0)
Immature Granulocytes: 0 %
Lymphocytes Relative: 10 %
Lymphs Abs: 0.9 10*3/uL (ref 0.7–4.0)
MCH: 28.1 pg (ref 26.0–34.0)
MCHC: 32.3 g/dL (ref 30.0–36.0)
MCV: 86.9 fL (ref 80.0–100.0)
Monocytes Absolute: 0.8 10*3/uL (ref 0.1–1.0)
Monocytes Relative: 10 %
Neutro Abs: 6.7 10*3/uL (ref 1.7–7.7)
Neutrophils Relative %: 78 %
Platelets: 357 10*3/uL (ref 150–400)
RBC: 4.88 MIL/uL (ref 4.22–5.81)
RDW: 14.1 % (ref 11.5–15.5)
WBC: 8.6 10*3/uL (ref 4.0–10.5)
nRBC: 0 % (ref 0.0–0.2)

## 2021-10-08 MED ORDER — SERTRALINE HCL 50 MG PO TABS
50.0000 mg | ORAL_TABLET | Freq: Once | ORAL | Status: AC
  Administered 2021-10-08: 50 mg via ORAL
  Filled 2021-10-08: qty 1

## 2021-10-08 MED ORDER — BUSPIRONE HCL 10 MG PO TABS
5.0000 mg | ORAL_TABLET | Freq: Once | ORAL | Status: AC
  Administered 2021-10-08: 5 mg via ORAL
  Filled 2021-10-08: qty 1

## 2021-10-08 MED ORDER — OLANZAPINE 10 MG PO TABS: 10.0000 mg | ORAL_TABLET | Freq: Every day | ORAL | Status: DC

## 2021-10-08 MED ORDER — CLONAZEPAM 0.5 MG PO TABS
1.0000 mg | ORAL_TABLET | Freq: Once | ORAL | Status: AC
  Administered 2021-10-08: 1 mg via ORAL
  Filled 2021-10-08: qty 2

## 2021-10-08 MED ORDER — GABAPENTIN 300 MG PO CAPS
300.0000 mg | ORAL_CAPSULE | Freq: Once | ORAL | Status: AC
  Administered 2021-10-08: 300 mg via ORAL
  Filled 2021-10-08: qty 1

## 2021-10-08 NOTE — ED Triage Notes (Signed)
Pt walked out AMA and states that he was just having a moment. Pt reports back with abdominal pain and would like his medications.

## 2021-10-08 NOTE — ED Notes (Signed)
Pt came to desk asking for his belongings, denies HI or SI. Pt is A&O x4, able to make own decisions. Advised patient to wait for provider to see him due to the high heart rate. Refused and walked out of the ed.

## 2021-10-08 NOTE — Discharge Instructions (Signed)
Fill your prescriptions and begin taking as prescribed.  Follow-up with your primary doctor.

## 2021-10-08 NOTE — ED Triage Notes (Signed)
Pt states that he needs his medication refilled and needs a shower.

## 2021-10-08 NOTE — ED Notes (Signed)
Pt ambulatory without assistance.  

## 2021-10-08 NOTE — ED Notes (Signed)
Attempted to get blood work but patient stated to take the needle out and doesn't want blood work to be done. He just wants his medication.

## 2021-10-08 NOTE — ED Provider Notes (Signed)
[facility]-EMERGENCY DEPT Provider Note   CSN: [id number] Arrival date & time: 10/08/21  0204     History  Chief Complaint  Patient presents with   Abdominal Pain   Medication Refill    Abrham Poole is a 26 y.o. male.  Patient is a 26 year old male with past medical history of polysubstance abuse, schizophrenia, PTSD.  Patient presenting today requesting a refill of his medications.  Apparently he has been out of all of his home medications for the past several days.  He denies to me he is experiencing other issues.  The history is provided by the patient.       Home Medications Prior to Admission medications   Medication Sig Start Date End Date Taking? Authorizing Provider  buprenorphine-naloxone (SUBOXONE) 8-2 mg SUBL SL tablet Place 1 tablet under the tongue in the morning and at bedtime.    [provider]  BUSPIRONE HCL PO Take 1 tablet by mouth daily.    [provider]  clonazePAM (KLONOPIN) 1 MG tablet Take 1 mg by mouth daily.    [provider]  gabapentin (NEURONTIN) 100 MG capsule Take 1 capsule (100 mg total) by mouth 3 (three) times daily. Patient not taking: Reported on 09/23/2021 09/12/21   Liborio Nixon L, NP  gabapentin (NEURONTIN) 300 MG capsule Take 300 mg by mouth daily.    [provider]  OLANZapine (ZYPREXA) 10 MG tablet Take 10 mg by mouth daily.    [provider]  OLANZapine (ZYPREXA) 5 MG tablet Take 1 tablet (5 mg total) by mouth 2 (two) times daily. Patient not taking: Reported on 09/23/2021 09/12/21   Layla Barter, NP  sertraline (ZOLOFT) 50 MG tablet Take 50 mg by mouth daily.    [provider]      Allergies    Haldol [haloperidol]    Review of Systems   Review of Systems  All other systems reviewed and are negative.   Physical Exam Updated Vital Signs BP 113/85 (BP Location: Left Arm)   Pulse (!) 149   Temp 98 F (36.7 C) (Oral)   Resp 16   SpO2 95%   Physical Exam Vitals and nursing note reviewed.  Constitutional:      General: He is not in acute distress.    Appearance: He is well-developed. He is not diaphoretic.  HENT:     Head: Normocephalic and atraumatic.  Cardiovascular:     Rate and Rhythm: Normal rate and regular rhythm.     Heart sounds: No murmur heard.    No friction rub.  Pulmonary:     Effort: Pulmonary effort is normal. No respiratory distress.     Breath sounds: Normal breath sounds. No wheezing or rales.  Abdominal:     General: Bowel sounds are normal. There is no distension.     Palpations: Abdomen is soft.     Tenderness: There is no abdominal tenderness.  Musculoskeletal:        General: Normal range of motion.     Cervical back: Normal range of motion and neck supple.  Skin:    General: Skin is warm and dry.  Neurological:     Mental Status: He is alert and oriented to person, place, and time.     Coordination: Coordination normal.     ED Results / Procedures / Treatments   Labs (all labs ordered are listed, but only abnormal results are displayed) Labs Reviewed - No data to display  EKG  None  Radiology No results found.  Procedures Procedures    Medications Ordered in ED Medications  busPIRone (BUSPAR) tablet 5 mg (has no administration in time range)  clonazePAM (KLONOPIN) tablet 1 mg (has no administration in time range)  gabapentin (NEURONTIN) capsule 300 mg (has no administration in time range)  OLANZapine (ZYPREXA) tablet 10 mg (has no administration in time range)  sertraline (ZOLOFT) tablet 50 mg (has no administration in time range)    ED Course/ Medical Decision Making/ A&P  Patient presenting requesting doses of his medications.  He apparently has refills which he was unable to fill at the pharmacy.  Patient given doses of his medications, and seems appropriate for discharge.  Final Clinical Impression(s) / ED Diagnoses Final diagnoses:  None    Rx / DC Orders ED  Discharge Orders     None         Geoffery Lyons, MD 10/08/21 [phone]

## 2021-10-14 ENCOUNTER — Encounter (HOSPITAL_COMMUNITY): Payer: Self-pay

## 2021-10-14 ENCOUNTER — Inpatient Hospital Stay (HOSPITAL_COMMUNITY)
Admission: EM | Admit: 2021-10-14 | Discharge: 2021-10-21 | DRG: 918 | Discharge status: Against Medical Advice | Payer: No Typology Code available for payment source | Attending: Family Medicine | Admitting: Family Medicine

## 2021-10-14 DIAGNOSIS — Z5900 Homelessness unspecified: Secondary | ICD-10-CM

## 2021-10-14 DIAGNOSIS — F411 Generalized anxiety disorder: Secondary | ICD-10-CM | POA: Diagnosis present

## 2021-10-14 DIAGNOSIS — R748 Abnormal levels of other serum enzymes: Secondary | ICD-10-CM

## 2021-10-14 DIAGNOSIS — Z888 Allergy status to other drugs, medicaments and biological substances status: Secondary | ICD-10-CM

## 2021-10-14 DIAGNOSIS — R9431 Abnormal electrocardiogram [ECG] [EKG]: Secondary | ICD-10-CM | POA: Diagnosis present

## 2021-10-14 DIAGNOSIS — F1210 Cannabis abuse, uncomplicated: Secondary | ICD-10-CM | POA: Diagnosis present

## 2021-10-14 DIAGNOSIS — K716 Toxic liver disease with hepatitis, not elsewhere classified: Secondary | ICD-10-CM | POA: Diagnosis present

## 2021-10-14 DIAGNOSIS — F419 Anxiety disorder, unspecified: Secondary | ICD-10-CM | POA: Diagnosis present

## 2021-10-14 DIAGNOSIS — F200 Paranoid schizophrenia: Secondary | ICD-10-CM | POA: Diagnosis present

## 2021-10-14 DIAGNOSIS — I498 Other specified cardiac arrhythmias: Secondary | ICD-10-CM | POA: Diagnosis present

## 2021-10-14 DIAGNOSIS — K712 Toxic liver disease with acute hepatitis: Secondary | ICD-10-CM

## 2021-10-14 DIAGNOSIS — Z781 Physical restraint status: Secondary | ICD-10-CM

## 2021-10-14 DIAGNOSIS — Z79899 Other long term (current) drug therapy: Secondary | ICD-10-CM

## 2021-10-14 DIAGNOSIS — M79605 Pain in left leg: Secondary | ICD-10-CM | POA: Diagnosis present

## 2021-10-14 DIAGNOSIS — T6591XA Toxic effect of unspecified substance, accidental (unintentional), initial encounter: Secondary | ICD-10-CM

## 2021-10-14 DIAGNOSIS — E876 Hypokalemia: Secondary | ICD-10-CM | POA: Diagnosis present

## 2021-10-14 DIAGNOSIS — I10 Essential (primary) hypertension: Secondary | ICD-10-CM | POA: Diagnosis present

## 2021-10-14 DIAGNOSIS — F17210 Nicotine dependence, cigarettes, uncomplicated: Secondary | ICD-10-CM | POA: Diagnosis present

## 2021-10-14 DIAGNOSIS — B1920 Unspecified viral hepatitis C without hepatic coma: Secondary | ICD-10-CM | POA: Diagnosis present

## 2021-10-14 DIAGNOSIS — R7401 Elevation of levels of liver transaminase levels: Secondary | ICD-10-CM | POA: Diagnosis present

## 2021-10-14 DIAGNOSIS — T5491XA Toxic effect of unspecified corrosive substance, accidental (unintentional), initial encounter: Principal | ICD-10-CM | POA: Diagnosis present

## 2021-10-14 DIAGNOSIS — G8929 Other chronic pain: Secondary | ICD-10-CM | POA: Diagnosis present

## 2021-10-14 DIAGNOSIS — F209 Schizophrenia, unspecified: Secondary | ICD-10-CM | POA: Diagnosis present

## 2021-10-14 DIAGNOSIS — M79604 Pain in right leg: Secondary | ICD-10-CM | POA: Diagnosis present

## 2021-10-14 DIAGNOSIS — R000 Tachycardia, unspecified: Secondary | ICD-10-CM | POA: Diagnosis present

## 2021-10-14 DIAGNOSIS — F1510 Other stimulant abuse, uncomplicated: Secondary | ICD-10-CM | POA: Diagnosis present

## 2021-10-14 DIAGNOSIS — Z818 Family history of other mental and behavioral disorders: Secondary | ICD-10-CM

## 2021-10-14 DIAGNOSIS — F1120 Opioid dependence, uncomplicated: Secondary | ICD-10-CM | POA: Diagnosis present

## 2021-10-14 LAB — CBC
HCT: 40.5 % (ref 39.0–52.0)
Hemoglobin: 12.8 g/dL — ABNORMAL LOW (ref 13.0–17.0)
MCH: 27.4 pg (ref 26.0–34.0)
MCHC: 31.6 g/dL (ref 30.0–36.0)
MCV: 86.7 fL (ref 80.0–100.0)
Platelets: 282 10*3/uL (ref 150–400)
RBC: 4.67 MIL/uL (ref 4.22–5.81)
RDW: 13.9 % (ref 11.5–15.5)
WBC: 6.2 10*3/uL (ref 4.0–10.5)
nRBC: 0 % (ref 0.0–0.2)

## 2021-10-14 LAB — COMPREHENSIVE METABOLIC PANEL
ALT: 690 U/L — ABNORMAL HIGH (ref 0–44)
AST: 345 U/L — ABNORMAL HIGH (ref 15–41)
Albumin: 3.6 g/dL (ref 3.5–5.0)
Alkaline Phosphatase: 219 U/L — ABNORMAL HIGH (ref 38–126)
Anion gap: 11 (ref 5–15)
BUN: 9 mg/dL (ref 6–20)
CO2: 26 mmol/L (ref 22–32)
Calcium: 9.1 mg/dL (ref 8.9–10.3)
Chloride: 105 mmol/L (ref 98–111)
Creatinine, Ser: 0.89 mg/dL (ref 0.61–1.24)
GFR, Estimated: >60 mL/min (ref >60)
Glucose, Bld: 121 mg/dL — ABNORMAL HIGH (ref 70–99)
Potassium: 3.2 mmol/L — ABNORMAL LOW (ref 3.5–5.1)
Sodium: 142 mmol/L (ref 135–145)
Total Bilirubin: 1.2 mg/dL (ref 0.3–1.2)
Total Protein: 6.7 g/dL (ref 6.5–8.1)

## 2021-10-14 LAB — LIPASE, BLOOD: Lipase: 29 U/L (ref 11–51)

## 2021-10-14 LAB — ACETAMINOPHEN LEVEL: Acetaminophen (Tylenol), Serum: <10 ug/mL — ABNORMAL LOW (ref 10–30)

## 2021-10-14 LAB — ETHANOL: Alcohol, Ethyl (B): <10 mg/dL (ref <10)

## 2021-10-14 LAB — SALICYLATE LEVEL: Salicylate Lvl: <7 mg/dL — ABNORMAL LOW (ref 7.0–30.0)

## 2021-10-14 NOTE — ED Provider Notes (Signed)
[facility] EMERGENCY DEPARTMENT Provider Note   CSN: [id number] Arrival date & time: 10/14/21  2055     History  Chief Complaint  Patient presents with   Ingestion    Pt states "I took a swig of bleach to clean out my insides" denies SI/HI   Chest Pain    Challen Spainhour is a 26 y.o. male with past medical history significant for schizophrenia, substance abuse, PTSD, IV drug use, homelessness, previous malingering previous suicidal ideation who presents with concern for abdominal pain after wanting to clean his insides out with bleach.  Patient reports that he took 1 swig.  Patient reports that he did not have any suicidal ideation, homicidal ideation.  He denies any active hallucinations at this time.  When I asked patient why he drink bleach knowing that it is toxic he reports "I did not know it was toxic".   Ingestion Associated symptoms include abdominal pain.  Chest Pain Associated symptoms: abdominal pain        Home Medications Prior to Admission medications   Medication Sig Start Date End Date Taking? Authorizing Provider  buprenorphine-naloxone (SUBOXONE) 8-2 mg SUBL SL tablet Place 1 tablet under the tongue in the morning and at bedtime.    [provider]  BUSPIRONE HCL PO Take 1 tablet by mouth daily.    [provider]  clonazePAM (KLONOPIN) 1 MG tablet Take 1 mg by mouth daily.    [provider]  gabapentin (NEURONTIN) 100 MG capsule Take 1 capsule (100 mg total) by mouth 3 (three) times daily. Patient not taking: Reported on 09/23/2021 09/12/21   Liborio Nixon L, NP  gabapentin (NEURONTIN) 300 MG capsule Take 300 mg by mouth daily.    [provider]  OLANZapine (ZYPREXA) 10 MG tablet Take 10 mg by mouth daily.    [provider]  OLANZapine (ZYPREXA) 5 MG tablet Take 1 tablet (5 mg total) by mouth 2 (two) times daily. Patient not taking: Reported on 09/23/2021 09/12/21   Layla Barter, NP  sertraline  (ZOLOFT) 50 MG tablet Take 50 mg by mouth daily.    [provider]      Allergies    Haldol [haloperidol]    Review of Systems   Review of Systems  Gastrointestinal:  Positive for abdominal pain.  All other systems reviewed and are negative.   Physical Exam Updated Vital Signs BP (!) 144/94   Pulse 62   Temp 98.6 F (37 C) (Oral)   Resp 16   Ht 6\' 1"  (1.854 m)   Wt 81.6 kg   SpO2 99%   BMI 23.73 kg/m  Physical Exam Vitals and nursing note reviewed.  Constitutional:      General: He is not in acute distress.    Appearance: Normal appearance.  HENT:     Head: Normocephalic and atraumatic.  Eyes:     General:        Right eye: No discharge.        Left eye: No discharge.  Cardiovascular:     Rate and Rhythm: Normal rate and regular rhythm.     Heart sounds: No murmur heard.    No friction rub. No gallop.  Pulmonary:     Effort: Pulmonary effort is normal.     Breath sounds: Normal breath sounds.  Abdominal:     General: Bowel sounds are normal.     Palpations: Abdomen is soft.  Skin:    General: Skin is warm  and dry.     Capillary Refill: Capillary refill takes less than 2 seconds.  Neurological:     Mental Status: He is alert and oriented to person, place, and time.  Psychiatric:     Comments: Very quiet, mumbled speech, but patient answers questions.  He does not seem to be responding to internal stimuli.  He does have a very odd affect, and somewhat inappropriate answers to questions insofar as he is not where the bleach is toxic, he was not drinking bleach in an attempt to commit suicide.     ED Results / Procedures / Treatments   Labs (all labs ordered are listed, but only abnormal results are displayed) Labs Reviewed  CBC - Abnormal; Notable for the following components:      Result Value   Hemoglobin 12.8 (*)    All other components within normal limits  COMPREHENSIVE METABOLIC PANEL  ETHANOL  SALICYLATE LEVEL  ACETAMINOPHEN LEVEL   RAPID URINE DRUG SCREEN, HOSP PERFORMED  LIPASE, BLOOD  CBG MONITORING, ED    EKG None  Radiology No results found.  Procedures Procedures    Medications Ordered in ED Medications - No data to display  ED Course/ Medical Decision Making/ A&P Clinical Course as of 10/14/21 2338  Tue Oct 14, 2021  2326 Per poison control -- 1-2 Hours NPO. PO challenge thereafter. If can't tolerate PO, will need GI consult, endoscopy. [CP]    Clinical Course User Index [CP] Olene Floss, PA-C                           Medical Decision Making Amount and/or Complexity of Data Reviewed Labs: ordered.   This patient is a 26 y.o. male who presents to the ED for concern of toxic ingestion of bleach, this involves an extensive number of treatment options, and is a complaint that carries with it a high risk of complications and morbidity. The emergent differential diagnosis prior to evaluation includes, but is not limited to, attempted suicide, active hallucination, acute abdominal infection, or stomach ulcers related to toxic ingestion, other toxic ingestions versus other.   This is not an exhaustive differential.   Past Medical History / Co-morbidities / Social History: schizophrenia, substance abuse, PTSD, IV drug use, homelessness, previous malingering previous suicidal ideation   Additional history: Chart reviewed. Pertinent results include: Reviewed recent previous lab work and imaging from emergency department visits as well as behavioral health urgent care notes from previous evaluations.  Physical Exam: Physical exam performed. The pertinent findings include: Patient with odd affect, some inappropriate responses to questions but does not seem to have any active SI, HI, AVH.  Patient with deep misunderstanding of toxic nature of bleach, reporting that he just wanted to "clean out his insides".  In context of unsafe behavior, previous SI, history of schizophrenia I think the patient  would benefit from a psychiatric evaluation as well as medical clearance to make sure that he does not have any adverse health effects related to his presumed toxic ingestion.  He is in no acute distress at this time, no significant tenderness to palpation of the abdomen.  Lab work is pending at this time.  TTS is consulted.  11:38 PM Care of Govind Furey transferred to [facility] and Dr. Bebe Shaggy at the end of my shift as the patient will require reassessment once labs/imaging have resulted. Patient presentation, ED course, and plan of care discussed with review of all pertinent  labs and imaging. Please see his/her note for further details regarding further ED course and disposition. Plan at time of handoff is pending lab work, with scheduled follow-up, and p.o challenge. This may be altered or completely changed at the discretion of the oncoming team pending results of further workup.  Final Clinical Impression(s) / ED Diagnoses Final diagnoses:  None    Rx / DC Orders ED Discharge Orders     None         [place] 10/14/21 2338    Linwood Dibbles, MD 10/15/21 1114

## 2021-10-14 NOTE — ED Triage Notes (Signed)
Reports wanting to "clean out his insides" and decided to do so by "taking a swig of bleach" denies SI/HI

## 2021-10-15 ENCOUNTER — Emergency Department (HOSPITAL_COMMUNITY): Payer: No Typology Code available for payment source

## 2021-10-15 ENCOUNTER — Inpatient Hospital Stay (HOSPITAL_COMMUNITY): Payer: No Typology Code available for payment source

## 2021-10-15 ENCOUNTER — Encounter (HOSPITAL_COMMUNITY): Payer: Self-pay | Admitting: Internal Medicine

## 2021-10-15 DIAGNOSIS — Z818 Family history of other mental and behavioral disorders: Secondary | ICD-10-CM | POA: Diagnosis not present

## 2021-10-15 DIAGNOSIS — F1120 Opioid dependence, uncomplicated: Secondary | ICD-10-CM | POA: Diagnosis present

## 2021-10-15 DIAGNOSIS — E876 Hypokalemia: Secondary | ICD-10-CM

## 2021-10-15 DIAGNOSIS — F17210 Nicotine dependence, cigarettes, uncomplicated: Secondary | ICD-10-CM | POA: Diagnosis present

## 2021-10-15 DIAGNOSIS — F1510 Other stimulant abuse, uncomplicated: Secondary | ICD-10-CM | POA: Diagnosis present

## 2021-10-15 DIAGNOSIS — B1920 Unspecified viral hepatitis C without hepatic coma: Secondary | ICD-10-CM | POA: Diagnosis present

## 2021-10-15 DIAGNOSIS — M79605 Pain in left leg: Secondary | ICD-10-CM | POA: Diagnosis present

## 2021-10-15 DIAGNOSIS — R9431 Abnormal electrocardiogram [ECG] [EKG]: Secondary | ICD-10-CM | POA: Diagnosis present

## 2021-10-15 DIAGNOSIS — G8929 Other chronic pain: Secondary | ICD-10-CM | POA: Diagnosis present

## 2021-10-15 DIAGNOSIS — Z888 Allergy status to other drugs, medicaments and biological substances status: Secondary | ICD-10-CM | POA: Diagnosis not present

## 2021-10-15 DIAGNOSIS — R000 Tachycardia, unspecified: Secondary | ICD-10-CM | POA: Diagnosis present

## 2021-10-15 DIAGNOSIS — R7401 Elevation of levels of liver transaminase levels: Secondary | ICD-10-CM

## 2021-10-15 DIAGNOSIS — K716 Toxic liver disease with hepatitis, not elsewhere classified: Secondary | ICD-10-CM | POA: Diagnosis present

## 2021-10-15 DIAGNOSIS — K712 Toxic liver disease with acute hepatitis: Secondary | ICD-10-CM | POA: Diagnosis not present

## 2021-10-15 DIAGNOSIS — F200 Paranoid schizophrenia: Secondary | ICD-10-CM | POA: Diagnosis present

## 2021-10-15 DIAGNOSIS — F1210 Cannabis abuse, uncomplicated: Secondary | ICD-10-CM | POA: Diagnosis present

## 2021-10-15 DIAGNOSIS — R748 Abnormal levels of other serum enzymes: Secondary | ICD-10-CM | POA: Diagnosis not present

## 2021-10-15 DIAGNOSIS — Z781 Physical restraint status: Secondary | ICD-10-CM | POA: Diagnosis not present

## 2021-10-15 DIAGNOSIS — I10 Essential (primary) hypertension: Secondary | ICD-10-CM | POA: Diagnosis present

## 2021-10-15 DIAGNOSIS — Z79899 Other long term (current) drug therapy: Secondary | ICD-10-CM | POA: Diagnosis not present

## 2021-10-15 DIAGNOSIS — I498 Other specified cardiac arrhythmias: Secondary | ICD-10-CM

## 2021-10-15 DIAGNOSIS — T5491XA Toxic effect of unspecified corrosive substance, accidental (unintentional), initial encounter: Principal | ICD-10-CM

## 2021-10-15 DIAGNOSIS — F419 Anxiety disorder, unspecified: Secondary | ICD-10-CM | POA: Diagnosis not present

## 2021-10-15 DIAGNOSIS — T6591XA Toxic effect of unspecified substance, accidental (unintentional), initial encounter: Secondary | ICD-10-CM | POA: Diagnosis not present

## 2021-10-15 DIAGNOSIS — Z5900 Homelessness unspecified: Secondary | ICD-10-CM | POA: Diagnosis not present

## 2021-10-15 DIAGNOSIS — F411 Generalized anxiety disorder: Secondary | ICD-10-CM | POA: Diagnosis present

## 2021-10-15 DIAGNOSIS — M79604 Pain in right leg: Secondary | ICD-10-CM | POA: Diagnosis present

## 2021-10-15 LAB — CBC WITH DIFFERENTIAL/PLATELET
Abs Immature Granulocytes: 0.01 10*3/uL (ref 0.00–0.07)
Basophils Absolute: 0.1 10*3/uL (ref 0.0–0.1)
Basophils Relative: 1 %
Eosinophils Absolute: 0.1 10*3/uL (ref 0.0–0.5)
Eosinophils Relative: 1 %
HCT: 44.9 % (ref 39.0–52.0)
Hemoglobin: 14.4 g/dL (ref 13.0–17.0)
Immature Granulocytes: 0 %
Lymphocytes Relative: 37 %
Lymphs Abs: 2.1 10*3/uL (ref 0.7–4.0)
MCH: 27.6 pg (ref 26.0–34.0)
MCHC: 32.1 g/dL (ref 30.0–36.0)
MCV: 86 fL (ref 80.0–100.0)
Monocytes Absolute: 0.8 10*3/uL (ref 0.1–1.0)
Monocytes Relative: 13 %
Neutro Abs: 2.7 10*3/uL (ref 1.7–7.7)
Neutrophils Relative %: 48 %
Platelets: 291 10*3/uL (ref 150–400)
RBC: 5.22 MIL/uL (ref 4.22–5.81)
RDW: 13.8 % (ref 11.5–15.5)
WBC: 5.7 10*3/uL (ref 4.0–10.5)
nRBC: 0 % (ref 0.0–0.2)

## 2021-10-15 LAB — CK: Total CK: 232 U/L (ref 49–397)

## 2021-10-15 LAB — RAPID URINE DRUG SCREEN, HOSP PERFORMED
Amphetamines: POSITIVE — AB
Barbiturates: NOT DETECTED
Benzodiazepines: NOT DETECTED
Cocaine: NOT DETECTED
Opiates: POSITIVE — AB
Tetrahydrocannabinol: POSITIVE — AB

## 2021-10-15 LAB — COMPREHENSIVE METABOLIC PANEL
ALT: 664 U/L — ABNORMAL HIGH (ref 0–44)
AST: 291 U/L — ABNORMAL HIGH (ref 15–41)
Albumin: 3.2 g/dL — ABNORMAL LOW (ref 3.5–5.0)
Alkaline Phosphatase: 187 U/L — ABNORMAL HIGH (ref 38–126)
Anion gap: 14 (ref 5–15)
BUN: 7 mg/dL (ref 6–20)
CO2: 25 mmol/L (ref 22–32)
Calcium: 8.9 mg/dL (ref 8.9–10.3)
Chloride: 101 mmol/L (ref 98–111)
Creatinine, Ser: 0.78 mg/dL (ref 0.61–1.24)
GFR, Estimated: >60 mL/min (ref >60)
Glucose, Bld: 140 mg/dL — ABNORMAL HIGH (ref 70–99)
Potassium: 3.6 mmol/L (ref 3.5–5.1)
Sodium: 140 mmol/L (ref 135–145)
Total Bilirubin: 1 mg/dL (ref 0.3–1.2)
Total Protein: 6.3 g/dL — ABNORMAL LOW (ref 6.5–8.1)

## 2021-10-15 LAB — LACTIC ACID, PLASMA
Lactic Acid, Venous: 0.9 mmol/L (ref 0.5–1.9)
Lactic Acid, Venous: 2.1 mmol/L (ref 0.5–1.9)

## 2021-10-15 LAB — PROTIME-INR
INR: 1.1 (ref 0.8–1.2)
Prothrombin Time: 14.4 seconds (ref 11.4–15.2)

## 2021-10-15 LAB — HEPATITIS PANEL, ACUTE
HCV Ab: REACTIVE — AB
Hep A IgM: NONREACTIVE
Hep B C IgM: NONREACTIVE
Hepatitis B Surface Ag: NONREACTIVE

## 2021-10-15 LAB — CBG MONITORING, ED: Glucose-Capillary: 110 mg/dL — ABNORMAL HIGH (ref 70–99)

## 2021-10-15 LAB — MAGNESIUM
Magnesium: 1.8 mg/dL (ref 1.7–2.4)
Magnesium: 1.7 mg/dL (ref 1.7–2.4)

## 2021-10-15 LAB — LACTATE DEHYDROGENASE: LDH: 279 U/L — ABNORMAL HIGH (ref 98–192)

## 2021-10-15 MED ORDER — NALOXONE HCL 0.4 MG/ML IJ SOLN: 0.4000 mg | INTRAMUSCULAR | Status: DC | PRN

## 2021-10-15 MED ORDER — DEXTROSE 5 % IV SOLN
12.5000 mg/kg/h | INTRAVENOUS | Status: DC
  Administered 2021-10-15: 12.5 mg/kg/h via INTRAVENOUS
  Filled 2021-10-15: qty 90

## 2021-10-15 MED ORDER — FENTANYL CITRATE PF 50 MCG/ML IJ SOSY
50.0000 ug | PREFILLED_SYRINGE | INTRAMUSCULAR | Status: DC | PRN
  Administered 2021-10-18 (×2): 50 ug via INTRAVENOUS
  Filled 2021-10-15 (×2): qty 1

## 2021-10-15 MED ORDER — POTASSIUM CHLORIDE 10 MEQ/100ML IV SOLN
10.0000 meq | INTRAVENOUS | Status: AC
  Administered 2021-10-15 (×2): 10 meq via INTRAVENOUS
  Filled 2021-10-15 (×2): qty 100

## 2021-10-15 MED ORDER — ONDANSETRON HCL 4 MG/2ML IJ SOLN: 4.0000 mg | Freq: Four times a day (QID) | INTRAMUSCULAR | Status: DC | PRN

## 2021-10-15 MED ORDER — ACETYLCYSTEINE LOAD VIA INFUSION
150.0000 mg/kg | Freq: Once | INTRAVENOUS | Status: AC
  Administered 2021-10-15: 12240 mg via INTRAVENOUS
  Filled 2021-10-15: qty 402

## 2021-10-15 MED ORDER — LORAZEPAM 2 MG/ML IJ SOLN
2.0000 mg | Freq: Once | INTRAMUSCULAR | Status: AC
Start: 2021-10-15 — End: 2021-10-15
  Administered 2021-10-15: 2 mg via INTRAMUSCULAR
  Filled 2021-10-15: qty 1

## 2021-10-15 MED ORDER — BUSPIRONE HCL 5 MG PO TABS
5.0000 mg | ORAL_TABLET | Freq: Every day | ORAL | Status: DC
  Administered 2021-10-15 – 2021-10-21 (×7): 5 mg via ORAL
  Filled 2021-10-15 (×8): qty 1

## 2021-10-15 MED ORDER — OLANZAPINE 5 MG PO TABS
10.0000 mg | ORAL_TABLET | Freq: Every day | ORAL | Status: DC
  Administered 2021-10-15 – 2021-10-21 (×7): 10 mg via ORAL
  Filled 2021-10-15 (×4): qty 2
  Filled 2021-10-15: qty 1
  Filled 2021-10-15 (×3): qty 2

## 2021-10-15 MED ORDER — SODIUM CHLORIDE 0.9 % IV SOLN: INTRAVENOUS | Status: DC

## 2021-10-15 MED ORDER — GABAPENTIN 300 MG PO CAPS
300.0000 mg | ORAL_CAPSULE | Freq: Every day | ORAL | Status: DC
  Administered 2021-10-15 – 2021-10-21 (×7): 300 mg via ORAL
  Filled 2021-10-15 (×8): qty 1

## 2021-10-15 MED ORDER — ACETAMINOPHEN 650 MG RE SUPP: 650.0000 mg | Freq: Four times a day (QID) | RECTAL | Status: DC | PRN

## 2021-10-15 MED ORDER — SODIUM CHLORIDE 0.9 % IV BOLUS
1000.0000 mL | Freq: Once | INTRAVENOUS | Status: AC
  Administered 2021-10-15: 1000 mL via INTRAVENOUS

## 2021-10-15 MED ORDER — SERTRALINE HCL 50 MG PO TABS
50.0000 mg | ORAL_TABLET | Freq: Every day | ORAL | Status: DC
  Administered 2021-10-15 – 2021-10-21 (×7): 50 mg via ORAL
  Filled 2021-10-15 (×8): qty 1

## 2021-10-15 MED ORDER — ACETAMINOPHEN 325 MG PO TABS
650.0000 mg | ORAL_TABLET | Freq: Four times a day (QID) | ORAL | Status: DC | PRN
Start: 2021-10-15 — End: 2021-10-17
  Administered 2021-10-17: 650 mg via ORAL
  Filled 2021-10-15: qty 2

## 2021-10-15 NOTE — Assessment & Plan Note (Addendum)
 #)   Bleach ingestion: Bleach ingestion occurring on the evening of 10/14/2021, as further detailed above, with ensuing development mild epigastric discomfort, that is not progressed in terms of intensity, with physical exam revealing no evidence of acute peritoneal signs.  This occurred in the absence of any suicidal ideations, but rather in the context of a history of paranoid schizophrenia.   EDP discussed the patient's case with poison control, who, in the context of patient's bleach ingestion as well as acute transaminitis, recommended initiation of N-acetylcysteine, followed by repeat CMP to be checked 22 hours following initiation of N-acetylcysteine.  In the setting of nonworsening abdominal discomfort, poison control conveyed that there is not an overt indication for pursuit of EGD evaluation at this time.  While patient continues to complain of mild epigastric discomfort, it does not appear that there has been progression in the associated intensity or distribution of this discomfort.  Additionally, per my physical exam, no evidence of acute peritoneal findings at this time.  Of note, N-acetylcysteine was noted to be initiated at 0230.  Consequently, we will schedule recommendation for repeat CMP to occur 22 hours after NAC initiation, will coincide with CMP to be drawn at 0030 on 10/16/2021.   Plan: Please control consulted.  N-acetylcysteine initiated per the recommendation.  Repeat CMP ordered for 0030 on 10/16/2021 per recommendation of poison control.  Further evaluation management of acute transaminitis, as above.  Repeat CMP in the morning.  Continuous IV fluids.  May benefit from a psychiatry consultation for optimization of outpatient psychiatric regimen, as it appears that the patient's bleach ingestion occurred in the context of schizophrenia, without any intent to harm himself.  Clear liquids.  Prn IV fentanyl.  Prn IV Zofran.

## 2021-10-15 NOTE — ED Provider Notes (Addendum)
Physical Exam  BP (!) 141/84   Pulse (!) 37   Temp 98.6 F (37 C) (Oral)   Resp 11   Ht 6\' 1"  (1.854 m)   Wt 81.6 kg   SpO2 98%   BMI 23.73 kg/m   Physical Exam Vitals and nursing note reviewed.  Constitutional:      Appearance: He is not toxic-appearing.  HENT:     Head: Normocephalic and atraumatic.     Mouth/Throat:     Mouth: Mucous membranes are moist.     Pharynx: No oropharyngeal exudate or posterior oropharyngeal erythema.  Eyes:     General:        Right eye: No discharge.        Left eye: No discharge.     Extraocular Movements: Extraocular movements intact.     Conjunctiva/sclera: Conjunctivae normal.     Pupils: Pupils are equal, round, and reactive to light.  Cardiovascular:     Rate and Rhythm: Normal rate and regular rhythm.     Pulses: Normal pulses.     Heart sounds: Normal heart sounds. No murmur heard. Pulmonary:     Effort: Pulmonary effort is normal. No respiratory distress.     Breath sounds: Normal breath sounds. No wheezing or rales.  Abdominal:     General: Bowel sounds are normal. There is no distension.     Palpations: Abdomen is soft.     Tenderness: There is no abdominal tenderness.  Musculoskeletal:        General: No deformity.     Cervical back: Neck supple.     Right lower leg: No edema.     Left lower leg: No edema.  Skin:    General: Skin is warm and dry.  Neurological:     Mental Status: He is alert. Mental status is at baseline.  Psychiatric:        Mood and Affect: Mood normal.     Procedures  .Critical Care  Performed by: , PA-C Authorized by: Paris Lore, PA-C   Critical care provider statement:    Critical care time (minutes):  45   Critical care was time spent personally by me on the following activities:  Development of treatment plan with patient or surrogate, discussions with consultants, evaluation of patient's response to treatment, examination of patient, obtaining history from  patient or surrogate, ordering and performing treatments and interventions, ordering and review of laboratory studies, ordering and review of radiographic studies, pulse oximetry and re-evaluation of patient's condition   ED Course / MDM   Clinical Course as of 10/15/21 0728  Tue Oct 14, 2021  2326 Per poison control -- 1-2 Hours NPO. PO challenge thereafter. If can't tolerate PO, will need GI consult, endoscopy. [CP]  Wed Oct 15, 2021  0134 Case discussed with poison control RN 0135, now with laboratory studies available.  Given significant elevation in LFTs from baseline poison control RN and pharmacist are recommending proceeding with N-acetylcysteine 150 mg/kg bolus, then 12.5 mg/kg/hr infusion thereafter.Repeat CMP at 22 h post onset of NAC. NO EGD recommended by poison control right now, but low threshold if symptoms progress. Per poison control, EKG changes likely not related to reported bleach ingestion. [RS]  0218 Consults to cardiology fellow Dr. 0219, who states that patient's EKG is consistent with wandering atrial pacemaker with nonconducted PACs.  He states this is a benign rhythm that will not require any intervention.  I appreciate his collaboration in the care of this  patient. [RS]  [phone] Consult to Dr. Arlean Hopping, hospitalist, who is agreeable to admitting this patient to his service. I appreciate his collaboration in the care of this patient.  [RS]  9622 Informed by ED RN at time of shift change that patient had attempted to flee the emergency department and had to be physically restrained by security to keep him in the emergency department.  Patient with EKG changes today therefore hesitant to proceed with Geodon, IM Ativan ordered as patient has pulled out his IV. [RS]    Clinical Course User Index [CP] Prosperi, Harrel Carina, PA-C [RS] Sahar Ryback, Eugene Gavia, PA-C   Medical Decision Making Amount and/or Complexity of Data Reviewed Labs: ordered. Radiology:  ordered.  Risk Decision regarding hospitalization.   Care of this patient assumed from preceding ED provider Christian prosperity, PA-C at time of shift change.  Please see his associated note for further insight of the patient's ED course.  In brief patient is a 26 year old male with history of schizophrenia, IV drug use, and homelessness who presents for evaluation of upper abdominal pain after ingestion of bleach today.  Patient states that he wanted to "clean out his insides", and took "just 1 swallow".  Patient endorses use of methamphetamine, heroin, marijuana.  States he has been homeless for 2 months.  Also endorses Suboxone use.  Has multiple behavioral health medications prescribed but it is unclear if he is taking any of these medications.  At time of my evaluation he is endorsing epigastric pain but is otherwise very somnolent with flat affect and mumbling speech.  He is arousable to voice.  No vomiting and has tolerated a cup of water in the emergency department without worsening of his epigastric pain.  Normal phonation, no oropharyngeal changes.  Poison control consultation as above.  Given changes in his liver labs findings Poison control recommending NAC.  This has been ordered.  Pending cardiology consultation for EKG changes that are new today for this patient.  Consult to cardiology as above, no intervention recommended by cardiology at this time. Patient will require admission to the hospital for further NAC dosage and stabilization.   Consult to hospitalist as above; patient remains hemodynamically stable, without acute concern. Continues to state he has mild epigastric pain; somnolent but easily arousable to voice.   This chart was dictated using voice recognition software, Dragon. Despite the best efforts of this provider to proofread and correct errors, errors may still occur which can change documentation meaning.     Paris Lore, PA-C 10/15/21 0332     Paris Lore, PA-C 10/15/21 2979    Zadie Rhine, MD 10/15/21 [phone]

## 2021-10-15 NOTE — ED Notes (Signed)
MD made aware of high heart rate

## 2021-10-15 NOTE — Assessment & Plan Note (Signed)
 #)   Acute transaminitis: Presenting liver enzymes demonstrate relative elevation compared to most recent prior set drawn on 10/08/2021, without overtly cholestatic pattern, given normal total bilirubin and only mildly elevated alkaline phosphatase.  Roughly 2-1 ALT 8 AST predominance noted.  Acute viral hepatitis panel ordered, with result currently pending.  In the context of the patient's documented history of polysubstance abuse, including that of heroin and meth, will also follow for slow urinary drug screen.  Serum ethanol level less than 10, will serum acetaminophen and salicylate levels also nonelevated.   Plan: Further evaluation management presenting leakage ingestion, including recommendations per poison control, including N-acetylcysteine with repeat CMP ordered at 0030 on 10/16/2021 further recommendations.  Also repeat CMP in the morning.  Continuous IV fluids.  Check INR.  Check LDH.  Follow-up result of the acute viral hepatitis panel as well as urinary drug screen.  Consider dedicated abdominal imaging, patient potentially more compliant with ultrasound given his paranoid schizophrenia.

## 2021-10-15 NOTE — Progress Notes (Signed)
Pt arrived to unit, assessed, informed of POC, all needs addressed, sitter at bedside

## 2021-10-15 NOTE — Assessment & Plan Note (Signed)
  #)   Hypokalemia: Presenting serum potassium level 3.2.  Plan: Potassium chloride 40 mEq IV over 4 hours x 1 dose now.  Add on serum magnesium level.  Monitor on symmetry.

## 2021-10-15 NOTE — ED Provider Notes (Signed)
Patient attempted to leave, and when he was apprehended by police he fell on the ground.  He reports now that he hurts all over.  There is no signs of any head or spinal injury.  He does have diffuse tenderness to his chest has an abrasion to his left scapula..  No signs of any extremity injuries.  Will obtain chest x-ray.  Otherwise patient can be admitted.  He is now under involuntary commit   Zadie Rhine, MD 10/15/21 [phone]

## 2021-10-15 NOTE — Progress Notes (Signed)
  PROGRESS NOTE  Patient admitted earlier this morning. See H&P.   Patient with past medical history significant for paranoid schizophrenia, polysubstance abuse, generalized anxiety disorder who was admitted after intentional bleach ingestion.  In the ED, poison control was contacted who recommended initiation of N-acetylcysteine due to context of patient's bleach ingestion as well as acute transaminitis.  This morning, patient attempted to leave the hospital and was subsequently IVC.  He required restraints, security call, IM Ativan.  On my examination, patient has bedside sitter, is sleeping after Ativan administration.  Remains in sinus tachycardia.  Per nursing, poison control has released patient from their observation.  N-acetylcysteine has been discontinued.    Home meds include BuSpar 5 mg daily, Neurontin 300 mg daily, Zyprexa 10 mg daily, Zoloft 50 mg daily.  Has been out of Suboxone and Klonopin for a couple of weeks.  Last refill was August 07, 2021.  Potassium being replaced.  Psychiatry consulted.  Remains with sitter at bedside for flight risk.    Status is: Inpatient Remains inpatient appropriate because: psych consult.    Noralee Stain, DO [facility] 10/15/2021, 10:26 AM  Available via Epic secure chat 7am-7pm After these hours, please refer to coverage provider listed on [email or web address]

## 2021-10-15 NOTE — H&P (Signed)
History and Physical    PLEASE NOTE THAT DRAGON DICTATION SOFTWARE WAS USED IN THE CONSTRUCTION OF THIS NOTE.   Cody Poole BTD:[id number] DOB: [birth year] DOA: 10/14/2021  [facility]: [facility]  Patient coming from: home   I have personally briefly reviewed patient's old medical records in [facility] Link  Chief Complaint: Abdominal pain  HPI: Cody Poole is a 26 y.o. male with medical history significant for paranoid schizophrenia, polysubstance abuse, GAD, who is admitted to [facility] on 10/14/2021 with acute bleach ingestion after presenting from home to [facility] ED complaining of abdominal pain.   The patient reports that he ingested a "swig" of bleach earlier on 10/14/2021 in order to "clear out his insides".  He conveys that this was done with therapeutic intent, denying any suicidal or homicidal ideations.  Denies ingestion of any additional household items over the last day, nor any recent recreational drug use.  Denies any concomitant alcohol consumption.  Following bleach ingestion, the patient reports development of mild nonradiating epigastric discomfort, which she describes as sharp in nature.  States that the pain has not been intensifying since onset.  Reports some associated nausea in the absence of any vomiting.  Denies any diarrhea, melena, hematochezia.  He also denies any associated chest pain, shortness of breath, palpitations, diaphoresis, dizziness, syncope, or syncope.  No recent dysuria, gross hematuria.  No recent subjective fever, chills, rigors, or generalized myalgias.   Per chart review, patient has documented history of paranoid schizophrenia as well as polysubstance abuse, with outpatient medication regimen that includes olanzapine, gabapentin, BuSpar, Zoloft.      ED Course:  Vital signs in the ED were notable for the following: Afebrile; heart rate 60-80 blood pressure 144/94; respiratory rate 16, oxygen saturation 98% on room air.  Labs  were notable for the following: CMP notable for the following: Potassium 3.2, bicarb 26, creatinine 0.89 compared to most recent prior value of 1.09 on 10/08/2021, alkaline phosphatase 219, AST 345, ALT 690, total bilirubin 1.2.  This is relative to most recent prior set of liver enzymes, which were drawn on 10/09/2018 and were notable for the following: Alkaline phosphatase 71, AST 79, ALT 92, total bilirubin 0.9.  CPK 232.  Lactic acid 0.9.  CBC notable for episode count 6200, hemoglobin 12.8.  Serum acetaminophen level less than 10.  Salicylate level less than 7.  Serum ethanol level less than 10.  Urinary drug screen acute viral hepatitis panel been ordered, with results currently pending.  Imaging and additional notable ED work-up: EKG, which EDP discussed with on-call cardiologist, demonstrates, per cardiology interpretation a wandering atrial pacemaker, with QTc notable for 428 MS. Per cardiology, this is a benign rhythm, that does not require additional evaluation at this time.  Chest x-ray shows no evidence of acute cardiopulmonary process.  EDP discussed the patient's case with poison control, who, in the context of patient's bleach ingestion as well as acute transaminitis, recommended initiation of N-acetylcysteine, followed by repeat CMP to be checked 22 hours following initiation of N-acetylcysteine.  In the setting of nonworsening abdominal discomfort, poison control conveyed that there is not an overt indication for pursuit of EGD evaluation at this time.   While in the ED, the following were administered: N-acetylcysteine was started at 0230 on 10/15/2021; also received a 1 L normal saline bolus.  Subsequently, the patient was admitted for further evaluation management presenting acute bleach ingestion associated with acute transaminitis, with presenting labs also notable for hypokalemia.  Review of Systems: As per HPI otherwise 10 point review of systems negative.   Past Medical History:   Diagnosis Date   Anxiety    Back pain    Depression    Hypertension    Paranoid schizophrenia (HCC)     History reviewed. No pertinent surgical history.  Social History:  reports that he has been smoking. He has never used smokeless tobacco. He reports current drug use. Drugs: IV and Cocaine. He reports that he does not drink alcohol.   Allergies  Allergen Reactions   Haldol [Haloperidol] Other (See Comments)    Tongue swelling  Dysarthria  Relieved with benadryl     Family History  Family history unknown: Yes    Family history reviewed and not pertinent    Prior to Admission medications   Medication Sig Start Date End Date Taking? Authorizing Provider  buprenorphine-naloxone (SUBOXONE) 8-2 mg SUBL SL tablet Place 1 tablet under the tongue in the morning and at bedtime.    [provider]  BUSPIRONE HCL PO Take 1 tablet by mouth daily.    [provider]  clonazePAM (KLONOPIN) 1 MG tablet Take 1 mg by mouth daily.    [provider]  gabapentin (NEURONTIN) 100 MG capsule Take 1 capsule (100 mg total) by mouth 3 (three) times daily. Patient not taking: Reported on 09/23/2021 09/12/21   Liborio Nixon L, NP  gabapentin (NEURONTIN) 300 MG capsule Take 300 mg by mouth daily.    [provider]  OLANZapine (ZYPREXA) 10 MG tablet Take 10 mg by mouth daily.    [provider]  OLANZapine (ZYPREXA) 5 MG tablet Take 1 tablet (5 mg total) by mouth 2 (two) times daily. Patient not taking: Reported on 09/23/2021 09/12/21   Layla Barter, NP  sertraline (ZOLOFT) 50 MG tablet Take 50 mg by mouth daily.    [provider]     Objective    Physical Exam: Vitals:   10/14/21 2112 10/15/21 0126 10/15/21 0130 10/15/21 0330  BP: (!) 144/94 (!) 144/88 (!) 141/84 140/88  Pulse: 62 79 (!) 37 (!) 40  Resp: 16 16 11 16   Temp: 98.6 F (37 C)     TempSrc: Oral     SpO2: 99% 99% 98% 99%  Weight: 81.6 kg     Height: 6\' 1"  (1.854 m)        General: appears to be stated age; alert, oriented Skin: warm, dry, no rash Head:  AT/[place] Mouth:  Oral mucosa membranes appear dry, normal dentition Neck: supple; trachea midline Heart:  RRR; did not appreciate any M/R/G Lungs: CTAB, did not appreciate any wheezes, rales, or rhonchi Abdomen: + BS; soft, ND, mild tenderness to palpation over the epigastrium, in the absence of any associated guarding, rigidity, or rebound tenderness. Vascular: 2+ pedal pulses b/l; 2+ radial pulses b/l Extremities: no peripheral edema, no muscle wasting Neuro: strength and sensation intact in upper and lower extremities b/l     Labs on Admission: I have personally reviewed following labs and imaging studies  CBC: Recent Labs  Lab 10/14/21 2303  WBC 6.2  HGB 12.8*  HCT 40.5  MCV 86.7  PLT 282   Basic Metabolic Panel: Recent Labs  Lab 10/14/21 2303 10/15/21 0142  NA 142  --   K 3.2*  --   CL 105  --   CO2 26  --   GLUCOSE 121*  --   BUN 9  --   CREATININE 0.89  --  CALCIUM 9.1  --   MG  --  1.8   GFR: Estimated Creatinine Clearance: 143.4 mL/min (by C-G formula based on SCr of 0.89 mg/dL). Liver Function Tests: Recent Labs  Lab 10/14/21 2303  AST 345*  ALT 690*  ALKPHOS 219*  BILITOT 1.2  PROT 6.7  ALBUMIN 3.6   Recent Labs  Lab 10/14/21 2303  LIPASE 29   No results for input(s): "AMMONIA" in the last 168 hours. Coagulation Profile: No results for input(s): "INR", "PROTIME" in the last 168 hours. Cardiac Enzymes: Recent Labs  Lab 10/15/21 0142  CKTOTAL 232   BNP (last 3 results) No results for input(s): "PROBNP" in the last 8760 hours. HbA1C: No results for input(s): "HGBA1C" in the last 72 hours. CBG: Recent Labs  Lab 10/15/21 0000  GLUCAP 110*   Lipid Profile: No results for input(s): "CHOL", "HDL", "LDLCALC", "TRIG", "CHOLHDL", "LDLDIRECT" in the last 72 hours. Thyroid Function Tests: No results for input(s): "TSH", "T4TOTAL", "FREET4", "T3FREE",  "THYROIDAB" in the last 72 hours. Anemia Panel: No results for input(s): "VITAMINB12", "FOLATE", "FERRITIN", "TIBC", "IRON", "RETICCTPCT" in the last 72 hours. Urine analysis:    Component Value Date/Time   COLORURINE YELLOW 05/10/2021 2318   APPEARANCEUR CLEAR 05/10/2021 2318   LABSPEC 1.020 05/10/2021 2318   PHURINE 7.0 05/10/2021 2318   GLUCOSEU NEGATIVE 05/10/2021 2318   HGBUR NEGATIVE 05/10/2021 2318   BILIRUBINUR NEGATIVE 05/10/2021 2318   KETONESUR NEGATIVE 05/10/2021 2318   PROTEINUR 100 (A) 05/10/2021 2318   NITRITE NEGATIVE 05/10/2021 2318   LEUKOCYTESUR NEGATIVE 05/10/2021 2318    Radiological Exams on Admission: DG Chest 2 View  Result Date: 10/15/2021 CLINICAL DATA:  Caustic ingestion. EXAM: CHEST - 2 VIEW COMPARISON:  May 15, 2021 FINDINGS: The heart size and mediastinal contours are within normal limits. Both lungs are clear. The visualized skeletal structures are unremarkable. IMPRESSION: No active cardiopulmonary disease. Electronically Signed   By: Aram Candela M.D.   On: 10/15/2021 02:06     EKG: Independently reviewed, with result as described above.    Assessment/Plan   Principal Problem:   Ingestion of bleach Active Problems:   Paranoid schizophrenia (HCC)   Transaminitis   Wandering atrial pacemaker   Hypokalemia   Anxiety     #) Bleach ingestion: Bleach ingestion occurring on the evening of 10/14/2021, as further detailed above, with ensuing development mild epigastric discomfort, that is not progressed in terms of intensity, with physical exam revealing no evidence of acute peritoneal signs.  This occurred in the absence of any suicidal ideations, but rather in the context of a history of paranoid schizophrenia.   EDP discussed the patient's case with poison control, who, in the context of patient's bleach ingestion as well as acute transaminitis, recommended initiation of N-acetylcysteine, followed by repeat CMP to be checked 22 hours  following initiation of N-acetylcysteine.  In the setting of nonworsening abdominal discomfort, poison control conveyed that there is not an overt indication for pursuit of EGD evaluation at this time.  While patient continues to complain of mild epigastric discomfort, it does not appear that there has been progression in the associated intensity or distribution of this discomfort.  Additionally, per my physical exam, no evidence of acute peritoneal findings at this time.  Of note, N-acetylcysteine was noted to be initiated at 0230.  Consequently, we will schedule recommendation for repeat CMP to occur 22 hours after NAC initiation, will coincide with CMP to be drawn at 0030 on 10/16/2021.   Plan: Please  control consulted.  N-acetylcysteine initiated per the recommendation.  Repeat CMP ordered for 0030 on 10/16/2021 per recommendation of poison control.  Further evaluation management of acute transaminitis, as above.  Repeat CMP in the morning.  Continuous IV fluids.  May benefit from a psychiatry consultation for optimization of outpatient psychiatric regimen, as it appears that the patient's bleach ingestion occurred in the context of schizophrenia, without any intent to harm himself.  Clear liquids.  Prn IV fentanyl.  Prn IV Zofran.         #) Acute transaminitis: Presenting liver enzymes demonstrate relative elevation compared to most recent prior set drawn on 10/08/2021, without overtly cholestatic pattern, given normal total bilirubin and only mildly elevated alkaline phosphatase.  Roughly 2-1 ALT 8 AST predominance noted.  Acute viral hepatitis panel ordered, with result currently pending.  In the context of the patient's documented history of polysubstance abuse, including that of heroin and meth, will also follow for slow urinary drug screen.  Serum ethanol level less than 10, will serum acetaminophen and salicylate levels also nonelevated.   Plan: Further evaluation management presenting leakage  ingestion, including recommendations per poison control, including N-acetylcysteine with repeat CMP ordered at 0030 on 10/16/2021 further recommendations.  Also repeat CMP in the morning.  Continuous IV fluids.  Check INR.  Check LDH.  Follow-up result of the acute viral hepatitis panel as well as urinary drug screen.  Consider dedicated abdominal imaging, patient potentially more compliant with ultrasound given his paranoid schizophrenia.        #) Wandering atrial pacemaker: Patient's EKG and telemetry reviewed with on-call cardiology, who conveyed the presence of wandering atrial pacemaker, which they conveyed is a benign rhythm, that does not require additional evaluation at this time.  Plan: We will continue to monitor on symmetry.         #) Hypokalemia: Presenting serum potassium level 3.2.  Plan: Potassium chloride 40 mEq IV over 4 hours x 1 dose now.  Add on serum magnesium level.  Monitor on symmetry.         #) Paranoid schizophrenia: Documented history of such, on olanzapine as well as gabapentin as an outpatient  Plan: May consider consulting psychiatry for assistance with optimization of outpatient I psychotic regimen in the setting of his presenting intentional bleach ingestion without any associated suicidal or homicidal ideations.  For now, continuing olanzapine and gabapentin.         #) Generalized anxiety disorder: Documented history of such, on Zoloft as well as BuSpar as an outpatient.  Of note, presenting EKG shows no evidence of QTc prolongation.  Plan: Continue home Zoloft and BuSpar.          DVT prophylaxis: SCD's   Code Status: Full code Family Communication: none Disposition Plan: Per Rounding Team Consults called: Poison control consulted, as further detailed above;  Admission status: Inpatient   PLEASE NOTE THAT DRAGON DICTATION SOFTWARE WAS USED IN THE CONSTRUCTION OF THIS NOTE.   Chaney Born Eliabeth Shoff DO [facility]  From 7PM - 7AM   10/15/2021, 4:36 AM

## 2021-10-15 NOTE — Consult Note (Signed)
Patient seen and attempted to assess.  Patient difficult to arouse and remain awake during introduction.  Patient appears to be sedated at this time, and minimally responsive to physical stimulation.  Patient has previous psychiatric history of paranoid schizophrenia, polysubstance abuse, malingering.  Psychiatry has been consulted due to reports of ingestion of bleach.  Chart review shows patient has presented x1 with similar complaints in January 2023.  Patient is currently under involuntary commitment at this time.  Will attempt to reassess tomorrow once patient is more responsive and able to participate in psychiatric evaluation. -Disposition is pending at this time.  Patient does appear to be altered, and there is a change from his baseline.  Will likely meet inpatient psychiatric criteria once evaluated.

## 2021-10-15 NOTE — Assessment & Plan Note (Signed)
  #)   Wandering atrial pacemaker: Patient's EKG and telemetry reviewed with on-call cardiology, who conveyed the presence of wandering atrial pacemaker, which they conveyed is a benign rhythm, that does not require additional evaluation at this time.  Plan: We will continue to monitor on symmetry.

## 2021-10-15 NOTE — ED Notes (Signed)
Poison control RN, Vikki Ports, called to verify medication adminstration times, and lab results. Poison control to followd up with 1st shift

## 2021-10-15 NOTE — Assessment & Plan Note (Signed)
 #)   Generalized anxiety disorder: Documented history of such, on Zoloft as well as BuSpar as an outpatient.  Of note, presenting EKG shows no evidence of QTc prolongation.  Plan: Continue home Zoloft and BuSpar.

## 2021-10-15 NOTE — Consult Note (Signed)
[place] [facility]iat[facility] Consult   Reason for Consult: Ingestion of bleach suicide attempt Referring Physician: Dr. Maylene Roes Patient Identification: Cody Poole MRN:  [id number] Principal Diagnosis: Ingestion of bleach Diagnosis:  Principal Problem:   Ingestion of bleach Active Problems:   Paranoid schizophrenia ([place])   Transaminitis   Wandering atrial pacemaker   Hypokalemia   Anxiety   Total Time spent with patient: 45 minutes  Subjective:   Cody Poole is a 26 y.o. male patient admitted with ingestion of bleach.  Admits to intentional ingestion of bleach" to clean out my insides".  He denies this as being a suicide attempt.  He denies any command hallucinations, telling him to harm himself and or others.  He denies any delusions that would be associated to self-harm.  Patient does seem to lack insight, as he is unable to correlate ingestion of bleach as being toxic, or detrimental to health.  He states he came to the hospital " because I could not breathe and my stomach hurt.  Not because I drank the bleach and was trying to kill myself."  Patient denies any recent suicide attempt.  He further denies any acute and or worsening psychiatric symptoms such as hallucinations, psychosis, and or paranoia.  He does not admit to intermittent hallucinations that are chronic in nature, secondary to his schizophrenia.  He verbalizes that substance use tends to make his voices louder, however he continues to use.  Patient is seen and assessed, chart reviewed, case discussed with Dr. Lovette Cliche.  On evaluation patient reports initially seeking treatment for shortness of breath and abdominal pain, secondary to drinking bleach.  He denies any auditory or visual hallucinations that prompted this admission.  Patient reports that he is seeking outpatient psychiatric help from [facility].  Patient states he currently resides with his mother, and plans to return upon discharge.   Patient states he does feel safe while in the hospital, and upon discharge.  Patient reports med compliance with his psychotropic medication to include olanzapine, gabapentin, and Suboxone.  Patient is also receiving Suboxone services at [facility].  At this time patient denies any intentional suicidal ideations, suicidal thoughts, and or suicide attempts.  He is able to contract for safety while on the unit.  Patient does appear to be at baseline, and will psychiatrically clear with discharge to outpatient resources.  During evaluation Cody Poole is sitting on side of bed in no acute distress.  He is alert, oriented x 4, calm and cooperative.  His mood is anxious with congruent affect.  He does not appear to be responding to internal/external stimuli or delusional thoughts. He denies suicidal/self-harm/homicidal ideation, psychosis, and paranoia.  Patient answered question appropriately. Discussed rehabilitation possibilities.  Patient is not aware that ingestion of bleach, is toxic and detrimental to his health.  He is able to discuss other options for cleaning out his insides.  Patient can follow-up with current outpatient psychiatric provider.   HPI:  : Cody Poole is a 26 y.o. male with medical history significant for paranoid schizophrenia, polysubstance abuse, GAD, who is admitted to [facility] on 10/14/2021 with acute bleach ingestion after presenting from home to [facility] ED complaining of abdominal pain.    The patient reports that he ingested a "swig" of bleach earlier on 10/14/2021 in order to "clear out his insides".  He conveys that this was done with therapeutic intent, denying any suicidal or homicidal ideations.  Denies ingestion of any additional household items over  the last day, nor any recent recreational drug use.  Denies any concomitant alcohol consumption. Per chart review, patient has documented history of paranoid schizophrenia as well as polysubstance abuse, with  outpatient medication regimen that includes olanzapine, gabapentin, BuSpar, Zoloft.     Past Psychiatric History: Past psych diagnoses: Reported schizophrenia, [name], PTSD Suicide attempts: Denied Psychiatric med trials: Haldol 10 mg twice daily.  Patient reported history of [place] Current outpatient psychiatrist: Dr. Loni Muse at [facility] Current outpatient therapist: Denied History of selective adherence: Yes.      Family Psychiatric History: Completed/attempted suicide: Denied Bipolar spectrum disorder: Denied Schizophrenia spectrum disorder: Father Substance abuse: Denied   Substance Abuse History: Alcohol: Rarely, occasional Nicotine: 1 PPD daily since 26 years old Illicit drugs: IVDU-meth, cocaine Rx drug abuse: Denied Seizure hx: Denied Others: Benprenorphine-naloxone (2021)   Social History:  Income: [place]: Homeless  Risk to Self:   Risk to Others:   Prior Inpatient Therapy:   Prior Outpatient Therapy:    Past Medical History:  Past Medical History:  Diagnosis Date   Anxiety    Back pain    Depression    Hypertension    Paranoid schizophrenia ([place])    History reviewed. No pertinent surgical history. Family History:  Family History  Family history unknown: Yes   Family Psychiatric  History: See above Social History:  Social History   Substance and Sexual Activity  Alcohol Use No     Social History   Substance and Sexual Activity  Drug Use Yes   Types: IV, Cocaine   Comment: Last use 05/09/20    Social History   Socioeconomic History   Marital status: Single    Spouse name: Not on file   Number of children: Not on file   Years of education: Not on file   Highest education level: Not on file  Occupational History   Not on file  Tobacco Use   Smoking status: Every Day   Smokeless tobacco: Never  Vaping Use   Vaping Use: Never used  Substance and Sexual Activity   Alcohol use: No   Drug use: Yes    Types: IV, Cocaine    Comment:  Last use 05/09/20   Sexual activity: Not on file    Comment: heroin, crack cocaine  Other Topics Concern   Not on file  Social History Narrative   Not on file   Social Determinants of Health   Financial Resource Strain: Not on file  Food Insecurity: Not on file  Transportation Needs: Not on file  Physical Activity: Not on file  Stress: Not on file  Social Connections: Not on file   Additional Social History:    Allergies:   Allergies  Allergen Reactions   Haldol [Haloperidol] Other (See Comments)    Tongue swelling  Dysarthria  Relieved with benadryl     Labs:  Results for orders placed or performed during the hospital encounter of 10/14/21 (from the past 48 hour(s))  Comprehensive metabolic panel     Status: Abnormal   Collection Time: 10/14/21 11:03 PM  Result Value Ref Range   Sodium 142 135 - 145 mmol/L   Potassium 3.2 (L) 3.5 - 5.1 mmol/L   Chloride 105 98 - 111 mmol/L   CO2 26 22 - 32 mmol/L   Glucose, Bld 121 (H) 70 - 99 mg/dL    Comment: Glucose reference range applies only to samples taken after fasting for at least 8 hours.   BUN 9 6 -  20 mg/dL   Creatinine, Ser 8.52 0.61 - 1.24 mg/dL   Calcium 9.1 8.9 - 77.8 mg/dL   Total Protein 6.7 6.5 - 8.1 g/dL   Albumin 3.6 3.5 - 5.0 g/dL   AST 242 (H) 15 - 41 U/L   ALT 690 (H) 0 - 44 U/L   Alkaline Phosphatase 219 (H) 38 - 126 U/L   Total Bilirubin 1.2 0.3 - 1.2 mg/dL   GFR, Estimated >35 >36 mL/min    Comment: (NOTE) Calculated using the CKD-EPI Creatinine Equation (2021)    Anion gap 11 5 - 15    Comment: Performed at [facility], [address], [place], [place] [address]  Ethanol     Status: None   Collection Time: 10/14/21 11:03 PM  Result Value Ref Range   Alcohol, Ethyl (B) <10 <10 mg/dL    Comment: (NOTE) Lowest detectable limit for serum alcohol is 10 mg/dL.  For medical purposes only. Performed at [facility], [address], [place], [place] [address]   Salicylate level      Status: Abnormal   Collection Time: 10/14/21 11:03 PM  Result Value Ref Range   Salicylate Lvl <7.0 (L) 7.0 - 30.0 mg/dL    Comment: Performed at [facility], [address] [place], [place] [address]  Acetaminophen level     Status: Abnormal   Collection Time: 10/14/21 11:03 PM  Result Value Ref Range   Acetaminophen (Tylenol), Serum <10 (L) 10 - 30 ug/mL    Comment: (NOTE) Therapeutic concentrations vary significantly. A range of 10-30 ug/mL  may be an effective concentration for many patients. However, some  are best treated at concentrations outside of this range. Acetaminophen concentrations >150 ug/mL at 4 hours after ingestion  and >50 ug/mL at 12 hours after ingestion are often associated with  toxic reactions.  Performed at [facility], [address], [place], [place] [address]   cbc     Status: Abnormal   Collection Time: 10/14/21 11:03 PM  Result Value Ref Range   WBC 6.2 4.0 - 10.5 K/uL   RBC 4.67 4.22 - 5.81 MIL/uL   Hemoglobin 12.8 (L) 13.0 - 17.0 g/dL   HCT 67.1 24.5 - 80.9 %   MCV 86.7 80.0 - 100.0 fL   MCH 27.4 26.0 - 34.0 pg   MCHC 31.6 30.0 - 36.0 g/dL   RDW 98.3 38.2 - 50.5 %   Platelets 282 150 - 400 K/uL   nRBC 0.0 0.0 - 0.2 %    Comment: Performed at [facility], [address] [place], [place] [address]  Lipase, blood     Status: None   Collection Time: 10/14/21 11:03 PM  Result Value Ref Range   Lipase 29 11 - 51 U/L    Comment: Performed at [facility], [address], [place], [place] [address]  CBG monitoring, ED     Status: Abnormal   Collection Time: 10/15/21 12:00 AM  Result Value Ref Range   Glucose-Capillary 110 (H) 70 - 99 mg/dL    Comment: Glucose reference range applies only to samples taken after fasting for at least 8 hours.  Lactic acid, plasma     Status: None   Collection Time: 10/15/21  1:42 AM  Result Value Ref Range   Lactic Acid, Venous 0.9 0.5 - 1.9 mmol/L    Comment: Performed at [facility], [address], [place], [place] [address]  Magnesium     Status: None   Collection Time: 10/15/21  1:42 AM  Result Value Ref Range   Magnesium 1.8 1.7 - 2.4 mg/dL    Comment: Performed at [place] [address], [place], [place] [address]  CK     Status: None   Collection Time: 10/15/21  1:42 AM  Result Value Ref Range   Total CK 232 49 - 397 U/L    Comment: Performed at [facility], [place] [address], [place], [place] [address]  Lactic acid, plasma     Status: Abnormal   Collection Time: 10/15/21  6:42 AM  Result Value Ref Range   Lactic Acid, Venous 2.1 (HH) 0.5 - 1.9 mmol/L    Comment: CRITICAL RESULT CALLED TO, READ BACK BY AND VERIFIED WITH: Minette Brine, RN [phone] 10/15/21 L. KLAR Performed at [facility], [place] [address], [place], [place] [address]   CBC with Differential/Platelet     Status: None   Collection Time: 10/15/21  6:42 AM  Result Value Ref Range   WBC 5.7 4.0 - 10.5 K/uL   RBC 5.22 4.22 - 5.81 MIL/uL   Hemoglobin 14.4 13.0 - 17.0 g/dL   HCT 44.9 39.0 - 52.0 %   MCV 86.0 80.0 - 100.0 fL   MCH 27.6 26.0 - 34.0 pg   MCHC 32.1 30.0 - 36.0 g/dL   RDW 13.8 11.5 - 15.5 %   Platelets 291 150 - 400 K/uL   nRBC 0.0 0.0 - 0.2 %   Neutrophils Relative % 48 %   Neutro Abs 2.7 1.7 - 7.7 K/uL   Lymphocytes Relative 37 %   Lymphs Abs 2.1 0.7 - 4.0 K/uL   Monocytes Relative 13 %   Monocytes Absolute 0.8 0.1 - 1.0 K/uL   Eosinophils Relative 1 %   Eosinophils Absolute 0.1 0.0 - 0.5 K/uL   Basophils Relative 1 %   Basophils Absolute 0.1 0.0 - 0.1 K/uL   Immature Granulocytes 0 %   Abs Immature Granulocytes 0.01 0.00 - 0.07 K/uL    Comment: Performed at [facility], [address] [place], [place] [address]  Comprehensive metabolic panel     Status: Abnormal   Collection Time: 10/15/21  6:42 AM  Result Value Ref Range   Sodium 140 135 - 145 mmol/L   Potassium 3.6 3.5 - 5.1 mmol/L   Chloride 101 98 - 111 mmol/L   CO2 25 22 - 32 mmol/L    Glucose, Bld 140 (H) 70 - 99 mg/dL    Comment: Glucose reference range applies only to samples taken after fasting for at least 8 hours.   BUN 7 6 - 20 mg/dL   Creatinine, Ser 0.78 0.61 - 1.24 mg/dL   Calcium 8.9 8.9 - 10.3 mg/dL   Total Protein 6.3 (L) 6.5 - 8.1 g/dL   Albumin 3.2 (L) 3.5 - 5.0 g/dL   AST 291 (H) 15 - 41 U/L   ALT 664 (H) 0 - 44 U/L   Alkaline Phosphatase 187 (H) 38 - 126 U/L   Total Bilirubin 1.0 0.3 - 1.2 mg/dL   GFR, Estimated >60 >60 mL/min    Comment: (NOTE) Calculated using the CKD-EPI Creatinine Equation (2021)    Anion gap 14 5 - 15    Comment: Performed at [facility], [place] [address] [place], [place] [address]  Magnesium     Status: None   Collection Time: 10/15/21  6:42 AM  Result Value Ref Range   Magnesium 1.7 1.7 - 2.4 mg/dL    Comment: Performed at [facility], [address] [place], [place] [address]  Protime-INR     Status: None   Collection Time: 10/15/21  6:42 AM  Result Value Ref Range   Prothrombin Time 14.4 11.4 - 15.2 seconds   INR 1.1 0.8 - 1.2    Comment: (NOTE) INR goal varies based on device and disease states. Performed at [facility], [address] [place], [place] [address]   Lactate dehydrogenase     Status: Abnormal   Collection Time: 10/15/21  6:42 AM  Result Value Ref Range   LDH 279 (H) 98 - 192 U/L    Comment: Performed at [facility], [address], [place], [place] [address]    Current Facility-Administered Medications  Medication Dose Route Frequency Provider Last Rate Last Admin   0.9 %  sodium chloride infusion   Intravenous Continuous Howerter, Justin B, DO 125 mL/hr at 10/15/21 0806 New Bag at 10/15/21 0806   acetaminophen (TYLENOL) tablet 650 mg  650 mg Oral Q6H PRN Howerter, Justin B, DO       Or   acetaminophen (TYLENOL) suppository 650 mg  650 mg Rectal Q6H PRN Howerter, Justin B, DO       busPIRone (BUSPAR) tablet 5 mg  5 mg Oral Daily Howerter, Justin B, DO        fentaNYL (SUBLIMAZE) injection 50 mcg  50 mcg Intravenous Q2H PRN Howerter, Justin B, DO       gabapentin (NEURONTIN) capsule 300 mg  300 mg Oral Daily Howerter, Justin B, DO       naloxone (NARCAN) injection 0.4 mg  0.4 mg Intravenous PRN Howerter, Justin B, DO       OLANZapine (ZYPREXA) tablet 10 mg  10 mg Oral Daily Howerter, Justin B, DO       ondansetron (ZOFRAN) injection 4 mg  4 mg Intravenous Q6H PRN Howerter, Justin B, DO       potassium chloride 10 mEq in 100 mL IVPB  10 mEq Intravenous Q1 Hr x 4 Howerter, Justin B, DO 100 mL/hr at 10/15/21 0843 10 mEq at 10/15/21 0843   potassium chloride 10 mEq in 100 mL IVPB  10 mEq Intravenous Q1 Hr x 2 Noralee Stain, DO       sertraline (ZOLOFT) tablet 50 mg  50 mg Oral Daily Howerter, Justin B, DO       Current Outpatient Medications  Medication Sig Dispense Refill   buprenorphine-naloxone (SUBOXONE) 8-2 mg SUBL SL tablet Place 1 tablet under the tongue in the morning and at bedtime.     BUSPIRONE HCL PO Take 1 tablet by mouth daily.     clonazePAM (KLONOPIN) 1 MG tablet Take 1 mg by mouth daily.     gabapentin (NEURONTIN) 100 MG capsule Take 1 capsule (100 mg total) by mouth 3 (three) times daily. (Patient not taking: Reported on 09/23/2021) 21 capsule 0   gabapentin (NEURONTIN) 300 MG capsule Take 300 mg by mouth daily.     OLANZapine (ZYPREXA) 10 MG tablet Take 10 mg by mouth daily.     OLANZapine (ZYPREXA) 5 MG tablet Take 1 tablet (5 mg total) by mouth 2 (two) times daily. (Patient not taking: Reported on 09/23/2021) 14 tablet 0   sertraline (ZOLOFT) 50 MG tablet Take 50 mg by mouth daily.      Musculoskeletal: Strength & Muscle Tone: within  normal limits Gait & Station: normal Patient leans: Right            Psychiatric Specialty Exam:  Presentation  General Appearance: Appropriate for Environment  Eye Contact:Fair  Speech:Clear and Coherent  Speech Volume:Normal  Handedness:Right   Mood and Affect   Mood:Euthymic  Affect:Congruent   Thought Process  Thought Processes:Coherent  Descriptions of Associations:Intact  Orientation:Full (Time, Place and Person)  Thought Content:Logical  History of Schizophrenia/Schizoaffective disorder:Yes  Duration of Psychotic Symptoms:Greater than six months  Hallucinations:No data recorded Ideas of Reference:None  Suicidal Thoughts:No data recorded Homicidal Thoughts:No data recorded  Sensorium  Memory:Immediate Fair; Recent Fair; Remote Fair  Judgment:Intact  Insight:Fair   Executive Functions  Concentration:Fair  Attention Span:Fair  [place]   Psychomotor Activity  Psychomotor Activity:No data recorded  Assets  Assets:Communication Skills; Desire for Improvement; Intimacy; Physical Health   Sleep  Sleep:No data recorded  Physical Exam: Physical Exam Constitutional:      Appearance: He is normal weight.  Neurological:     Mental Status: He is alert.    ROS Blood pressure (!) 136/92, pulse (!) 129, temperature 98.6 F (37 C), temperature source Oral, resp. rate 13, height 6\' 1"  (1.854 m), weight 81.6 kg, SpO2 97 %. Body mass index is 23.73 kg/m.  Treatment Plan Summary: Plan continue psychiatric medication.  No changes or adjustments will be made at this time.  Patient appears to be at his baseline.  Psychiatry will sign off at this time. -May rescind IVC, patient is no longer a danger to self. -Discontinue suicide sitter Disposition: No evidence of imminent risk to self or others at present.   Patient does not meet criteria for psychiatric inpatient admission. Supportive therapy provided about ongoing stressors. Refer to IOP. Discussed crisis plan, support from social network, calling 911, coming to the Emergency Department, and calling Suicide Hotline.  Suella Broad, FNP 10/15/2021 9:39 AM

## 2021-10-15 NOTE — Progress Notes (Signed)
I was notified by EDP that the patient is in the process of being IVC'ed.      Newton Pigg, DO Hospitalist

## 2021-10-15 NOTE — Assessment & Plan Note (Signed)
  #)   Paranoid schizophrenia: Documented history of such, on olanzapine as well as gabapentin as an outpatient  Plan: May consider consulting psychiatry for assistance with optimization of outpatient I psychotic regimen in the setting of his presenting intentional bleach ingestion without any associated suicidal or homicidal ideations.  For now, continuing olanzapine and gabapentin.

## 2021-10-15 NOTE — ED Notes (Signed)
Belongings inventoried and placed in bag at desk. Pt still wearing grey shorts.

## 2021-10-15 NOTE — ED Notes (Signed)
IVC paperwork completed, at nurse station in blue zone.
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
## 2021-10-16 DIAGNOSIS — T5491XA Toxic effect of unspecified corrosive substance, accidental (unintentional), initial encounter: Secondary | ICD-10-CM | POA: Diagnosis not present

## 2021-10-16 LAB — COMPREHENSIVE METABOLIC PANEL
ALT: 542 U/L — ABNORMAL HIGH (ref 0–44)
AST: 317 U/L — ABNORMAL HIGH (ref 15–41)
Albumin: 2.7 g/dL — ABNORMAL LOW (ref 3.5–5.0)
Alkaline Phosphatase: 152 U/L — ABNORMAL HIGH (ref 38–126)
Anion gap: 7 (ref 5–15)
BUN: <5 mg/dL — ABNORMAL LOW (ref 6–20)
CO2: 27 mmol/L (ref 22–32)
Calcium: 8.7 mg/dL — ABNORMAL LOW (ref 8.9–10.3)
Chloride: 107 mmol/L (ref 98–111)
Creatinine, Ser: 0.79 mg/dL (ref 0.61–1.24)
GFR, Estimated: >60 mL/min (ref >60)
Glucose, Bld: 122 mg/dL — ABNORMAL HIGH (ref 70–99)
Potassium: 3.4 mmol/L — ABNORMAL LOW (ref 3.5–5.1)
Sodium: 141 mmol/L (ref 135–145)
Total Bilirubin: 1 mg/dL (ref 0.3–1.2)
Total Protein: 5.3 g/dL — ABNORMAL LOW (ref 6.5–8.1)

## 2021-10-16 MED ORDER — POTASSIUM CHLORIDE CRYS ER 20 MEQ PO TBCR
40.0000 meq | EXTENDED_RELEASE_TABLET | Freq: Once | ORAL | Status: AC
Start: 2021-10-16 — End: 2021-10-16
  Administered 2021-10-16: 40 meq via ORAL
  Filled 2021-10-16: qty 2

## 2021-10-16 NOTE — Progress Notes (Signed)
  Transition of Care [facility]) Screening Note   Patient Details  Name: Jencarlo Bonadonna Date of Birth: [birth year]   Transition of Care [facility]) CM/SW Contact:    Harriet Masson, RN Phone Number: 10/16/2021, 8:09 AM    Transition of Care Department [facility]) has reviewed patient. History of paranoid schizophrenia, polysubstance abuse who ingested bleach to "clean out insides". Psychiatry to evaluate. TOC will continue to follow for needs.

## 2021-10-16 NOTE — Progress Notes (Signed)
Initial Nutrition Assessment  DOCUMENTATION CODES:   Not applicable  INTERVENTION:  Ensure Max po BID, each supplement provides 150 kcal and 30 grams of protein.  MVI with minerals daily  NUTRITION DIAGNOSIS:   Increased nutrient needs related to acute illness as evidenced by estimated needs.  GOAL:   Patient will meet greater than or equal to 90% of their needs  MONITOR:   PO intake, Supplement acceptance, Labs, Weight trends  REASON FOR ASSESSMENT:   Malnutrition Screening Tool    ASSESSMENT:   Pt admitted with abdominal pain following ingestion of bleach. PMH significant for paranoid schizophrenia, polysubstance use and GAD.  Unable to obtain detailed nutrition information as pt was resting comfortably. Spoke with nurse and sitter at door. They state he was doing well and tolerating clear liquid diet and was requesting a regular diet as he was feeling hungry.    Meal completions: 07/06: 100%-lunch  Reviewed wt history. There is limited documentation within the last year of weights. Last 3 documented wts are 81.6 kg. Uncertain if these are accurate or stated wt's. Will continue to monitor throughout admission.   Medications reviewed  Labs: potassium 3.4 (replacing), BUN <5, alkaline phosphatase 152, AST 317, ALT 542, CBG 110   NUTRITION - FOCUSED PHYSICAL EXAM: Pt resting- deferred to follow up.   Diet Order:   Diet Order             Diet regular Room service appropriate? Yes; Fluid consistency: Thin  Diet effective now                   EDUCATION NEEDS:   No education needs have been identified at this time  Skin:  Skin Assessment: Reviewed RN Assessment  Last BM:  unknown  Height:   Ht Readings from Last 1 Encounters:  10/14/21 6\' 1"  (1.854 m)    Weight:   Wt Readings from Last 1 Encounters:  10/14/21 81.6 kg   BMI:  Body mass index is 23.73 kg/m.  Estimated Nutritional Needs:   Kcal:  2000-2200  Protein:  100-115g  Fluid:   >/=2L  12/15/21, RDN, LDN Clinical Nutrition

## 2021-10-16 NOTE — Progress Notes (Addendum)
Pt on clear liquid diet, pt arrived to unit eating graham crackers was informed by prior nurse provider is okay with pt eating.. no new orders to go off. Clarified with on call provider diet orders, provider prefers to keep pt on clear liquid diet for now. Day shift provider will follow up with new orders. No changes to orders at this time. Pt resting, sitter at bedside

## 2021-10-16 NOTE — Progress Notes (Addendum)
PROGRESS NOTE    Cody Poole  IWL:[id number] DOB: 1995-11-21 DOA: 10/14/2021 PCP: [facility]     Brief Narrative:  Cody Poole is a 26 y.o. male with medical history significant for paranoid schizophrenia, polysubstance abuse, GAD, who is admitted to [facility] on 10/14/2021 with acute bleach ingestion after presenting from home to [facility] ED complaining of abdominal pain.    The patient reports that he ingested a "swig" of bleach earlier on 10/14/2021 in order to "clear out his insides".  He conveys that this was done with therapeutic intent, denying any suicidal or homicidal ideations.  Denies ingestion of any additional household items over the last day, nor any recent recreational drug use.  Denies any concomitant alcohol consumption.   Following bleach ingestion, the patient reports development of mild nonradiating epigastric discomfort, which he describes as sharp in nature.  States that the pain has not been intensifying since onset.  Reports some associated nausea in the absence of any vomiting.   Per poison control recommendations, patient was started on N-acetylcysteine and monitor closely.  He was involuntarily committed in the emergency department.  Sitter is at bedside due to his flight risk.  New events last 24 hours / Subjective: Patient alert and oriented to place and situation.  He does recall intentionally taking bleach to "clean his insides."  He denies any abdominal pain or nausea.  Tolerating clear liquid diet and admits to hunger, wants to eat more today.  Assessment & Plan:  Principal Problem:   Ingestion of bleach Active Problems:   Paranoid schizophrenia (HCC)   Transaminitis   Wandering atrial pacemaker   Hypokalemia   Anxiety   Intentional bleach ingestion -At poison control's recommendation, patient was started on N-acetylcysteine at time of admission.  Has now been cleared by poison control and N-acetylcysteine was discontinued. -With  his history of paranoid schizophrenia, patient was IVC'd in the emergency department due to trying to flee the hospital.  Ophelia Charter is at bedside.  Psych is consulted. -Advance diet as tolerated -Continue home olanzapine, gabapentin  Generalized anxiety disorder -Continue Zoloft and BuSpar  Acute transaminitis -Hepatitis C antibody positive -Hoping to see plateau soon, continue to monitor  Wandering atrial pacemaker -EDP discussed with cardiology on-call, conveyed that no additional evaluation is necessary at this time  Polysubstance abuse -UDS positive for amphetamine, opiate, THC  Hypokalemia -Replace, trend   DVT prophylaxis:  SCDs Start: 10/15/21 0333  Code Status: Full code Family Communication: None Disposition Plan:  Status is: Inpatient Remains inpatient appropriate because: Psych consulted  Patient is medically stable for disposition pending psychiatry recommendation.    Antimicrobials:  Anti-infectives (From admission, onward)    None        Objective: Vitals:   10/15/21 2035 10/16/21 0541 10/16/21 0736 10/16/21 1100  BP: (!) 134/94 (!) 128/92 (!) 124/92 124/83  Pulse:   93 71  Resp: 15  15 14   Temp: (!) 97.1 F (36.2 C)     TempSrc: Oral  Oral   SpO2: 96%  96%   Weight:      Height:        Intake/Output Summary (Last 24 hours) at 10/16/2021 1313 Last data filed at 10/16/2021 1100 Gross per 24 hour  Intake 960 ml  Output 300 ml  Net 660 ml   Filed Weights   10/14/21 2112  Weight: 81.6 kg    Examination:  General exam: Appears calm and comfortable  Respiratory system: Clear to auscultation. Respiratory effort  normal. No respiratory distress. No conversational dyspnea.  Cardiovascular system: S1 & S2 heard, irregular rhythm, tachycardic rate 100. No murmurs. No pedal edema. Gastrointestinal system: Abdomen is nondistended, soft and nontender. Normal bowel sounds heard. Central nervous system: Alert and oriented.  Extremities: Symmetric in  appearance  Skin: No rashes, lesions or ulcers on exposed skin   Data Reviewed: I have personally reviewed following labs and imaging studies  CBC: Recent Labs  Lab 10/14/21 2303 10/15/21 0642  WBC 6.2 5.7  NEUTROABS  --  2.7  HGB 12.8* 14.4  HCT 40.5 44.9  MCV 86.7 86.0  PLT 282 291   Basic Metabolic Panel: Recent Labs  Lab 10/14/21 2303 10/15/21 0142 10/15/21 0642 10/16/21 0353  NA 142  --  140 141  K 3.2*  --  3.6 3.4*  CL 105  --  101 107  CO2 26  --  25 27  GLUCOSE 121*  --  140* 122*  BUN 9  --  7 <5*  CREATININE 0.89  --  0.78 0.79  CALCIUM 9.1  --  8.9 8.7*  MG  --  1.8 1.7  --    GFR: Estimated Creatinine Clearance: 159.5 mL/min (by C-G formula based on SCr of 0.79 mg/dL). Liver Function Tests: Recent Labs  Lab 10/14/21 2303 10/15/21 0642 10/16/21 0353  AST 345* 291* 317*  ALT 690* 664* 542*  ALKPHOS 219* 187* 152*  BILITOT 1.2 1.0 1.0  PROT 6.7 6.3* 5.3*  ALBUMIN 3.6 3.2* 2.7*   Recent Labs  Lab 10/14/21 2303  LIPASE 29   No results for input(s): "AMMONIA" in the last 168 hours. Coagulation Profile: Recent Labs  Lab 10/15/21 0642  INR 1.1   Cardiac Enzymes: Recent Labs  Lab 10/15/21 0142  CKTOTAL 232   BNP (last 3 results) No results for input(s): "PROBNP" in the last 8760 hours. HbA1C: No results for input(s): "HGBA1C" in the last 72 hours. CBG: Recent Labs  Lab 10/15/21 0000  GLUCAP 110*   Lipid Profile: No results for input(s): "CHOL", "HDL", "LDLCALC", "TRIG", "CHOLHDL", "LDLDIRECT" in the last 72 hours. Thyroid Function Tests: No results for input(s): "TSH", "T4TOTAL", "FREET4", "T3FREE", "THYROIDAB" in the last 72 hours. Anemia Panel: No results for input(s): "VITAMINB12", "FOLATE", "FERRITIN", "TIBC", "IRON", "RETICCTPCT" in the last 72 hours. Sepsis Labs: Recent Labs  Lab 10/15/21 0142 10/15/21 9924  LATICACIDVEN 0.9 2.1*    No results found for this or any previous visit (from the past 240 hour(s)).     Radiology Studies: DG Chest Port 1 View  Result Date: 10/15/2021 CLINICAL DATA:  Pain. EXAM: PORTABLE CHEST 1 VIEW COMPARISON:  Chest x-ray 10/15/2021. FINDINGS: The heart size and mediastinal contours are within normal limits. Both lungs are clear. No visible pleural effusions or pneumothorax. No acute osseous abnormality. IMPRESSION: No active disease. Electronically Signed   By: Feliberto Harts M.D.   On: 10/15/2021 07:55   DG Chest 2 View  Result Date: 10/15/2021 CLINICAL DATA:  Caustic ingestion. EXAM: CHEST - 2 VIEW COMPARISON:  May 15, 2021 FINDINGS: The heart size and mediastinal contours are within normal limits. Both lungs are clear. The visualized skeletal structures are unremarkable. IMPRESSION: No active cardiopulmonary disease. Electronically Signed   By: Aram Candela M.D.   On: 10/15/2021 02:06      Scheduled Meds:  busPIRone  5 mg Oral Daily   gabapentin  300 mg Oral Daily   OLANZapine  10 mg Oral Daily   sertraline  50 mg Oral  Daily   Continuous Infusions:  sodium chloride 125 mL/hr at 10/15/21 0806     LOS: 1 day     Dessa Phi, DO [facility] 10/16/2021, 1:13 PM   Available via Epic secure chat 7am-7pm After these hours, please refer to coverage provider listed on [email or web address]

## 2021-10-17 ENCOUNTER — Inpatient Hospital Stay (HOSPITAL_COMMUNITY): Payer: No Typology Code available for payment source

## 2021-10-17 DIAGNOSIS — T5491XA Toxic effect of unspecified corrosive substance, accidental (unintentional), initial encounter: Secondary | ICD-10-CM | POA: Diagnosis not present

## 2021-10-17 LAB — COMPREHENSIVE METABOLIC PANEL
ALT: 788 U/L — ABNORMAL HIGH (ref 0–44)
AST: 623 U/L — ABNORMAL HIGH (ref 15–41)
Albumin: 2.9 g/dL — ABNORMAL LOW (ref 3.5–5.0)
Alkaline Phosphatase: 173 U/L — ABNORMAL HIGH (ref 38–126)
Anion gap: 5 (ref 5–15)
BUN: 8 mg/dL (ref 6–20)
CO2: 27 mmol/L (ref 22–32)
Calcium: 8.7 mg/dL — ABNORMAL LOW (ref 8.9–10.3)
Chloride: 108 mmol/L (ref 98–111)
Creatinine, Ser: 0.95 mg/dL (ref 0.61–1.24)
GFR, Estimated: >60 mL/min (ref >60)
Glucose, Bld: 117 mg/dL — ABNORMAL HIGH (ref 70–99)
Potassium: 3.5 mmol/L (ref 3.5–5.1)
Sodium: 140 mmol/L (ref 135–145)
Total Bilirubin: 0.6 mg/dL (ref 0.3–1.2)
Total Protein: 5.7 g/dL — ABNORMAL LOW (ref 6.5–8.1)

## 2021-10-17 LAB — MAGNESIUM: Magnesium: 1.6 mg/dL — ABNORMAL LOW (ref 1.7–2.4)

## 2021-10-17 MED ORDER — NICOTINE 14 MG/24HR TD PT24
14.0000 mg | MEDICATED_PATCH | Freq: Every day | TRANSDERMAL | Status: DC
  Administered 2021-10-17 – 2021-10-21 (×5): 14 mg via TRANSDERMAL
  Filled 2021-10-17 (×5): qty 1

## 2021-10-17 NOTE — Progress Notes (Signed)
PROGRESS NOTE    Cody Poole  FYB:[id number] DOB: [birth year] DOA: 10/14/2021 PCP: [facility]     Brief Narrative:  Cody Poole is a 26 y.o. male with medical history significant for paranoid schizophrenia, polysubstance abuse, GAD, who is admitted to [facility] on 10/14/2021 with acute bleach ingestion after presenting from home to [facility] ED complaining of abdominal pain.    The patient reports that he ingested a "swig" of bleach earlier on 10/14/2021 in order to "clear out his insides".  He conveys that this was done with therapeutic intent, denying any suicidal or homicidal ideations.  Denies ingestion of any additional household items over the last day, nor any recent recreational drug use.  Denies any concomitant alcohol consumption.   Following bleach ingestion, the patient reports development of mild nonradiating epigastric discomfort, which he describes as sharp in nature.  States that the pain has not been intensifying since onset.  Reports some associated nausea in the absence of any vomiting.   Per poison control recommendations, patient was started on N-acetylcysteine and monitor closely.  He was involuntarily committed in the emergency department.  Sitter was ordered at bedside due to his flight risk.  New events last 24 hours / Subjective: Patient feeling well and tolerating diet.  Denies any physical complaints.  Assessment & Plan:  Principal Problem:   Ingestion of bleach Active Problems:   Paranoid schizophrenia (HCC)   Transaminitis   Wandering atrial pacemaker   Hypokalemia   Anxiety   Intentional bleach ingestion -At poison control's recommendation, patient was started on N-acetylcysteine at time of admission.  Has now been cleared by poison control and N-acetylcysteine was discontinued. -With his history of paranoid schizophrenia, patient was IVC'd in the emergency department due to trying to flee the hospital.  Ophelia Charter was ordered for  bedside -Psychiatry consulted, cleared for discharge -IVC discontinued today.  Sitter discontinued. -Tolerating regular diet -Continue home olanzapine, gabapentin  Generalized anxiety disorder -Continue Zoloft and BuSpar  Acute transaminitis -Hepatitis C antibody positive -Check right upper quadrant ultrasound -Repeat labs in the morning  Wandering atrial pacemaker -EDP discussed with cardiology on-call, conveyed that no additional evaluation is necessary at this time  Polysubstance abuse -UDS positive for amphetamine, opiate, THC -Nicotine patch    DVT prophylaxis:  SCDs Start: 10/15/21 0333  Code Status: Full code Family Communication: Called mom listed in emergency contact, phone has been disconnected.  Called sister listed without answer Disposition Plan:  Status is: Inpatient Remains inpatient appropriate because: Elevated LFTs  Antimicrobials:  Anti-infectives (From admission, onward)    None        Objective: Vitals:   10/17/21 0213 10/17/21 0530 10/17/21 0830 10/17/21 1210  BP:  139/86 128/80 118/80  Pulse:   92 69  Resp:  (!) 25 14 18   Temp:   97.7 F (36.5 C) 97.9 F (36.6 C)  TempSrc:   Oral Oral  SpO2:    96%  Weight: 73.5 kg     Height:        Intake/Output Summary (Last 24 hours) at 10/17/2021 1321 Last data filed at 10/16/2021 2045 Gross per 24 hour  Intake 474.67 ml  Output --  Net 474.67 ml    Filed Weights   10/14/21 2112 10/17/21 0213  Weight: 81.6 kg 73.5 kg    Examination:  General exam: Appears calm and comfortable  Respiratory system: Clear to auscultation. Respiratory effort normal. No respiratory distress. No conversational dyspnea.  Cardiovascular system: S1 & S2  heard, RRR. No murmurs. No pedal edema. Gastrointestinal system: Abdomen is nondistended, soft and nontender. Normal bowel sounds heard. Central nervous system: Alert and oriented.  Extremities: Symmetric in appearance  Skin: No rashes, lesions or ulcers on  exposed skin   Data Reviewed: I have personally reviewed following labs and imaging studies  CBC: Recent Labs  Lab 10/14/21 2303 10/15/21 0642  WBC 6.2 5.7  NEUTROABS  --  2.7  HGB 12.8* 14.4  HCT 40.5 44.9  MCV 86.7 86.0  PLT 282 291    Basic Metabolic Panel: Recent Labs  Lab 10/14/21 2303 10/15/21 0142 10/15/21 0642 10/16/21 0353 10/17/21 0034  NA 142  --  140 141 140  K 3.2*  --  3.6 3.4* 3.5  CL 105  --  101 107 108  CO2 26  --  25 27 27   GLUCOSE 121*  --  140* 122* 117*  BUN 9  --  7 <5* 8  CREATININE 0.89  --  0.78 0.79 0.95  CALCIUM 9.1  --  8.9 8.7* 8.7*  MG  --  1.8 1.7  --  1.6*    GFR: Estimated Creatinine Clearance: 123.6 mL/min (by C-G formula based on SCr of 0.95 mg/dL). Liver Function Tests: Recent Labs  Lab 10/14/21 2303 10/15/21 0642 10/16/21 0353 10/17/21 0034  AST 345* 291* 317* 623*  ALT 690* 664* 542* 788*  ALKPHOS 219* 187* 152* 173*  BILITOT 1.2 1.0 1.0 0.6  PROT 6.7 6.3* 5.3* 5.7*  ALBUMIN 3.6 3.2* 2.7* 2.9*    Recent Labs  Lab 10/14/21 2303  LIPASE 29    No results for input(s): "AMMONIA" in the last 168 hours. Coagulation Profile: Recent Labs  Lab 10/15/21 0642  INR 1.1    Cardiac Enzymes: Recent Labs  Lab 10/15/21 0142  CKTOTAL 232    BNP (last 3 results) No results for input(s): "PROBNP" in the last 8760 hours. HbA1C: No results for input(s): "HGBA1C" in the last 72 hours. CBG: Recent Labs  Lab 10/15/21 0000  GLUCAP 110*    Lipid Profile: No results for input(s): "CHOL", "HDL", "LDLCALC", "TRIG", "CHOLHDL", "LDLDIRECT" in the last 72 hours. Thyroid Function Tests: No results for input(s): "TSH", "T4TOTAL", "FREET4", "T3FREE", "THYROIDAB" in the last 72 hours. Anemia Panel: No results for input(s): "VITAMINB12", "FOLATE", "FERRITIN", "TIBC", "IRON", "RETICCTPCT" in the last 72 hours. Sepsis Labs: Recent Labs  Lab 10/15/21 0142 10/15/21 12/16/21  LATICACIDVEN 0.9 2.1*     No results found for  this or any previous visit (from the past 240 hour(s)).    Radiology Studies: No results found.    Scheduled Meds:  busPIRone  5 mg Oral Daily   gabapentin  300 mg Oral Daily   nicotine  14 mg Transdermal Daily   OLANZapine  10 mg Oral Daily   sertraline  50 mg Oral Daily   Continuous Infusions:     LOS: 2 days     [id number], DO [facility] 10/17/2021, 1:21 PM   Available via Epic secure chat 7am-7pm After these hours, please refer to coverage provider listed on [email or web address]

## 2021-10-18 DIAGNOSIS — K712 Toxic liver disease with acute hepatitis: Secondary | ICD-10-CM

## 2021-10-18 DIAGNOSIS — T5491XA Toxic effect of unspecified corrosive substance, accidental (unintentional), initial encounter: Secondary | ICD-10-CM | POA: Diagnosis not present

## 2021-10-18 LAB — COMPREHENSIVE METABOLIC PANEL
ALT: 1238 U/L — ABNORMAL HIGH (ref 0–44)
AST: 1166 U/L — ABNORMAL HIGH (ref 15–41)
Albumin: 3 g/dL — ABNORMAL LOW (ref 3.5–5.0)
Alkaline Phosphatase: 202 U/L — ABNORMAL HIGH (ref 38–126)
Anion gap: 7 (ref 5–15)
BUN: 9 mg/dL (ref 6–20)
CO2: 28 mmol/L (ref 22–32)
Calcium: 9.1 mg/dL (ref 8.9–10.3)
Chloride: 105 mmol/L (ref 98–111)
Creatinine, Ser: 0.8 mg/dL (ref 0.61–1.24)
GFR, Estimated: >60 mL/min (ref >60)
Glucose, Bld: 93 mg/dL (ref 70–99)
Potassium: 4 mmol/L (ref 3.5–5.1)
Sodium: 140 mmol/L (ref 135–145)
Total Bilirubin: 0.8 mg/dL (ref 0.3–1.2)
Total Protein: 5.9 g/dL — ABNORMAL LOW (ref 6.5–8.1)

## 2021-10-18 MED ORDER — BUPRENORPHINE HCL-NALOXONE HCL 8-2 MG SL SUBL
1.0000 | SUBLINGUAL_TABLET | Freq: Every day | SUBLINGUAL | Status: DC
  Administered 2021-10-18 – 2021-10-21 (×4): 1 via SUBLINGUAL
  Filled 2021-10-18 (×4): qty 1

## 2021-10-18 MED ORDER — KETOROLAC TROMETHAMINE 15 MG/ML IJ SOLN
15.0000 mg | Freq: Once | INTRAMUSCULAR | Status: AC | PRN
  Administered 2021-10-18: 15 mg via INTRAVENOUS
  Filled 2021-10-18: qty 1

## 2021-10-18 NOTE — Progress Notes (Signed)
PROGRESS NOTE    Cody Poole  EEF:[id number] DOB: Nov 28, 1995 DOA: 10/14/2021 PCP: [facility]     Brief Narrative:  Cody Poole is a 26 y.o. male with medical history significant for paranoid schizophrenia, polysubstance abuse, GAD, who is admitted to [facility] on 10/14/2021 with acute bleach ingestion after presenting from home to [facility] ED complaining of abdominal pain.    The patient reports that he ingested a "swig" of bleach earlier on 10/14/2021 in order to "clear out his insides".  He conveys that this was done with therapeutic intent, denying any suicidal or homicidal ideations.  Denies ingestion of any additional household items over the last day, nor any recent recreational drug use.  Denies any concomitant alcohol consumption.   Following bleach ingestion, the patient reports development of mild nonradiating epigastric discomfort, which he describes as sharp in nature.  States that the pain has not been intensifying since onset.  Reports some associated nausea in the absence of any vomiting.   Per poison control recommendations, patient was started on N-acetylcysteine and monitor closely.  He was involuntarily committed in the emergency department.  Sitter was ordered at bedside due to his flight risk.  New events last 24 hours / Subjective: Seen and examined.  Complains of pain in legs and ankle.  Tells me that he takes Suboxone as outpatient.  No other complaint.  Wants to go home but understands that he needs to stay in the hospital.  Assessment & Plan:  Principal Problem:   Ingestion of bleach Active Problems:   Paranoid schizophrenia (HCC)   Transaminitis   Wandering atrial pacemaker   Hypokalemia   Anxiety   Acute toxic hepatitis   Intentional bleach ingestion -At poison control's recommendation, patient was started on N-acetylcysteine at time of admission.  Has now been cleared by poison control and N-acetylcysteine was discontinued. -With his  history of paranoid schizophrenia, patient was IVC'd in the emergency department due to trying to flee the hospital.  Ophelia Charter was ordered for bedside -Psychiatry consulted, cleared for discharge -IVC and sitter discontinued per psychiatry recommendations. -Tolerating regular diet -Continue home olanzapine, gabapentin  Generalized anxiety disorder -Continue Zoloft and BuSpar  Acute toxic transaminitis -Hepatitis C antibody positive Rising LFTs, almost doubled than yesterday.  Could be secondary to bleach ingestion or any other toxins that he accidentally ingested.  Ultrasound liver negative.  Needs to stay in the hospital to follow liver enzymes closely as he remains at high risk of fulminant hepatic failure.  He understands that.  Wandering atrial pacemaker -EDP discussed with cardiology on-call, conveyed that no additional evaluation is necessary at this time  Polysubstance abuse -UDS positive for amphetamine, opiate, THC -Nicotine patch  Chronic opioid dependence: We will discontinue fentanyl and resume Suboxone.  DVT prophylaxis:  SCDs Start: 10/15/21 8832  Code Status: Full code Family Communication: None present at bedside. Disposition Plan:  Status is: Inpatient Remains inpatient appropriate because: Elevated LFTs  Antimicrobials:  Anti-infectives (From admission, onward)    None        Objective: Vitals:   10/17/21 2030 10/17/21 2336 10/18/21 0347 10/18/21 0839  BP: 124/82 126/73 120/76 135/89  Pulse: 77 60 64 72  Resp: 20 18 16 18   Temp: 97.8 F (36.6 C) 98.3 F (36.8 C) 98.7 F (37.1 C) 98.5 F (36.9 C)  TempSrc: Oral Oral Oral Oral  SpO2: 94% 94% 96%   Weight:      Height:        Intake/Output  Summary (Last 24 hours) at 10/18/2021 0928 Last data filed at 10/18/2021 0419 Gross per 24 hour  Intake 600 ml  Output --  Net 600 ml    Filed Weights   10/14/21 2112 10/17/21 0213  Weight: 81.6 kg 73.5 kg    Examination:  General exam: Appears calm  and comfortable  Respiratory system: Clear to auscultation. Respiratory effort normal. Cardiovascular system: S1 & S2 heard, RRR. No JVD, murmurs, rubs, gallops or clicks. No pedal edema. Gastrointestinal system: Abdomen is nondistended, soft and nontender. No organomegaly or masses felt. Normal bowel sounds heard. Central nervous system: Alert and oriented. No focal neurological deficits. Extremities: Symmetric 5 x 5 power. Skin: No rashes, lesions or ulcers.  Psychiatry: Judgement and insight appear poor  Data Reviewed: I have personally reviewed following labs and imaging studies  CBC: Recent Labs  Lab 10/14/21 2303 10/15/21 0642  WBC 6.2 5.7  NEUTROABS  --  2.7  HGB 12.8* 14.4  HCT 40.5 44.9  MCV 86.7 86.0  PLT 282 291    Basic Metabolic Panel: Recent Labs  Lab 10/14/21 2303 10/15/21 0142 10/15/21 0642 10/16/21 0353 10/17/21 0034 10/18/21 0105  NA 142  --  140 141 140 140  K 3.2*  --  3.6 3.4* 3.5 4.0  CL 105  --  101 107 108 105  CO2 26  --  25 27 27 28   GLUCOSE 121*  --  140* 122* 117* 93  BUN 9  --  7 <5* 8 9  CREATININE 0.89  --  0.78 0.79 0.95 0.80  CALCIUM 9.1  --  8.9 8.7* 8.7* 9.1  MG  --  1.8 1.7  --  1.6*  --     GFR: Estimated Creatinine Clearance: 146.7 mL/min (by C-G formula based on SCr of 0.8 mg/dL). Liver Function Tests: Recent Labs  Lab 10/14/21 2303 10/15/21 0642 10/16/21 0353 10/17/21 0034 10/18/21 0105  AST 345* 291* 317* 623* 1,166*  ALT 690* 664* 542* 788* 1,238*  ALKPHOS 219* 187* 152* 173* 202*  BILITOT 1.2 1.0 1.0 0.6 0.8  PROT 6.7 6.3* 5.3* 5.7* 5.9*  ALBUMIN 3.6 3.2* 2.7* 2.9* 3.0*    Recent Labs  Lab 10/14/21 2303  LIPASE 29    No results for input(s): "AMMONIA" in the last 168 hours. Coagulation Profile: Recent Labs  Lab 10/15/21 0642  INR 1.1    Cardiac Enzymes: Recent Labs  Lab 10/15/21 0142  CKTOTAL 232    BNP (last 3 results) No results for input(s): "PROBNP" in the last 8760 hours. HbA1C: No  results for input(s): "HGBA1C" in the last 72 hours. CBG: Recent Labs  Lab 10/15/21 0000  GLUCAP 110*    Lipid Profile: No results for input(s): "CHOL", "HDL", "LDLCALC", "TRIG", "CHOLHDL", "LDLDIRECT" in the last 72 hours. Thyroid Function Tests: No results for input(s): "TSH", "T4TOTAL", "FREET4", "T3FREE", "THYROIDAB" in the last 72 hours. Anemia Panel: No results for input(s): "VITAMINB12", "FOLATE", "FERRITIN", "TIBC", "IRON", "RETICCTPCT" in the last 72 hours. Sepsis Labs: Recent Labs  Lab 10/15/21 0142 10/15/21 12/16/21  LATICACIDVEN 0.9 2.1*     No results found for this or any previous visit (from the past 240 hour(s)).    Radiology Studies: 1025 Abdomen Limited RUQ (LIVER/GB)  Result Date: 10/17/2021 CLINICAL DATA:  Transaminitis. EXAM: ULTRASOUND ABDOMEN LIMITED RIGHT UPPER QUADRANT COMPARISON:  CT abdomen pelvis dated October 13, 2020. FINDINGS: Gallbladder: Contracted. No gallstones or wall thickening visualized. No sonographic Murphy sign noted by sonographer. Common bile duct: Diameter: 3  mm, normal. Liver: No focal lesion identified. Within normal limits in parenchymal echogenicity. Portal vein is patent on color Doppler imaging with normal direction of blood flow towards the liver. Other: None. IMPRESSION: 1. Normal right upper quadrant ultrasound. Electronically Signed   By: Obie Dredge M.D.   On: 10/17/2021 16:30      Scheduled Meds:  busPIRone  5 mg Oral Daily   gabapentin  300 mg Oral Daily   nicotine  14 mg Transdermal Daily   OLANZapine  10 mg Oral Daily   sertraline  50 mg Oral Daily   Continuous Infusions:   LOS: 3 days   Hughie Closs, MD [facility] 10/18/2021, 9:28 AM   Available via Epic secure chat 7am-7pm After these hours, please refer to coverage provider listed on [email or web address]

## 2021-10-18 NOTE — Progress Notes (Signed)
Dr. Loney Loh paged.  The patient is complaining of 7/10 stabbing pain in his LLQ and 7/10 aching pain in his BLE.  States BLE pain is chronic.  No current prn orders for pain.  IV toradol one time dose ordered.  It worked very well with patient resting comfortably on pain reassessment.

## 2021-10-18 NOTE — Progress Notes (Signed)
Overnight progress note  Notified by RN that patient is complaining of chronic bilateral leg pain and ongoing left lower quadrant abdominal pain since the time of admission.  Per RN, abdominal exam benign, not vomiting, and having bowel movements.  Fentanyl was discontinued during dayshift and he is on Suboxone.  I have ordered a dose of Toradol.  Avoid Tylenol given elevated liver enzymes.  Continue to monitor closely.

## 2021-10-19 DIAGNOSIS — T5491XA Toxic effect of unspecified corrosive substance, accidental (unintentional), initial encounter: Secondary | ICD-10-CM | POA: Diagnosis not present

## 2021-10-19 LAB — COMPREHENSIVE METABOLIC PANEL
ALT: 1887 U/L — ABNORMAL HIGH (ref 0–44)
AST: 1713 U/L — ABNORMAL HIGH (ref 15–41)
Albumin: 2.8 g/dL — ABNORMAL LOW (ref 3.5–5.0)
Alkaline Phosphatase: 260 U/L — ABNORMAL HIGH (ref 38–126)
Anion gap: 8 (ref 5–15)
BUN: 14 mg/dL (ref 6–20)
CO2: 29 mmol/L (ref 22–32)
Calcium: 9.2 mg/dL (ref 8.9–10.3)
Chloride: 104 mmol/L (ref 98–111)
Creatinine, Ser: 0.72 mg/dL (ref 0.61–1.24)
GFR, Estimated: >60 mL/min (ref >60)
Glucose, Bld: 103 mg/dL — ABNORMAL HIGH (ref 70–99)
Potassium: 4.1 mmol/L (ref 3.5–5.1)
Sodium: 141 mmol/L (ref 135–145)
Total Bilirubin: 0.8 mg/dL (ref 0.3–1.2)
Total Protein: 5.6 g/dL — ABNORMAL LOW (ref 6.5–8.1)

## 2021-10-19 MED ORDER — KETOROLAC TROMETHAMINE 15 MG/ML IJ SOLN
15.0000 mg | Freq: Once | INTRAMUSCULAR | Status: AC | PRN
  Administered 2021-10-19: 15 mg via INTRAVENOUS
  Filled 2021-10-19: qty 1

## 2021-10-19 MED ORDER — KETOROLAC TROMETHAMINE 15 MG/ML IJ SOLN
15.0000 mg | Freq: Once | INTRAMUSCULAR | Status: AC | PRN
Start: 2021-10-19 — End: 2021-10-19
  Administered 2021-10-19: 15 mg via INTRAVENOUS
  Filled 2021-10-19: qty 1

## 2021-10-19 NOTE — Progress Notes (Signed)
PROGRESS NOTE    Cody Poole  WUJ:[id number] DOB: October 23, 1995 DOA: 10/14/2021 PCP: [facility]   Brief Narrative:  Cody Poole is a 26 y.o. male with medical history significant for paranoid schizophrenia, polysubstance abuse, GAD, who is admitted to [facility] on 10/14/2021 with acute bleach ingestion after presenting from home to [facility] ED complaining of abdominal pain.    The patient reports that he ingested a "swig" of bleach earlier on 10/14/2021 in order to "clear out his insides".  He conveys that this was done with therapeutic intent, denying any suicidal or homicidal ideations.  Denies ingestion of any additional household items over the last day, nor any recent recreational drug use.  Denies any concomitant alcohol consumption.   Following bleach ingestion, the patient reports development of mild nonradiating epigastric discomfort, which he describes as sharp in nature.  States that the pain has not been intensifying since onset.  Reports some associated nausea in the absence of any vomiting.    Per poison control recommendations, patient was started on N-acetylcysteine and monitor closely.  He was involuntarily committed in the emergency department.  Sitter was ordered at bedside due to his flight risk.  Assessment & Plan:   Principal Problem:   Ingestion of bleach Active Problems:   Paranoid schizophrenia (HCC)   Transaminitis   Wandering atrial pacemaker   Hypokalemia   Anxiety   Acute toxic hepatitis  Intentional bleach ingestion:  -At poison control's recommendation, patient was started on N-acetylcysteine at time of admission.  Has now been cleared by poison control and N-acetylcysteine was discontinued. -With his history of paranoid schizophrenia, patient was IVC'd in the emergency department due to trying to flee the hospital.  Ophelia Charter was ordered for bedside -Psychiatry consulted, cleared for discharge -IVC and sitter discontinued per psychiatry  recommendations. -Tolerating regular diet -Continue home olanzapine, gabapentin   Generalized anxiety disorder -Continue Zoloft and BuSpar   Acute toxic transaminitis -Hepatitis C antibody positive Rising LFTs again today but patient remains asymptomatic.  Likely due to bleach intake.  Ultrasound abdomen negative.  We will continue to keep in the hospital for observation until LFTs play to and then start to improve.  Patient understands the rationale of staying in the hospital.   Wandering atrial pacemaker -EDP discussed with cardiology on-call, conveyed that no additional evaluation is necessary at this time   Polysubstance abuse -UDS positive for amphetamine, opiate, THC -Nicotine patch   Chronic opioid dependence: Fentanyl discontinued, Suboxone resumed.  We will avoid opioids.  Chronic body aches/bilateral lower extremity pain: Patient has this chronic body aches.  This gets better with Toradol.  To avoid side effects of NSAIDs, we are ordering it one-time doses as needed.  Unable to give him Tylenol due to elevated LFTs/toxic hepatitis.  DVT prophylaxis: SCDs Start: 10/15/21 9562   Code Status: Full Code  Family Communication:  None present at bedside.  Plan of care discussed with patient in length and he/she verbalized understanding and agreed with it.  Status is: Inpatient Remains inpatient appropriate because: Elevated and rising LFTs/toxic hepatitis   Estimated body mass index is 21.38 kg/m as calculated from the following:   Height as of this encounter: 6\' 1"  (1.854 m).   Weight as of this encounter: 73.5 kg.    Nutritional Assessment: Body mass index is 21.38 kg/m. Seen by dietician.  I agree with the assessment and plan as outlined below: Nutrition Status: Nutrition Problem: Increased nutrient needs Etiology: acute illness Signs/Symptoms: estimated needs  Interventions: MVI, Refer to RD note for recommendations  . Skin Assessment: I have examined the  patient's skin and I agree with the wound assessment as performed by the wound care RN as outlined below:    Consultants:  Psychiatry  Procedures:  None  Antimicrobials:  Anti-infectives (From admission, onward)    None         Subjective: Seen and examined.  Complains of body ache which is chronic.  No other complaint.  No abdominal pain.  Objective: Vitals:   10/18/21 2037 10/18/21 2356 10/19/21 0331 10/19/21 0833  BP: 129/89 121/62 119/70 123/85  Pulse: 60 70 62 (!) 55  Resp: 18 18 16 16   Temp: 98.4 F (36.9 C) 98.2 F (36.8 C) 97.9 F (36.6 C) 98 F (36.7 C)  TempSrc: Oral Oral Oral Oral  SpO2: 98% 98% 96%   Weight:      Height:       No intake or output data in the 24 hours ending 10/19/21 1128 Filed Weights   10/14/21 2112 10/17/21 0213  Weight: 81.6 kg 73.5 kg    Examination:  General exam: Appears calm and comfortable  Respiratory system: Clear to auscultation. Respiratory effort normal. Cardiovascular system: S1 & S2 heard, RRR. No JVD, murmurs, rubs, gallops or clicks. No pedal edema. Gastrointestinal system: Abdomen is nondistended, soft and nontender. No organomegaly or masses felt. Normal bowel sounds heard. Central nervous system: Alert and oriented. No focal neurological deficits. Extremities: Symmetric 5 x 5 power. Skin: No rashes, lesions or ulcers Psychiatry: Judgement and insight appear poor.  Data Reviewed: I have personally reviewed following labs and imaging studies  CBC: Recent Labs  Lab 10/14/21 2303 10/15/21 0642  WBC 6.2 5.7  NEUTROABS  --  2.7  HGB 12.8* 14.4  HCT 40.5 44.9  MCV 86.7 86.0  PLT 282 291   Basic Metabolic Panel: Recent Labs  Lab 10/15/21 0142 10/15/21 0642 10/16/21 0353 10/17/21 0034 10/18/21 0105 10/19/21 0228  NA  --  140 141 140 140 141  K  --  3.6 3.4* 3.5 4.0 4.1  CL  --  101 107 108 105 104  CO2  --  25 27 27 28 29   GLUCOSE  --  140* 122* 117* 93 103*  BUN  --  7 <5* 8 9 14   CREATININE   --  0.78 0.79 0.95 0.80 0.72  CALCIUM  --  8.9 8.7* 8.7* 9.1 9.2  MG 1.8 1.7  --  1.6*  --   --    GFR: Estimated Creatinine Clearance: 146.7 mL/min (by C-G formula based on SCr of 0.72 mg/dL). Liver Function Tests: Recent Labs  Lab 10/15/21 0642 10/16/21 0353 10/17/21 0034 10/18/21 0105 10/19/21 0228  AST 291* 317* 623* 1,166* 1,713*  ALT 664* 542* 788* 1,238* 1,887*  ALKPHOS 187* 152* 173* 202* 260*  BILITOT 1.0 1.0 0.6 0.8 0.8  PROT 6.3* 5.3* 5.7* 5.9* 5.6*  ALBUMIN 3.2* 2.7* 2.9* 3.0* 2.8*   Recent Labs  Lab 10/14/21 2303  LIPASE 29   No results for input(s): "AMMONIA" in the last 168 hours. Coagulation Profile: Recent Labs  Lab 10/15/21 0642  INR 1.1   Cardiac Enzymes: Recent Labs  Lab 10/15/21 0142  CKTOTAL 232   BNP (last 3 results) No results for input(s): "PROBNP" in the last 8760 hours. HbA1C: No results for input(s): "HGBA1C" in the last 72 hours. CBG: Recent Labs  Lab 10/15/21 0000  GLUCAP 110*   Lipid Profile: No results for  input(s): "CHOL", "HDL", "LDLCALC", "TRIG", "CHOLHDL", "LDLDIRECT" in the last 72 hours. Thyroid Function Tests: No results for input(s): "TSH", "T4TOTAL", "FREET4", "T3FREE", "THYROIDAB" in the last 72 hours. Anemia Panel: No results for input(s): "VITAMINB12", "FOLATE", "FERRITIN", "TIBC", "IRON", "RETICCTPCT" in the last 72 hours. Sepsis Labs: Recent Labs  Lab 10/15/21 0142 10/15/21 0814  LATICACIDVEN 0.9 2.1*    No results found for this or any previous visit (from the past 240 hour(s)).   Radiology Studies: US Abdomen Limited RUQ (LIVER/GB)  Result Date: 10/17/2021 CLINICAL DATA:  Transaminitis. EXAM: ULTRASOUND ABDOMEN LIMITED RIGHT UPPER QUADRANT COMPARISON:  CT abdomen pelvis dated October 13, 2020. FINDINGS: Gallbladder: Contracted. No gallstones or wall thickening visualized. No sonographic Murphy sign noted by sonographer. Common bile duct: Diameter: 3 mm, normal. Liver: No focal lesion identified. Within  normal limits in parenchymal echogenicity. Portal vein is patent on color Doppler imaging with normal direction of blood flow towards the liver. Other: None. IMPRESSION: 1. Normal right upper quadrant ultrasound. Electronically Signed   By: Obie Dredge M.D.   On: 10/17/2021 16:30    Scheduled Meds:  buprenorphine-naloxone  1 tablet Sublingual Daily   busPIRone  5 mg Oral Daily   gabapentin  300 mg Oral Daily   nicotine  14 mg Transdermal Daily   OLANZapine  10 mg Oral Daily   sertraline  50 mg Oral Daily   Continuous Infusions:   LOS: 4 days   Hughie Closs, MD [facility]  10/19/2021, 11:28 AM   *Please note that this is a verbal dictation therefore any spelling or grammatical errors are due to the "Dragon Medical One" system interpretation.  Please page via Amion and do not message via secure chat for urgent patient care matters. Secure chat can be used for non urgent patient care matters.  How to contact the [facility] Attending or Consulting provider 7A - 7P or covering provider during after hours 7P -7A, for this patient?  Check the care team in [facility] and look for a) attending/consulting TR[facility] provider listed and b) the [facility] team listed. Page or secure chat 7A-7P. Log into [email or web address] and use [facility] universal password to access. If you do not have the password, please contact the hospital operator. Locate the [facility] provider you are looking for under [facility] and page to a number that you can be directly reached. If you still have difficulty reaching the provider, please page the [facility] (Director on Call) for the Hospitalists listed on amion for assistance.

## 2021-10-19 NOTE — Progress Notes (Signed)
Pt reports 7/10 pain and says toradol works best in past; no active PRN for toradol on MAR. Will page MD for input.

## 2021-10-20 DIAGNOSIS — T6591XA Toxic effect of unspecified substance, accidental (unintentional), initial encounter: Secondary | ICD-10-CM

## 2021-10-20 DIAGNOSIS — T5491XA Toxic effect of unspecified corrosive substance, accidental (unintentional), initial encounter: Secondary | ICD-10-CM | POA: Diagnosis not present

## 2021-10-20 DIAGNOSIS — R748 Abnormal levels of other serum enzymes: Secondary | ICD-10-CM

## 2021-10-20 DIAGNOSIS — K712 Toxic liver disease with acute hepatitis: Secondary | ICD-10-CM

## 2021-10-20 LAB — COMPREHENSIVE METABOLIC PANEL
ALT: 2490 U/L — ABNORMAL HIGH (ref 0–44)
AST: 2188 U/L — ABNORMAL HIGH (ref 15–41)
Albumin: 3.2 g/dL — ABNORMAL LOW (ref 3.5–5.0)
Alkaline Phosphatase: 246 U/L — ABNORMAL HIGH (ref 38–126)
Anion gap: 6 (ref 5–15)
BUN: 16 mg/dL (ref 6–20)
CO2: 27 mmol/L (ref 22–32)
Calcium: 9.3 mg/dL (ref 8.9–10.3)
Chloride: 101 mmol/L (ref 98–111)
Creatinine, Ser: 0.76 mg/dL (ref 0.61–1.24)
GFR, Estimated: >60 mL/min (ref >60)
Glucose, Bld: 91 mg/dL (ref 70–99)
Potassium: 5 mmol/L (ref 3.5–5.1)
Sodium: 134 mmol/L — ABNORMAL LOW (ref 135–145)
Total Bilirubin: 1.6 mg/dL — ABNORMAL HIGH (ref 0.3–1.2)
Total Protein: 6.4 g/dL — ABNORMAL LOW (ref 6.5–8.1)

## 2021-10-20 LAB — CBC WITH DIFFERENTIAL/PLATELET
Abs Immature Granulocytes: 0 10*3/uL (ref 0.00–0.07)
Basophils Absolute: 0.1 10*3/uL (ref 0.0–0.1)
Basophils Relative: 1 %
Eosinophils Absolute: 0.1 10*3/uL (ref 0.0–0.5)
Eosinophils Relative: 2 %
HCT: 40 % (ref 39.0–52.0)
Hemoglobin: 12.8 g/dL — ABNORMAL LOW (ref 13.0–17.0)
Lymphocytes Relative: 37 %
Lymphs Abs: 2.1 10*3/uL (ref 0.7–4.0)
MCH: 27.5 pg (ref 26.0–34.0)
MCHC: 32 g/dL (ref 30.0–36.0)
MCV: 85.8 fL (ref 80.0–100.0)
Monocytes Absolute: 0.3 10*3/uL (ref 0.1–1.0)
Monocytes Relative: 6 %
Neutro Abs: 3.1 10*3/uL (ref 1.7–7.7)
Neutrophils Relative %: 54 %
Platelets: 321 10*3/uL (ref 150–400)
RBC: 4.66 MIL/uL (ref 4.22–5.81)
RDW: 14.7 % (ref 11.5–15.5)
WBC: 5.7 10*3/uL (ref 4.0–10.5)
nRBC: 0 % (ref 0.0–0.2)
nRBC: 0 /100 WBC

## 2021-10-20 LAB — BILIRUBIN, DIRECT: Bilirubin, Direct: 1 mg/dL — ABNORMAL HIGH (ref 0.0–0.2)

## 2021-10-20 LAB — PROTIME-INR
INR: 1.1 (ref 0.8–1.2)
Prothrombin Time: 14.2 seconds (ref 11.4–15.2)

## 2021-10-20 LAB — FERRITIN: Ferritin: 1217 ng/mL — ABNORMAL HIGH (ref 24–336)

## 2021-10-20 LAB — IRON: Iron: 116 ug/dL (ref 45–182)

## 2021-10-20 MED ORDER — KETOROLAC TROMETHAMINE 15 MG/ML IJ SOLN
15.0000 mg | Freq: Once | INTRAMUSCULAR | Status: AC | PRN
Start: 2021-10-20 — End: 2021-10-20
  Administered 2021-10-20: 15 mg via INTRAVENOUS
  Filled 2021-10-20: qty 1

## 2021-10-20 MED ORDER — PANTOPRAZOLE SODIUM 40 MG PO TBEC
40.0000 mg | DELAYED_RELEASE_TABLET | Freq: Every day | ORAL | Status: DC
  Administered 2021-10-20 – 2021-10-21 (×2): 40 mg via ORAL
  Filled 2021-10-20 (×2): qty 1

## 2021-10-20 NOTE — Progress Notes (Signed)
Patient and family at bedside concerned about what the plan is since his AST, ALT labs continue to increase.

## 2021-10-20 NOTE — Consult Note (Addendum)
Attending physician's note   I have taken a history, reviewed the chart, and examined the patient. I performed a substantive portion of this encounter, including complete performance of at least one of the key components, in conjunction with the APP. I agree with the APP's note, impression, and recommendations with my edits.   26 year old male with medical history as outlined below to include history of paranoid schizophrenia, polysubstance abuse, IV drug use, admitted on 7/4 with abdominal pain after drinking bleach for "therapeutic intent" as a form of bowel cleanse.  Was treated with NAC on admission.  GI service consulted for elevated liver enzymes.  Does admit to recent/active polysubstance abuse to include heroin, methamphetamine.  Does report a known history of hepatitis C, diagnosed approximately 1.5 years ago.  Had been previously started on therapy, but he says he is just stopped taking it.  No prior known history of liver disease.  No known family history of liver disease, although does not know much of his father's family history.  Liver enzymes were normal on 09/23/2021, then mildly elevated on 10/08/2021 (AST/ALT 79/92, ALP 71, T. bili 0.9).  Liver enzymes have continued to uptrend now with AST/ALT 2188/2490, ALP 246, T. bili 1.6 (from 0.8). - Normal/negative salicylates, APAP, EtOH, CK on arrival - UDS + for cocaine, THC.  Recently also positive for amphetamines 2 months ago - HCV Ab+ - Negative/normal hepatitis A, B - RUQ [place] with Doppler: Normal with normal flow  1) Elevated liver enzymes - Discussed full DDx with patient and family member at bedside.  High suspicion for DILI/ingestion as culprit.  APAP/salicylates negative.  No e/o clot on RUQ [place] with Doppler.  Will complete work-up to exclude acute viral hepatitis, Wilson disease. ? Ischemic injury 2/2 cocaine - Continue trending daily enzymes - Expect to see bilirubin continues to uptrend over the coming days even if AST/ALT  start to downtrend - Otherwise no e/o impaired hepatic synthetic function; good mentation, albumin 3.2, preserved renal function, normal PLT, normal INR - Check HEV, HSV, CMV, ceruloplasmin - Complete cessation of all EtOH  2) Hepatitis C - Diagnoses known to the patient for the last 1.5 years - Defer treatment to outpatient setting pending patient's ability to demonstrate compliance  3) Polysubstance abuse - Strongly counseled patient on complete cessation or continued use would certainly lead to his hastened demise - Agree with recommendation for inpatient drug rehab  GI service will continue to follow  Gerrit Heck, DO, FACG [phone] office          Referring Provider: Dr. Darliss Cheney Primary Care Physician:  Pc, [place] Primary Gastroenterologist:  Althia Forts   Reason for Consultation: Elevated LFTs, bili  HPI: Cody Poole is a 26 y.o. male with a past medical history significant for anxiety, depression, schizophrenia, hypertension PTSD, polysubstance abuse/IV drug use, lower back pain and homelessness.  He presented to [facility] ED with abdominal pain which occurred after drinking bleach to clean out his insides as he stated he thought he had worms in his stomach.  Involuntarily admitted to the ED.  Admission LFTs: Total bili 1.2.  Alk phos 219.  AST 345.  ALT of 690.  No alcohol < 10.  Salicylate level < 7.0.  Acetaminophen level normal < 10.  Lipase 29.  Hep C antibody reactive.  Hepatitis B surface antigen nonreactive.  Hepatitis B core IgM nonreactive.  Hepatitis A IgM nonreactive.  WBC 6.2.  Hemoglobin 12.8.  Platelet 282.  CK 232.  INR 1.1.  Urine drug screen positive for opiates, amphetamines and THC. RUQ sonogram 10/17/2021 was negative, patent portal vein.  Per poison control recommendations, patient was started on N-acetylcysteine x 5 hours on 10/15/2021 then was discontinued.   His LFTs continued to increase therefore a GI  consult was requested for further evaluation.  10/16/2021: Total bili 1.0.  Alk phos 152.  AST 317.   ALT 542. 10/17/2021: Total bili 0.6.  Alk phos 173.  AST 623.   ALT 788.  He reported testing positive for hepatitis C antibody when he was admitted to the behavioral health hospital 6 months ago.  He continues to use methamphetamines and heroin IV daily for the past 1 to 2 years.  Last IV drug use was 1 week ago.  He drinks one 40 ounce beer once or twice weekly.  He smokes marijuana since the age of 67.  He smokes less than 1/2 pack of cigarettes daily since age 44.  He denies excessive acetaminophen use.  He drank 1 swig of bleach on 10/14/2021 because he thought he had worms in his stomach.  He denied suicidal ideation.  He vomited nonbloody emesis x 2 and developed LUQ pain.  He is enrolled in an outpatient rehab in [place] through [place] involves once monthly meetings.  He has been on Suboxone on and off for the past 2 years.  His appetite is good.  No weight loss.  No fever, sweats or chills.  No GERD symptoms.  His bowel movements are normal, no rectal bleeding or black stools.  No known family history of liver disease.  No new medications within the past 3 to 4 months. He remains on Suboxone, Klonopin, Neurontin, Zyprexa, Zoloft and Buspirone as prescribed  His fiance Tammy is at the bedside     Latest Ref Rng & Units 10/20/2021   12:57 AM 10/19/2021    2:28 AM 10/18/2021    1:05 AM  Hepatic Function  Total Protein 6.5 - 8.1 g/dL 6.4  5.6  5.9   Albumin 3.5 - 5.0 g/dL 3.2  2.8  3.0   AST 15 - 41 U/L 2,188  1,713  1,166   ALT 0 - 44 U/L 2,490  1,887  1,238   Alk Phosphatase 38 - 126 U/L 246  260  202   Total Bilirubin 0.3 - 1.2 mg/dL 1.6  0.8  0.8       Past Medical History:  Diagnosis Date   Anxiety    Back pain    Depression    Hypertension    Paranoid schizophrenia ([place])     History reviewed. No pertinent surgical history.  Prior to Admission medications    Medication Sig Start Date End Date Taking? Authorizing Provider  buprenorphine-naloxone (SUBOXONE) 8-2 mg SUBL SL tablet Place 1 tablet under the tongue in the morning and at bedtime.   Yes [provider]  clonazePAM (KLONOPIN) 1 MG tablet Take 1 mg by mouth daily.   Yes [provider]  gabapentin (NEURONTIN) 300 MG capsule Take 300 mg by mouth daily.   Yes [provider]  OLANZapine (ZYPREXA) 10 MG tablet Take 10 mg by mouth daily.   Yes [provider]  sertraline (ZOLOFT) 50 MG tablet Take 50 mg by mouth daily.   Yes [provider]  BUSPIRONE HCL PO Take 1 tablet by mouth daily.    [provider]    Current Facility-Administered Medications  Medication Dose Route Frequency Provider  Last Rate Last Admin   buprenorphine-naloxone (SUBOXONE) 8-2 mg per SL tablet 1 tablet  1 tablet Sublingual Daily Pahwani, Einar Grad, MD   1 tablet at 10/20/21 1017   busPIRone (BUSPAR) tablet 5 mg  5 mg Oral Daily Howerter, Justin B, DO   5 mg at 10/20/21 1017   gabapentin (NEURONTIN) capsule 300 mg  300 mg Oral Daily Howerter, Justin B, DO   300 mg at 10/20/21 1017   naloxone (NARCAN) injection 0.4 mg  0.4 mg Intravenous PRN Howerter, Justin B, DO       nicotine (NICODERM CQ - dosed in mg/24 hours) patch 14 mg  14 mg Transdermal Daily Dessa Phi, DO   14 mg at 10/20/21 1028   OLANZapine (ZYPREXA) tablet 10 mg  10 mg Oral Daily Howerter, Justin B, DO   10 mg at 10/20/21 1017   ondansetron (ZOFRAN) injection 4 mg  4 mg Intravenous Q6H PRN Howerter, Justin B, DO       sertraline (ZOLOFT) tablet 50 mg  50 mg Oral Daily Howerter, Justin B, DO   50 mg at 10/20/21 1017    Allergies as of 10/14/2021 - Review Complete 10/14/2021  Allergen Reaction Noted   Haldol [haloperidol] Other (See Comments) 05/12/2021    Family History  Family history unknown: Yes    Social History   Socioeconomic History   Marital status: Single    Spouse name: Not on file    Number of children: Not on file   Years of education: Not on file   Highest education level: Not on file  Occupational History   Not on file  Tobacco Use   Smoking status: Every Day   Smokeless tobacco: Never  Vaping Use   Vaping Use: Never used  Substance and Sexual Activity   Alcohol use: No   Drug use: Yes    Types: IV, Cocaine    Comment: Last use 05/09/20   Sexual activity: Not on file    Comment: heroin, crack cocaine  Other Topics Concern   Not on file  Social History Narrative   Not on file   Social Determinants of Health   Financial Resource Strain: Not on file  Food Insecurity: Not on file  Transportation Needs: Not on file  Physical Activity: Not on file  Stress: Not on file  Social Connections: Not on file  Intimate Partner Violence: Not on file    Review of Systems: Gen: Denies fever, sweats or chills. No weight loss.  CV: Denies chest pain, palpitations or edema. Resp: Denies cough, shortness of breath of hemoptysis.  GI: See HPI. GU : Denies urinary burning, blood in urine, increased urinary frequency or incontinence. MS: Denies joint pain, muscles aches or weakness. Derm: Denies rash, itchiness, skin lesions or unhealing ulcers. Psych: See HPI. Heme: Denies easy bruising, bleeding. Neuro:  Denies headaches, dizziness or paresthesias. Endo:  Denies any problems with DM, thyroid or adrenal function.  Physical Exam: Vital signs in last 24 hours: Temp:  [97.5 F (36.4 C)-98 F (36.7 C)] 97.7 F (36.5 C) (07/10 0800) Pulse Rate:  [51-72] 72 (07/10 0800) Resp:  [16-17] 17 (07/10 0800) BP: (122-147)/(79-106) 136/79 (07/10 0800) SpO2:  [95 %-100 %] 100 % (07/10 0800) Weight:  [75.2 kg] 75.2 kg (07/10 0027) Last BM Date : 10/16/21  General:  Alert 26 year old male in no acute distress. Head:  Normocephalic and atraumatic. Eyes:  No scleral icterus. Conjunctiva pink. Ears:  Normal auditory acuity. Nose:  No deformity, discharge or  lesions. Mouth:   Dentition intact. No ulcers or lesions.  Neck:  Supple. No lymphadenopathy or thyromegaly.  Lungs: Breath sounds clear. Heart: Regular rate and rhythm, no murmurs. Abdomen: Soft, nondistended.  Positive bowel sounds to all 4 quadrants.  No hepatosplenomegaly.  No palpable mass. Rectal: Deferred. Musculoskeletal:  Symmetrical without gross deformities.  Pulses:  Normal pulses noted. Extremities:  Without clubbing or edema. Neurologic:  Alert and  oriented x 4. No focal deficits.  Skin:  Intact without significant lesions or rashes.  ? Developing mild jaundice.  Numerous tattoos. Psych:  Alert and cooperative. Normal mood and affect.  Intake/Output from previous day: No intake/output data recorded. Intake/Output this shift: No intake/output data recorded.  Lab Results: No results for input(s): "WBC", "HGB", "HCT", "PLT" in the last 72 hours. BMET Recent Labs    10/18/21 0105 10/19/21 0228 10/20/21 0057  NA 140 141 134*  K 4.0 4.1 5.0  CL 105 104 101  CO2 _0 GLUCOSE 93 103* 91  BUN _1 CREATININE 0.80 0.72 0.76  CALCIUM 9.1 9.2 9.3   LFT Recent Labs    10/20/21 0057  PROT 6.4*  ALBUMIN 3.2*  AST 2,188*  ALT 2,490*  ALKPHOS 246*  BILITOT 1.6*   PT/INR No results for input(s): "LABPROT", "INR" in the last 72 hours. Hepatitis Panel No results for input(s): "HEPBSAG", "HCVAB", "HEPAIGM", "HEPBIGM" in the last 72 hours.    Studies/Results: No results found.  IMPRESSION/PLAN:  48) 26 year old male with poly substance abuse admitted to the hospital 10/14/2021 with N/V, LUQ pain after ingesting bleach with elevated LFTs.  Rising LFTs, today total bili 1.6.  Alk phos 246. AST 2188.  ALT 2490 . Hep C antibody positive.  RUQ sonogram showed a patent portal vein without any concerning liver lesions.  He received N-acetylcysteine x 5 hours on 7/5 then was discontinued. Elevated LFTs multifactorial: polysubstance abuse, possible DILI and chronic hep C.  Afebrile.   Hemodynamically stable. -INR, Hep C RNA quant, IgG, ANA, SMA, AMA, ceruloplasmin, iron, ferritin and A1AT -? Abdominal MRI/MRCP, patient stated he could tolerate MRI.  -? Liver doppler -Strongly encouraged inpatient drug rehab  -Patient counseled no alcohol or drug use -Await Hep C RNA and genotype -No narcotics, limit NSAIDs -Continue Ondansetron 4 mg p.o. or IV every 6 hours as needed -Pantoprazole 40 mg 1 p.o. daily -Diet as tolerated -Await further recommendations per Dr. Bryan Lemma  2) History of Paranoid schizophrenia on multiple psychotropic medications.  Bedside sitter was discontinued.  3) Wandering atrial pacemaker, no further evaluation per cardiology    Noralyn Pick  10/20/2021, 12:15PM

## 2021-10-20 NOTE — Progress Notes (Signed)
PROGRESS NOTE    Cody Poole  ZDG:[id number] DOB: July 22, 1995 DOA: 10/14/2021 PCP: [facility]   Brief Narrative:  Cody Poole is a 26 y.o. male with medical history significant for paranoid schizophrenia, polysubstance abuse, GAD, who is admitted to [facility] on 10/14/2021 with acute bleach ingestion after presenting from home to [facility] ED complaining of abdominal pain.    The patient reports that he ingested a "swig" of bleach earlier on 10/14/2021 in order to "clear out his insides".  He conveys that this was done with therapeutic intent, denying any suicidal or homicidal ideations.  Denies ingestion of any additional household items over the last day, nor any recent recreational drug use.  Denies any concomitant alcohol consumption.   Following bleach ingestion, the patient reports development of mild nonradiating epigastric discomfort, which he describes as sharp in nature.  States that the pain has not been intensifying since onset.  Reports some associated nausea in the absence of any vomiting.    Per poison control recommendations, patient was started on N-acetylcysteine and monitor closely.  He was involuntarily committed in the emergency department.  Sitter was ordered at bedside due to his flight risk.  Assessment & Plan:   Principal Problem:   Ingestion of bleach Active Problems:   Paranoid schizophrenia (HCC)   Transaminitis   Wandering atrial pacemaker   Hypokalemia   Anxiety   Acute toxic hepatitis  Intentional bleach ingestion:  -At poison control's recommendation, patient was started on N-acetylcysteine at time of admission.  Has now been cleared by poison control and N-acetylcysteine was discontinued. -With his history of paranoid schizophrenia, patient was IVC'd in the emergency department due to trying to flee the hospital.  Ophelia Charter was ordered for bedside -Psychiatry consulted, cleared for discharge -IVC and sitter discontinued per psychiatry  recommendations. -Tolerating regular diet -Continue home olanzapine, gabapentin   Generalized anxiety disorder -Continue Zoloft and BuSpar   Acute toxic transaminitis -Hepatitis C antibody positive Rising LFTs again today but patient remains asymptomatic.  Likely due to bleach intake.  Ultrasound abdomen negative.  We will continue to keep in the hospital for observation until LFTs play to and then start to improve.  However since his LFTs have been rising significantly every day on a steady basis, I have consulted GI for their opinion.   Wandering atrial pacemaker -EDP discussed with cardiology on-call, conveyed that no additional evaluation is necessary at this time   Polysubstance abuse -UDS positive for amphetamine, opiate, THC -Nicotine patch   Chronic opioid dependence: Fentanyl discontinued, Suboxone resumed.  We will avoid opioids.  Chronic body aches/bilateral lower extremity pain: Patient has this chronic body aches.  This gets better with Toradol.  To avoid side effects of NSAIDs, we are ordering it one-time doses as needed.  Unable to give him Tylenol due to elevated LFTs/toxic hepatitis.  DVT prophylaxis: SCDs Start: 10/15/21 5956   Code Status: Full Code  Family Communication: Patient's significant other present at bedside.  Plan of care discussed with patient in length and he/she verbalized understanding and agreed with it.  Status is: Inpatient Remains inpatient appropriate because: Elevated and rising LFTs/toxic hepatitis   Estimated body mass index is 21.87 kg/m as calculated from the following:   Height as of this encounter: 6\' 1"  (1.854 m).   Weight as of this encounter: 75.2 kg.    Nutritional Assessment: Body mass index is 21.87 kg/m. Seen by dietician.  I agree with the assessment and plan as outlined below:  Nutrition Status: Nutrition Problem: Increased nutrient needs Etiology: acute illness Signs/Symptoms: estimated needs Interventions: MVI, Refer  to RD note for recommendations  . Skin Assessment: I have examined the patient's skin and I agree with the wound assessment as performed by the wound care RN as outlined below:    Consultants:  Psychiatry  Procedures:  None  Antimicrobials:  Anti-infectives (From admission, onward)    None         Subjective: Patient seen and examined.  No new complaint other than generalized body ache which gets better with intermittent Toradol.  We are trying to avoid opioids.  Objective: Vitals:   10/20/21 0100 10/20/21 0500 10/20/21 0800 10/20/21 1158  BP: (!) 147/106 135/86 136/79 137/78  Pulse: 62 (!) 55 72 64  Resp: 17 17 17 17   Temp: (!) 97.5 F (36.4 C) 97.9 F (36.6 C) 97.7 F (36.5 C) 97.8 F (36.6 C)  TempSrc: Oral Oral Oral Oral  SpO2: 97% 95% 100% 99%  Weight:      Height:       No intake or output data in the 24 hours ending 10/20/21 1215 Filed Weights   10/14/21 2112 10/17/21 0213 10/20/21 0027  Weight: 81.6 kg 73.5 kg 75.2 kg    Examination:  General exam: Appears calm and comfortable  Respiratory system: Clear to auscultation. Respiratory effort normal. Cardiovascular system: S1 & S2 heard, RRR. No JVD, murmurs, rubs, gallops or clicks. No pedal edema. Gastrointestinal system: Abdomen is nondistended, soft and nontender. No organomegaly or masses felt. Normal bowel sounds heard. Central nervous system: Alert and oriented. No focal neurological deficits. Extremities: Symmetric 5 x 5 power. Skin: No rashes, lesions or ulcers.  Psychiatry: Judgement and insight poor  Data Reviewed: I have personally reviewed following labs and imaging studies  CBC: Recent Labs  Lab 10/14/21 2303 10/15/21 0642  WBC 6.2 5.7  NEUTROABS  --  2.7  HGB 12.8* 14.4  HCT 40.5 44.9  MCV 86.7 86.0  PLT 282 [id number]    Basic Metabolic Panel: Recent Labs  Lab 10/15/21 0142 10/15/21 0642 10/16/21 0353 10/17/21 0034 10/18/21 0105 10/19/21 0228 10/20/21 0057  NA  --  140  141 140 140 141 134*  K  --  3.6 3.4* 3.5 4.0 4.1 5.0  CL  --  101 107 108 105 104 101  CO2  --  25 27 27 28 29 27   GLUCOSE  --  140* 122* 117* 93 103* 91  BUN  --  7 <5* 8 9 14 16   CREATININE  --  0.78 0.79 0.95 0.80 0.72 0.76  CALCIUM  --  8.9 8.7* 8.7* 9.1 9.2 9.3  MG 1.8 1.7  --  1.6*  --   --   --     GFR: Estimated Creatinine Clearance: 150.1 mL/min (by C-G formula based on SCr of 0.76 mg/dL). Liver Function Tests: Recent Labs  Lab 10/16/21 0353 10/17/21 0034 10/18/21 0105 10/19/21 0228 10/20/21 0057  AST 317* 623* 1,166* 1,713* 2,188*  ALT 542* 788* 1,238* 1,887* 2,490*  ALKPHOS 152* 173* 202* 260* 246*  BILITOT 1.0 0.6 0.8 0.8 1.6*  PROT 5.3* 5.7* 5.9* 5.6* 6.4*  ALBUMIN 2.7* 2.9* 3.0* 2.8* 3.2*    Recent Labs  Lab 10/14/21 2303  LIPASE 29    No results for input(s): "AMMONIA" in the last 168 hours. Coagulation Profile: Recent Labs  Lab 10/15/21 0642  INR 1.1    Cardiac Enzymes: Recent Labs  Lab 10/15/21 0142  CKTOTAL 232  BNP (last 3 results) No results for input(s): "PROBNP" in the last 8760 hours. HbA1C: No results for input(s): "HGBA1C" in the last 72 hours. CBG: Recent Labs  Lab 10/15/21 0000  GLUCAP 110*    Lipid Profile: No results for input(s): "CHOL", "HDL", "LDLCALC", "TRIG", "CHOLHDL", "LDLDIRECT" in the last 72 hours. Thyroid Function Tests: No results for input(s): "TSH", "T4TOTAL", "FREET4", "T3FREE", "THYROIDAB" in the last 72 hours. Anemia Panel: No results for input(s): "VITAMINB12", "FOLATE", "FERRITIN", "TIBC", "IRON", "RETICCTPCT" in the last 72 hours. Sepsis Labs: Recent Labs  Lab 10/15/21 0142 10/15/21 7672  LATICACIDVEN 0.9 2.1*     No results found for this or any previous visit (from the past 240 hour(s)).   Radiology Studies: No results found.  Scheduled Meds:  buprenorphine-naloxone  1 tablet Sublingual Daily   busPIRone  5 mg Oral Daily   gabapentin  300 mg Oral Daily   nicotine  14 mg  Transdermal Daily   OLANZapine  10 mg Oral Daily   sertraline  50 mg Oral Daily   Continuous Infusions:   LOS: 5 days   Hughie Closs, MD [facility]  10/20/2021, 12:15 PM   *Please note that this is a verbal dictation therefore any spelling or grammatical errors are due to the "Dragon Medical One" system interpretation.  Please page via Amion and do not message via secure chat for urgent patient care matters. Secure chat can be used for non urgent patient care matters.  How to contact the [facility] Attending or Consulting provider 7A - 7P or covering provider during after hours 7P -7A, for this patient?  Check the care team in [facility] and look for a) attending/consulting TRH provider listed and b) the [facility] team listed. Page or secure chat 7A-7P. Log into [email or web address] and use [place]'s universal password to access. If you do not have the password, please contact the hospital operator. Locate the [facility] provider you are looking for under [facility] and page to a number that you can be directly reached. If you still have difficulty reaching the provider, please page the [facility] (Director on Call) for the Hospitalists listed on amion for assistance.

## 2021-10-21 DIAGNOSIS — T5491XA Toxic effect of unspecified corrosive substance, accidental (unintentional), initial encounter: Secondary | ICD-10-CM | POA: Diagnosis not present

## 2021-10-21 LAB — COMPREHENSIVE METABOLIC PANEL
ALT: 2498 U/L — ABNORMAL HIGH (ref 0–44)
AST: 1857 U/L — ABNORMAL HIGH (ref 15–41)
Albumin: 3.1 g/dL — ABNORMAL LOW (ref 3.5–5.0)
Alkaline Phosphatase: 237 U/L — ABNORMAL HIGH (ref 38–126)
Anion gap: 9 (ref 5–15)
BUN: 15 mg/dL (ref 6–20)
CO2: 29 mmol/L (ref 22–32)
Calcium: 9.4 mg/dL (ref 8.9–10.3)
Chloride: 99 mmol/L (ref 98–111)
Creatinine, Ser: 0.88 mg/dL (ref 0.61–1.24)
GFR, Estimated: >60 mL/min (ref >60)
Glucose, Bld: 108 mg/dL — ABNORMAL HIGH (ref 70–99)
Potassium: 4.6 mmol/L (ref 3.5–5.1)
Sodium: 137 mmol/L (ref 135–145)
Total Bilirubin: 1.5 mg/dL — ABNORMAL HIGH (ref 0.3–1.2)
Total Protein: 6.3 g/dL — ABNORMAL LOW (ref 6.5–8.1)

## 2021-10-21 LAB — CMV IGM: CMV IgM: <30 AU/mL (ref 0.0–29.9)

## 2021-10-21 LAB — CERULOPLASMIN: Ceruloplasmin: 27.8 mg/dL (ref 16.0–31.0)

## 2021-10-21 LAB — ALPHA-1-ANTITRYPSIN: A-1 Antitrypsin, Ser: 172 mg/dL — ABNORMAL HIGH (ref 95–164)

## 2021-10-21 LAB — ANA: Anti Nuclear Antibody (ANA): NEGATIVE

## 2021-10-21 LAB — HCV RNA QUANT
HCV Quantitative Log: 5.73 log10 IU/mL (ref >1.70)
HCV Quantitative: 537000 IU/mL (ref >50)

## 2021-10-21 LAB — IGG: IgG (Immunoglobin G), Serum: 1321 mg/dL (ref 603–1613)

## 2021-10-21 LAB — MITOCHONDRIAL ANTIBODIES: Mitochondrial M2 Ab, IgG: <20 Units (ref 0.0–20.0)

## 2021-10-21 LAB — ANTI-SMOOTH MUSCLE ANTIBODY, IGG: F-Actin IgG: 6 Units (ref 0–19)

## 2021-10-21 NOTE — Progress Notes (Signed)
PROGRESS NOTE    Cody Poole  JKD:[id number] DOB: 11/04/1995 DOA: 10/14/2021 PCP: [facility]   Brief Narrative:  Cody Poole is a 26 y.o. male with medical history significant for paranoid schizophrenia, polysubstance abuse, GAD, who is admitted to [facility] on 10/14/2021 with acute bleach ingestion after presenting from home to [facility] ED complaining of abdominal pain.    The patient reports that he ingested a "swig" of bleach earlier on 10/14/2021 in order to "clear out his insides".  He conveys that this was done with therapeutic intent, denying any suicidal or homicidal ideations.  Denies ingestion of any additional household items over the last day, nor any recent recreational drug use.  Denies any concomitant alcohol consumption.   Following bleach ingestion, the patient reports development of mild nonradiating epigastric discomfort, which he describes as sharp in nature.  States that the pain has not been intensifying since onset.  Reports some associated nausea in the absence of any vomiting.    Per poison control recommendations, patient was started on N-acetylcysteine and monitor closely.  He was involuntarily committed in the emergency department.  Sitter was ordered at bedside due to his flight risk.  Assessment & Plan:   Principal Problem:   Ingestion of bleach Active Problems:   Paranoid schizophrenia (HCC)   Transaminitis   Wandering atrial pacemaker   Hypokalemia   Anxiety   Acute toxic hepatitis   Ingestion of substance   Elevated liver enzymes  Intentional bleach ingestion:  -At poison control's recommendation, patient was started on N-acetylcysteine at time of admission.  Has now been cleared by poison control and N-acetylcysteine was discontinued. -With his history of paranoid schizophrenia, patient was IVC'd in the emergency department due to trying to flee the hospital.  Ophelia Charter was ordered for bedside -Psychiatry consulted, cleared for  discharge -IVC and sitter discontinued per psychiatry recommendations. -Tolerating regular diet -Continue home olanzapine, gabapentin   Generalized anxiety disorder -Continue Zoloft and BuSpar   Acute toxic transaminitis/ DILI: -Hepatitis C antibody positive Finally it appears that his LFTs have started to taking the turn for the good, AST improved and ALT plateaued.  Bilirubin plateaued as well.  Likely due to bleach intake.  Ultrasound abdomen negative.  Seen by GI, multiple blood work-up ordered, alpha-1 antitrypsin slightly elevated but rest of the meds are pending or normal.  Appreciate GI help.    Wandering atrial pacemaker -EDP discussed with cardiology on-call, conveyed that no additional evaluation is necessary at this time   Polysubstance abuse -UDS positive for amphetamine, opiate, THC -Nicotine patch.  I have consulted TOC to help this patient provide resources and information about inpatient drug rehab as GI as recommended.   Chronic opioid dependence: Fentanyl discontinued, Suboxone resumed.  We will avoid opioids.  Chronic body aches/bilateral lower extremity pain: Patient has this chronic body aches.  This gets better with Toradol.  To avoid side effects of NSAIDs, we are ordering it one-time doses as needed.  Unable to give him Tylenol due to elevated LFTs/toxic hepatitis.  DVT prophylaxis: SCDs Start: 10/15/21 0998   Code Status: Full Code  Family Communication: Patient's significant other present at bedside.  Plan of care discussed with patient in length and he/she verbalized understanding and agreed with it.  Status is: Inpatient Remains inpatient appropriate because: Elevated and rising LFTs/toxic hepatitis   Estimated body mass index is 21.87 kg/m as calculated from the following:   Height as of this encounter: 6\' 1"  (1.854 m).  Weight as of this encounter: 75.2 kg.    Nutritional Assessment: Body mass index is 21.87 kg/m.Marland Kitchen Seen by dietician.  I agree  with the assessment and plan as outlined below: Nutrition Status: Nutrition Problem: Increased nutrient needs Etiology: acute illness Signs/Symptoms: estimated needs Interventions: MVI, Refer to RD note for recommendations  . Skin Assessment: I have examined the patient's skin and I agree with the wound assessment as performed by the wound care RN as outlined below:    Consultants:  Psychiatry  Procedures:  None  Antimicrobials:  Anti-infectives (From admission, onward)    None         Subjective: Seen and examined.  No new complaint.  Girlfriend at bedside.  Objective: Vitals:   10/20/21 1158 10/21/21 0017 10/21/21 0416 10/21/21 0900  BP: 137/78 123/76 127/69 (!) 131/96  Pulse: 64  (!) 59 90  Resp: 17 15  16   Temp: 97.8 F (36.6 C) 99 F (37.2 C) 98.9 F (37.2 C) 98.6 F (37 C)  TempSrc: Oral Oral Oral Oral  SpO2: 99% 96% 98% 98%  Weight:      Height:       No intake or output data in the 24 hours ending 10/21/21 1040 Filed Weights   10/14/21 2112 10/17/21 0213 10/20/21 0027  Weight: 81.6 kg 73.5 kg 75.2 kg    Examination:  General exam: Appears calm and comfortable  Respiratory system: Clear to auscultation. Respiratory effort normal. Cardiovascular system: S1 & S2 heard, RRR. No JVD, murmurs, rubs, gallops or clicks. No pedal edema. Gastrointestinal system: Abdomen is nondistended, soft and nontender. No organomegaly or masses felt. Normal bowel sounds heard. Central nervous system: Alert and oriented. No focal neurological deficits. Extremities: Symmetric 5 x 5 power. Skin: No rashes, lesions or ulcers.  Psychiatry: Judgement and insight appear poor  Data Reviewed: I have personally reviewed following labs and imaging studies  CBC: Recent Labs  Lab 10/14/21 2303 10/15/21 0642 10/20/21 1316  WBC 6.2 5.7 5.7  NEUTROABS  --  2.7 3.1  HGB 12.8* 14.4 12.8*  HCT 40.5 44.9 40.0  MCV 86.7 86.0 85.8  PLT 282 291 321    Basic Metabolic  Panel: Recent Labs  Lab 10/15/21 0142 10/15/21 0642 10/16/21 0353 10/17/21 0034 10/18/21 0105 10/19/21 0228 10/20/21 0057 10/21/21 0240  NA  --  140   < > 140 140 141 134* 137  K  --  3.6   < > 3.5 4.0 4.1 5.0 4.6  CL  --  101   < > 108 105 104 101 99  CO2  --  25   < > 27 28 29 27 29   GLUCOSE  --  140*   < > 117* 93 103* 91 108*  BUN  --  7   < > 8 9 14 16 15   CREATININE  --  0.78   < > 0.95 0.80 0.72 0.76 0.88  CALCIUM  --  8.9   < > 8.7* 9.1 9.2 9.3 9.4  MG 1.8 1.7  --  1.6*  --   --   --   --    < > = values in this interval not displayed.    GFR: Estimated Creatinine Clearance: 136.5 mL/min (by C-G formula based on SCr of 0.88 mg/dL). Liver Function Tests: Recent Labs  Lab 10/17/21 0034 10/18/21 0105 10/19/21 0228 10/20/21 0057 10/21/21 0240  AST 623* 1,166* 1,713* 2,188* 1,857*  ALT 788* 1,238* 1,887* 2,490* 2,498*  ALKPHOS 173* 202* 260*  246* 237*  BILITOT 0.6 0.8 0.8 1.6* 1.5*  PROT 5.7* 5.9* 5.6* 6.4* 6.3*  ALBUMIN 2.9* 3.0* 2.8* 3.2* 3.1*    Recent Labs  Lab 10/14/21 2303  LIPASE 29    No results for input(s): "AMMONIA" in the last 168 hours. Coagulation Profile: Recent Labs  Lab 10/15/21 0642 10/20/21 1316  INR 1.1 1.1    Cardiac Enzymes: Recent Labs  Lab 10/15/21 0142  CKTOTAL 232    BNP (last 3 results) No results for input(s): "PROBNP" in the last 8760 hours. HbA1C: No results for input(s): "HGBA1C" in the last 72 hours. CBG: Recent Labs  Lab 10/15/21 0000  GLUCAP 110*    Lipid Profile: No results for input(s): "CHOL", "HDL", "LDLCALC", "TRIG", "CHOLHDL", "LDLDIRECT" in the last 72 hours. Thyroid Function Tests: No results for input(s): "TSH", "T4TOTAL", "FREET4", "T3FREE", "THYROIDAB" in the last 72 hours. Anemia Panel: Recent Labs    10/20/21 1316  FERRITIN 1,217*  IRON 116   Sepsis Labs: Recent Labs  Lab 10/15/21 0142 10/15/21 0642  LATICACIDVEN 0.9 2.1*     No results found for this or any previous visit  (from the past 240 hour(s)).   Radiology Studies: No results found.  Scheduled Meds:  buprenorphine-naloxone  1 tablet Sublingual Daily   busPIRone  5 mg Oral Daily   gabapentin  300 mg Oral Daily   nicotine  14 mg Transdermal Daily   OLANZapine  10 mg Oral Daily   pantoprazole  40 mg Oral Daily   sertraline  50 mg Oral Daily   Continuous Infusions:   LOS: 6 days   Hughie Closs, MD [facility]  10/21/2021, 10:40 AM   *Please note that this is a verbal dictation therefore any spelling or grammatical errors are due to the "Dragon Medical One" system interpretation.  Please page via Amion and do not message via secure chat for urgent patient care matters. Secure chat can be used for non urgent patient care matters.  How to contact the [name] Attending or Consulting provider 7A - 7P or covering provider during after hours 7P -7A, for this patient?  Check the care team in [facility] and look for a) attending/consulting TRH provider listed and b) the [facility] team listed. Page or secure chat 7A-7P. Log into [email or web address] and use [place]'s universal password to access. If you do not have the password, please contact the hospital operator. Locate the [facility] provider you are looking for under [facility] and page to a number that you can be directly reached. If you still have difficulty reaching the provider, please page the [facility] (Director on Call) for the Hospitalists listed on amion for assistance.

## 2021-10-21 NOTE — Progress Notes (Signed)
CSW received SA consult. CSW spoke with patient at bedside with his male visitor who he provided verbal permission for CSW to speak in front of. Patient reported he lives with his mom and has a PCP and rides to appointments. He goes to [facility] for behavioral health services outpatient. He accepted community resources for substance use. No other needs identified at this time.   Joaquin Courts, MSW, [facility]

## 2021-10-21 NOTE — Progress Notes (Signed)
[facility] ROUNDING NOTE   Subjective: No acute events overnight.  Patient was ambulating in the hallways without issue.  Liver enzymes plateaued/downtrending as outlined below.   Objective: Vital signs in last 24 hours: Temp:  [97.8 F (36.6 C)-99 F (37.2 C)] 98.6 F (37 C) (07/11 0900) Pulse Rate:  [59-90] 90 (07/11 0900) Resp:  [15-17] 16 (07/11 0900) BP: (123-137)/(69-96) 131/96 (07/11 0900) SpO2:  [96 %-99 %] 98 % (07/11 0900) Last BM Date : 10/16/21 General: NAD, well conversive Abdomen:  Soft, NT, ND, +BS Ext:  No c/c/e    Intake/Output from previous day: No intake/output data recorded. Intake/Output this shift: No intake/output data recorded.   Lab Results: Recent Labs    10/20/21 1316  WBC 5.7  HGB 12.8*  PLT 321  MCV 85.8   BMET Recent Labs    10/19/21 0228 10/20/21 0057 10/21/21 0240  NA 141 134* 137  K 4.1 5.0 4.6  CL 104 101 99  CO2 29 27 29   GLUCOSE 103* 91 108*  BUN 14 16 15   CREATININE 0.72 0.76 0.88  CALCIUM 9.2 9.3 9.4   LFT Recent Labs    10/19/21 0228 10/20/21 0057 10/20/21 1316 10/21/21 0240  PROT 5.6* 6.4*  --  6.3*  ALBUMIN 2.8* 3.2*  --  3.1*  AST 1,713* 2,188*  --  1,857*  ALT 1,887* 2,490*  --  2,498*  ALKPHOS 260* 246*  --  237*  BILITOT 0.8 1.6*  --  1.5*  BILIDIR  --   --  1.0*  --    PT/INR Recent Labs    10/20/21 1316  INR 1.1      Imaging/Other results: No results found.    Assessment and Plan:  1) Elevated liver enzymes - Liver enzymes have now plateaued/downtrending - Multiple labs pending to evaluate etiology to include hepatitis C, HSV, EBV, CMV, ceruloplasmin, along with more chronic labs to include A1 AT, AMA, ANA, ASMA, IgG - Repeat liver enzymes tomorrow.  If reliably still downtrending, plan to repeat as outpatient in 4 weeks to ensure returning to baseline/normalization.  This can be coordinated through his PCM - Again strongly counseled patient on complete cessation of  all EtOH and drug use - No role for ERCP, EUS, etc.  2) Hepatitis C 3) Polysubstance abuse - HCV genotype and viral load pending - Can follow-up as an outpatient either in the ID clinic or referral to [facility] when he is ready to start treatment and can maintain compliance with therapy  Inpatient GI service will sign off at this time.  Please reconsult if liver enzymes uptrend or other acute issue   12/22/21, DO  10/21/2021, 11:16 AM [facility] Pager [phone]

## 2021-10-21 NOTE — Progress Notes (Signed)
Patient left AMA. Jacqulyn Bath, MD notified and [facility] [employer]/gallery curator) RN. Pt and nurse discussed risks of leaving and benefits of staying.

## 2021-10-22 ENCOUNTER — Emergency Department (HOSPITAL_COMMUNITY): Admission: EM | Admit: 2021-10-22 | Discharge: 2021-10-22 | Discharge status: Against Medical Advice | Payer: 59

## 2021-10-22 ENCOUNTER — Encounter (HOSPITAL_COMMUNITY): Payer: Self-pay

## 2021-10-22 ENCOUNTER — Other Ambulatory Visit: Payer: Self-pay

## 2021-10-22 ENCOUNTER — Emergency Department (HOSPITAL_COMMUNITY)
Admission: EM | Admit: 2021-10-22 | Discharge: 2021-10-22 | Discharge status: Against Medical Advice | Payer: 59 | Attending: Emergency Medicine | Admitting: Emergency Medicine

## 2021-10-22 DIAGNOSIS — R7401 Elevation of levels of liver transaminase levels: Secondary | ICD-10-CM | POA: Insufficient documentation

## 2021-10-22 DIAGNOSIS — Z5321 Procedure and treatment not carried out due to patient leaving prior to being seen by health care provider: Secondary | ICD-10-CM | POA: Insufficient documentation

## 2021-10-22 DIAGNOSIS — R799 Abnormal finding of blood chemistry, unspecified: Secondary | ICD-10-CM | POA: Insufficient documentation

## 2021-10-22 DIAGNOSIS — R61 Generalized hyperhidrosis: Secondary | ICD-10-CM | POA: Insufficient documentation

## 2021-10-22 LAB — COMPREHENSIVE METABOLIC PANEL
ALT: 2052 U/L — ABNORMAL HIGH (ref 0–44)
AST: 1005 U/L — ABNORMAL HIGH (ref 15–41)
Albumin: 4 g/dL (ref 3.5–5.0)
Alkaline Phosphatase: 228 U/L — ABNORMAL HIGH (ref 38–126)
Anion gap: 13 (ref 5–15)
BUN: 21 mg/dL — ABNORMAL HIGH (ref 6–20)
CO2: 26 mmol/L (ref 22–32)
Calcium: 9.7 mg/dL (ref 8.9–10.3)
Chloride: 98 mmol/L (ref 98–111)
Creatinine, Ser: 0.87 mg/dL (ref 0.61–1.24)
GFR, Estimated: >60 mL/min (ref >60)
Glucose, Bld: 107 mg/dL — ABNORMAL HIGH (ref 70–99)
Potassium: 4.4 mmol/L (ref 3.5–5.1)
Sodium: 137 mmol/L (ref 135–145)
Total Bilirubin: 1.3 mg/dL — ABNORMAL HIGH (ref 0.3–1.2)
Total Protein: 7.6 g/dL (ref 6.5–8.1)

## 2021-10-22 LAB — CBC WITH DIFFERENTIAL/PLATELET
Abs Immature Granulocytes: 0.03 10*3/uL (ref 0.00–0.07)
Basophils Absolute: 0.1 10*3/uL (ref 0.0–0.1)
Basophils Relative: 1 %
Eosinophils Absolute: 0 10*3/uL (ref 0.0–0.5)
Eosinophils Relative: 0 %
HCT: 40.1 % (ref 39.0–52.0)
Hemoglobin: 12.9 g/dL — ABNORMAL LOW (ref 13.0–17.0)
Immature Granulocytes: 0 %
Lymphocytes Relative: 31 %
Lymphs Abs: 3.1 10*3/uL (ref 0.7–4.0)
MCH: 27.7 pg (ref 26.0–34.0)
MCHC: 32.2 g/dL (ref 30.0–36.0)
MCV: 86.2 fL (ref 80.0–100.0)
Monocytes Absolute: 0.8 10*3/uL (ref 0.1–1.0)
Monocytes Relative: 8 %
Neutro Abs: 6 10*3/uL (ref 1.7–7.7)
Neutrophils Relative %: 60 %
Platelets: 340 10*3/uL (ref 150–400)
RBC: 4.65 MIL/uL (ref 4.22–5.81)
RDW: 14.9 % (ref 11.5–15.5)
WBC: 10.1 10*3/uL (ref 4.0–10.5)
nRBC: 0 % (ref 0.0–0.2)

## 2021-10-22 LAB — ETHANOL: Alcohol, Ethyl (B): <10 mg/dL (ref <10)

## 2021-10-22 LAB — HSV DNA BY PCR (REFERENCE LAB)
HSV 1 DNA: NEGATIVE
HSV 2 DNA: NEGATIVE

## 2021-10-22 LAB — SALICYLATE LEVEL: Salicylate Lvl: <7 mg/dL — ABNORMAL LOW (ref 7.0–30.0)

## 2021-10-22 LAB — EPSTEIN-BARR VIRUS VCA, IGM: EBV VCA IgM: <36 U/mL (ref 0.0–35.9)

## 2021-10-22 LAB — ACETAMINOPHEN LEVEL: Acetaminophen (Tylenol), Serum: <10 ug/mL — ABNORMAL LOW (ref 10–30)

## 2021-10-22 NOTE — ED Triage Notes (Addendum)
Pt reports he drank bleach on July 4th (to clear his stomach,thought he had worms) and was admitted to [facility] and told his liver enzymes were elevated but left AMA today. He states he changed his mind and would now like to be treated. Pt is diaphoretic in triage, states he doesn't feel good.

## 2021-10-22 NOTE — ED Provider Triage Note (Signed)
Emergency Medicine Provider Triage Evaluation Note  Cody Poole , a 26 y.o. male  was evaluated in triage.  Pt complains of abnormal labs.  Recent admitted with abnormal LFT's after drinking bleach (to clear his body of worms) and left AMA yesterday.  He was evaluated by GI and had further work-up started.  Now reports he wants to commit himself because he wants to kill himself.  Review of Systems  Positive: Abnormal labs, SI Negative: fever  Physical Exam  BP 113/85 (BP Location: Right Arm)   Pulse 63   Temp 99 F (37.2 C) (Oral)   Resp 16   SpO2 98%   Gen:   Awake, no distress   Resp:  Normal effort  MSK:   Moves extremities without difficulty  Other:    Medical Decision Making  Medically screening exam initiated at 2:55 AM.  Appropriate orders placed.  Cody Poole was informed that the remainder of the evaluation will be completed by another provider, this initial triage assessment does not replace that evaluation, and the importance of remaining in the ED until their evaluation is complete.  Abnormal labs.  Significant transaminitis, was seen by GI and had further work-up initiated, however left AMA yesterday.  Now also reports SI and wants to kill himself.  We will recheck labs.   Garlon Hatchet, PA-C 10/22/21 [phone]

## 2021-10-22 NOTE — ED Notes (Signed)
Pt just stepped out of triage room stating he is feeling suicidal and want to commit himself.

## 2021-10-22 NOTE — ED Notes (Signed)
Pt got up and stated he was leaving and changed his mind. PA aware.

## 2021-10-22 NOTE — ED Notes (Signed)
This RN attempted to call patient twice. Per security pt got up threw his stickers in the trash and walked out the front door. Charge RN aware and PA aware.

## 2021-10-23 LAB — HEPATITIS C GENOTYPE

## 2021-10-23 NOTE — Discharge Summary (Signed)
PatientPhysician Discharge Summary  Cody Poole TKP:[id number] DOB: 16-Jul-1995 DOA: 10/14/2021  PCP: [facility]  Admit date: 10/14/2021 Discharge date: 10/23/2021 30 Day Unplanned Readmission Risk Score    Flowsheet Row ED to Hosp-Admission (Discharged) from 10/14/2021 in [facility] [facility]  30 Day Unplanned Readmission Risk Score (%) 49.09 Filed at 10/21/2021 1600       This score is the patient's risk of an unplanned readmission within 30 days of being discharged (0 -100%). The score is based on dignosis, age, lab data, medications, orders, and past utilization.   Low:  0-14.9   Medium: 15-21.9   High: 22-29.9   Extreme: 30 and above          Admitted From: Home Disposition: Left AGAINST MEDICAL ADVICE  Recommendations for Outpatient Follow-up:  Follow up with PCP in 1-2 weeks Please obtain BMP/CBC in one week Please follow up with your PCP on the following pending results: Unresulted Labs (From admission, onward)     Start     Ordered   10/20/21 1326  Miscellaneous [facility] test (send-out)  Once,   R       Comments: Hep E IgMTest code: [id number]. Gold tube. Send out lab [facility]   Question:  Test name / description:  Answer:  Hepatitis E IgM   10/20/21 1325   10/20/21 1219  Hepatitis C genotype  Once,   R       Question:  Specimen collection method  Answer:  Lab=Lab collect   10/20/21 1220             Discharge Condition: Fair  Brief/Interim Summary: Cody Poole is a 26 y.o. male with medical history significant for paranoid schizophrenia, polysubstance abuse, GAD, who was admitted to [facility] on 10/14/2021 with acute bleach ingestion after presenting from home to [facility] ED complaining of abdominal pain.    The patient reports that he ingested a "swig" of bleach earlier on 10/14/2021 in order to "clear out his insides".  He conveys that this was done with therapeutic intent, denying any suicidal or homicidal ideations.  Denies ingestion  of any additional household items over the last day, nor any recent recreational drug use.  Denies any concomitant alcohol consumption.   Following bleach ingestion, the patient reports development of mild nonradiating epigastric discomfort, which he describes as sharp in nature.  States that the pain has not been intensifying since onset.  Reports some associated nausea in the absence of any vomiting.  -At poison control's recommendation, patient was started on N-acetylcysteine at time of admission.  Was eventually cleared by poison control and N-acetylcysteine was discontinued. -With his history of paranoid schizophrenia, patient was IVC'd in the emergency department due to trying to flee the hospital.  Ophelia Charter was ordered for bedside -Psychiatry consulted, who then cleared for discharge -IVC and sitter discontinued per psychiatry recommendations. -Continue home olanzapine, gabapentin   Generalized anxiety disorder -Continue Zoloft and BuSpar   Acute toxic transaminitis/ DILI: -Hepatitis C antibody positive Finally it appears that his LFTs have started to taking the turn for the good, AST improved and ALT plateaued.  Bilirubin plateaued as well.  Likely due to bleach intake.  Ultrasound abdomen negative.  Seen by GI, multiple blood work-up ordered, alpha-1 antitrypsin slightly elevated but rest of the meds are pending or normal.  GI and I had recommended this patient to stay overnight so we can monitor him overnight and repeat his labs tomorrow and plan was to discharge  him once we see a trend downward however later on,  patient got upset for not giving him opioids and left AGAINST MEDICAL ADVICE.  He was clearly informed that that was not a good idea and got for bed if his liver enzymes start to get worse again, he will be at high risk of liver failure which may cause death.  He verbalized understanding.   Wandering atrial pacemaker -EDP discussed with cardiology on-call, conveyed that no  additional evaluation is necessary at this time   Polysubstance abuse -UDS positive for amphetamine, opiate, THC -Nicotine patch.  I have consulted TOC to help this patient provide resources and information about inpatient drug rehab as GI as recommended.   Chronic opioid dependence: Fentanyl discontinued, Suboxone resumed.    Chronic body aches/bilateral lower extremity pain: Patient has this chronic body aches.  This gets better with Toradol.  To avoid side effects of NSAIDs, we are ordering it one-time doses as needed.  Unable to give him Tylenol due to elevated LFTs/toxic hepatitis.  Discharge Diagnoses:  Principal Problem:   Ingestion of bleach Active Problems:   Paranoid schizophrenia (HCC)   Transaminitis   Wandering atrial pacemaker   Hypokalemia   Anxiety   Acute toxic hepatitis   Ingestion of substance   Elevated liver enzymes    Discharge Instructions   Allergies as of 10/21/2021       Reactions   Haldol [haloperidol] Swelling, Other (See Comments)   Tongue swelling  Dysarthria  Relieved with benadryl         Medication List     ASK your doctor about these medications    buprenorphine-naloxone 8-2 mg Subl SL tablet Commonly known as: SUBOXONE Place 1 tablet under the tongue in the morning and at bedtime.   BUSPIRONE HCL PO Take 1 tablet by mouth daily.   clonazePAM 1 MG tablet Commonly known as: KLONOPIN Take 1 mg by mouth daily.   gabapentin 300 MG capsule Commonly known as: NEURONTIN Take 300 mg by mouth daily. Ask about: Which instructions should I use?   OLANZapine 10 MG tablet Commonly known as: ZYPREXA Take 10 mg by mouth daily. Ask about: Which instructions should I use?   sertraline 50 MG tablet Commonly known as: ZOLOFT Take 50 mg by mouth daily.        Allergies  Allergen Reactions   Haldol [Haloperidol] Swelling and Other (See Comments)    Tongue swelling  Dysarthria  Relieved with benadryl     Consultations: GI and  psychiatry   Procedures/Studies: US Abdomen Limited RUQ (LIVER/GB)  Result Date: 10/17/2021 CLINICAL DATA:  Transaminitis. EXAM: ULTRASOUND ABDOMEN LIMITED RIGHT UPPER QUADRANT COMPARISON:  CT abdomen pelvis dated October 13, 2020. FINDINGS: Gallbladder: Contracted. No gallstones or wall thickening visualized. No sonographic Murphy sign noted by sonographer. Common bile duct: Diameter: 3 mm, normal. Liver: No focal lesion identified. Within normal limits in parenchymal echogenicity. Portal vein is patent on color Doppler imaging with normal direction of blood flow towards the liver. Other: None. IMPRESSION: 1. Normal right upper quadrant ultrasound. Electronically Signed   By: Obie Dredge M.D.   On: 10/17/2021 16:30   DG Chest Port 1 View  Result Date: 10/15/2021 CLINICAL DATA:  Pain. EXAM: PORTABLE CHEST 1 VIEW COMPARISON:  Chest x-ray 10/15/2021. FINDINGS: The heart size and mediastinal contours are within normal limits. Both lungs are clear. No visible pleural effusions or pneumothorax. No acute osseous abnormality. IMPRESSION: No active disease. Electronically Signed   By: Thornton Dales  Yetta Barre M.D.   On: 10/15/2021 07:55   DG Chest 2 View  Result Date: 10/15/2021 CLINICAL DATA:  Caustic ingestion. EXAM: CHEST - 2 VIEW COMPARISON:  May 15, 2021 FINDINGS: The heart size and mediastinal contours are within normal limits. Both lungs are clear. The visualized skeletal structures are unremarkable. IMPRESSION: No active cardiopulmonary disease. Electronically Signed   By: Aram Candela M.D.   On: 10/15/2021 02:06     Discharge Exam: Vitals:   10/21/21 0900 10/21/21 1244  BP: (!) 131/96 139/86  Pulse: 90 86  Resp: 16 16  Temp: 98.6 F (37 C) 97.8 F (36.6 C)  SpO2: 98%    Vitals:   10/21/21 0017 10/21/21 0416 10/21/21 0900 10/21/21 1244  BP: 123/76 127/69 (!) 131/96 139/86  Pulse:  (!) 59 90 86  Resp: 15  16 16   Temp: 99 F (37.2 C) 98.9 F (37.2 C) 98.6 F (37 C) 97.8 F (36.6 C)   TempSrc: Oral Oral Oral Oral  SpO2: 96% 98% 98%   Weight:      Height:        General: Pt is alert, awake, not in acute distress Cardiovascular: RRR, S1/S2 +, no rubs, no gallops Respiratory: CTA bilaterally, no wheezing, no rhonchi Abdominal: Soft, NT, ND, bowel sounds + Extremities: no edema, no cyanosis    The results of significant diagnostics from this hospitalization (including imaging, microbiology, ancillary and laboratory) are listed below for reference.     Microbiology: No results found for this or any previous visit (from the past 240 hour(s)).   Labs: BNP (last 3 results) No results for input(s): "BNP" in the last 8760 hours. Basic Metabolic Panel: Recent Labs  Lab 10/17/21 0034 10/18/21 0105 10/19/21 0228 10/20/21 0057 10/21/21 0240 10/22/21 0306  NA 140 140 141 134* 137 137  K 3.5 4.0 4.1 5.0 4.6 4.4  CL 108 105 104 101 99 98  CO2 27 28 29 27 29 26   GLUCOSE 117* 93 103* 91 108* 107*  BUN 8 9 14 16 15  21*  CREATININE 0.95 0.80 0.72 0.76 0.88 0.87  CALCIUM 8.7* 9.1 9.2 9.3 9.4 9.7  MG 1.6*  --   --   --   --   --    Liver Function Tests: Recent Labs  Lab 10/18/21 0105 10/19/21 0228 10/20/21 0057 10/21/21 0240 10/22/21 0306  AST 1,166* 1,713* 2,188* 1,857* 1,005*  ALT 1,238* 1,887* 2,490* 2,498* 2,052*  ALKPHOS 202* 260* 246* 237* 228*  BILITOT 0.8 0.8 1.6* 1.5* 1.3*  PROT 5.9* 5.6* 6.4* 6.3* 7.6  ALBUMIN 3.0* 2.8* 3.2* 3.1* 4.0   No results for input(s): "LIPASE", "AMYLASE" in the last 168 hours. No results for input(s): "AMMONIA" in the last 168 hours. CBC: Recent Labs  Lab 10/20/21 1316 10/22/21 0306  WBC 5.7 10.1  NEUTROABS 3.1 6.0  HGB 12.8* 12.9*  HCT 40.0 40.1  MCV 85.8 86.2  PLT 321 340   Cardiac Enzymes: No results for input(s): "CKTOTAL", "CKMB", "CKMBINDEX", "TROPONINI" in the last 168 hours. BNP: Invalid input(s): "POCBNP" CBG: No results for input(s): "GLUCAP" in the last 168 hours. D-Dimer No results for  input(s): "DDIMER" in the last 72 hours. Hgb A1c No results for input(s): "HGBA1C" in the last 72 hours. Lipid Profile No results for input(s): "CHOL", "HDL", "LDLCALC", "TRIG", "CHOLHDL", "LDLDIRECT" in the last 72 hours. Thyroid function studies No results for input(s): "TSH", "T4TOTAL", "T3FREE", "THYROIDAB" in the last 72 hours.  Invalid input(s): "FREET3" Anemia work up No  results for input(s): "VITAMINB12", "FOLATE", "FERRITIN", "TIBC", "IRON", "RETICCTPCT" in the last 72 hours. Urinalysis    Component Value Date/Time   COLORURINE YELLOW 05/10/2021 2318   APPEARANCEUR CLEAR 05/10/2021 2318   LABSPEC 1.020 05/10/2021 2318   PHURINE 7.0 05/10/2021 2318   GLUCOSEU NEGATIVE 05/10/2021 2318   HGBUR NEGATIVE 05/10/2021 2318   BILIRUBINUR NEGATIVE 05/10/2021 2318   KETONESUR NEGATIVE 05/10/2021 2318   PROTEINUR 100 (A) 05/10/2021 2318   NITRITE NEGATIVE 05/10/2021 2318   LEUKOCYTESUR NEGATIVE 05/10/2021 2318   Sepsis Labs Recent Labs  Lab 10/20/21 1316 10/22/21 0306  WBC 5.7 10.1   Microbiology No results found for this or any previous visit (from the past 240 hour(s)).   Time coordinating discharge: Over 30 minutes  SIGNED:   Hughie Closs, MD  [facility] 10/23/2021, 4:25 PM *Please note that this is a verbal dictation therefore any spelling or grammatical errors are due to the "[employer] One" system interpretation. If 7PM-7AM, please contact night-coverage [email or web address]

## 2021-10-24 LAB — MISC LABCORP TEST (SEND OUT): Labcorp test code: 9985

## 2021-10-30 ENCOUNTER — Emergency Department (HOSPITAL_COMMUNITY)
Admission: EM | Admit: 2021-10-30 | Discharge: 2021-10-30 | Disposition: A | Discharge status: Home / Self Care | Payer: No Typology Code available for payment source | Attending: Emergency Medicine | Admitting: Emergency Medicine

## 2021-10-30 ENCOUNTER — Other Ambulatory Visit: Payer: Self-pay

## 2021-10-30 ENCOUNTER — Emergency Department (HOSPITAL_COMMUNITY)
Admission: EM | Admit: 2021-10-30 | Discharge: 2021-10-31 | Disposition: A | Discharge status: Home / Self Care | Payer: No Typology Code available for payment source | Source: Home / Self Care | Attending: Emergency Medicine | Admitting: Emergency Medicine

## 2021-10-30 ENCOUNTER — Encounter (HOSPITAL_COMMUNITY): Payer: Self-pay | Admitting: Emergency Medicine

## 2021-10-30 DIAGNOSIS — R197 Diarrhea, unspecified: Secondary | ICD-10-CM | POA: Insufficient documentation

## 2021-10-30 DIAGNOSIS — T43621A Poisoning by amphetamines, accidental (unintentional), initial encounter: Secondary | ICD-10-CM | POA: Diagnosis not present

## 2021-10-30 DIAGNOSIS — R7401 Elevation of levels of liver transaminase levels: Secondary | ICD-10-CM | POA: Insufficient documentation

## 2021-10-30 DIAGNOSIS — R112 Nausea with vomiting, unspecified: Secondary | ICD-10-CM | POA: Insufficient documentation

## 2021-10-30 DIAGNOSIS — D72829 Elevated white blood cell count, unspecified: Secondary | ICD-10-CM | POA: Diagnosis not present

## 2021-10-30 DIAGNOSIS — R1032 Left lower quadrant pain: Secondary | ICD-10-CM | POA: Insufficient documentation

## 2021-10-30 DIAGNOSIS — T50901A Poisoning by unspecified drugs, medicaments and biological substances, accidental (unintentional), initial encounter: Secondary | ICD-10-CM

## 2021-10-30 DIAGNOSIS — T50991A Poisoning by other drugs, medicaments and biological substances, accidental (unintentional), initial encounter: Secondary | ICD-10-CM | POA: Diagnosis present

## 2021-10-30 DIAGNOSIS — I10 Essential (primary) hypertension: Secondary | ICD-10-CM | POA: Insufficient documentation

## 2021-10-30 DIAGNOSIS — E876 Hypokalemia: Secondary | ICD-10-CM | POA: Insufficient documentation

## 2021-10-30 DIAGNOSIS — D649 Anemia, unspecified: Secondary | ICD-10-CM | POA: Insufficient documentation

## 2021-10-30 LAB — RAPID URINE DRUG SCREEN, HOSP PERFORMED
Amphetamines: POSITIVE — AB
Amphetamines: POSITIVE — AB
Barbiturates: NOT DETECTED
Barbiturates: NOT DETECTED
Benzodiazepines: NOT DETECTED
Benzodiazepines: NOT DETECTED
Cocaine: NOT DETECTED
Cocaine: POSITIVE — AB
Opiates: NOT DETECTED
Opiates: POSITIVE — AB
Tetrahydrocannabinol: POSITIVE — AB
Tetrahydrocannabinol: POSITIVE — AB

## 2021-10-30 LAB — COMPREHENSIVE METABOLIC PANEL
ALT: 160 U/L — ABNORMAL HIGH (ref 0–44)
ALT: 126 U/L — ABNORMAL HIGH (ref 0–44)
AST: 43 U/L — ABNORMAL HIGH (ref 15–41)
AST: 34 U/L (ref 15–41)
Albumin: 3.6 g/dL (ref 3.5–5.0)
Albumin: 3.4 g/dL — ABNORMAL LOW (ref 3.5–5.0)
Alkaline Phosphatase: 155 U/L — ABNORMAL HIGH (ref 38–126)
Alkaline Phosphatase: 122 U/L (ref 38–126)
Anion gap: 6 (ref 5–15)
Anion gap: 7 (ref 5–15)
BUN: 11 mg/dL (ref 6–20)
BUN: 8 mg/dL (ref 6–20)
CO2: 29 mmol/L (ref 22–32)
CO2: 27 mmol/L (ref 22–32)
Calcium: 9.3 mg/dL (ref 8.9–10.3)
Calcium: 9.1 mg/dL (ref 8.9–10.3)
Chloride: 108 mmol/L (ref 98–111)
Chloride: 105 mmol/L (ref 98–111)
Creatinine, Ser: 0.95 mg/dL (ref 0.61–1.24)
Creatinine, Ser: 0.74 mg/dL (ref 0.61–1.24)
GFR, Estimated: >60 mL/min (ref >60)
GFR, Estimated: >60 mL/min (ref >60)
Glucose, Bld: 104 mg/dL — ABNORMAL HIGH (ref 70–99)
Glucose, Bld: 97 mg/dL (ref 70–99)
Potassium: 3.4 mmol/L — ABNORMAL LOW (ref 3.5–5.1)
Potassium: 3.3 mmol/L — ABNORMAL LOW (ref 3.5–5.1)
Sodium: 143 mmol/L (ref 135–145)
Sodium: 139 mmol/L (ref 135–145)
Total Bilirubin: 0.6 mg/dL (ref 0.3–1.2)
Total Bilirubin: 0.8 mg/dL (ref 0.3–1.2)
Total Protein: 7.3 g/dL (ref 6.5–8.1)
Total Protein: 6.8 g/dL (ref 6.5–8.1)

## 2021-10-30 LAB — URINALYSIS, ROUTINE W REFLEX MICROSCOPIC
Bacteria, UA: NONE SEEN
Bilirubin Urine: NEGATIVE
Glucose, UA: NEGATIVE mg/dL
Hgb urine dipstick: NEGATIVE
Ketones, ur: NEGATIVE mg/dL
Leukocytes,Ua: NEGATIVE
Nitrite: NEGATIVE
Protein, ur: 30 mg/dL — AB
Specific Gravity, Urine: 1.032 — ABNORMAL HIGH (ref 1.005–1.030)
pH: 5 (ref 5.0–8.0)

## 2021-10-30 LAB — CBC WITH DIFFERENTIAL/PLATELET
Abs Immature Granulocytes: 0.02 10*3/uL (ref 0.00–0.07)
Abs Immature Granulocytes: 0.01 10*3/uL (ref 0.00–0.07)
Basophils Absolute: 0.1 10*3/uL (ref 0.0–0.1)
Basophils Absolute: 0 10*3/uL (ref 0.0–0.1)
Basophils Relative: 1 %
Basophils Relative: 1 %
Eosinophils Absolute: 0.1 10*3/uL (ref 0.0–0.5)
Eosinophils Absolute: 0.1 10*3/uL (ref 0.0–0.5)
Eosinophils Relative: 1 %
Eosinophils Relative: 1 %
HCT: 37.8 % — ABNORMAL LOW (ref 39.0–52.0)
HCT: 35.1 % — ABNORMAL LOW (ref 39.0–52.0)
Hemoglobin: 12.1 g/dL — ABNORMAL LOW (ref 13.0–17.0)
Hemoglobin: 11.3 g/dL — ABNORMAL LOW (ref 13.0–17.0)
Immature Granulocytes: 0 %
Immature Granulocytes: 0 %
Lymphocytes Relative: 14 %
Lymphocytes Relative: 25 %
Lymphs Abs: 1.5 10*3/uL (ref 0.7–4.0)
Lymphs Abs: 1.6 10*3/uL (ref 0.7–4.0)
MCH: 28.1 pg (ref 26.0–34.0)
MCH: 28 pg (ref 26.0–34.0)
MCHC: 32 g/dL (ref 30.0–36.0)
MCHC: 32.2 g/dL (ref 30.0–36.0)
MCV: 87.7 fL (ref 80.0–100.0)
MCV: 86.9 fL (ref 80.0–100.0)
Monocytes Absolute: 0.7 10*3/uL (ref 0.1–1.0)
Monocytes Absolute: 0.5 10*3/uL (ref 0.1–1.0)
Monocytes Relative: 7 %
Monocytes Relative: 7 %
Neutro Abs: 8.4 10*3/uL — ABNORMAL HIGH (ref 1.7–7.7)
Neutro Abs: 4.3 10*3/uL (ref 1.7–7.7)
Neutrophils Relative %: 77 %
Neutrophils Relative %: 66 %
Platelets: 449 10*3/uL — ABNORMAL HIGH (ref 150–400)
Platelets: 405 10*3/uL — ABNORMAL HIGH (ref 150–400)
RBC: 4.31 MIL/uL (ref 4.22–5.81)
RBC: 4.04 MIL/uL — ABNORMAL LOW (ref 4.22–5.81)
RDW: 14.6 % (ref 11.5–15.5)
RDW: 14.5 % (ref 11.5–15.5)
WBC: 10.7 10*3/uL — ABNORMAL HIGH (ref 4.0–10.5)
WBC: 6.4 10*3/uL (ref 4.0–10.5)
nRBC: 0 % (ref 0.0–0.2)
nRBC: 0 % (ref 0.0–0.2)

## 2021-10-30 LAB — ACETAMINOPHEN LEVEL
Acetaminophen (Tylenol), Serum: <10 ug/mL — ABNORMAL LOW (ref 10–30)
Acetaminophen (Tylenol), Serum: <10 ug/mL — ABNORMAL LOW (ref 10–30)

## 2021-10-30 LAB — ETHANOL
Alcohol, Ethyl (B): <10 mg/dL (ref <10)
Alcohol, Ethyl (B): <10 mg/dL (ref <10)

## 2021-10-30 LAB — CBG MONITORING, ED: Glucose-Capillary: 80 mg/dL (ref 70–99)

## 2021-10-30 LAB — SALICYLATE LEVEL
Salicylate Lvl: <7 mg/dL — ABNORMAL LOW (ref 7.0–30.0)
Salicylate Lvl: <7 mg/dL — ABNORMAL LOW (ref 7.0–30.0)

## 2021-10-30 LAB — LIPASE, BLOOD: Lipase: 22 U/L (ref 11–51)

## 2021-10-30 NOTE — ED Triage Notes (Signed)
Pt reported to ED with multiple complaints including headache and abdominal pain. Pt states that he has had a headache for several days and abdominal pain for three days. w

## 2021-10-30 NOTE — ED Provider Notes (Signed)
[facility]-EMERGENCY DEPT Provider Note   CSN: [id number] Arrival date & time: 10/30/21  0154     History  Chief Complaint  Patient presents with   Drug Overdose    Cody Poole is a 26 y.o. male.  The history is provided by the patient and medical records.   26 y.o. M presenting to ED from street due to drug overdose.  He was found down, unresponsive but breathing on his own.  EMS was called and he was initially given 2 mg Narcan IM without response, IV was placed and given additional 2 mg IV Narcan and patient came to.  He is continued breathing on his own in route.  No vomiting.  Patient arrives awake and alert.  He has no complaints.  He denies any drug use tonight.  Home Medications Prior to Admission medications   Medication Sig Start Date End Date Taking? Authorizing Provider  buprenorphine-naloxone (SUBOXONE) 8-2 mg SUBL SL tablet Place 1 tablet under the tongue in the morning and at bedtime.    [provider]  BUSPIRONE HCL PO Take 1 tablet by mouth daily.    [provider]  clonazePAM (KLONOPIN) 1 MG tablet Take 1 mg by mouth daily.    [provider]  gabapentin (NEURONTIN) 300 MG capsule Take 300 mg by mouth daily.    [provider]  OLANZapine (ZYPREXA) 10 MG tablet Take 10 mg by mouth daily.    [provider]  sertraline (ZOLOFT) 50 MG tablet Take 50 mg by mouth daily.    [provider]      Allergies    Haldol [haloperidol]    Review of Systems   Review of Systems  Psychiatric/Behavioral:         OD  All other systems reviewed and are negative.   Physical Exam Updated Vital Signs BP 137/75   Pulse 83   Temp 97.7 F (36.5 C) (Oral)   Resp 20   Ht 6' (1.829 m)   Wt 68 kg   SpO2 98%   BMI 20.34 kg/m   Physical Exam Vitals and nursing note reviewed.  Constitutional:      Appearance: He is well-developed.     Comments: Disheveled, dirty, ants crawling on patient  HENT:      Head: Normocephalic and atraumatic.  Eyes:     Conjunctiva/sclera: Conjunctivae normal.     Pupils: Pupils are equal, round, and reactive to light.  Cardiovascular:     Rate and Rhythm: Normal rate and regular rhythm.     Heart sounds: Normal heart sounds.  Pulmonary:     Effort: Pulmonary effort is normal.     Breath sounds: Normal breath sounds.  Abdominal:     General: Bowel sounds are normal.     Palpations: Abdomen is soft.  Musculoskeletal:        General: Normal range of motion.     Cervical back: Normal range of motion.  Skin:    General: Skin is warm and dry.  Neurological:     Mental Status: He is alert and oriented to person, place, and time.     Comments: Awake, alert, oriented, moving extremities x4, following commands     ED Results / Procedures / Treatments   Labs (all labs ordered are listed, but only abnormal results are displayed) Labs Reviewed  CBC WITH DIFFERENTIAL/PLATELET - Abnormal; Notable for the following components:      Result Value   WBC 10.7 (*)  Hemoglobin 12.1 (*)    HCT 37.8 (*)    Platelets 449 (*)    Neutro Abs 8.4 (*)    All other components within normal limits  COMPREHENSIVE METABOLIC PANEL - Abnormal; Notable for the following components:   Potassium 3.4 (*)    Glucose, Bld 104 (*)    AST 43 (*)    ALT 160 (*)    Alkaline Phosphatase 155 (*)    All other components within normal limits  SALICYLATE LEVEL - Abnormal; Notable for the following components:   Salicylate Lvl <7.0 (*)    All other components within normal limits  ACETAMINOPHEN LEVEL - Abnormal; Notable for the following components:   Acetaminophen (Tylenol), Serum <10 (*)    All other components within normal limits  RAPID URINE DRUG SCREEN, HOSP PERFORMED - Abnormal; Notable for the following components:   Amphetamines POSITIVE (*)    Tetrahydrocannabinol POSITIVE (*)    All other components within normal limits  ETHANOL    EKG EKG  Interpretation  Date/Time:  Thursday October 30 2021 01:35:31 EDT Ventricular Rate:  99 PR Interval:  145 QRS Duration: 102 QT Interval:  332 QTC Calculation: 426 R Axis:   80 Text Interpretation: Sinus rhythm Premature atrial complexes Right atrial enlargement Probable left ventricular hypertrophy Confirmed by Geoffery Lyons ([id number]) on 10/30/2021 2:56:53 AM  Radiology No results found.  Procedures Procedures    Medications Ordered in ED Medications - No data to display  ED Course/ Medical Decision Making/ A&P                           Medical Decision Making Amount and/or Complexity of Data Reviewed Labs: ordered. ECG/medicine tests: ordered and independent interpretation performed.   26 y.o. M presenting to the ED after drug OD.  Found unresponsive on the street but breathing on his own, did respond to IV narcan.  On arrival, he is AAOx3 but dirty and disheveled.  He has ants crawling on him.  He denies drug use tonight.  Will check screening labs, EKG.    Labs with mild leukocytosis, stable anemia.  No electrolyte derangement.  Does have transaminitis, however this is significantly improved from earlier this month when he was admitted for the same.  Denies current abdominal pain.  No vomiting.  UDS is positive for amphetamines and THC.  Negative Tylenol, salicylate, and ethanol levels.  Will monitor.  4:53 AM Patient asking to leave.  He remains awake/alert and has not required further Narcan.  His vitals are stable on room air.  Denies intentional self harm.  Advised to refrain from using illicit substances.  Can return here for new concerns.  Final Clinical Impression(s) / ED Diagnoses Final diagnoses:  Accidental drug overdose, initial encounter    Rx / DC Orders ED Discharge Orders     None         Garlon Hatchet, PA-C 10/30/21 0501    Geoffery Lyons, MD 10/30/21 [phone]

## 2021-10-30 NOTE — ED Provider Triage Note (Addendum)
  Emergency Medicine Provider Triage Evaluation Note  MRN:  [id number]  Arrival date & time: 10/30/21    Medically screening exam initiated at 10:07 PM.   CC:   Abdominal Pain   HPI:  Cody Poole is a 26 y.o. year-old male presents to the ED with chief complaint of abdominal pain and swelling for the past week.  He reports associated headache.  Denies vomiting, diarrhea, or dysuria.  Denies fevers or chills.  Seen last night at [facility] for overdose.  Also says that he drank bleach sometime, he's not sure when, and says he's having thoughts of self harm.  History provided by patient. ROS:  -As included in HPI PE:   Vitals:   10/30/21 2131  BP: (!) 153/96  Pulse: 83  Resp: 16  Temp: 97.9 F (36.6 C)  SpO2: 100%    Non-toxic appearing No respiratory distress Unkempt Sores on legs Abdomen slightly swollen with some tenderness MDM:   I've ordered labs and imaging in triage to expedite lab/diagnostic workup.  Patient was informed that the remainder of the evaluation will be completed by another provider, this initial triage assessment does not replace that evaluation, and the importance of remaining in the ED until their evaluation is complete.    Roxy Horseman, PA-C 10/30/21 2209    Roxy Horseman, PA-C 10/30/21 2211

## 2021-10-30 NOTE — ED Triage Notes (Signed)
BIBA from the streets for drug overdose was found unresponsive but breathing on own, given 2mg  Narcan IM no response IV placed given 2mg  IV came around. Ants crawling all over patient   20 LAC

## 2021-10-31 ENCOUNTER — Emergency Department (HOSPITAL_COMMUNITY): Payer: No Typology Code available for payment source

## 2021-10-31 MED ORDER — IOHEXOL 300 MG/ML  SOLN
100.0000 mL | Freq: Once | INTRAMUSCULAR | Status: AC | PRN
  Administered 2021-10-31: 100 mL via INTRAVENOUS

## 2021-10-31 NOTE — Discharge Instructions (Addendum)
Seen in the emergency department today for abdominal pain.  Your labs and CT were normal.  Please follow-up with your providers for prescribe your medications in order to obtain your refills.  We offered you doses of your Zoloft, Zyprexa and BuSpar today which you declined.

## 2021-10-31 NOTE — ED Notes (Signed)
Pt did not response to check in  

## 2021-10-31 NOTE — ED Notes (Signed)
Called pt x3 for vitals, no response. 
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
## 2021-11-05 ENCOUNTER — Emergency Department (HOSPITAL_COMMUNITY): Payer: No Typology Code available for payment source

## 2021-11-05 ENCOUNTER — Emergency Department (HOSPITAL_COMMUNITY)
Admission: EM | Admit: 2021-11-05 | Discharge: 2021-11-05 | Disposition: A | Discharge status: Home / Self Care | Payer: No Typology Code available for payment source | Attending: Emergency Medicine | Admitting: Emergency Medicine

## 2021-11-05 DIAGNOSIS — I10 Essential (primary) hypertension: Secondary | ICD-10-CM | POA: Insufficient documentation

## 2021-11-05 DIAGNOSIS — F1210 Cannabis abuse, uncomplicated: Secondary | ICD-10-CM | POA: Diagnosis not present

## 2021-11-05 DIAGNOSIS — R000 Tachycardia, unspecified: Secondary | ICD-10-CM | POA: Diagnosis not present

## 2021-11-05 DIAGNOSIS — F1510 Other stimulant abuse, uncomplicated: Secondary | ICD-10-CM | POA: Diagnosis not present

## 2021-11-05 DIAGNOSIS — Y900 Blood alcohol level of less than 20 mg/100 ml: Secondary | ICD-10-CM | POA: Diagnosis not present

## 2021-11-05 DIAGNOSIS — T401X1A Poisoning by heroin, accidental (unintentional), initial encounter: Secondary | ICD-10-CM | POA: Insufficient documentation

## 2021-11-05 DIAGNOSIS — F1410 Cocaine abuse, uncomplicated: Secondary | ICD-10-CM | POA: Insufficient documentation

## 2021-11-05 LAB — URINALYSIS, ROUTINE W REFLEX MICROSCOPIC
Bacteria, UA: NONE SEEN
Bilirubin Urine: NEGATIVE
Glucose, UA: NEGATIVE mg/dL
Hgb urine dipstick: NEGATIVE
Ketones, ur: NEGATIVE mg/dL
Leukocytes,Ua: NEGATIVE
Nitrite: NEGATIVE
Protein, ur: 100 mg/dL — AB
Specific Gravity, Urine: 1.02 (ref 1.005–1.030)
pH: 6 (ref 5.0–8.0)

## 2021-11-05 LAB — RAPID URINE DRUG SCREEN, HOSP PERFORMED
Amphetamines: POSITIVE — AB
Barbiturates: NOT DETECTED
Benzodiazepines: NOT DETECTED
Cocaine: POSITIVE — AB
Opiates: NOT DETECTED
Tetrahydrocannabinol: POSITIVE — AB

## 2021-11-05 LAB — SALICYLATE LEVEL: Salicylate Lvl: <7 mg/dL — ABNORMAL LOW (ref 7.0–30.0)

## 2021-11-05 LAB — COMPREHENSIVE METABOLIC PANEL
ALT: 38 U/L (ref 0–44)
AST: 28 U/L (ref 15–41)
Albumin: 3.3 g/dL — ABNORMAL LOW (ref 3.5–5.0)
Alkaline Phosphatase: 103 U/L (ref 38–126)
Anion gap: 7 (ref 5–15)
BUN: 13 mg/dL (ref 6–20)
CO2: 28 mmol/L (ref 22–32)
Calcium: 8.7 mg/dL — ABNORMAL LOW (ref 8.9–10.3)
Chloride: 103 mmol/L (ref 98–111)
Creatinine, Ser: 0.81 mg/dL (ref 0.61–1.24)
GFR, Estimated: >60 mL/min (ref >60)
Glucose, Bld: 118 mg/dL — ABNORMAL HIGH (ref 70–99)
Potassium: 3.9 mmol/L (ref 3.5–5.1)
Sodium: 138 mmol/L (ref 135–145)
Total Bilirubin: 0.4 mg/dL (ref 0.3–1.2)
Total Protein: 6 g/dL — ABNORMAL LOW (ref 6.5–8.1)

## 2021-11-05 LAB — CBC WITH DIFFERENTIAL/PLATELET
Abs Immature Granulocytes: 0.03 10*3/uL (ref 0.00–0.07)
Basophils Absolute: 0.1 10*3/uL (ref 0.0–0.1)
Basophils Relative: 1 %
Eosinophils Absolute: 0.1 10*3/uL (ref 0.0–0.5)
Eosinophils Relative: 1 %
HCT: 34.8 % — ABNORMAL LOW (ref 39.0–52.0)
Hemoglobin: 11.2 g/dL — ABNORMAL LOW (ref 13.0–17.0)
Immature Granulocytes: 0 %
Lymphocytes Relative: 15 %
Lymphs Abs: 1.4 10*3/uL (ref 0.7–4.0)
MCH: 27.9 pg (ref 26.0–34.0)
MCHC: 32.2 g/dL (ref 30.0–36.0)
MCV: 86.6 fL (ref 80.0–100.0)
Monocytes Absolute: 0.6 10*3/uL (ref 0.1–1.0)
Monocytes Relative: 7 %
Neutro Abs: 7 10*3/uL (ref 1.7–7.7)
Neutrophils Relative %: 76 %
Platelets: 355 10*3/uL (ref 150–400)
RBC: 4.02 MIL/uL — ABNORMAL LOW (ref 4.22–5.81)
RDW: 14.3 % (ref 11.5–15.5)
WBC: 9.2 10*3/uL (ref 4.0–10.5)
nRBC: 0 % (ref 0.0–0.2)

## 2021-11-05 LAB — ACETAMINOPHEN LEVEL: Acetaminophen (Tylenol), Serum: <10 ug/mL — ABNORMAL LOW (ref 10–30)

## 2021-11-05 LAB — ETHANOL: Alcohol, Ethyl (B): <10 mg/dL (ref <10)

## 2021-11-05 LAB — CBG MONITORING, ED: Glucose-Capillary: 78 mg/dL (ref 70–99)

## 2021-11-05 MED ORDER — SODIUM CHLORIDE 0.9 % IV BOLUS
1000.0000 mL | Freq: Once | INTRAVENOUS | Status: AC
  Administered 2021-11-05: 1000 mL via INTRAVENOUS

## 2021-11-05 MED ORDER — SODIUM CHLORIDE 0.9 % IV SOLN
12.5000 mg | Freq: Once | INTRAVENOUS | Status: DC
  Filled 2021-11-05: qty 0.5

## 2021-11-05 MED ORDER — SODIUM CHLORIDE 0.9 % IV SOLN
INTRAVENOUS | Status: DC
Start: 2021-11-05 — End: 2021-11-06

## 2021-11-05 MED ORDER — SILVER SULFADIAZINE 1 % EX CREA
TOPICAL_CREAM | Freq: Once | CUTANEOUS | Status: DC
  Filled 2021-11-05: qty 85

## 2021-11-05 MED ORDER — SILVER SULFADIAZINE 1 % EX CREA: TOPICAL_CREAM | Freq: Once | CUTANEOUS | Status: AC

## 2021-11-05 NOTE — ED Notes (Signed)
PT found standing in his room saying he was leaving, pt self removed IV. Pt educated that he needed cream for his back pt agreed to waiting til the cream is ready.
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
## 2021-11-08 ENCOUNTER — Emergency Department (HOSPITAL_COMMUNITY)
Admission: EM | Admit: 2021-11-08 | Discharge: 2021-11-08 | Discharge status: Against Medical Advice | Payer: No Typology Code available for payment source | Attending: Emergency Medicine | Admitting: Emergency Medicine

## 2021-11-08 ENCOUNTER — Other Ambulatory Visit: Payer: Self-pay

## 2021-11-08 ENCOUNTER — Encounter (HOSPITAL_COMMUNITY): Payer: Self-pay | Admitting: Emergency Medicine

## 2021-11-08 DIAGNOSIS — T401X1A Poisoning by heroin, accidental (unintentional), initial encounter: Secondary | ICD-10-CM | POA: Insufficient documentation

## 2021-11-08 DIAGNOSIS — R000 Tachycardia, unspecified: Secondary | ICD-10-CM | POA: Diagnosis not present

## 2021-11-08 DIAGNOSIS — Z5329 Procedure and treatment not carried out because of patient's decision for other reasons: Secondary | ICD-10-CM | POA: Diagnosis not present

## 2021-11-08 MED ORDER — NALOXONE HCL 4 MG/0.1ML NA LIQD
1.0000 | Freq: Once | NASAL | Status: AC
  Administered 2021-11-08: 1 via NASAL
  Filled 2021-11-08: qty 4

## 2021-11-08 NOTE — ED Notes (Signed)
Pt now stating to this RN that he "wants to get my discharge papers."  Explained to pt that he has expressed SI to this RN and that I can't allow him to leave until he talks with a MD.  Pt states "the thoughts have gone, I'm not suicidal, I just think about it sometimes."  Explained again to pt that he has to discuss this with a MD prior to leaving.  Pt refuses to get back in bed and allow staff to attach him to monitors. Staff has direct visualization of pt.

## 2021-11-08 NOTE — Discharge Instructions (Signed)
You are leaving AGAINST MEDICAL ADVICE.  It was recommended you stay and be observed and have further work-up, especially with your elevated heart rate.  If at any point you change your mind or develop new or worsening symptoms, you may return to the ER or call 911.

## 2021-11-08 NOTE — ED Triage Notes (Signed)
Pt to ER via EMS with reports of "accidental" heroin overdose.  Pt had overdose 3 days ago as well.  EMS administered 0.5mg  Narcan IV and pt is now alert and oriented.

## 2021-11-08 NOTE — ED Provider Notes (Signed)
[facility]-EMERGENCY DEPT Provider Note   CSN: [id number] Arrival date & time: 11/08/21  1534     History  Chief Complaint  Patient presents with   Drug Overdose    Cody Poole is a 26 y.o. male.  HPI 26 year old male with a history of substance abuse presents after heroin overdose. Patient was given 0.5 mg IV narcan and is now awake and alert.  When I am seeing patient, he is getting dressed and stating he needs to leave.  He refuses all medical work-up and treatment.  He denies that this was a suicide attempt or that he is suicidal.  He states this was accidental.  Home Medications Prior to Admission medications   Medication Sig Start Date End Date Taking? Authorizing Provider  buprenorphine-naloxone (SUBOXONE) 8-2 mg SUBL SL tablet Place 1 tablet under the tongue in the morning and at bedtime.    [provider]  BUSPIRONE HCL PO Take 1 tablet by mouth daily.    [provider]  clonazePAM (KLONOPIN) 1 MG tablet Take 1 mg by mouth daily.    [provider]  gabapentin (NEURONTIN) 300 MG capsule Take 300 mg by mouth daily.    [provider]  OLANZapine (ZYPREXA) 10 MG tablet Take 10 mg by mouth daily.    [provider]  sertraline (ZOLOFT) 50 MG tablet Take 50 mg by mouth daily.    [provider]      Allergies    Haldol [haloperidol]    Review of Systems   Review of Systems  Respiratory:  Negative for shortness of breath.   Cardiovascular:  Negative for chest pain.  Psychiatric/Behavioral:  Negative for suicidal ideas.     Physical Exam Updated Vital Signs BP 121/71   Pulse (!) 132   Temp 98.2 F (36.8 C) (Oral)   Resp 15   Ht 6' (1.829 m)   Wt 72.6 kg   SpO2 98%   BMI 21.70 kg/m  Physical Exam Vitals and nursing note reviewed.  Constitutional:      Appearance: He is well-developed.  HENT:     Head: Normocephalic and atraumatic.  Cardiovascular:     Rate and Rhythm: Regular  rhythm. Tachycardia present.     Heart sounds: Normal heart sounds.  Pulmonary:     Effort: Pulmonary effort is normal.     Breath sounds: Normal breath sounds.  Abdominal:     General: There is no distension.  Skin:    General: Skin is warm and dry.  Neurological:     Mental Status: He is alert and oriented to person, place, and time.     ED Results / Procedures / Treatments   Labs (all labs ordered are listed, but only abnormal results are displayed) Labs Reviewed - No data to display  EKG None  Radiology No results found.  Procedures Procedures    Medications Ordered in ED Medications  naloxone (NARCAN) nasal spray 4 mg/0.1 mL (has no administration in time range)    ED Course/ Medical Decision Making/ A&P                           Medical Decision Making Risk Prescription drug management.   Patient is refusing all treatment.  He denies SI.  He is noted to be pretty tachycardic and I recommended he stay for work-up, treatment, and monitoring.  Discussed that he could develop a recurrence narcotic overdose if  he leaves too early.  We discussed that this could result in or he could already have organ failure.  He could die if he leaves.  He seems understand all this but still wants to leave AGAINST MEDICAL ADVICE.  The nurse notes that he had originally indicated that he had some SI thoughts but he adamantly denies this to me on multiple times.  I do not think he is an emergent threat to himself, at least not intentionally and I do not think he can be kept here against his will.  Discussed he may return at any time and is encouraged to do so. Will give narcan spray to go home with        Final Clinical Impression(s) / ED Diagnoses Final diagnoses:  Accidental overdose of heroin, initial encounter [facility]    Rx / DC Orders ED Discharge Orders     None         Pricilla Loveless, MD 11/08/21 1705

## 2021-11-08 NOTE — ED Notes (Signed)
Pt has been demanding water to drink since arriving.  Pt has been told multiple times that he is not allowed to have anything until seen by MD and cleared.  Pt has pulled off all monitors and refuses to remain in bed.  Pt attempting to get water from since.  Explained to pt again the need to stay in bed, on monitors and NPO at this time.  Pt states "I don't give a fuck".  Pt provided with water at this time.

## 2021-11-19 ENCOUNTER — Encounter (HOSPITAL_COMMUNITY): Payer: Self-pay

## 2021-11-19 ENCOUNTER — Other Ambulatory Visit: Payer: Self-pay

## 2021-11-19 ENCOUNTER — Emergency Department (HOSPITAL_COMMUNITY)
Admission: EM | Admit: 2021-11-19 | Discharge: 2021-11-20 | Disposition: A | Discharge status: Home / Self Care | Payer: No Typology Code available for payment source | Attending: Emergency Medicine | Admitting: Emergency Medicine

## 2021-11-19 ENCOUNTER — Emergency Department (HOSPITAL_COMMUNITY): Payer: No Typology Code available for payment source

## 2021-11-19 DIAGNOSIS — R079 Chest pain, unspecified: Secondary | ICD-10-CM | POA: Diagnosis not present

## 2021-11-19 DIAGNOSIS — T401X4A Poisoning by heroin, undetermined, initial encounter: Secondary | ICD-10-CM | POA: Insufficient documentation

## 2021-11-19 DIAGNOSIS — F1910 Other psychoactive substance abuse, uncomplicated: Secondary | ICD-10-CM | POA: Insufficient documentation

## 2021-11-19 DIAGNOSIS — T50904A Poisoning by unspecified drugs, medicaments and biological substances, undetermined, initial encounter: Secondary | ICD-10-CM

## 2021-11-19 LAB — RAPID URINE DRUG SCREEN, HOSP PERFORMED
Amphetamines: POSITIVE — AB
Barbiturates: NOT DETECTED
Benzodiazepines: NOT DETECTED
Cocaine: POSITIVE — AB
Opiates: POSITIVE — AB
Tetrahydrocannabinol: POSITIVE — AB

## 2021-11-19 LAB — CBC WITH DIFFERENTIAL/PLATELET
Abs Immature Granulocytes: 0.01 10*3/uL (ref 0.00–0.07)
Basophils Absolute: 0.1 10*3/uL (ref 0.0–0.1)
Basophils Relative: 1 %
Eosinophils Absolute: 0.2 10*3/uL (ref 0.0–0.5)
Eosinophils Relative: 3 %
HCT: 36.9 % — ABNORMAL LOW (ref 39.0–52.0)
Hemoglobin: 11.9 g/dL — ABNORMAL LOW (ref 13.0–17.0)
Immature Granulocytes: 0 %
Lymphocytes Relative: 19 %
Lymphs Abs: 1.2 10*3/uL (ref 0.7–4.0)
MCH: 28 pg (ref 26.0–34.0)
MCHC: 32.2 g/dL (ref 30.0–36.0)
MCV: 86.8 fL (ref 80.0–100.0)
Monocytes Absolute: 0.8 10*3/uL (ref 0.1–1.0)
Monocytes Relative: 12 %
Neutro Abs: 4.2 10*3/uL (ref 1.7–7.7)
Neutrophils Relative %: 65 %
Platelets: 321 10*3/uL (ref 150–400)
RBC: 4.25 MIL/uL (ref 4.22–5.81)
RDW: 14.5 % (ref 11.5–15.5)
WBC: 6.4 10*3/uL (ref 4.0–10.5)
nRBC: 0 % (ref 0.0–0.2)

## 2021-11-19 LAB — SALICYLATE LEVEL: Salicylate Lvl: <7 mg/dL — ABNORMAL LOW (ref 7.0–30.0)

## 2021-11-19 LAB — COMPREHENSIVE METABOLIC PANEL
ALT: 89 U/L — ABNORMAL HIGH (ref 0–44)
AST: 107 U/L — ABNORMAL HIGH (ref 15–41)
Albumin: 3.5 g/dL (ref 3.5–5.0)
Alkaline Phosphatase: 96 U/L (ref 38–126)
Anion gap: 6 (ref 5–15)
BUN: 16 mg/dL (ref 6–20)
CO2: 29 mmol/L (ref 22–32)
Calcium: 8.9 mg/dL (ref 8.9–10.3)
Chloride: 102 mmol/L (ref 98–111)
Creatinine, Ser: 0.79 mg/dL (ref 0.61–1.24)
GFR, Estimated: >60 mL/min (ref >60)
Glucose, Bld: 121 mg/dL — ABNORMAL HIGH (ref 70–99)
Potassium: 3.8 mmol/L (ref 3.5–5.1)
Sodium: 137 mmol/L (ref 135–145)
Total Bilirubin: 0.5 mg/dL (ref 0.3–1.2)
Total Protein: 6.9 g/dL (ref 6.5–8.1)

## 2021-11-19 LAB — ACETAMINOPHEN LEVEL: Acetaminophen (Tylenol), Serum: <10 ug/mL — ABNORMAL LOW (ref 10–30)

## 2021-11-19 LAB — ETHANOL: Alcohol, Ethyl (B): <10 mg/dL (ref <10)

## 2021-11-19 LAB — CBG MONITORING, ED: Glucose-Capillary: 124 mg/dL — ABNORMAL HIGH (ref 70–99)

## 2021-11-19 NOTE — ED Notes (Signed)
Pt attempting to provide urine sample

## 2021-11-19 NOTE — ED Provider Notes (Signed)
[facility]-EM[facility]ENC[facility] DEPT Provider Note   CSN: [id number] Arrival date & time: 11/19/21  2671     History  No chief complaint on file.   Cody Poole is a 26 y.o. male who arrives to the emergency department by EMS due to concern for overdose.  Reported to triage staff that the overdose was an attempt to kill himself.  Somnolent on arrival.  States that he uses heroin.  Denies other drug use.  Complains of chest pain ongoing for at least a month.  Also reports that he is very tired because he has not slept recently.  Currently sleeping outside.  HPI     Home Medications Prior to Admission medications   Medication Sig Start Date End Date Taking? Authorizing Provider  buprenorphine-naloxone (SUBOXONE) 8-2 mg SUBL SL tablet Place 1 tablet under the tongue in the morning and at bedtime.   Yes [provider]  BUSPIRONE HCL PO Take 1 tablet by mouth daily.   Yes [provider]  clonazePAM (KLONOPIN) 1 MG tablet Take 1 mg by mouth daily.   Yes [provider]  gabapentin (NEURONTIN) 300 MG capsule Take 300 mg by mouth daily.   Yes [provider]  OLANZapine (ZYPREXA) 10 MG tablet Take 10 mg by mouth daily.   Yes [provider]  sertraline (ZOLOFT) 50 MG tablet Take 50 mg by mouth daily.   Yes [provider]      Allergies    Haldol [haloperidol]    Review of Systems   Review of Systems  All other systems reviewed and are negative.   Physical Exam Updated Vital Signs BP 126/80   Pulse 68   Temp 98.7 F (37.1 C) (Oral)   Resp 14   Ht 6' (1.829 m)   Wt 73 kg   SpO2 99%   BMI 21.83 kg/m  Physical Exam Vitals and nursing note reviewed.  Constitutional:      General: He is not in acute distress.    Appearance: He is well-developed.  HENT:     Head: Normocephalic and atraumatic.  Eyes:     Conjunctiva/sclera: Conjunctivae normal.  Cardiovascular:     Rate and Rhythm: Normal rate and regular  rhythm.     Heart sounds: No murmur heard. Pulmonary:     Effort: Pulmonary effort is normal. No respiratory distress.     Breath sounds: Normal breath sounds.  Abdominal:     Palpations: Abdomen is soft.     Tenderness: There is no abdominal tenderness.  Musculoskeletal:        General: No swelling.     Cervical back: Neck supple.  Skin:    General: Skin is warm and dry.     Capillary Refill: Capillary refill takes less than 2 seconds.  Neurological:     Mental Status: He is alert.     Comments: Somnolent.  Alert to verbal.  Psychiatric:        Mood and Affect: Mood normal.     ED Results / Procedures / Treatments   Labs (all labs ordered are listed, but only abnormal results are displayed) Labs Reviewed  COMPREHENSIVE METABOLIC PANEL - Abnormal; Notable for the following components:      Result Value   Glucose, Bld 121 (*)    AST 107 (*)    ALT 89 (*)    All other components within normal limits  SALICYLATE LEVEL - Abnormal; Notable for the following components:   Salicylate Lvl <7.0 (*)  All other components within normal limits  ACETAMINOPHEN LEVEL - Abnormal; Notable for the following components:   Acetaminophen (Tylenol), Serum <10 (*)    All other components within normal limits  RAPID URINE DRUG SCREEN, HOSP PERFORMED - Abnormal; Notable for the following components:   Opiates POSITIVE (*)    Cocaine POSITIVE (*)    Amphetamines POSITIVE (*)    Tetrahydrocannabinol POSITIVE (*)    All other components within normal limits  CBC WITH DIFFERENTIAL/PLATELET - Abnormal; Notable for the following components:   Hemoglobin 11.9 (*)    HCT 36.9 (*)    All other components within normal limits  CBG MONITORING, ED - Abnormal; Notable for the following components:   Glucose-Capillary 124 (*)    All other components within normal limits  ETHANOL    EKG None  Radiology DG CHEST PORT 1 VIEW  Result Date: 11/19/2021 CLINICAL DATA:  Found unresponsive EXAM:  PORTABLE CHEST 1 VIEW COMPARISON:  11/05/2021 FINDINGS: The heart size and mediastinal contours are within normal limits. Both lungs are clear. The visualized skeletal structures are unremarkable. IMPRESSION: No active disease. Electronically Signed   By: Sharlet Salina M.D.   On: 11/19/2021 19:57    Procedures Procedures  Continuous cardiac monitoring shows normal sinus rhythm.  Medications Ordered in ED Medications - No data to display  ED Course/ Medical Decision Making/ A&P                           Medical Decision Making Amount and/or Complexity of Data Reviewed Labs: ordered. Radiology: ordered.   Cody Poole is a 26 year old male with a history of substance use disorder who presents by EMS due to concern for heroin overdose.  Naloxone was administered by EMS.  On arrival he is somnolent but breathing well.  Chief complaint is of chest pain for a month.  Exam is unremarkable.  EKG does not show evidence of ischemia.  Will obtain chest x-ray to evaluate patient for pneumonia in setting of overdose and possible aspiration.   Co morbidities that complicate the patient evaluation  Substance use disorder   Social Determinants of Health:  Unhoused.   Additional history obtained:  Additional history and/or information obtained from chart review External records from outside source obtained and reviewed including charts from previous encounters   Lab Tests:  I Ordered (or co-signed), and personally interpreted labs.  The pertinent results include: Ethanol, acetaminophen, salicylate level within normal limits.  CMP notable for mild transaminitis, significantly improved from 1 month ago.  CBC with mild anemia to 11.9 UDS positive for opiates, cocaine, amphetamines, THC   Imaging Studies ordered:  I ordered (or co-signed) imaging studies including CXR I independently visualized and interpreted imaging which showed no acute findings in the chest to explain patient's chest  pain I agree with the radiologist interpretation   Cardiac Monitoring:  The patient was maintained on a cardiac monitor.  The cardiac monitored showed an rhythm of normal sinus rhythm. The patient was also maintained on pulse oximetry. The readings were typically within normal limits.  Reevaluation:  After the interventions noted above, I reevaluated the patient and found that they have :stayed the same.   Disposition: Handoff to oncoming team complete.        Final Clinical Impression(s) / ED Diagnoses Final diagnoses:  Overdose of undetermined intent, initial encounter    Rx / DC Orders ED Discharge Orders     None  Marrianne Mood, MD 11/19/21 4580    Jacalyn Lefevre, MD 11/20/21 [id number]

## 2021-11-19 NOTE — ED Notes (Signed)
Confiscated needle that pt states he used to inject. Needle placed in sharps box.

## 2021-11-19 NOTE — ED Triage Notes (Signed)
[facility] Patient was found unresponsive by a bystander. EMS was called. 0.5 of narcan was given along with 2 mintues of rescue breathing by fire department. Patient is interested in Rehab and states this was in attempt to end his life.   Vital signs were:  150/100 100-HR 98% room air 12-RR 0.5 narcan 2 minutes rescue breathing

## 2021-11-20 ENCOUNTER — Emergency Department (HOSPITAL_COMMUNITY)
Admission: EM | Admit: 2021-11-20 | Discharge: 2021-11-20 | Disposition: A | Discharge status: Home / Self Care | Payer: No Typology Code available for payment source | Source: Home / Self Care

## 2021-11-20 DIAGNOSIS — F1910 Other psychoactive substance abuse, uncomplicated: Secondary | ICD-10-CM | POA: Insufficient documentation

## 2021-11-20 MED ORDER — NALOXONE HCL 2 MG/2ML IJ SOSY
1.0000 mg | PREFILLED_SYRINGE | Freq: Once | INTRAMUSCULAR | Status: DC
  Filled 2021-11-20: qty 2

## 2021-11-20 NOTE — ED Provider Notes (Signed)
Patient is denying SI at this time.  PO challenge and discharge.     Avonte Sensabaugh, MD 11/20/21 0174

## 2021-11-20 NOTE — ED Provider Notes (Signed)
[facility]-EMERGENCY DEPT Provider Note   CSN: [id number] Arrival date & time: 11/20/21  0300     History  No chief complaint on file.   Cody Poole is a 26 y.o. male.   Illness Location:  At home Quality:  Polysubstance abuse Severity:  Moderate Onset quality:  Gradual Timing:  Constant Progression:  Unchanged Chronicity:  Chronic Context:  Polysubstance abuse just discharged after waking up and PO challenging post accidental overdose Relieved by:  Time Worsened by:  Nothing Ineffective treatments:  Nothing Associated symptoms: no nausea and no wheezing   Patient is now presenting post discharge as he would like detox from multiple drugs.  He is NOT suicidal and denies this.  He also also not homicidal.      Home Medications Prior to Admission medications   Medication Sig Start Date End Date Taking? Authorizing Provider  buprenorphine-naloxone (SUBOXONE) 8-2 mg SUBL SL tablet Place 1 tablet under the tongue in the morning and at bedtime.    [provider]  BUSPIRONE HCL PO Take 1 tablet by mouth daily.    [provider]  clonazePAM (KLONOPIN) 1 MG tablet Take 1 mg by mouth daily.    [provider]  gabapentin (NEURONTIN) 300 MG capsule Take 300 mg by mouth daily.    [provider]  OLANZapine (ZYPREXA) 10 MG tablet Take 10 mg by mouth daily.    [provider]  sertraline (ZOLOFT) 50 MG tablet Take 50 mg by mouth daily.    [provider]      Allergies    Haldol [haloperidol]    Review of Systems   Review of Systems  Respiratory:  Negative for wheezing.   Gastrointestinal:  Negative for nausea.  Psychiatric/Behavioral:  Negative for suicidal ideas.   All other systems reviewed and are negative.   Physical Exam Updated Vital Signs BP 132/84 (BP Location: Left Arm)   Pulse 81   Temp 98.6 F (37 C) (Oral)   Resp 17   Ht 6' (1.829 m)   Wt 72.6 kg   SpO2 100%   BMI 21.70 kg/m   Physical Exam Vitals and nursing note reviewed. Exam conducted with a chaperone present.  Constitutional:      General: He is not in acute distress.    Appearance: He is well-developed. He is not diaphoretic.  HENT:     Head: Normocephalic and atraumatic.  Eyes:     Conjunctiva/sclera: Conjunctivae normal.     Pupils: Pupils are equal, round, and reactive to light.  Cardiovascular:     Rate and Rhythm: Normal rate and regular rhythm.  Pulmonary:     Effort: Pulmonary effort is normal.     Breath sounds: Normal breath sounds. No wheezing or rales.  Abdominal:     General: Bowel sounds are normal.     Palpations: Abdomen is soft.     Tenderness: There is no abdominal tenderness. There is no guarding or rebound.  Musculoskeletal:        General: Normal range of motion.     Cervical back: Normal range of motion and neck supple.  Skin:    General: Skin is warm and dry.  Neurological:     General: No focal deficit present.     Mental Status: He is alert and oriented to person, place, and time.     Deep Tendon Reflexes: Reflexes normal.  Psychiatric:        Thought Content: Thought content does not include  homicidal or suicidal ideation.     ED Results / Procedures / Treatments   Labs (all labs ordered are listed, but only abnormal results are displayed) Labs Reviewed - No data to display  EKG None  Radiology DG CHEST PORT 1 VIEW  Result Date: 11/19/2021 CLINICAL DATA:  Found unresponsive EXAM: PORTABLE CHEST 1 VIEW COMPARISON:  11/05/2021 FINDINGS: The heart size and mediastinal contours are within normal limits. Both lungs are clear. The visualized skeletal structures are unremarkable. IMPRESSION: No active disease. Electronically Signed   By: Sharlet Salina M.D.   On: 11/19/2021 19:57    Procedures Procedures    Medications Ordered in ED Medications - No data to display  ED Course/ Medical Decision Making/ A&P                           Medical Decision  Making Just discharge and returns now wanting detox   Amount and/or Complexity of Data Reviewed External Data Reviewed: labs and notes.    Details: previous notes and labs reviewed  Risk Risk Details: We do not do inpatient detox for cocaine methamphetamine and heroin.  The patient is not SI at this time and resources have been given. Stable for discharge.      Final Clinical Impression(s) / ED Diagnoses Final diagnoses:  Polysubstance abuse (HCC)   Return for intractable cough, coughing up blood, fevers > 100.4 unrelieved by medication, shortness of breath, intractable vomiting, chest pain, shortness of breath, weakness, numbness, changes in speech, facial asymmetry, abdominal pain, passing out, Inability to tolerate liquids or food, cough, altered mental status or any concerns. No signs of systemic illness or infection. The patient is nontoxic-appearing on exam and vital signs are within normal limits.  I have reviewed the triage vital signs and the nursing notes. Pertinent labs & imaging results that were available during my care of the patient were reviewed by me and considered in my medical decision making (see chart for details). After history, exam, and medical workup I feel the patient has been appropriately medically screened and is safe for discharge home. Pertinent diagnoses were discussed with the patient. Patient was given return precautions.  Rx / DC Orders ED Discharge Orders     None         Prosper Paff, MD 11/20/21 [phone]

## 2021-11-20 NOTE — ED Notes (Signed)
Denies SI/HI stated he he is homeless and need a place to stay, given resources by provider

## 2021-11-20 NOTE — ED Triage Notes (Signed)
Patient coming to ED for evaluation of requesting detox.  Was just seen and discharged after overdose.  Patient reports no current SI/HI

## 2021-11-20 NOTE — ED Notes (Addendum)
Patient was given a sandwich, crackers and water.

## 2021-11-20 NOTE — ED Notes (Signed)
Patient ambulated to the restroom without assistance 

## 2021-11-23 ENCOUNTER — Encounter (HOSPITAL_COMMUNITY): Payer: Self-pay | Admitting: Emergency Medicine

## 2021-11-23 ENCOUNTER — Emergency Department (HOSPITAL_COMMUNITY)
Admission: EM | Admit: 2021-11-23 | Discharge: 2021-11-23 | Disposition: A | Discharge status: Home / Self Care | Payer: No Typology Code available for payment source | Attending: Emergency Medicine | Admitting: Emergency Medicine

## 2021-11-23 DIAGNOSIS — F1990 Other psychoactive substance use, unspecified, uncomplicated: Secondary | ICD-10-CM

## 2021-11-23 DIAGNOSIS — F1510 Other stimulant abuse, uncomplicated: Secondary | ICD-10-CM | POA: Diagnosis not present

## 2021-11-23 DIAGNOSIS — R000 Tachycardia, unspecified: Secondary | ICD-10-CM | POA: Diagnosis not present

## 2021-11-23 DIAGNOSIS — F1910 Other psychoactive substance abuse, uncomplicated: Secondary | ICD-10-CM | POA: Insufficient documentation

## 2021-11-23 DIAGNOSIS — F1110 Opioid abuse, uncomplicated: Secondary | ICD-10-CM | POA: Diagnosis not present

## 2021-11-23 DIAGNOSIS — R110 Nausea: Secondary | ICD-10-CM | POA: Insufficient documentation

## 2021-11-23 DIAGNOSIS — R55 Syncope and collapse: Secondary | ICD-10-CM | POA: Diagnosis not present

## 2021-11-23 DIAGNOSIS — F1123 Opioid dependence with withdrawal: Secondary | ICD-10-CM | POA: Insufficient documentation

## 2021-11-23 DIAGNOSIS — F1193 Opioid use, unspecified with withdrawal: Secondary | ICD-10-CM

## 2021-11-23 LAB — URINALYSIS, ROUTINE W REFLEX MICROSCOPIC
Bilirubin Urine: NEGATIVE
Glucose, UA: NEGATIVE mg/dL
Hgb urine dipstick: NEGATIVE
Ketones, ur: NEGATIVE mg/dL
Leukocytes,Ua: NEGATIVE
Nitrite: NEGATIVE
Protein, ur: NEGATIVE mg/dL
Specific Gravity, Urine: 1.003 — ABNORMAL LOW (ref 1.005–1.030)
pH: 6 (ref 5.0–8.0)

## 2021-11-23 LAB — BASIC METABOLIC PANEL
Anion gap: 10 (ref 5–15)
BUN: 9 mg/dL (ref 6–20)
CO2: 27 mmol/L (ref 22–32)
Calcium: 9.7 mg/dL (ref 8.9–10.3)
Chloride: 101 mmol/L (ref 98–111)
Creatinine, Ser: 0.74 mg/dL (ref 0.61–1.24)
GFR, Estimated: >60 mL/min (ref >60)
Glucose, Bld: 114 mg/dL — ABNORMAL HIGH (ref 70–99)
Potassium: 4.5 mmol/L (ref 3.5–5.1)
Sodium: 138 mmol/L (ref 135–145)

## 2021-11-23 LAB — CBC
HCT: 44.1 % (ref 39.0–52.0)
Hemoglobin: 14.5 g/dL (ref 13.0–17.0)
MCH: 27.9 pg (ref 26.0–34.0)
MCHC: 32.9 g/dL (ref 30.0–36.0)
MCV: 85 fL (ref 80.0–100.0)
Platelets: 386 10*3/uL (ref 150–400)
RBC: 5.19 MIL/uL (ref 4.22–5.81)
RDW: 14.6 % (ref 11.5–15.5)
WBC: 8.8 10*3/uL (ref 4.0–10.5)
nRBC: 0 % (ref 0.0–0.2)

## 2021-11-23 MED ORDER — SODIUM CHLORIDE 0.9 % IV BOLUS
1000.0000 mL | Freq: Once | INTRAVENOUS | Status: AC
  Administered 2021-11-23: 1000 mL via INTRAVENOUS

## 2021-11-23 MED ORDER — ONDANSETRON 4 MG PO TBDP
8.0000 mg | ORAL_TABLET | Freq: Once | ORAL | Status: AC
  Administered 2021-11-23: 8 mg via ORAL
  Filled 2021-11-23: qty 2

## 2021-11-23 MED ORDER — NAPROXEN 250 MG PO TABS
500.0000 mg | ORAL_TABLET | Freq: Once | ORAL | Status: AC
  Administered 2021-11-23: 500 mg via ORAL
  Filled 2021-11-23: qty 2

## 2021-11-23 MED ORDER — DICYCLOMINE HCL 20 MG PO TABS
20.0000 mg | ORAL_TABLET | Freq: Once | ORAL | Status: AC
  Administered 2021-11-23: 20 mg via ORAL
  Filled 2021-11-23: qty 1

## 2021-11-23 MED ORDER — METHOCARBAMOL 500 MG PO TABS
500.0000 mg | ORAL_TABLET | Freq: Once | ORAL | Status: AC
  Administered 2021-11-23: 500 mg via ORAL
  Filled 2021-11-23: qty 1

## 2021-11-23 MED ORDER — LACTATED RINGERS IV BOLUS: 1000.0000 mL | Freq: Once | INTRAVENOUS | Status: DC

## 2021-11-23 MED ORDER — HYDROXYZINE HCL 25 MG PO TABS
25.0000 mg | ORAL_TABLET | Freq: Once | ORAL | Status: AC
  Administered 2021-11-23: 25 mg via ORAL
  Filled 2021-11-23: qty 1

## 2021-11-23 MED ORDER — CLONIDINE HCL 0.1 MG PO TABS
0.1000 mg | ORAL_TABLET | Freq: Once | ORAL | Status: AC
  Administered 2021-11-23: 0.1 mg via ORAL
  Filled 2021-11-23: qty 1

## 2021-11-23 NOTE — ED Triage Notes (Signed)
Patient BIB [facility] after the patient walked to a fire station and told them that he had passed out earlier. Patient complains of dizziness, is alert, oriented, ambulatory, and in no apparent distress at this time.

## 2021-11-23 NOTE — ED Notes (Signed)
RN reviewed discharge instructions with pt. Pt verbalized understanding and had no further questions. VSS upon discharge.  

## 2021-11-23 NOTE — ED Notes (Signed)
Pt is ambulatory in waiting room with steady gait.

## 2021-11-23 NOTE — Discharge Instructions (Addendum)
It was our pleasure to provide your ER care today - we hope that you feel better.  Drink plenty of fluids/stay well hydrated. Take acetaminophen as need.   Avoid drug use as it is harmful to your physical health and mental well-being. See resource guide provided in terms of pursuing substance use treatment program. See additional info provided in terms of outpatient primary care, behavioral health care, and social service resources.   For mental health issues and/or crisis, you may also go directly to the [facility] - it is open 24/7 and walk-ins are welcome.   Also follow up closely with primary care doctor in the next few weeks.  Return to ER if worse, new symptoms, fevers, chest pain, trouble breathing, or other concern.

## 2021-11-23 NOTE — ED Provider Notes (Signed)
[facility] EMERGENCY DEPARTMENT Provider Note   CSN: [id number] Arrival date & time: 11/23/21  1329     History  Chief Complaint  Patient presents with   Loss of Consciousness    Cody Poole is a 26 y.o. male.  Pt with hx polysubstance use disorder, indicates went to fire station and told them he felt as though he was going to pass out. Pt indicates hx opiate use, none today, and feels as if having withdrawal symptoms. Notes 1-2 episodes diarrhea, nausea. No abd pain or distension. No dysuria or gu c/o. Occasional general muscle aches. No focal area of pain, redness or swelling. Denies headache. No chest pain or discomfort. No sob. No cough. No fever or chills.   The history is provided by the patient, medical records and the EMS personnel.  Loss of Consciousness Associated symptoms: nausea   Associated symptoms: no chest pain, no confusion, no fever, no headaches, no shortness of breath and no weakness        Home Medications Prior to Admission medications   Medication Sig Start Date End Date Taking? Authorizing Provider  buprenorphine-naloxone (SUBOXONE) 8-2 mg SUBL SL tablet Place 1 tablet under the tongue in the morning and at bedtime.    [provider]  BUSPIRONE HCL PO Take 1 tablet by mouth daily.    [provider]  clonazePAM (KLONOPIN) 1 MG tablet Take 1 mg by mouth daily.    [provider]  gabapentin (NEURONTIN) 300 MG capsule Take 300 mg by mouth daily.    [provider]  OLANZapine (ZYPREXA) 10 MG tablet Take 10 mg by mouth daily.    [provider]  sertraline (ZOLOFT) 50 MG tablet Take 50 mg by mouth daily.    [provider]      Allergies    Haldol [haloperidol]    Review of Systems   Review of Systems  Constitutional:  Negative for chills and fever.  HENT:  Negative for sore throat.   Eyes:  Negative for visual disturbance.  Respiratory:  Negative for cough and shortness of  breath.   Cardiovascular:  Positive for syncope. Negative for chest pain.  Gastrointestinal:  Positive for diarrhea and nausea.  Genitourinary:  Negative for dysuria and flank pain.  Musculoskeletal:  Positive for myalgias. Negative for back pain, neck pain and neck stiffness.  Skin:  Negative for rash.  Neurological:  Negative for weakness, numbness and headaches.  Hematological:  Does not bruise/bleed easily.  Psychiatric/Behavioral:  Negative for confusion.     Physical Exam Updated Vital Signs BP 117/64 (BP Location: Right Arm)   Pulse (!) 134   Temp 98 F (36.7 C) (Oral)   Resp 16   SpO2 100%  Physical Exam Vitals and nursing note reviewed.  Constitutional:      Appearance: Normal appearance. He is well-developed.  HENT:     Head: Atraumatic.     Nose: Nose normal.     Mouth/Throat:     Mouth: Mucous membranes are moist.     Pharynx: Oropharynx is clear.  Eyes:     General: No scleral icterus.    Conjunctiva/sclera: Conjunctivae normal.     Pupils: Pupils are equal, round, and reactive to light.     Comments: Pupils mid-dilated, reactive, equal.   Neck:     Trachea: No tracheal deviation.     Comments: No stiffness or rigidity. Thyroid not grossly enlarged or tender. Trachea midline. No l/a or neck mass.  Cardiovascular:     Rate and Rhythm: Regular rhythm. Tachycardia present.     Pulses: Normal pulses.     Heart sounds: Normal heart sounds. No murmur heard.    No friction rub. No gallop.  Pulmonary:     Effort: Pulmonary effort is normal. No accessory muscle usage or respiratory distress.     Breath sounds: Normal breath sounds.  Abdominal:     General: Bowel sounds are normal. There is no distension.     Palpations: Abdomen is soft.     Tenderness: There is no abdominal tenderness. There is no guarding.  Genitourinary:    Comments: No cva tenderness. Musculoskeletal:        General: No swelling.     Cervical back: Normal range of motion and neck supple. No  rigidity.     Comments: CTLS spine, non tender, aligned, no step off. Good rom bil extremities, no focal area of pain, erythema, tenderness or swelling.   Skin:    General: Skin is warm and dry.     Findings: No rash.  Neurological:     Mental Status: He is alert.     Comments: Alert, speech clear. Motor/sens grossly intact bil. No tremor or shakes.   Psychiatric:        Mood and Affect: Mood normal.     Comments: Normal mood and affect. Pt does not appear acutely depressed or despondent. No SI/HI. Pt does not appear to be responding to internal stimuli, thought process are clear.      ED Results / Procedures / Treatments   Labs (all labs ordered are listed, but only abnormal results are displayed) Results for orders placed or performed during the hospital encounter of 11/23/21  Basic metabolic panel  Result Value Ref Range   Sodium 138 135 - 145 mmol/L   Potassium 4.5 3.5 - 5.1 mmol/L   Chloride 101 98 - 111 mmol/L   CO2 27 22 - 32 mmol/L   Glucose, Bld 114 (H) 70 - 99 mg/dL   BUN 9 6 - 20 mg/dL   Creatinine, Ser 7.89 0.61 - 1.24 mg/dL   Calcium 9.7 8.9 - 38.1 mg/dL   GFR, Estimated >01 >75 mL/min   Anion gap 10 5 - 15  CBC  Result Value Ref Range   WBC 8.8 4.0 - 10.5 K/uL   RBC 5.19 4.22 - 5.81 MIL/uL   Hemoglobin 14.5 13.0 - 17.0 g/dL   HCT 10.2 58.5 - 27.7 %   MCV 85.0 80.0 - 100.0 fL   MCH 27.9 26.0 - 34.0 pg   MCHC 32.9 30.0 - 36.0 g/dL   RDW 82.4 23.5 - 36.1 %   Platelets 386 150 - 400 K/uL   nRBC 0.0 0.0 - 0.2 %   DG CHEST PORT 1 VIEW  Result Date: 11/19/2021 CLINICAL DATA:  Found unresponsive EXAM: PORTABLE CHEST 1 VIEW COMPARISON:  11/05/2021 FINDINGS: The heart size and mediastinal contours are within normal limits. Both lungs are clear. The visualized skeletal structures are unremarkable. IMPRESSION: No active disease. Electronically Signed   By: Sharlet Salina M.D.   On: 11/19/2021 19:57   DG Chest Portable 1 View  Result Date: 11/05/2021 CLINICAL DATA:   Drug overdose. EXAM: PORTABLE CHEST 1 VIEW COMPARISON:  Portable chest 10/15/2021. FINDINGS: The heart size and mediastinal contours are within normal limits. Both lungs are clear. The visualized skeletal structures are unremarkable. There are multiple overlying monitor wires. IMPRESSION: No active disease.  Stable chest. Electronically Signed  By: Almira Bar M.D.   On: 11/05/2021 22:08   CT ABDOMEN PELVIS W CONTRAST  Result Date: 10/31/2021 CLINICAL DATA:  3 day history of abdominal pain. EXAM: CT ABDOMEN AND PELVIS WITH CONTRAST TECHNIQUE: Multidetector CT imaging of the abdomen and pelvis was performed using the standard protocol following bolus administration of intravenous contrast. RADIATION DOSE REDUCTION: This exam was performed according to the departmental dose-optimization program which includes automated exposure control, adjustment of the mA and/or kV according to patient size and/or use of iterative reconstruction technique. CONTRAST:  OMNIPAQUE IOHEXOL 300 MG/ML  SOLN COMPARISON:  10/13/2020 FINDINGS: Lower chest: Stable tiny perifissural nodule right lower lobe consistent with subpleural lymph node. Hepatobiliary: No suspicious focal abnormality within the liver parenchyma. There is no evidence for gallstones, gallbladder wall thickening, or pericholecystic fluid. No intrahepatic or extrahepatic biliary dilation. Pancreas: No focal mass lesion. No dilatation of the main duct. No intraparenchymal cyst. No peripancreatic edema. Spleen: No splenomegaly. No focal mass lesion. Adrenals/Urinary Tract: No adrenal nodule or mass. Kidneys unremarkable. No evidence for hydroureter. The urinary bladder appears normal for the degree of distention. Stomach/Bowel: Stomach is decompressed. Duodenum is normally positioned as is the ligament of Treitz. No small bowel wall thickening. No small bowel dilatation. The terminal ileum is normal. The appendix is not well visualized, but there is no edema or  inflammation in the region of the cecum. No gross colonic mass. No colonic wall thickening. Vascular/Lymphatic: No abdominal aortic aneurysm. There is no gastrohepatic or hepatoduodenal ligament lymphadenopathy. No retroperitoneal or mesenteric lymphadenopathy. No pelvic sidewall lymphadenopathy. Reproductive: The prostate gland and seminal vesicles are unremarkable. Other: No intraperitoneal free fluid. Musculoskeletal: No worrisome lytic or sclerotic osseous abnormality. IMPRESSION: No acute findings in the abdomen or pelvis. Specifically, no findings to explain the patient's history of abdominal pain. Electronically Signed   By: Kennith Center M.D.   On: 10/31/2021 06:08      EKG EKG Interpretation  Date/Time:  Sunday November 23 2021 17:32:45 EDT Ventricular Rate:  107 PR Interval:  206 QRS Duration: 98 QT Interval:  412 QTC Calculation: 550 R Axis:   88 Text Interpretation: Sinus tachycardia Non-specific ST-t changes `similar st/t appearance ecg 10/16/2021 Confirmed by [name] ([id number]) on 11/23/2021 5:41:05 PM  Radiology No results found.  Procedures Procedures    Medications Ordered in ED Medications  dicyclomine (BENTYL) tablet 20 mg (has no administration in time range)  hydrOXYzine (ATARAX) tablet 25 mg (has no administration in time range)  methocarbamol (ROBAXIN) tablet 500 mg (has no administration in time range)  naproxen (NAPROSYN) tablet 500 mg (has no administration in time range)  ondansetron (ZOFRAN-ODT) disintegrating tablet 8 mg (has no administration in time range)  cloNIDine (CATAPRES) tablet 0.1 mg (has no administration in time range)    ED Course/ Medical Decision Making/ A&P                           Medical Decision Making Amount and/or Complexity of Data Reviewed Labs: ordered. ECG/medicine tests: ordered.  Risk Prescription drug management.   Iv ns. Continuous pulse ox and cardiac monitoring. Labs ordered/sent.   Reviewed nursing notes and  prior charts for additional history. External reports reviewed. Additional history from: EMS.   Cardiac monitor: sinus rhythm, rate 100.  Labs reviewed/interpreted by me - wbc and hgb normal. Chem normal.   Recent CT reviewed/interpreted by me - no acute abd process.  Meds given for symptom improvement.  Ns bolus. Po fluids/food.  Recheck pt, tolerating po, comfortable appearing no acute distress. Hr 90, rr 16. No chest pain. No sob. No abd pain or nv.   Pt encourage to pursue sobriety, substance use treatment program - resources/guide provided.   Return precautions provided.            Final Clinical Impression(s) / ED Diagnoses Final diagnoses:  None    Rx / DC Orders ED Discharge Orders     None         [name], MD 11/23/21 [phone]

## 2021-11-24 ENCOUNTER — Emergency Department (HOSPITAL_COMMUNITY): Payer: No Typology Code available for payment source

## 2021-11-24 ENCOUNTER — Other Ambulatory Visit: Payer: Self-pay

## 2021-11-24 ENCOUNTER — Encounter (HOSPITAL_COMMUNITY): Payer: Self-pay

## 2021-11-24 ENCOUNTER — Observation Stay (HOSPITAL_COMMUNITY)
Admission: EM | Admit: 2021-11-24 | Discharge: 2021-11-26 | Disposition: A | Discharge status: Home / Self Care | Payer: No Typology Code available for payment source | Attending: Family Medicine | Admitting: Family Medicine

## 2021-11-24 DIAGNOSIS — F1910 Other psychoactive substance abuse, uncomplicated: Secondary | ICD-10-CM | POA: Diagnosis not present

## 2021-11-24 DIAGNOSIS — F17200 Nicotine dependence, unspecified, uncomplicated: Secondary | ICD-10-CM | POA: Insufficient documentation

## 2021-11-24 DIAGNOSIS — Z20822 Contact with and (suspected) exposure to covid-19: Secondary | ICD-10-CM | POA: Diagnosis not present

## 2021-11-24 DIAGNOSIS — F200 Paranoid schizophrenia: Secondary | ICD-10-CM | POA: Insufficient documentation

## 2021-11-24 DIAGNOSIS — F1110 Opioid abuse, uncomplicated: Secondary | ICD-10-CM | POA: Insufficient documentation

## 2021-11-24 DIAGNOSIS — F419 Anxiety disorder, unspecified: Secondary | ICD-10-CM | POA: Diagnosis not present

## 2021-11-24 DIAGNOSIS — R9431 Abnormal electrocardiogram [ECG] [EKG]: Secondary | ICD-10-CM

## 2021-11-24 DIAGNOSIS — R7401 Elevation of levels of liver transaminase levels: Secondary | ICD-10-CM | POA: Diagnosis present

## 2021-11-24 DIAGNOSIS — R45851 Suicidal ideations: Secondary | ICD-10-CM

## 2021-11-24 DIAGNOSIS — Z59819 Housing instability, housed unspecified: Secondary | ICD-10-CM | POA: Diagnosis not present

## 2021-11-24 DIAGNOSIS — R748 Abnormal levels of other serum enzymes: Secondary | ICD-10-CM

## 2021-11-24 DIAGNOSIS — R079 Chest pain, unspecified: Secondary | ICD-10-CM

## 2021-11-24 DIAGNOSIS — F209 Schizophrenia, unspecified: Secondary | ICD-10-CM | POA: Diagnosis present

## 2021-11-24 LAB — COMPREHENSIVE METABOLIC PANEL
ALT: 1238 U/L — ABNORMAL HIGH (ref 0–44)
AST: 916 U/L — ABNORMAL HIGH (ref 15–41)
Albumin: 3.1 g/dL — ABNORMAL LOW (ref 3.5–5.0)
Alkaline Phosphatase: 137 U/L — ABNORMAL HIGH (ref 38–126)
Anion gap: 7 (ref 5–15)
BUN: 11 mg/dL (ref 6–20)
CO2: 26 mmol/L (ref 22–32)
Calcium: 9.2 mg/dL (ref 8.9–10.3)
Chloride: 105 mmol/L (ref 98–111)
Creatinine, Ser: 0.72 mg/dL (ref 0.61–1.24)
GFR, Estimated: >60 mL/min (ref >60)
Glucose, Bld: 108 mg/dL — ABNORMAL HIGH (ref 70–99)
Potassium: 4 mmol/L (ref 3.5–5.1)
Sodium: 138 mmol/L (ref 135–145)
Total Bilirubin: 1.5 mg/dL — ABNORMAL HIGH (ref 0.3–1.2)
Total Protein: 6.5 g/dL (ref 6.5–8.1)

## 2021-11-24 LAB — RAPID URINE DRUG SCREEN, HOSP PERFORMED
Amphetamines: POSITIVE — AB
Barbiturates: NOT DETECTED
Benzodiazepines: NOT DETECTED
Cocaine: NOT DETECTED
Opiates: NOT DETECTED
Tetrahydrocannabinol: POSITIVE — AB

## 2021-11-24 LAB — CBC WITH DIFFERENTIAL/PLATELET
Abs Immature Granulocytes: 0.02 10*3/uL (ref 0.00–0.07)
Basophils Absolute: 0.1 10*3/uL (ref 0.0–0.1)
Basophils Relative: 1 %
Eosinophils Absolute: 0.1 10*3/uL (ref 0.0–0.5)
Eosinophils Relative: 1 %
HCT: 41.6 % (ref 39.0–52.0)
Hemoglobin: 13.6 g/dL (ref 13.0–17.0)
Immature Granulocytes: 0 %
Lymphocytes Relative: 24 %
Lymphs Abs: 2.1 10*3/uL (ref 0.7–4.0)
MCH: 28 pg (ref 26.0–34.0)
MCHC: 32.7 g/dL (ref 30.0–36.0)
MCV: 85.6 fL (ref 80.0–100.0)
Monocytes Absolute: 0.7 10*3/uL (ref 0.1–1.0)
Monocytes Relative: 8 %
Neutro Abs: 5.8 10*3/uL (ref 1.7–7.7)
Neutrophils Relative %: 66 %
Platelets: 350 10*3/uL (ref 150–400)
RBC: 4.86 MIL/uL (ref 4.22–5.81)
RDW: 14.7 % (ref 11.5–15.5)
WBC: 8.6 10*3/uL (ref 4.0–10.5)
nRBC: 0 % (ref 0.0–0.2)

## 2021-11-24 LAB — HEPATIC FUNCTION PANEL
ALT: 1217 U/L — ABNORMAL HIGH (ref 0–44)
AST: 805 U/L — ABNORMAL HIGH (ref 15–41)
Albumin: 3.1 g/dL — ABNORMAL LOW (ref 3.5–5.0)
Alkaline Phosphatase: 133 U/L — ABNORMAL HIGH (ref 38–126)
Bilirubin, Direct: 0.5 mg/dL — ABNORMAL HIGH (ref 0.0–0.2)
Indirect Bilirubin: 0.6 mg/dL (ref 0.3–0.9)
Total Bilirubin: 1.1 mg/dL (ref 0.3–1.2)
Total Protein: 6.6 g/dL (ref 6.5–8.1)

## 2021-11-24 LAB — ETHANOL: Alcohol, Ethyl (B): <10 mg/dL (ref <10)

## 2021-11-24 LAB — RESP PANEL BY RT-PCR (FLU A&B, COVID) ARPGX2
Influenza A by PCR: NEGATIVE
Influenza B by PCR: NEGATIVE
SARS Coronavirus 2 by RT PCR: NEGATIVE

## 2021-11-24 LAB — ACETAMINOPHEN LEVEL: Acetaminophen (Tylenol), Serum: <10 ug/mL — ABNORMAL LOW (ref 10–30)

## 2021-11-24 LAB — HEPATITIS PANEL, ACUTE
HCV Ab: REACTIVE — AB
Hep A IgM: NONREACTIVE
Hep B C IgM: NONREACTIVE
Hepatitis B Surface Ag: NONREACTIVE

## 2021-11-24 LAB — PROTIME-INR
INR: 1.1 (ref 0.8–1.2)
Prothrombin Time: 14.3 seconds (ref 11.4–15.2)

## 2021-11-24 LAB — LIPASE, BLOOD: Lipase: 28 U/L (ref 11–51)

## 2021-11-24 LAB — SALICYLATE LEVEL: Salicylate Lvl: <7 mg/dL — ABNORMAL LOW (ref 7.0–30.0)

## 2021-11-24 MED ORDER — LOPERAMIDE HCL 2 MG PO CAPS
2.0000 mg | ORAL_CAPSULE | ORAL | Status: DC | PRN
  Administered 2021-11-25: 4 mg via ORAL
  Filled 2021-11-24: qty 2

## 2021-11-24 MED ORDER — ENOXAPARIN SODIUM 40 MG/0.4ML IJ SOSY
40.0000 mg | PREFILLED_SYRINGE | INTRAMUSCULAR | Status: DC
  Administered 2021-11-25: 40 mg via SUBCUTANEOUS
  Filled 2021-11-24: qty 0.4

## 2021-11-24 MED ORDER — ENOXAPARIN SODIUM 40 MG/0.4ML IJ SOSY: 40.0000 mg | PREFILLED_SYRINGE | INTRAMUSCULAR | Status: DC

## 2021-11-24 MED ORDER — CLONIDINE HCL 0.1 MG PO TABS: 0.1000 mg | ORAL_TABLET | Freq: Every day | ORAL | Status: DC

## 2021-11-24 MED ORDER — ONDANSETRON 4 MG PO TBDP
4.0000 mg | ORAL_TABLET | Freq: Four times a day (QID) | ORAL | Status: DC | PRN
  Administered 2021-11-24: 4 mg via ORAL
  Filled 2021-11-24: qty 1

## 2021-11-24 MED ORDER — HYDROXYZINE HCL 25 MG PO TABS
25.0000 mg | ORAL_TABLET | Freq: Three times a day (TID) | ORAL | Status: DC | PRN
  Administered 2021-11-24: 25 mg via ORAL
  Filled 2021-11-24: qty 1

## 2021-11-24 MED ORDER — DICYCLOMINE HCL 20 MG PO TABS
20.0000 mg | ORAL_TABLET | Freq: Four times a day (QID) | ORAL | Status: DC | PRN
  Administered 2021-11-25 – 2021-11-26 (×3): 20 mg via ORAL
  Filled 2021-11-24 (×3): qty 1

## 2021-11-24 MED ORDER — CLONIDINE HCL 0.1 MG PO TABS
0.1000 mg | ORAL_TABLET | Freq: Four times a day (QID) | ORAL | Status: AC
  Administered 2021-11-24 – 2021-11-26 (×7): 0.1 mg via ORAL
  Filled 2021-11-24 (×7): qty 1

## 2021-11-24 MED ORDER — METHOCARBAMOL 500 MG PO TABS
500.0000 mg | ORAL_TABLET | Freq: Three times a day (TID) | ORAL | Status: DC | PRN
  Administered 2021-11-24 – 2021-11-26 (×4): 500 mg via ORAL
  Filled 2021-11-24 (×4): qty 1

## 2021-11-24 MED ORDER — NAPROXEN 250 MG PO TABS
500.0000 mg | ORAL_TABLET | Freq: Two times a day (BID) | ORAL | Status: DC | PRN
  Administered 2021-11-24 – 2021-11-25 (×2): 500 mg via ORAL
  Filled 2021-11-24 (×2): qty 2

## 2021-11-24 MED ORDER — CLONIDINE HCL 0.1 MG PO TABS: 0.1000 mg | ORAL_TABLET | Freq: Two times a day (BID) | ORAL | Status: DC

## 2021-11-24 MED ORDER — IBUPROFEN 400 MG PO TABS
600.0000 mg | ORAL_TABLET | Freq: Once | ORAL | Status: AC
  Administered 2021-11-24: 600 mg via ORAL
  Filled 2021-11-24: qty 1

## 2021-11-24 MED ORDER — NICOTINE 21 MG/24HR TD PT24
21.0000 mg | MEDICATED_PATCH | Freq: Every day | TRANSDERMAL | Status: DC
  Administered 2021-11-24 – 2021-11-26 (×3): 21 mg via TRANSDERMAL
  Filled 2021-11-24 (×3): qty 1

## 2021-11-24 NOTE — ED Notes (Signed)
LFT"s 916, 1238   Sharilyn Sites [place], [place] 11/24/21 [phone]

## 2021-11-24 NOTE — Assessment & Plan Note (Addendum)
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 Has not been taking home meds as prescribed. -Psych consulted -Start Zyprexa 2.5mg  BID

## 2021-11-24 NOTE — ED Provider Notes (Signed)
[facility] EMERGENCY DEPARTMENT Provider Note   CSN: [id number] Arrival date & time: 11/24/21  0234     History  Chief Complaint  Patient presents with   Suicidal    Cody Poole is a 26 y.o. male.  26 year old male presents with suicidal ideation with plan to drink bleach.  Patient states that he is hopeful to get into a program.  Reports IV drug abuse, denies alcohol use.  States he has some left-sided abdominal discomfort without nausea, vomiting, changes in bowel or bladder habits, fevers, chills.  That he did drink bleach a few months ago but has not had any today.  Also reports feeling homicidal against anyone who tries to harm him.       Home Medications Prior to Admission medications   Medication Sig Start Date End Date Taking? Authorizing Provider  buprenorphine-naloxone (SUBOXONE) 8-2 mg SUBL SL tablet Place 1 tablet under the tongue in the morning and at bedtime.    [provider]  BUSPIRONE HCL PO Take 1 tablet by mouth daily.    [provider]  clonazePAM (KLONOPIN) 1 MG tablet Take 1 mg by mouth daily.    [provider]  gabapentin (NEURONTIN) 300 MG capsule Take 300 mg by mouth daily.    [provider]  OLANZapine (ZYPREXA) 10 MG tablet Take 10 mg by mouth daily.    [provider]  sertraline (ZOLOFT) 50 MG tablet Take 50 mg by mouth daily.    [provider]      Allergies    Haldol [haloperidol]    Review of Systems   Review of Systems Negative except as per HPI Physical Exam Updated Vital Signs BP 110/72   Pulse 72   Temp 98.1 F (36.7 C)   Resp 16   Ht 6' (1.829 m)   Wt 72.6 kg   SpO2 100%   BMI 21.71 kg/m  Physical Exam Vitals and nursing note reviewed.  Constitutional:      General: He is not in acute distress.    Appearance: He is well-developed. He is not diaphoretic.  HENT:     Head: Normocephalic and atraumatic.     Nose: Nose normal.     Mouth/Throat:      Mouth: Mucous membranes are moist.  Eyes:     Conjunctiva/sclera: Conjunctivae normal.  Cardiovascular:     Rate and Rhythm: Normal rate and regular rhythm.     Pulses: Normal pulses.     Heart sounds: Normal heart sounds.  Pulmonary:     Effort: Pulmonary effort is normal.     Breath sounds: Normal breath sounds.  Abdominal:     Palpations: Abdomen is soft.     Tenderness: There is no abdominal tenderness.  Skin:    General: Skin is warm and dry.     Findings: No erythema or rash.  Neurological:     Mental Status: He is alert and oriented to person, place, and time.  Psychiatric:        Mood and Affect: Affect is tearful.        Behavior: Behavior normal.        Thought Content: Thought content includes homicidal and suicidal ideation. Thought content includes suicidal plan. Thought content does not include homicidal plan.     ED Results / Procedures / Treatments   Labs (all labs ordered are listed, but only abnormal results are displayed) Labs Reviewed  COMPREHENSIVE METABOLIC PANEL - Abnormal; Notable for the following  components:      Result Value   Glucose, Bld 108 (*)    Albumin 3.1 (*)    AST 916 (*)    ALT 1,238 (*)    Alkaline Phosphatase 137 (*)    Total Bilirubin 1.5 (*)    All other components within normal limits  RAPID URINE DRUG SCREEN, HOSP PERFORMED - Abnormal; Notable for the following components:   Amphetamines POSITIVE (*)    Tetrahydrocannabinol POSITIVE (*)    All other components within normal limits  ACETAMINOPHEN LEVEL - Abnormal; Notable for the following components:   Acetaminophen (Tylenol), Serum <10 (*)    All other components within normal limits  SALICYLATE LEVEL - Abnormal; Notable for the following components:   Salicylate Lvl <3.5 (*)    All other components within normal limits  RESP PANEL BY RT-PCR (FLU A&B, COVID) ARPGX2  CBC WITH DIFFERENTIAL/PLATELET  ETHANOL  LIPASE, BLOOD  HEPATITIS PANEL, ACUTE  ANA W/REFLEX IF POSITIVE   MITOCHONDRIAL ANTIBODIES  ANTI-SMOOTH MUSCLE ANTIBODY, IGG  IGG  CERULOPLASMIN  ALPHA-1-ANTITRYPSIN  PROTIME-INR    EKG None  Radiology US Abdomen Limited  Result Date: 11/24/2021 CLINICAL DATA:  Elevated LFTs. EXAM: ULTRASOUND ABDOMEN LIMITED RIGHT UPPER QUADRANT COMPARISON:  CT scan 10/31/2021 FINDINGS: Gallbladder: No gallstones or wall thickening visualized. No sonographic Locklan Canoy sign noted by sonographer. Common bile duct: Diameter: 3 mm Liver: No focal lesion identified. Within normal limits in parenchymal echogenicity. Portal vein is patent on color Doppler imaging with normal direction of blood flow towards the liver. Other: Small volume free fluid noted adjacent to the liver. IMPRESSION: Small volume perihepatic free fluid. Otherwise unremarkable right upper quadrant ultrasound. Electronically Signed   By: Misty Stanley M.D.   On: 11/24/2021 07:47    Procedures Procedures    Medications Ordered in ED Medications  cloNIDine (CATAPRES) tablet 0.1 mg (has no administration in time range)    Followed by  cloNIDine (CATAPRES) tablet 0.1 mg (has no administration in time range)    Followed by  cloNIDine (CATAPRES) tablet 0.1 mg (has no administration in time range)  dicyclomine (BENTYL) tablet 20 mg (has no administration in time range)  loperamide (IMODIUM) capsule 2-4 mg (has no administration in time range)  methocarbamol (ROBAXIN) tablet 500 mg (has no administration in time range)  naproxen (NAPROSYN) tablet 500 mg (has no administration in time range)  ondansetron (ZOFRAN-ODT) disintegrating tablet 4 mg (has no administration in time range)  ibuprofen (ADVIL) tablet 600 mg (600 mg Oral Given 11/24/21 1421)    ED Course/ Medical Decision Making/ A&P Clinical Course as of 11/24/21 1529  Mon Nov 24, 2021  1511 GI saw, plan to admit for further WU, trend LFTs, possible needs ID ref [BH]    Clinical Course User Index [BH] Henderly, Britni A, PA-C                            Medical Decision Making Amount and/or Complexity of Data Reviewed Labs: ordered. Radiology: ordered.  Risk Prescription drug management.   This patient presents to the ED for concern of suicidal ideation with left-sided abdominal pain, this involves an extensive number of treatment options, and is a complaint that carries with it a high risk of complications and morbidity.  The differential diagnosis includes but not limited to ingestion of foreign substance although patient denies, psychosis   Co morbidities that complicate the patient evaluation  Schizophrenia, hepatitis C, polysubstance abuse  Additional history obtained:  External records from outside source obtained and reviewed including recent ER evaluation for polysubstance abuse, accidental overdose as well as ingestion of bleach with admission from 10/14/2021   Lab Tests:  I Ordered, and personally interpreted labs.  The pertinent results include: CBC unremarkable, CMP with elevated AST of 916, ALT 1000 238, alk phos 137, total bili 1.5.  Aspirin and Tylenol levels are negative.  Alcohol negative.  UDS positive for amphetamines and marijuana.  Lipase normal.   Imaging Studies ordered:  I ordered imaging studies including right upper quadrant ultrasound I independently visualized and interpreted imaging which showed small volume perihepatic free fluid I agree with the radiologist interpretation   Cardiac Monitoring: / EKG:  The patient was maintained on a cardiac monitor.  I personally viewed and interpreted the cardiac monitored which showed an underlying rhythm of: obtained yesterday, order for repeat placed at time of sign out   Consultations Obtained:  I requested consultation with the [facility], Azucena Freed, PA-C,  and discussed lab and imaging findings as well as pertinent plan - they recommend: admit to trend LFTs, check INR.    Problem List / ED Course / Critical interventions / Medication  management  26 year old male presents the emergency room with concern for homicidal and suicidal ideation with plan to drink bleach.  States that he did this a month a month ago.  Discussed patient's elevated LFTs with him, add on abdominal ultrasound is without acute findings.  Patient seems unaware of his elevated LFTs however on chart review, he was previously admitted on 10/15/2021 after ingesting bleach.  Patient was seen by [facility] at that time for his elevated LFTs and T. bili.  Given NAC for bleach ingestion.  Patient was observed, LFTs improved and he was ultimately discharged. I ordered medication including Motrin, hydroxyzine for anxiety, pain Reevaluation of the patient after these medicines showed that the patient stayed the same I have reviewed the patients home medicines and have made adjustments as needed   Social Determinants of Health:  PCP [facility]   Test / Admission - Considered:  Plan to admit for LFT trend and further behavioral health treatment.         Final Clinical Impression(s) / ED Diagnoses Final diagnoses:  Suicidal ideation  Transaminitis    Rx / DC Orders ED Discharge Orders     None         Tacy Learn, PA-C 11/24/21 1529    Long, Wonda Old[place], MD 11/26/21 [phone]

## 2021-11-24 NOTE — ED Provider Notes (Signed)
Care assumed from previous provider.  See note for full HPI.  In summation 26 year old here for evaluation of suicide occasion.  Prior attempt by drinking bleach, required hospitalist admission for elevated LFTs was seen by GI at that time.  Patient states he is suicidal with thoughts of plan to drink bleach however did not actually drink any.  He has history of IV drug use.  Was found to have significantly elevated LFTs which were up prior from discharge.  [place] [facility] saw patient in consult who recommend medicine admission for trending LFTs, additional work-up.  Possibly needs ID consult for possible hepatitis C?   Currently under IVC for suicidal ideation  Physical Exam  BP 110/72   Pulse 72   Temp 98.1 F (36.7 C)   Resp 16   Ht 6' (1.829 m)   Wt 72.6 kg   SpO2 100%   BMI 21.71 kg/m   Physical Exam Vitals and nursing note reviewed.  Constitutional:      General: He is not in acute distress.    Appearance: He is well-developed. He is not ill-appearing, toxic-appearing or diaphoretic.  HENT:     Head: Atraumatic.  Eyes:     Pupils: Pupils are equal, round, and reactive to light.  Cardiovascular:     Rate and Rhythm: Normal rate and regular rhythm.  Pulmonary:     Effort: Pulmonary effort is normal. No respiratory distress.  Abdominal:     General: There is no distension.     Palpations: Abdomen is soft.  Musculoskeletal:        General: Normal range of motion.     Cervical back: Normal range of motion and neck supple.  Skin:    General: Skin is warm and dry.  Neurological:     General: No focal deficit present.     Mental Status: He is alert and oriented to person, place, and time.    Procedures  Procedures Labs Reviewed  COMPREHENSIVE METABOLIC PANEL - Abnormal; Notable for the following components:      Result Value   Glucose, Bld 108 (*)    Albumin 3.1 (*)    AST 916 (*)    ALT 1,238 (*)    Alkaline Phosphatase 137 (*)    Total Bilirubin 1.5 (*)    All  other components within normal limits  RAPID URINE DRUG SCREEN, HOSP PERFORMED - Abnormal; Notable for the following components:   Amphetamines POSITIVE (*)    Tetrahydrocannabinol POSITIVE (*)    All other components within normal limits  ACETAMINOPHEN LEVEL - Abnormal; Notable for the following components:   Acetaminophen (Tylenol), Serum <10 (*)    All other components within normal limits  SALICYLATE LEVEL - Abnormal; Notable for the following components:   Salicylate Lvl <7.0 (*)    All other components within normal limits  RESP PANEL BY RT-PCR (FLU A&B, COVID) ARPGX2  CBC WITH DIFFERENTIAL/PLATELET  ETHANOL  LIPASE, BLOOD  HEPATITIS PANEL, ACUTE  ANA W/REFLEX IF POSITIVE  MITOCHONDRIAL ANTIBODIES  ANTI-SMOOTH MUSCLE ANTIBODY, IGG  IGG  CERULOPLASMIN  ALPHA-1-ANTITRYPSIN  PROTIME-INR   US Abdomen Limited  Result Date: 11/24/2021 CLINICAL DATA:  Elevated LFTs. EXAM: ULTRASOUND ABDOMEN LIMITED RIGHT UPPER QUADRANT COMPARISON:  CT scan 10/31/2021 FINDINGS: Gallbladder: No gallstones or wall thickening visualized. No sonographic Murphy sign noted by sonographer. Common bile duct: Diameter: 3 mm Liver: No focal lesion identified. Within normal limits in parenchymal echogenicity. Portal vein is patent on color Doppler imaging with normal direction of blood  flow towards the liver. Other: Small volume free fluid noted adjacent to the liver. IMPRESSION: Small volume perihepatic free fluid. Otherwise unremarkable right upper quadrant ultrasound. Electronically Signed   By: Kennith Center M.D.   On: 11/24/2021 07:47   DG CHEST PORT 1 VIEW  Result Date: 11/19/2021 CLINICAL DATA:  Found unresponsive EXAM: PORTABLE CHEST 1 VIEW COMPARISON:  11/05/2021 FINDINGS: The heart size and mediastinal contours are within normal limits. Both lungs are clear. The visualized skeletal structures are unremarkable. IMPRESSION: No active disease. Electronically Signed   By: Sharlet Salina M.D.   On: 11/19/2021  19:57   DG Chest Portable 1 View  Result Date: 11/05/2021 CLINICAL DATA:  Drug overdose. EXAM: PORTABLE CHEST 1 VIEW COMPARISON:  Portable chest 10/15/2021. FINDINGS: The heart size and mediastinal contours are within normal limits. Both lungs are clear. The visualized skeletal structures are unremarkable. There are multiple overlying monitor wires. IMPRESSION: No active disease.  Stable chest. Electronically Signed   By: Almira Bar M.D.   On: 11/05/2021 22:08   CT ABDOMEN PELVIS W CONTRAST  Result Date: 10/31/2021 CLINICAL DATA:  3 day history of abdominal pain. EXAM: CT ABDOMEN AND PELVIS WITH CONTRAST TECHNIQUE: Multidetector CT imaging of the abdomen and pelvis was performed using the standard protocol following bolus administration of intravenous contrast. RADIATION DOSE REDUCTION: This exam was performed according to the departmental dose-optimization program which includes automated exposure control, adjustment of the mA and/or kV according to patient size and/or use of iterative reconstruction technique. CONTRAST:  OMNIPAQUE IOHEXOL 300 MG/ML  SOLN COMPARISON:  10/13/2020 FINDINGS: Lower chest: Stable tiny perifissural nodule right lower lobe consistent with subpleural lymph node. Hepatobiliary: No suspicious focal abnormality within the liver parenchyma. There is no evidence for gallstones, gallbladder wall thickening, or pericholecystic fluid. No intrahepatic or extrahepatic biliary dilation. Pancreas: No focal mass lesion. No dilatation of the main duct. No intraparenchymal cyst. No peripancreatic edema. Spleen: No splenomegaly. No focal mass lesion. Adrenals/Urinary Tract: No adrenal nodule or mass. Kidneys unremarkable. No evidence for hydroureter. The urinary bladder appears normal for the degree of distention. Stomach/Bowel: Stomach is decompressed. Duodenum is normally positioned as is the ligament of Treitz. No small bowel wall thickening. No small bowel dilatation. The terminal  ileum is normal. The appendix is not well visualized, but there is no edema or inflammation in the region of the cecum. No gross colonic mass. No colonic wall thickening. Vascular/Lymphatic: No abdominal aortic aneurysm. There is no gastrohepatic or hepatoduodenal ligament lymphadenopathy. No retroperitoneal or mesenteric lymphadenopathy. No pelvic sidewall lymphadenopathy. Reproductive: The prostate gland and seminal vesicles are unremarkable. Other: No intraperitoneal free fluid. Musculoskeletal: No worrisome lytic or sclerotic osseous abnormality. IMPRESSION: No acute findings in the abdomen or pelvis. Specifically, no findings to explain the patient's history of abdominal pain. Electronically Signed   By: Kennith Center M.D.   On: 10/31/2021 06:08    ED Course / MDM   Clinical Course as of 11/24/21 1531  Mon Nov 24, 2021  1511 GI saw, plan to admit for further WU, trend LFTs, possible needs ID ref [BH]    Clinical Course User Index [BH] Maddi Collar A, PA-C   Medical Decision Making Amount and/or Complexity of Data Reviewed External Data Reviewed: labs, radiology, ECG and notes. Labs: ordered. Decision-making details documented in ED Course. Radiology: ordered and independent interpretation performed. Decision-making details documented in ED Course. ECG/medicine tests: ordered and independent interpretation performed. Decision-making details documented in ED Course.  Risk OTC drugs.  Prescription drug management. Parenteral controlled substances. Decision regarding hospitalization. Diagnosis or treatment significantly limited by social determinants of health.   Personally reviewed and interpreted labs and imaging  Currently elevated LFTs and prior admission for suicide time by drinking bleach, had normalized prior to discharge  [facility] has assessed patient at bedside.  They recommend admission for trending LFTs, further work-up of transaminitis.  Plan to consult with medicine  for admission for trending LFTs, further work-up elevated LFT.  CONSULT with FM teaching service who agrees to evaluate patient for admission  The patient appears reasonably stabilized for admission considering the current resources, flow, and capabilities available in the ED at this time, and I doubt any other [facility] requiring further screening and/or treatment in the ED prior to admission.        Mariam Helbert A, PA-C 11/25/21 0002    Ernie Avena, MD 11/25/21 [phone]

## 2021-11-24 NOTE — H&P (Addendum)
Hospital Admission History and Physical Service Pager: [phone]  Patient name: Cody Poole Medical record number: [id number] Date of Birth: [birth year] Age: 26 y.o. Gender: male  Primary Care Provider: [facility] Consultants: GI Code Status: FULL  Preferred Emergency Contact: Sister Cody Poole Information     Name Relation Home Work Mobile   Poole,Cody Sister [phone]     Poole,Cody Mother   [phone]   Poole,Cody Significant other   [phone]        Chief Complaint: Weakness, suicidal ideation  Assessment and Plan: Cody Poole is a 26 y.o. male with history of IV drug use and past suicide attempt p/w weakness and active suicidal ideation, found to have significantly elevated LFTs. Under IVC for SI. Recent admission 7mo ago for SA with bleach ingestion, was also found to have elevated LFTs and positive for HepC. 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 GI on board, will evaluate for other sources. Anticipating withdrawal from opioids given heavy use. Will hold home meds for now given that pt has not been taking them as prescribed recently and is unsure about dosages.  * Transaminitis AST 916, ALT 1238 on admission. Not acutely encephalopathic. During previous admission had similarly elevated LFTs and was found to be positive for Hepatitis C. Platelets wnl, albumin 3.5, Tbili 1.5. Tylenol level wnl. Abdominal U/S unremarkable. -Admit to med-tele, attending Dr. Jennette Kettle -GI following, appreciate recs --f/u hepatic function panel --f/u HCV and HBV labs --f/u labs for autoimmune and metabolic sources of liver disease --f/u PT/INR --continue to trend LFTs --ID outpatient f/u for HepC -AM CBC, CMP  Polysubstance abuse (HCC) Reports frequent use of suboxone, heroin, tobacco. Heroin most recently 2 days ago. Reports last suboxone 1 week ago. Multiple recent ED visits for heroin overdose. Bleach ingestion 88mo ago,  current SI plan includes drinking bleach. Anticipating withdrawal. Denies EtOH use. UDS +amphetamines, +THC. Receives suboxone w/ Dr. Mervyn Skeeters at [facility]. -COWS -CIWA -Nicotine patch -Clonidine taper -PRN bentyl, imodium -Reach out to [place] in AM, consider restarting suboxone -PT/OT  Suicidal ideation Currently under involuntary commitment due to active SI with plan. Plan involves drinking bleach. Admitted 39mo ago due to suicide attempt with bleach. Treating acute medical issues as above. -Psych consulted -Sitter at bedside  Housing insecurity Pt reports homelessness.  -TOC consult  Paranoid schizophrenia (HCC) Has not been taking home meds as prescribed. -Psych consulted      FEN/GI: Reg diet VTE Prophylaxis: Lovenox  Disposition: Pending psych eval, TOC, and PT/OT  History of Present Illness:  Cody Poole is a 26 y.o. male presenting with weakness and suicidal ideation, requesting to be placed in a mental health facility.   Pt reports that his most recent suicide attempt was about 1 month ago, when he drank bleach and was admitted to the hospital. He left AMA from that admission and he did not follow up with his PCP or a psychiatrist afterwards. However, has had multiple ED visits recently for heroin overdose. Reports regular use of suboxone (receives from Dr. Mervyn Skeeters at [facility]), heroin, and tobacco 2 packs/day. Denies alcohol. Most recent reported heroin use about 2 days ago. Reports last suboxone about 1 week ago.  Currently, endorses active SI with plan to drink bleach. Wishes to get better and is requesting help. Endorses generalized weakness, dizziness, diffuse mild abdominal pain, and some SOB. Some diarrhea recently but no fevers or blood in stool. Denies N/V currently.  In  the ED, started on clonidine taper.Given Atarax x1. Labs showing transaminitis with AST 916 and ALT 1238. Other labs and imaging as below.  Of note, during previous admission 7/4-7/13  had similarly elevated LFTs and was found to be positive for hepatitis C. Left AMA from that admission and lost to follow up aside from multiple ED visits as above.   Pertinent Past Medical History: Paranoid schizophrenia Depression GAD  Hepatitis C Remainder reviewed in history tab.   Pertinent Past Surgical History: Reviewed in history tab.   Pertinent Social History: Tobacco use: Yes 2ppd Alcohol use: denies Other Substance use: heroin, suboxone, THC Homeless  Pertinent Family History: Reviewed in history tab.   Important Outpatient Medications: Reports he has not been taking home meds as prescribed. Documented: Suboxone, Buspar, Klonopin, Neurontin, Zyprexa, Zoloft  Objective: BP 117/77 (BP Location: Left Arm)   Pulse 77   Temp 98 F (36.7 C) (Oral)   Resp 18   Ht 6' (1.829 m)   Wt 72.6 kg   SpO2 100%   BMI 21.71 kg/m  Exam: General: Awake, alert, responsive, laying in bed Eyes: PERRL, EOMI, pupils 33mm Neck: Normal ROM Cardiovascular: RRR Respiratory: CTAB, normal WOB Gastrointestinal: soft, moderate diffuse tenderness without r/g MSK: moves all extremities spontaneously Neuro: No gross deficits  Psych: Active SI with plan, depressed mood  Labs:  CBC BMET  Recent Labs  Lab 11/24/21 0322  WBC 8.6  HGB 13.6  HCT 41.6  PLT 350   Recent Labs  Lab 11/24/21 0322  NA 138  K 4.0  CL 105  CO2 26  BUN 11  CREATININE 0.72  GLUCOSE 108*  CALCIUM 9.2     CMP     Component Value Date/Time   NA 138 11/24/2021 0322   K 4.0 11/24/2021 0322   CL 105 11/24/2021 0322   CO2 26 11/24/2021 0322   GLUCOSE 108 (H) 11/24/2021 0322   BUN 11 11/24/2021 0322   CREATININE 0.72 11/24/2021 0322   CALCIUM 9.2 11/24/2021 0322   PROT 6.5 11/24/2021 0322   ALBUMIN 3.1 (L) 11/24/2021 0322   AST 916 (H) 11/24/2021 0322   ALT 1,238 (H) 11/24/2021 0322   ALKPHOS 137 (H) 11/24/2021 0322   BILITOT 1.5 (H) 11/24/2021 0322   GFRNONAA >60 11/24/2021 0322   GFRAA >60  04/21/2016 0846     Pertinent additional labs   UA unremarkable Lipase wnl Ethanol wnl Acetaminophen, salicylate wnl UDS +amphetamines, THC   EKG: NSR    Imaging Studies Performed:  US ABDOMEN LIMITED Radiologist impression:  Small volume perihepatic free fluid. Otherwise unremarkable right upper quadrant ultrasound.    Vonna Drafts, MD 11/24/2021, 6:47 PM PGY-1, [facility]  FPTS Intern pager: [phone], text pages welcome Secure chat group [facility] Teaching Service    Upper Level Addendum:  I have seen and evaluated this patient along with Dr. Barb Merino and reviewed the above note, making necessary revisions as appropriate.  I agree with the medical decision making and physical exam as noted above.  Alfredo Martinez, MD PGY-2 [facility] Family Medicine Residency

## 2021-11-24 NOTE — Assessment & Plan Note (Addendum)
Reports frequent use of suboxone, heroin, tobacco. Heroin most recently 2 days ago. Reports last suboxone 1 week ago. Multiple recent ED visits for heroin overdose. Bleach ingestion 87mo ago, current SI plan includes drinking bleach. Anticipating withdrawal. Denies EtOH use. UDS +amphetamines, +THC. Receives suboxone w/ Dr. Mervyn Skeeters at [facility]. -COWS -CIWA -Nicotine patch -Clonidine taper -PRN bentyl, imodium -Reach out to [place] in AM, consider restarting suboxone -PT/OT

## 2021-11-24 NOTE — ED Notes (Signed)
CALLED PATIENT X3 NO ANSWER.

## 2021-11-24 NOTE — Progress Notes (Signed)
FMTS Brief Progress Note  S: I evaluated patient at bedside with Dr. Wynelle Link.  He reports no significant change in his symptoms from time of admission.  He was requesting his Suboxone, and we discussed that we will check his records to see if that was an appropriate decision at this time.  Patient reported no other concerns.  O: BP 119/70 (BP Location: Right Arm)   Pulse 65   Temp 98 F (36.7 C) (Oral)   Resp 16   Ht 6' (1.829 m)   Wt 72.6 kg   SpO2 100%   BMI 21.71 kg/m   General: Resting in bed, not in acute distress, tired Cardio: Regular rate and rhythm Respiratory: CTAB Psych: Depressed affect  A/P: We plan to hold Suboxone until we receive records from his MAT clinic, and due to concern of Suboxone use with hepatic impairment.  - Orders reviewed. Labs for AM ordered, which was adjusted as needed.  - If condition changes, please page night team.   Tiffany Kocher, MD 11/24/2021, 9:17 PM PGY-1, [facility] Family Medicine Night Resident  Please page [phone] with questions.

## 2021-11-24 NOTE — Hospital Course (Addendum)
GI saw and gave recs

## 2021-11-24 NOTE — Consult Note (Signed)
[facility] Consult: 12:07 PM 11/24/2021  LOS: 0 days    Referring Provider: Dr Gara Kroner MD  Primary Care Physician:  P[facility], [place] [facility]ologist:  unassigned     Reason for Consultation: Elevated LFTs.   HPI: Cody Poole is a 26 y.o. male.  Pt is troubled.  Hx paranoid schizophrenia, depression, anxiety.  Poly substance abuse, includes IV Heroin.  Opioid abuse, on Suboxone.  Suicide attempts. Hep C positive as far back as 04/2021 when quant :HCV not detected", however viral quant 537,000 early July 2023.      7/4-7/14 admission.  Left AMA.  Admission related to his drinking bleach.  Patient said he drank the bleach to "clear out his insides", denies suicidal attempt.  Though he denied use of illicit drugs and alcohol his tox urine was positive for amphetamine, opiates, THC.  Experienced mild epigastric pain.  Treated briefly with NAC.  Patient was deemed not a risk to himself or others and cleared by psychiatry for discharge but ended up leaving AMA.  HCV Ab positive.  Elevated LFTs were on improving trend T. bili 1.3..  0.6, alk phos 228..  43, AST/ALT 1005/2052..  126/122.  RUQ ultrasound was unremarkable, Dopplers normal.  Back in the ED on 7/20 after being found unresponsive and treated with Narcan.  Remained stable during the ED stay visit and asked to leave, thus was discharged. Back to ED again on 7/20 with abdominal pain.  Said the pain had been present since the admission in July.  Had been noncompliant with many meds since he had not followed up with his behavioral health providers for new prescriptions.  Was taking Suboxone and Zoloft. 10/31/2021 CTAP showed normal-looking liver, pancreas, spleen, stomach and bowel.  ED visit on 7/26, again unresponsive, apneic, treated  with Narcan.  Admitted to shooting up with heroin that evening. Yet another heroin overdose with Narcan treatment and ED visit on 7/29 Suspected drug overdose and another ED visit 8/9.  He was requesting medical detox.  Ended up discharged home.  Presented back to the ED yesterday.  Had gone to a local fire station complaining of presyncope.  Felt like he was having some withdrawal symptoms with diarrhea, nausea, pain in the mid abdomen, muscle aches.  He had used heroin earlier that day.  Treated, labs reviewed, released.  Received dicyclomine, Zofran, naproxen. Back in the ED this morning with suicidal ideation.  Had plan to drink bleach.  Continued IV drug abuse.  Their provider notes say he denies use of alcohol but for me he says he drinks a 40 ounce of beer daily.  Mid abdominal discomfort.  No nausea or vomiting.  Appetite is good.  He gets hungry but because he is homeless and lacks funds, he has not been eating regularly.  T. bili 1.5.  Alk phos 137.  AST/ALT 916/1238.  WBCs 8.6.  Hb 13.6.  Platelets 350. RUQ [place]: Small perihepatic free fluid, otherwise unremarkable study.  CBD 3 mm.  Normal liver parenchyma.  PV Dopplers normal.`  Listed home meds  include Klonopin, Neurontin, Zyprexa, Zoloft.  However the last time these were filled with a 30-day supply were in late April 2023.  Does say he is taking his Suboxone but its been a while since he saw the provider who prescribes this.  In addition to IV heroin use, last use was yesterday, he also drinks a 40 ounce beer daily, last consumption yesterday. Unstable housing.  Currently homeless  Past Medical History:  Diagnosis Date   Anxiety    Back pain    Depression    Hypertension    Paranoid schizophrenia ([place])     History reviewed. No pertinent surgical history.  Prior to Admission medications   Medication Sig Start Date End Date Taking? Authorizing Provider  buprenorphine-naloxone (SUBOXONE) 8-2 mg SUBL SL tablet Place 1 tablet  under the tongue in the morning and at bedtime.    [provider]  BUSPIRONE HCL PO Take 1 tablet by mouth daily.    [provider]  clonazePAM (KLONOPIN) 1 MG tablet Take 1 mg by mouth daily.    [provider]  gabapentin (NEURONTIN) 300 MG capsule Take 300 mg by mouth daily.    [provider]  OLANZapine (ZYPREXA) 10 MG tablet Take 10 mg by mouth daily.    [provider]  sertraline (ZOLOFT) 50 MG tablet Take 50 mg by mouth daily.    [provider]    Scheduled Meds:  ibuprofen  600 mg Oral Once   Infusions:  PRN Meds: hydrOXYzine   Allergies as of 11/24/2021 - Review Complete 11/24/2021  Allergen Reaction Noted   Haldol [haloperidol] Swelling and Other (See Comments) 05/12/2021    Family History  Family history unknown: Yes    Social History   Socioeconomic History   Marital status: Single    Spouse name: Not on file   Number of children: Not on file   Years of education: Not on file   Highest education level: Not on file  Occupational History   Not on file  Tobacco Use   Smoking status: Every Day   Smokeless tobacco: Never  Vaping Use   Vaping Use: Never used  Substance and Sexual Activity   Alcohol use: No   Drug use: Yes    Types: IV, Cocaine    Comment: Last use 05/09/20   Sexual activity: Not on file    Comment: heroin, crack cocaine  Other Topics Concern   Not on file  Social History Narrative   Not on file   Social Determinants of Health   Financial Resource Strain: Not on file  Food Insecurity: Not on file  Transportation Needs: Not on file  Physical Activity: Not on file  Stress: Not on file  Social Connections: Not on file  Intimate Partner Violence: Not on file    REVIEW OF SYSTEMS: Constitutional: Some fatigue, not profound.  Just feels poorly. ENT:  No nose bleeds Pulm: No shortness of breath.  No cough. CV:  No palpitations, no LE edema.  GU:  No hematuria, no  frequency GI: See HPI. Heme: [place] unusual or excessive bleeding or bruising. Transfusions: None. Neuro:  No headaches, no peripheral tingling or numbness.  No syncope.  No seizures. Derm:  No itching, no rash or sores.  Endocrine:  No sweats or chills.  No polyuria or dysuria Immunization: Not queried. Travel: Not queried.   PHYSICAL EXAM: Vital signs in last 24 hours: Vitals:   11/24/21 0911 11/24/21 1100  BP: 110/76 105/70  Pulse:  82 74  Resp: 16 18  Temp: 98 F (36.7 C) 98.1 F (36.7 C)  SpO2: 100% 97%   Wt Readings from Last 3 Encounters:  11/24/21 72.6 kg  11/20/21 72.6 kg  11/19/21 73 kg    General: Patient looks moderately ill.  Not toxic.  Not jaundiced. Head: No facial asymmetry or swelling.  There is well-healed scar on his forehead. Eyes: No scleral icterus.  No conjunctival pallor. Ears: Not hard of hearing Nose: No congestion or discharge Mouth: Good dentition.  Tongue midline.  Mucosa moist, pink, clear. Neck: No JVD, no thyromegaly, no masses Lungs: No labored breathing or cough.  Lungs clear bilaterally Heart: RRR.  No MRG.  S1, S2 present. Abdomen: Soft.  Although he says it is tender he did not react to the exam so it seems to be minimal tenderness.  Bowel sounds active.  No distention.  No organomegaly or hernias appreciated.   Rectal: Deferred. Musc/Skeltl: No joint redness, swelling or gross deformity. Extremities: No CCE. Neurologic: Oriented x3.  No tremors.  No obvious weakness, moves all 4 limbs. Skin: Needle track marks on both forearms. Tattoos: Extensive tattoos on his trunk and arms. Nodes: No cervical adenopathy Psych: Cooperative, affect subdued.  [place]  Intake/Output from previous day: No intake/output data recorded. Intake/Output this shift: No intake/output data recorded.  LAB RESULTS: Recent Labs    11/23/21 1349 11/24/21 0322  WBC 8.8 8.6  HGB 14.5 13.6  HCT 44.1 41.6  PLT 386 350   BMET Lab Results  Component  Value Date   NA 138 11/24/2021   NA 138 11/23/2021   NA 137 11/19/2021   K 4.0 11/24/2021   K 4.5 11/23/2021   K 3.8 11/19/2021   CL 105 11/24/2021   CL 101 11/23/2021   CL 102 11/19/2021   CO2 26 11/24/2021   CO2 27 11/23/2021   CO2 29 11/19/2021   GLUCOSE 108 (H) 11/24/2021   GLUCOSE 114 (H) 11/23/2021   GLUCOSE 121 (H) 11/19/2021   BUN 11 11/24/2021   BUN 9 11/23/2021   BUN 16 11/19/2021   CREATININE 0.72 11/24/2021   CREATININE 0.74 11/23/2021   CREATININE 0.79 11/19/2021   CALCIUM 9.2 11/24/2021   CALCIUM 9.7 11/23/2021   CALCIUM 8.9 11/19/2021   LFT Recent Labs    11/24/21 0322  PROT 6.5  ALBUMIN 3.1*  AST 916*  ALT 1,238*  ALKPHOS 137*  BILITOT 1.5*   PT/INR Lab Results  Component Value Date   INR 1.1 10/20/2021   INR 1.1 10/15/2021   INR 1.1 10/13/2020   Hepatitis Panel No results for input(s): "HEPBSAG", "HCVAB", "HEPAIGM", "HEPBIGM" in the last 72 hours. C-Diff No components found for: "CDIFF" Lipase     Component Value Date/Time   LIPASE 28 11/24/2021 0748    Drugs of Abuse     Component Value Date/Time   LABOPIA NONE DETECTED 11/24/2021 0346   COCAINSCRNUR NONE DETECTED 11/24/2021 0346   LABBENZ NONE DETECTED 11/24/2021 0346   AMPHETMU POSITIVE (A) 11/24/2021 0346   THCU POSITIVE (A) 11/24/2021 0346   LABBARB NONE DETECTED 11/24/2021 0346     RADIOLOGY STUDIES: US Abdomen Limited  Result Date: 11/24/2021 CLINICAL DATA:  Elevated LFTs. EXAM: ULTRASOUND ABDOMEN LIMITED RIGHT UPPER QUADRANT COMPARISON:  CT scan 10/31/2021 FINDINGS: Gallbladder: No gallstones or wall thickening visualized. No sonographic Murphy sign noted by sonographer. Common bile duct: Diameter: 3 mm Liver: No focal lesion identified. Within normal limits in parenchymal echogenicity. Portal vein is patent  on color Doppler imaging with normal direction of blood flow towards the liver. Other: Small volume free fluid noted adjacent to the liver. IMPRESSION: Small volume  perihepatic free fluid. Otherwise unremarkable right upper quadrant ultrasound. Electronically Signed   By: Misty Stanley M.D.   On: 11/24/2021 07:47        IMPRESSION:     Acutely elevated LFTs.  Levels similar to those of early/mid July. HCV positive.  Now with elevated HCV quant, compared with undetectable virus earlier this year. Platelets WNL.  INR normal a month ago.  Abdominal pain, acute/recurrent.  Suspect this may be due to opioid withdrawal.  Ultrasounds x2 and CT x1 within the last 6 weeks unremarkable.  Mental health issues of schizophrenia, depression/anxiety.  Suicidal ideation.  Drank bleach  Opioid dependency.  Using injected heroin.  Previously on Suboxone but not recently.    Homeless.    Schizophrenia, depression/anxiety.  No psych consult yet   PLAN:     Will check various labs to rule out autoimmune and metabolic sources of liver diseases.      Referral to ID for treatment of HCV, if deemed candidate for this.  Challenge is his mental health disease, substance abuse and homelessness.    Ordered reg diet.      Azucena Freed  11/24/2021, 12:07 PM Phone [phone]

## 2021-11-24 NOTE — Assessment & Plan Note (Addendum)
AST 916, ALT 1238 on admission, slowly trending down. Not acutely encephalopathic. During previous admission had similarly elevated LFTs and was found to be positive for Hepatitis C. Hep C reactive during current admission. Platelets wnl. Tylenol level wnl. Abdominal U/S unremarkable. -GI signed off, no further workup -avoid hepatotoxic drugs --ID outpatient f/u for HepC -AM CMP

## 2021-11-24 NOTE — Assessment & Plan Note (Signed)
Currently under involuntary commitment due to active SI with plan. Plan involves drinking bleach. Admitted 36mo ago due to suicide attempt with bleach. Treating acute medical issues as above. -Psych consulted -Sitter at bedside

## 2021-11-24 NOTE — Assessment & Plan Note (Signed)
Pt reports homelessness.  -TOC consult

## 2021-11-24 NOTE — ED Notes (Signed)
IVC paperwork is in blue zone 

## 2021-11-24 NOTE — ED Provider Triage Note (Addendum)
Emergency Medicine Provider Triage Evaluation Note  Cody Poole , a 26 y.o. male  was evaluated in triage.  Pt complains of SI.  Seen yesterday for polysubstance abuse.  States SI with plan to drink bleach, but denies ingesting any.  Also reports HI against "everyone" and vague AVH.  Denies drug use since discharge yesterday evening.  Denies physical complaints.  Review of Systems  Positive: SI, HI, AVH Negative: fever  Physical Exam  BP (!) 109/95   Pulse (!) 124   Temp 97.8 F (36.6 C)   Resp 16   Ht 6' (1.829 m)   Wt 72.6 kg   SpO2 100%   BMI 21.71 kg/m  Gen:   Awake, no distress   Resp:  Normal effort  MSK:   Moves extremities without difficulty  Other:  Sleeping in triage, awoken for exam  Medical Decision Making  Medically screening exam initiated at 4:47 AM.  Appropriate orders placed.  Chawn Spraggins was informed that the remainder of the evaluation will be completed by another provider, this initial triage assessment does not replace that evaluation, and the importance of remaining in the ED until their evaluation is complete.  SI/HI/AVH.  4:48 AM Labs with significant transaminitis-- AST/ALT 916, 1238 respectively.  Alk ohos 137.  Bili 1.5.  hx of similar in the past requiring hospitalization-- hep C + and felt to be related to drinking bleach.  He denies drinking bleach this AM.  Negative APAP and salicylate levels.  Cannot medically clear at this time.   Larene Pickett, PA-C 11/24/21 0450    Larene Pickett, PA-C 11/24/21 0451    Larene Pickett, PA-C 11/24/21 [phone]

## 2021-11-24 NOTE — ED Notes (Signed)
Pt irritated, wants to go, asking to sign AMA paperwork and go home. Explained to pt we are waiting on GI clearance, pt states he wants to leave. Provider notified

## 2021-11-24 NOTE — ED Triage Notes (Signed)
PT C/O SI/HI. Wanting to harm self today by drinking bleach. And wants to harm anyone. Pt did not make any attempts to harm self or anyone else. Pt asleep during triage.

## 2021-11-24 NOTE — ED Notes (Signed)
Pt amenable to admission but asks for lights to turn off, be moved to different room. Pt has taken off all clothes but boxers and will not put scrubs back on. Explained to pt the plan and why he was waiting in the hallway for a bed upstairs. Sitter at bedside

## 2021-11-24 NOTE — ED Notes (Signed)
IVC paperwork completed, placed a copy in medical records, red folder, and three on the clip board. IVC expires on 12/01/21

## 2021-11-25 ENCOUNTER — Observation Stay (HOSPITAL_COMMUNITY): Payer: No Typology Code available for payment source

## 2021-11-25 DIAGNOSIS — R079 Chest pain, unspecified: Secondary | ICD-10-CM

## 2021-11-25 DIAGNOSIS — Z59819 Housing instability, housed unspecified: Secondary | ICD-10-CM

## 2021-11-25 DIAGNOSIS — R45851 Suicidal ideations: Secondary | ICD-10-CM | POA: Diagnosis not present

## 2021-11-25 DIAGNOSIS — R9431 Abnormal electrocardiogram [ECG] [EKG]: Secondary | ICD-10-CM | POA: Diagnosis not present

## 2021-11-25 DIAGNOSIS — F200 Paranoid schizophrenia: Secondary | ICD-10-CM | POA: Diagnosis not present

## 2021-11-25 DIAGNOSIS — R7401 Elevation of levels of liver transaminase levels: Secondary | ICD-10-CM | POA: Diagnosis not present

## 2021-11-25 DIAGNOSIS — B1920 Unspecified viral hepatitis C without hepatic coma: Secondary | ICD-10-CM | POA: Insufficient documentation

## 2021-11-25 LAB — CERULOPLASMIN: Ceruloplasmin: 34.8 mg/dL — ABNORMAL HIGH (ref 16.0–31.0)

## 2021-11-25 LAB — COMPREHENSIVE METABOLIC PANEL
ALT: 1152 U/L — ABNORMAL HIGH (ref 0–44)
AST: 700 U/L — ABNORMAL HIGH (ref 15–41)
Albumin: 3.1 g/dL — ABNORMAL LOW (ref 3.5–5.0)
Alkaline Phosphatase: 138 U/L — ABNORMAL HIGH (ref 38–126)
Anion gap: 5 (ref 5–15)
BUN: 13 mg/dL (ref 6–20)
CO2: 27 mmol/L (ref 22–32)
Calcium: 9.2 mg/dL (ref 8.9–10.3)
Chloride: 107 mmol/L (ref 98–111)
Creatinine, Ser: 0.73 mg/dL (ref 0.61–1.24)
GFR, Estimated: >60 mL/min (ref >60)
Glucose, Bld: 107 mg/dL — ABNORMAL HIGH (ref 70–99)
Potassium: 4.1 mmol/L (ref 3.5–5.1)
Sodium: 139 mmol/L (ref 135–145)
Total Bilirubin: 0.9 mg/dL (ref 0.3–1.2)
Total Protein: 6.4 g/dL — ABNORMAL LOW (ref 6.5–8.1)

## 2021-11-25 LAB — HCV RNA QUANT
HCV Quantitative Log: 5.644 log10 IU/mL (ref >1.70)
HCV Quantitative: 441000 IU/mL (ref >50)

## 2021-11-25 LAB — CBC
HCT: 38.9 % — ABNORMAL LOW (ref 39.0–52.0)
Hemoglobin: 12.9 g/dL — ABNORMAL LOW (ref 13.0–17.0)
MCH: 27.7 pg (ref 26.0–34.0)
MCHC: 33.2 g/dL (ref 30.0–36.0)
MCV: 83.7 fL (ref 80.0–100.0)
Platelets: 353 10*3/uL (ref 150–400)
RBC: 4.65 MIL/uL (ref 4.22–5.81)
RDW: 14.9 % (ref 11.5–15.5)
WBC: 7.4 10*3/uL (ref 4.0–10.5)
nRBC: 0 % (ref 0.0–0.2)

## 2021-11-25 LAB — HEPATITIS B DNA, ULTRAQUANTITATIVE, PCR
HBV DNA SERPL PCR-ACNC: NOT DETECTED IU/mL
HBV DNA SERPL PCR-LOG IU: UNDETERMINED log10 IU/mL

## 2021-11-25 LAB — ANA W/REFLEX IF POSITIVE: Anti Nuclear Antibody (ANA): NEGATIVE

## 2021-11-25 LAB — ALPHA-1-ANTITRYPSIN: A-1 Antitrypsin, Ser: 176 mg/dL — ABNORMAL HIGH (ref 95–164)

## 2021-11-25 LAB — IGG: IgG (Immunoglobin G), Serum: 1367 mg/dL (ref 603–1613)

## 2021-11-25 LAB — TROPONIN I (HIGH SENSITIVITY)
Troponin I (High Sensitivity): 3 ng/L
Troponin I (High Sensitivity): 3 ng/L

## 2021-11-25 MED ORDER — LORAZEPAM 1 MG PO TABS
1.0000 mg | ORAL_TABLET | Freq: Once | ORAL | Status: AC
  Administered 2021-11-25: 1 mg via ORAL
  Filled 2021-11-25: qty 1

## 2021-11-25 MED ORDER — ASPIRIN 325 MG PO TABS: 325.0000 mg | ORAL_TABLET | Freq: Every day | ORAL | Status: DC

## 2021-11-25 MED ORDER — OLANZAPINE 5 MG PO TBDP
2.5000 mg | ORAL_TABLET | Freq: Two times a day (BID) | ORAL | Status: DC
  Administered 2021-11-25 – 2021-11-26 (×3): 2.5 mg via ORAL
  Filled 2021-11-25 (×3): qty 1

## 2021-11-25 MED ORDER — HYDROXYZINE HCL 25 MG PO TABS
50.0000 mg | ORAL_TABLET | Freq: Three times a day (TID) | ORAL | Status: DC | PRN
  Administered 2021-11-25 (×2): 50 mg via ORAL
  Filled 2021-11-25 (×2): qty 2

## 2021-11-25 MED ORDER — LORAZEPAM 1 MG PO TABS
1.0000 mg | ORAL_TABLET | Freq: Four times a day (QID) | ORAL | Status: DC | PRN
  Administered 2021-11-25: 1 mg via ORAL
  Filled 2021-11-25: qty 1

## 2021-11-25 MED ORDER — ASPIRIN 325 MG PO TABS
325.0000 mg | ORAL_TABLET | Freq: Every day | ORAL | Status: DC
  Administered 2021-11-25: 325 mg via ORAL
  Filled 2021-11-25 (×2): qty 1

## 2021-11-25 NOTE — ED Notes (Signed)
Patient's personal belongings locked in purple locker #5

## 2021-11-25 NOTE — ED Notes (Signed)
Patient has been moved to [facility]; IVC paperwork remains in [facility] Zone.  Expiration date is 12/01/2021.

## 2021-11-25 NOTE — Progress Notes (Signed)
Daily Rounding Note  11/25/2021, 10:05 AM  LOS: 0 days   SUBJECTIVE:   Chief complaint:    HCV.  Elevated LFTs  Still has diffuse abdominal pain.  Stools are loose but not as frequently as before.  No nausea.  Tolerating solid diet.  Sitter in the room.  Patient apparently under involuntary commitment status.  OBJECTIVE:         Vital signs in last 24 hours:    Temp:  [97.9 F (36.6 C)-99.1 F (37.3 C)] 99.1 F (37.3 C) (08/15 0957) Pulse Rate:  [63-82] 82 (08/15 0830) Resp:  [16-21] 21 (08/15 0830) BP: (102-152)/(64-89) 152/89 (08/15 0830) SpO2:  [97 %-100 %] 99 % (08/15 0830)   Filed Weights   11/24/21 0321  Weight: 72.6 kg   General: NAD.  Resting comfortably.  Looks somewhat ill Heart: RRR Chest: No labored breathing or cough.  Lungs clear bilaterally Abdomen: Soft.  Diffusely mildly tender without guarding or rebound.  Bowel sounds active.  No distention. Extremities: No CCE.  Needle track marks on both forearms. Neuro/Psych: Alert.  Oriented x3.  Cooperative.  Calm.  Intake/Output from previous day: 08/14 0701 - 08/15 0700 In: 2660 [P.O.:2660] Out: 6 [Urine:6]  Intake/Output this shift: Total I/O In: 240 [P.O.:240] Out: -   Lab Results: Recent Labs    11/23/21 1349 11/24/21 0322 11/25/21 0331  WBC 8.8 8.6 7.4  HGB 14.5 13.6 12.9*  HCT 44.1 41.6 38.9*  PLT 386 350 353   BMET Recent Labs    11/23/21 1349 11/24/21 0322 11/25/21 0331  NA 138 138 139  K 4.5 4.0 4.1  CL 101 105 107  CO2 27 26 27   GLUCOSE 114* 108* 107*  BUN 9 11 13   CREATININE 0.74 0.72 0.73  CALCIUM 9.7 9.2 9.2   LFT Recent Labs    11/24/21 0322 11/24/21 1840 11/25/21 0331  PROT 6.5 6.6 6.4*  ALBUMIN 3.1* 3.1* 3.1*  AST 916* 805* 700*  ALT 1,238* 1,217* 1,152*  ALKPHOS 137* 133* 138*  BILITOT 1.5* 1.1 0.9  BILIDIR  --  0.5*  --   IBILI  --  0.6  --    PT/INR Recent Labs    11/24/21 1840  LABPROT 14.3   INR 1.1   Hepatitis Panel Recent Labs    11/24/21 1840  HEPBSAG NON REACTIVE  HCVAB Reactive*  HEPAIGM NON REACTIVE  HEPBIGM NON REACTIVE    Studies/Results: DG CHEST PORT 1 VIEW  Result Date: 11/25/2021 CLINICAL DATA:  A 26 year old male presents with mid chest pain and elevated LFTs. EXAM: PORTABLE CHEST 1 VIEW COMPARISON:  November 19, 2021 FINDINGS: EKG leads project over the chest. Trachea midline. Cardiomediastinal contours and hilar structures are normal. Lungs are clear. On limited assessment no acute skeletal findings. IMPRESSION: No acute cardiopulmonary disease. Electronically Signed   By: 22 M.D.   On: 11/25/2021 08:10   Donzetta Kohut Abdomen Limited  Result Date: 11/24/2021 CLINICAL DATA:  Elevated LFTs. EXAM: ULTRASOUND ABDOMEN LIMITED RIGHT UPPER QUADRANT COMPARISON:  CT scan 10/31/2021 FINDINGS: Gallbladder: No gallstones or wall thickening visualized. No sonographic Murphy sign noted by sonographer. Common bile duct: Diameter: 3 mm Liver: No focal lesion identified. Within normal limits in parenchymal echogenicity. Portal vein is patent on color Doppler imaging with normal direction of blood flow towards the liver. Other: Small volume free fluid noted adjacent to the liver. IMPRESSION: Small volume perihepatic free fluid. Otherwise unremarkable right upper quadrant ultrasound.  Electronically Signed   By: Kennith Center M.D.   On: 11/24/2021 07:47    ASSESMENT:     IV heroin use/dependence. Opiate use disorder on Suboxone.  Many recent overdoses treated w Narcan.  Withdrawal symptoms associated with nausea, vomiting, abdominal pain, loose stools.    Acute, recurrent elevation LFTs w small perihepatic FF but no biliary, ductal, hepatic parenchymal abnormalities on ultrasound.  HCV positive, viral quant of 537 a month ago.  INR normal.  ANA normal. IgG normal.  Ceruloplasmin 34.8 (normal range 16-31), was normal a month ago.  Alpha-1 antitrypsin 176 (normal range is 95-164) also  elevated at 172 a month ago.  Mitochondrial and smooth muscle Abs are pending. HCV and HBV quant studies pending.  Admits to drinking 40 ounces beer daily.  LFTs are trending down.  There is possibly an element of shock liver/hypoperfusion especially given his recent overdoses of heroin he may have had some low pressures.  Significant mental health and social problems.  Homeless.  Anxiety/depression/schizophrenia.  Suicidal ideation and past attempts.    ST elevation on EKG.  Chest pain and palpitation.  Low concern for ACS.  Troponins pending.  Resident spoke with cardiology but do not see cardiology consult yet.   PLAN     Await final pndg tests.  Needs ID referral for consideration of initiating treatment for his hep C.  Needs complete abstinent from drugs and alcohol.      CMET in AM.      Cody Poole  11/25/2021, 10:05 AM Phone [phone]

## 2021-11-25 NOTE — Consult Note (Addendum)
Cardiology Consultation:   Patient ID: Cody Poole MRN: [id number]; DOB: [birth year]  Admit date: 11/24/2021 Date of Consult: 11/25/2021  PCP:  Pc, [place] Providers Cardiologist:  None       Patient Profile:   Cody Poole is a 26 y.o. male with a hx of IV drug use, history of suicide, attempts, hepatitis C who is being seen 11/25/2021 for the evaluation of chest pain, EKG changes at the request of Dr. Nori Riis.  History of Present Illness:   Cody Poole is a 26 year old male with above medical history who does not follow with cardiology. Of note, patient has been seen in the ED several times in the past few months for accidental overdoses, polysubstance abuse, suicidal ideation, opioid use with withdrawal.   Patient presented to the ED on 8/14 statin that he wanted to harm himself by drinking bleach, also wanted to harm anyone. Labs in the ED showed AST 916, ALT 1238. Albumin 3.1. UDS was positive for amphetamines, tetrahydrocannabinol. (Note, UDS from 7/20, 7/26, 8/9 also positive for cocaine).   Patient was admitted for evaluation and treatment of transaminitis, suicidal ideation. GI and psych were consulted, patient IVC'ed until 12/01/21.   While admitted, teaching service was notified that patient has ST changes on telemetry. Patient reported having chest pain and dyspnea that have been ongoing since he was admitted. hsTn 3>>3. EKG showed sinus rhythm, ST elevation in leads II, III, aVF, V2-V4 and tall QRS complexes in V4, V5-- suspicious for LVH.   On interview, patient reports that he has been having intermittent chest tightness for the past several days. Pain feels like tightness/pressure, located in the middle of his chest. Worse with deep inhalation, reproducible on palpation. Denies cough, SOB, palpitations. Pain is not associated with rest/exertion or position. Improved with pain medications.   Patient also complains of right upper quadrant  abdominal pain. Also worse with palpation.    Past Medical History:  Diagnosis Date   Anxiety    Back pain    Depression    Hypertension    Paranoid schizophrenia ([place])     History reviewed. No pertinent surgical history.   Home Medications:  Prior to Admission medications   Medication Sig Start Date End Date Taking? Authorizing Provider  buprenorphine-naloxone (SUBOXONE) 8-2 mg SUBL SL tablet Place 1 tablet under the tongue in the morning and at bedtime.   Yes [provider]  BUSPIRONE HCL PO Take 1 tablet by mouth daily.   Yes [provider]  clonazePAM (KLONOPIN) 1 MG tablet Take 1 mg by mouth daily.   Yes [provider]  gabapentin (NEURONTIN) 300 MG capsule Take 300 mg by mouth daily.   Yes [provider]  OLANZapine (ZYPREXA) 10 MG tablet Take 10 mg by mouth daily.   Yes [provider]  sertraline (ZOLOFT) 50 MG tablet Take 50 mg by mouth daily.   Yes [provider]    Inpatient Medications: Scheduled Meds:  aspirin  325 mg Oral Daily   cloNIDine  0.1 mg Oral QID   Followed by   Derrill Memo ON 11/26/2021] cloNIDine  0.1 mg Oral BID   Followed by   Derrill Memo ON 11/29/2021] cloNIDine  0.1 mg Oral Daily   enoxaparin (LOVENOX) injection  40 mg Subcutaneous Q24H   nicotine  21 mg Transdermal Daily   OLANZapine zydis  2.5 mg Oral BID   Continuous Infusions:  PRN Meds: dicyclomine, hydrOXYzine, loperamide, LORazepam, methocarbamol, naproxen, ondansetron  Allergies:    Allergies  Allergen Reactions   Haldol [Haloperidol] Swelling and Other (See Comments)    Tongue swelling  Dysarthria  Relieved with benadryl     Social History:   Social History   Socioeconomic History   Marital status: Single    Spouse name: Not on file   Number of children: Not on file   Years of education: Not on file   Highest education level: Not on file  Occupational History   Not on file  Tobacco Use   Smoking status: Every Day    Smokeless tobacco: Never  Vaping Use   Vaping Use: Never used  Substance and Sexual Activity   Alcohol use: No   Drug use: Yes    Types: IV, Cocaine    Comment: Last use 05/09/20   Sexual activity: Not on file    Comment: heroin, crack cocaine  Other Topics Concern   Not on file  Social History Narrative   Not on file   Social Determinants of Health   Financial Resource Strain: Not on file  Food Insecurity: Not on file  Transportation Needs: Not on file  Physical Activity: Not on file  Stress: Not on file  Social Connections: Not on file  Intimate Partner Violence: Not on file    Family History:    Family History  Family history unknown: Yes     ROS:  Please see the history of present illness.   All other ROS reviewed and negative.     Physical Exam/Data:   Vitals:   11/25/21 1215 11/25/21 1230 11/25/21 1245 11/25/21 1249  BP: 124/79  (!) 144/77 (!) 99/58  Pulse:  66 62   Resp: 20 (!) 23 18 (!) 24  Temp:      TempSrc:      SpO2:  99% 99%   Weight:      Height:        Intake/Output Summary (Last 24 hours) at 11/25/2021 1451 Last data filed at 11/25/2021 1200 Gross per 24 hour  Intake 1320 ml  Output 1506 ml  Net -186 ml      11/24/2021    3:21 AM 11/20/2021    3:06 AM 11/19/2021    7:01 PM  Last 3 Weights  Weight (lbs) 160 lb 0.9 oz 160 lb 160 lb 15 oz  Weight (kg) 72.6 kg 72.576 kg 73 kg     Body mass index is 21.71 kg/m.  General: Pleasant young adult male, laying flat in the bed  HEENT: normal Neck: no JVD Vascular: Radial pulses 2+ bilaterally Cardiac:  normal S1, S2; RRR; no murmur. Chest wall tender to palpation.  Lungs:  clear to auscultation bilaterally, no wheezing, rhonchi or rales  Abd: soft, tender to palpation in RUQ  Ext: no edema Musculoskeletal:  No deformities, BUE and BLE strength normal and equal Skin: warm and dry  Neuro:  CNs 2-12 intact, no focal abnormalities noted Psych:  Normal affect   EKG:  The EKG was personally  reviewed and demonstrates:  sinus rhythm, ST elevation in leads II, III, aVF, V2-V4 and tall QRS complexes in V4, V5-- suspicious for LVH. Telemetry:  Telemetry was personally reviewed and demonstrates:  Sinus rhythm  Relevant CV Studies:   Laboratory Data:  High Sensitivity Troponin:   Recent Labs  Lab 11/25/21 0830 11/25/21 1016  TROPONINIHS 3 3     Chemistry Recent Labs  Lab 11/23/21 1349 11/24/21 0322 11/25/21 0331  NA 138 138 139  K  4.5 4.0 4.1  CL 101 105 107  CO2 27 26 27   GLUCOSE 114* 108* 107*  BUN 9 11 13   CREATININE 0.74 0.72 0.73  CALCIUM 9.7 9.2 9.2  GFRNONAA >60 >60 >60  ANIONGAP 10 7 5     Recent Labs  Lab 11/24/21 0322 11/24/21 1840 11/25/21 0331  PROT 6.5 6.6 6.4*  ALBUMIN 3.1* 3.1* 3.1*  AST 916* 805* 700*  ALT 1,238* 1,217* 1,152*  ALKPHOS 137* 133* 138*  BILITOT 1.5* 1.1 0.9   Lipids No results for input(s): "CHOL", "TRIG", "HDL", "LABVLDL", "LDLCALC", "CHOLHDL" in the last 168 hours.  Hematology Recent Labs  Lab 11/23/21 1349 11/24/21 0322 11/25/21 0331  WBC 8.8 8.6 7.4  RBC 5.19 4.86 4.65  HGB 14.5 13.6 12.9*  HCT 44.1 41.6 38.9*  MCV 85.0 85.6 83.7  MCH 27.9 28.0 27.7  MCHC 32.9 32.7 33.2  RDW 14.6 14.7 14.9  PLT 386 350 353   Thyroid No results for input(s): "TSH", "FREET4" in the last 168 hours.  BNPNo results for input(s): "BNP", "PROBNP" in the last 168 hours.  DDimer No results for input(s): "DDIMER" in the last 168 hours.   Radiology/Studies:  DG CHEST PORT 1 VIEW  Result Date: 11/25/2021 CLINICAL DATA:  A 26 year old male presents with mid chest pain and elevated LFTs. EXAM: PORTABLE CHEST 1 VIEW COMPARISON:  November 19, 2021 FINDINGS: EKG leads project over the chest. Trachea midline. Cardiomediastinal contours and hilar structures are normal. Lungs are clear. On limited assessment no acute skeletal findings. IMPRESSION: No acute cardiopulmonary disease. Electronically Signed   By: Zetta Bills M.D.   On: 11/25/2021  08:10   US Abdomen Limited  Result Date: 11/24/2021 CLINICAL DATA:  Elevated LFTs. EXAM: ULTRASOUND ABDOMEN LIMITED RIGHT UPPER QUADRANT COMPARISON:  CT scan 10/31/2021 FINDINGS: Gallbladder: No gallstones or wall thickening visualized. No sonographic Murphy sign noted by sonographer. Common bile duct: Diameter: 3 mm Liver: No focal lesion identified. Within normal limits in parenchymal echogenicity. Portal vein is patent on color Doppler imaging with normal direction of blood flow towards the liver. Other: Small volume free fluid noted adjacent to the liver. IMPRESSION: Small volume perihepatic free fluid. Otherwise unremarkable right upper quadrant ultrasound. Electronically Signed   By: Misty Stanley M.D.   On: 11/24/2021 07:47     Assessment and Plan:   Chest Pain  - Patient complains of intermittent chest pressure/tightness. Not associated with position, rest/exertion. Pain is rose with deep inhalation and reproducible on palpation  - hsTn negative 3>>3 - EKG shows sinus rhythm, ST elevation in leads II, III, aVF, V2-V4 and tall QRS complexes in V4, V5-- suspicious for LVH. - UDS from 8/9 was positive for cocaine, suspect contributing to chest pain  - Patient is very young, trops negative, chest pain worse with deep inhalation and reproducible to palpation. These factors are reassuring this is not CAD - Possible pain is musculoskeletal or related to cocaine use  - Obtain echocardiogram given EKG changes.   Otherwise per primary  - Transaminitis  - Polysubstance abuse  - Suicidal ideation  - Anxiety - Paranoid Schizophrenia  - Homelessness    Risk Assessment/Risk Scores:   HEAR Score (for undifferentiated chest pain):  Pathway disabled           For questions or updates, please contact [place] Please consult [email or web address] for contact info under    Signed, Margie Billet, PA-C  11/25/2021 2:51 PM  Patient seen and examined.  Agree with above documentation.  Cody Poole is a 26 year old male with a history of IVDA, multiple suicide attempts, HCV who we are consulted by Dr. Jennette Kettle for evaluation of chest pain and EKG changes.  He presented to ED with suicidal ideation on 814.  Initial vital signs notable for BP 109/95, pulse 124, SPO2 100% on room air.  Labs notable for significant transaminitis (AST 916, ALT 1238), creatinine 0.7, WBC 8.6, hemoglobin 13.6, Troponin 3 > 3.  EKG shows sinus rhythm, rate 65, diffuse concave ST elevations, LVH.  EKG appears similar to priors.  He reports his chest pain is dull in center and to right side of chest.  Worse with deep inspiration or palpation.  No relationship to exertion and pain is not positional.  On exam, patient is alert and oriented, regular rate and rhythm, no murmurs, lungs CTAB, no LE edema or JVD.  For his chest pain, given age and lack of risk factors, do not suspect CAD.  Pain is most likely musculoskeletal, as worsened with palpation.  He has diffuse concave ST elevations suggesting early repolarization versus acute pericarditis, but ST elevations were present on prior EKGs, suggesting likely early repolarization.  Does have LVH, will check echocardiogram.  If unremarkable, no further cardiac work-up recommended.  Little Ishikawa, MD

## 2021-11-25 NOTE — Consult Note (Signed)
[facility] Face-to-Face Psychiatry Consult   Reason for Consult: IVC, history of paranoid schizophrenia, not on medication.  Substance use disorder, previously treated with Suboxone. Referring Physician: Dr. Jennette Kettle Patient Identification: Cody Poole MRN:  [id number] Principal Diagnosis: Transaminitis Diagnosis:  Principal Problem:   Transaminitis Active Problems:   Paranoid schizophrenia (HCC)   Polysubstance abuse (HCC)   Anxiety   Suicidal ideation   Housing insecurity   Chest pain   Total Time spent with patient: 45 minutes  Subjective:   Cody Poole is a 26 y.o. male patient admitted with abdominal pain, shortness of breath.  He initially endorsed active suicidal ideations with a plan to drink bleach upon admission to the ED.  Patient was placed under involuntary commitment for the same.  Pt Reports symptoms of acute psychosis to include auditory, visual, command hallucinations, delusions, psychosis, and paranoia.  He further endorses command hallucinations telling him to harm himself and others.  He denies any delusions that would be associated to self-harm.  Patient denies any recent suicide attempt, it is documented of a recent suicide attempt with bleach ingestion although patient denies and continues to deny any history of suicide attempt.  Patient admits to drinking bleach to clean out insides, at that time like insight into harmful effects of bleach.  He does not denies compliance with his psychotropic medication.  He endorses daily use of heroin, and is interested in seeking inpatient rehab and resuming Suboxone therapy.  He does not admit to intermittent hallucinations that are chronic in nature, secondary to his schizophrenia.  He verbalizes that substance use tends to make his voices louder, however he continues to use.  At the time of this psychiatric evaluation he does deny suicide thoughts, suicidal ideations, and any thoughts to self harm.  He does acknowledge telling previous  providers that he was suicidal however declines at this time.  When asked the patient what has changed " I just do not feel suicidal anymore.  I want help.  I want to start my Suboxone.  I was just having the thoughts, I was not going to harm myself.  I do not want to kill myself anymore.  Patient denies any access to weapons, guns, and or knives.  Patient is seen and assessed, chart reviewed, case discussed with Dr. Lucianne Muss.  On evaluation patient reports initially seeking treatment for shortness of breath and abdominal pain, secondary to drinking bleach.  He denies any auditory or visual hallucinations that prompted this admission.  Per chart review, patient has documented history of paranoid schizophrenia as well as polysubstance abuse, with outpatient medication regimen that includes olanzapine, gabapentin, BuSpar, Zoloft. Patient states he is homeless and has not talked to his mother. COnsent is obtained to speak with her and get collateral information.  Patient states he does feel safe while in the hospital, and upon discharge.  Patient reports med compliance with his psychotropic medication to include olanzapine, gabapentin, and Suboxone.  Patient is also receiving Suboxone services at [facility].  At this time patient denies any intentional suicidal ideations, suicidal thoughts, and or suicide attempts.  He is able to contract for safety while on the unit.    During evaluation Cody Poole is sitting on side of bed in no acute distress.  He is alert, oriented x 4, calm and cooperative.  His mood is anxious with congruent affect.  He does not appear to be responding to internal/external stimuli or delusional thoughts.  He reports poor sleep secondary to nightmares.  He states his appetite is good.  He denies suicidal/self-harm/homicidal ideation, psychosis, and paranoia.  Patient answered question appropriately.Discussed rehabilitation possibilities.   HPI:  Cody Poole is a 26 y.o. male  with history of IV drug use and past suicide attempt p/w weakness and active suicidal ideation, found to have significantly elevated LFTs. Under IVC for SI. Recent admission 66mo ago for SA with bleach ingestion, was also found to have elevated LFTs and positive for HepC. 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 GI on board, will evaluate for other sources. Anticipating withdrawal from opioids given heavy use. Will hold home meds for now given that pt has not been taking them as prescribed recently and is unsure about dosages.    HPI:  : Cody Poole is a 26 y.o. male with medical history significant for paranoid schizophrenia, polysubstance abuse, GAD, who is admitted to [facility] on 10/14/2021 with acute bleach ingestion after presenting from home to [facility] ED complaining of abdominal pain.   Heroin daily ("1/2 gram for the past 3-4 years..unless I have suboxone") He endorses daily use of nicotine and alcohol. He has history of marijuana use. Denies ingestion of any additional household items over the last day, nor any recent recreational drug use.  Denies any concomitant alcohol consumption.     Past Psychiatric History: Past psych diagnoses: Reported schizophrenia, [name], PTSD Suicide attempts: Denied Psychiatric med trials: Haldol 10 mg twice daily.  Patient reported history of LAI, Zyprexa Current outpatient psychiatrist: Dr. Mervyn Skeeters at [facility] Current outpatient therapist: Denied History of selective adherence: Yes.      Family Psychiatric History: Completed/attempted suicide: Denied Bipolar spectrum disorder: Denied Schizophrenia spectrum disorder: Father Substance abuse: Denied   Substance Abuse History: Alcohol: Rarely, occasional Nicotine: 1 PPD daily since 26 years old Illicit drugs: IVDU-meth, cocaine Rx drug abuse: Denied Seizure hx: Denied Others: Benprenorphine-naloxone (2021)   Social History:  Income: Social Security Housing:  Homeless  Risk to Self:   Risk to Others:   Prior Inpatient Therapy:   Prior Outpatient Therapy:    Past Medical History:  Past Medical History:  Diagnosis Date   Anxiety    Back pain    Depression    Hypertension    Paranoid schizophrenia (HCC)    History reviewed. No pertinent surgical history. Family History:  Family History  Family history unknown: Yes   Family Psychiatric  History: See above Social History:  Social History   Substance and Sexual Activity  Alcohol Use No     Social History   Substance and Sexual Activity  Drug Use Yes   Types: IV, Cocaine   Comment: Last use 05/09/20    Social History   Socioeconomic History   Marital status: Single    Spouse name: Not on file   Number of children: Not on file   Years of education: Not on file   Highest education level: Not on file  Occupational History   Not on file  Tobacco Use   Smoking status: Every Day   Smokeless tobacco: Never  Vaping Use   Vaping Use: Never used  Substance and Sexual Activity   Alcohol use: No   Drug use: Yes    Types: IV, Cocaine    Comment: Last use 05/09/20   Sexual activity: Not on file    Comment: heroin, crack cocaine  Other Topics Concern   Not on file  Social History Narrative   Not on file   Social  Determinants of Health   Financial Resource Strain: Not on file  Food Insecurity: Not on file  Transportation Needs: Not on file  Physical Activity: Not on file  Stress: Not on file  Social Connections: Not on file   Additional Social History:    Allergies:   Allergies  Allergen Reactions   Haldol [Haloperidol] Swelling and Other (See Comments)    Tongue swelling  Dysarthria  Relieved with benadryl     Labs:  Results for orders placed or performed during the hospital encounter of 11/24/21 (from the past 48 hour(s))  CBC with Differential     Status: None   Collection Time: 11/24/21  3:22 AM  Result Value Ref Range   WBC 8.6 4.0 - 10.5 K/uL   RBC 4.86  4.22 - 5.81 MIL/uL   Hemoglobin 13.6 13.0 - 17.0 g/dL   HCT 41.6 39.0 - 52.0 %   MCV 85.6 80.0 - 100.0 fL   MCH 28.0 26.0 - 34.0 pg   MCHC 32.7 30.0 - 36.0 g/dL   RDW 14.7 11.5 - 15.5 %   Platelets 350 150 - 400 K/uL   nRBC 0.0 0.0 - 0.2 %   Neutrophils Relative % 66 %   Neutro Abs 5.8 1.7 - 7.7 K/uL   Lymphocytes Relative 24 %   Lymphs Abs 2.1 0.7 - 4.0 K/uL   Monocytes Relative 8 %   Monocytes Absolute 0.7 0.1 - 1.0 K/uL   Eosinophils Relative 1 %   Eosinophils Absolute 0.1 0.0 - 0.5 K/uL   Basophils Relative 1 %   Basophils Absolute 0.1 0.0 - 0.1 K/uL   Immature Granulocytes 0 %   Abs Immature Granulocytes 0.02 0.00 - 0.07 K/uL    Comment: Performed at [facility], [address], [place], [place] [address]  Comprehensive metabolic panel     Status: Abnormal   Collection Time: 11/24/21  3:22 AM  Result Value Ref Range   Sodium 138 135 - 145 mmol/L   Potassium 4.0 3.5 - 5.1 mmol/L   Chloride 105 98 - 111 mmol/L   CO2 26 22 - 32 mmol/L   Glucose, Bld 108 (H) 70 - 99 mg/dL    Comment: Glucose reference range applies only to samples taken after fasting for at least 8 hours.   BUN 11 6 - 20 mg/dL   Creatinine, Ser 0.72 0.61 - 1.24 mg/dL   Calcium 9.2 8.9 - 10.3 mg/dL   Total Protein 6.5 6.5 - 8.1 g/dL   Albumin 3.1 (L) 3.5 - 5.0 g/dL   AST 916 (H) 15 - 41 U/L   ALT 1,238 (H) 0 - 44 U/L   Alkaline Phosphatase 137 (H) 38 - 126 U/L   Total Bilirubin 1.5 (H) 0.3 - 1.2 mg/dL   GFR, Estimated >60 >60 mL/min    Comment: (NOTE) Calculated using the CKD-EPI Creatinine Equation (2021)    Anion gap 7 5 - 15    Comment: Performed at [facility], [place] [address] [place], [place] [address]  Ethanol     Status: None   Collection Time: 11/24/21  3:22 AM  Result Value Ref Range   Alcohol, Ethyl (B) <10 <10 mg/dL    Comment: (NOTE) Lowest detectable limit for serum alcohol is 10 mg/dL.  For medical purposes only. Performed at [facility], [place] [address],  [place],  [address]   Acetaminophen level     Status: Abnormal   Collection Time: 11/24/21  3:22  AM  Result Value Ref Range   Acetaminophen (Tylenol), Serum <10 (L) 10 - 30 ug/mL    Comment: (NOTE) Therapeutic concentrations vary significantly. A range of 10-30 ug/mL  may be an effective concentration for many patients. However, some  are best treated at concentrations outside of this range. Acetaminophen concentrations >150 ug/mL at 4 hours after ingestion  and >50 ug/mL at 12 hours after ingestion are often associated with  toxic reactions.  Performed at [facility], [place] [address], [place], [place] [id number]   Salicylate level     Status: Abnormal   Collection Time: 11/24/21  3:22 AM  Result Value Ref Range   Salicylate Lvl [id number] (L) 7.0 - 30.0 mg/dL    Comment: Performed at [facility] [address] [place], [place] [address]  Rapid urine drug screen (hospital performed)     Status: Abnormal   Collection Time: 11/24/21  3:46 AM  Result Value Ref Range   Opiates NONE DETECTED NONE DETECTED   Cocaine NONE DETECTED NONE DETECTED   Benzodiazepines NONE DETECTED NONE DETECTED   Amphetamines POSITIVE (A) NONE DETECTED   Tetrahydrocannabinol POSITIVE (A) NONE DETECTED   Barbiturates NONE DETECTED NONE DETECTED    Comment: (NOTE) DRUG SCREEN FOR MEDICAL PURPOSES ONLY.  IF CONFIRMATION IS NEEDED FOR ANY PURPOSE, NOTIFY LAB WITHIN 5 DAYS.  LOWEST DETECTABLE LIMITS FOR URINE DRUG SCREEN Drug Class                     Cutoff (ng/mL) Amphetamine and metabolites    1000 Barbiturate and metabolites    200 Benzodiazepine                 [id number] Tricyclics and metabolites     300 Opiates and metabolites        300 Cocaine and metabolites        300 THC                            50 Performed at [facility], [place] [address], [place], [place] [address]   Lipase, blood     Status: None   Collection Time: 11/24/21  7:48 AM  Result Value Ref Range   Lipase 28 11  - 51 U/L    Comment: Performed at [facility] [address], [place], [place] [address]  Resp Panel by RT-PCR (Flu A&B, Covid) Anterior Nasal Swab     Status: None   Collection Time: 11/24/21  2:45 PM   Specimen: Anterior Nasal Swab  Result Value Ref Range   SARS Coronavirus 2 by RT PCR NEGATIVE NEGATIVE    Comment: (NOTE) SARS-CoV-2 target nucleic acids are NOT DETECTED.  The SARS-CoV-2 RNA is generally detectable in upper respiratory specimens during the acute phase of infection. The lowest concentration of SARS-CoV-2 viral copies this assay can detect is 138 copies/mL. A negative result does not preclude SARS-Cov-2 infection and should not be used as the sole basis for treatment or other patient management decisions. A negative result may occur with  improper specimen collection/handling, submission of specimen other than nasopharyngeal swab, presence of viral mutation(s) within the areas targeted by this assay, and inadequate number of viral copies(<138 copies/mL). A negative result must be combined with clinical observations, patient history, and epidemiological information. The expected result is Negative.  Fact Sheet for Patients:  EntrepreneurPulse.com.au  Fact Sheet for Healthcare Providers:  IncredibleEmployment.be  This test is no t yet approved or cleared by the [place] and  has been authorized for detection and/or diagnosis of SARS-CoV-2 by FDA under an Emergency Use Authorization (EUA). This EUA will remain  in effect (meaning this test can be used) for the duration of the COVID-19 declaration under Section 564(b)(1) of the Act, 21 U.S.C.section 360bbb-3(b)(1), unless the authorization is terminated  or revoked sooner.       Influenza A by PCR NEGATIVE NEGATIVE   Influenza B by PCR NEGATIVE NEGATIVE    Comment: (NOTE) The Xpert Xpress SARS-CoV-2/FLU/RSV plus assay is intended as an aid in the diagnosis of  influenza from Nasopharyngeal swab specimens and should not be used as a sole basis for treatment. Nasal washings and aspirates are unacceptable for Xpert Xpress SARS-CoV-2/FLU/RSV testing.  Fact Sheet for Patients: EntrepreneurPulse.com.au  Fact Sheet for Healthcare Providers: IncredibleEmployment.be  This test is not yet approved or cleared by the [place] FDA and has been authorized for detection and/or diagnosis of SARS-CoV-2 by FDA under an Emergency Use Authorization (EUA). This EUA will remain in effect (meaning this test can be used) for the duration of the COVID-19 declaration under Section 564(b)(1) of the Act, 21 U.S.C. section 360bbb-3(b)(1), unless the authorization is terminated or revoked.  Performed at [facility], [place] [address], [place], [place] [address]   Hepatitis panel, acute     Status: Abnormal   Collection Time: 11/24/21  6:40 PM  Result Value Ref Range   Hepatitis B Surface Ag NON REACTIVE NON REACTIVE   HCV Ab Reactive (A) NON REACTIVE    Comment: (NOTE) The CDC recommends that a Reactive HCV antibody result be followed up  with a HCV Nucleic Acid Amplification test.     Hep A IgM NON REACTIVE NON REACTIVE   Hep B C IgM NON REACTIVE NON REACTIVE    Comment: Performed at [facility], [place] [address], [place], [place] [address]  ANA w/Reflex if Positive     Status: None   Collection Time: 11/24/21  6:40 PM  Result Value Ref Range   Anti Nuclear Antibody (ANA) Negative Negative    Comment: (NOTE) Performed At: [facility] [place], [place] [id number] Rush Farmer MD [id number]   IgG     Status: None   Collection Time: 11/24/21  6:40 PM  Result Value Ref Range   IgG (Immunoglobin G), Serum 1,367 603 - 1,613 mg/dL    Comment: (NOTE) Performed At: [facility] [place], [place] [id number] Rush Farmer MD [id number]   Ceruloplasmin      Status: Abnormal   Collection Time: 11/24/21  6:40 PM  Result Value Ref Range   Ceruloplasmin 34.8 (H) 16.0 - 31.0 mg/dL    Comment: (NOTE) Performed At: [facility] [place], [place] [id number] Rush Farmer MD [id number]   Alpha-1-antitrypsin     Status: Abnormal   Collection Time: 11/24/21  6:40 PM  Result Value Ref Range   A-1 Antitrypsin, Ser 176 (H) 95 - 164 mg/dL    Comment: (NOTE) Performed At: [facility] [place], [place] [id number] Rush Farmer MD [id number]   Protime-INR     Status: None   Collection Time: 11/24/21  6:40 PM  Result Value Ref Range   Prothrombin Time 14.3 11.4 - 15.2 seconds   INR 1.1 0.8 - 1.2    Comment: (NOTE) INR goal varies based on device and disease  states. Performed at [facility], [place] [address], [place], [place] [address]   Hepatic function panel     Status: Abnormal   Collection Time: 11/24/21  6:40 PM  Result Value Ref Range   Total Protein 6.6 6.5 - 8.1 g/dL   Albumin 3.1 (L) 3.5 - 5.0 g/dL   AST 805 (H) 15 - 41 U/L   ALT 1,217 (H) 0 - 44 U/L   Alkaline Phosphatase 133 (H) 38 - 126 U/L   Total Bilirubin 1.1 0.3 - 1.2 mg/dL   Bilirubin, Direct 0.5 (H) 0.0 - 0.2 mg/dL   Indirect Bilirubin 0.6 0.3 - 0.9 mg/dL    Comment: Performed at [facility] [address], [place], [place] [address]  Comprehensive metabolic panel     Status: Abnormal   Collection Time: 11/25/21  3:31 AM  Result Value Ref Range   Sodium 139 135 - 145 mmol/L   Potassium 4.1 3.5 - 5.1 mmol/L   Chloride 107 98 - 111 mmol/L   CO2 27 22 - 32 mmol/L   Glucose, Bld 107 (H) 70 - 99 mg/dL    Comment: Glucose reference range applies only to samples taken after fasting for at least 8 hours.   BUN 13 6 - 20 mg/dL   Creatinine, Ser 0.73 0.61 - 1.24 mg/dL   Calcium 9.2 8.9 - 10.3 mg/dL   Total Protein 6.4 (L) 6.5 - 8.1 g/dL   Albumin 3.1 (L) 3.5 - 5.0 g/dL   AST 700 (H) 15 - 41 U/L   ALT 1,152 (H) 0  - 44 U/L   Alkaline Phosphatase 138 (H) 38 - 126 U/L   Total Bilirubin 0.9 0.3 - 1.2 mg/dL   GFR, Estimated >60 >60 mL/min    Comment: (NOTE) Calculated using the CKD-EPI Creatinine Equation (2021)    Anion gap 5 5 - 15    Comment: Performed at [facility], [place] [address] [place] [address] [place], [place] [address]  CBC     Status: Abnormal   Collection Time: 11/25/21  3:31 AM  Result Value Ref Range   WBC 7.4 4.0 - 10.5 K/uL   RBC 4.65 4.22 - 5.81 MIL/uL   Hemoglobin 12.9 (L) 13.0 - 17.0 g/dL   HCT 38.9 (L) 39.0 - 52.0 %   MCV 83.7 80.0 - 100.0 fL   MCH 27.7 26.0 - 34.0 pg   MCHC 33.2 30.0 - 36.0 g/dL   RDW 14.9 11.5 - 15.5 %   Platelets 353 150 - 400 K/uL   nRBC 0.0 0.0 - 0.2 %    Comment: Performed at [facility], [place] [address], [place], [place] [address]  Troponin I (High Sensitivity)     Status: None   Collection Time: 11/25/21  8:30 AM  Result Value Ref Range   Troponin I (High Sensitivity) 3 <18 ng/L    Comment: (NOTE) Elevated high sensitivity troponin I (hsTnI) values and significant  changes across serial measurements may suggest ACS but many other  chronic and acute conditions are known to elevate hsTnI results.  Refer to the "Links" section for chest pain algorithms and additional  guidance. Performed at [facility], [place] [address] [place], [place] [address]     Current Facility-Administered Medications  Medication Dose Route Frequency Provider Last Rate Last Admin   aspirin tablet 325 mg  325 mg Oral Daily August Albino, MD   325 mg at 11/25/21 0831   cloNIDine (CATAPRES) tablet 0.1  mg  0.1 mg Oral QID Jeannie Fend, PA-C   0.1 mg at 11/25/21 2458   Followed by   Melene Muller ON 11/26/2021] cloNIDine (CATAPRES) tablet 0.1 mg  0.1 mg Oral BID Army Melia A, PA-C       Followed by   Melene Muller ON 11/29/2021] cloNIDine (CATAPRES) tablet 0.1 mg  0.1 mg Oral Daily Jeannie Fend, PA-C       dicyclomine (BENTYL) tablet 20 mg  20 mg Oral Q6H PRN Army Melia A,  PA-C       enoxaparin (LOVENOX) injection 40 mg  40 mg Subcutaneous Q24H Maxwell, Allee, MD       hydrOXYzine (ATARAX) tablet 50 mg  50 mg Oral TID PRN Sabino Dick, DO   50 mg at 11/25/21 0956   loperamide (IMODIUM) capsule 2-4 mg  2-4 mg Oral PRN Alfredo Martinez, MD   4 mg at 11/25/21 0956   LORazepam (ATIVAN) tablet 1 mg  1 mg Oral Q6H PRN Sabino Dick, DO       methocarbamol (ROBAXIN) tablet 500 mg  500 mg Oral Q8H PRN Army Melia A, PA-C   500 mg at 11/25/21 0510   naproxen (NAPROSYN) tablet 500 mg  500 mg Oral BID PRN Army Melia A, PA-C   500 mg at 11/25/21 0750   nicotine (NICODERM CQ - dosed in mg/24 hours) patch 21 mg  21 mg Transdermal Daily Vonna Drafts, MD   21 mg at 11/25/21 0955   OLANZapine zydis (ZYPREXA) disintegrating tablet 2.5 mg  2.5 mg Oral BID Maryagnes Amos, FNP       ondansetron (ZOFRAN-ODT) disintegrating tablet 4 mg  4 mg Oral Q6H PRN Jeannie Fend, PA-C   4 mg at 11/24/21 2003   Current Outpatient Medications  Medication Sig Dispense Refill   buprenorphine-naloxone (SUBOXONE) 8-2 mg SUBL SL tablet Place 1 tablet under the tongue in the morning and at bedtime.     BUSPIRONE HCL PO Take 1 tablet by mouth daily.     clonazePAM (KLONOPIN) 1 MG tablet Take 1 mg by mouth daily.     gabapentin (NEURONTIN) 300 MG capsule Take 300 mg by mouth daily.     OLANZapine (ZYPREXA) 10 MG tablet Take 10 mg by mouth daily.     sertraline (ZOLOFT) 50 MG tablet Take 50 mg by mouth daily.      Musculoskeletal: Strength & Muscle Tone: within normal limits Gait & Station: normal Patient leans: Right            Psychiatric Specialty Exam:  Presentation  General Appearance: Appropriate for Environment; Casual  Eye Contact:Minimal  Speech:Normal Rate; Clear and Coherent  Speech Volume:Decreased  Handedness:Right   Mood and Affect  Mood:Euthymic  Affect:Appropriate; Congruent   Thought Process  Thought  Processes:Coherent  Descriptions of Associations:Intact  Orientation:Full (Time, Place and Person)  Thought Content:Logical  History of Schizophrenia/Schizoaffective disorder:Yes  Duration of Psychotic Symptoms:Greater than six months  Hallucinations:No data recorded Ideas of Reference:None  Suicidal Thoughts:No data recorded Homicidal Thoughts:No data recorded  Sensorium  Memory:Immediate Fair; Recent Fair; Remote Poor  Judgment:Poor  Insight:Shallow   Executive Functions  Concentration:Fair  Attention Span:Fair  Recall:Fair  Fund of Knowledge:Fair  Language:Fair   Psychomotor Activity  Psychomotor Activity:No data recorded  Assets  Assets:Communication Skills; Desire for Improvement; Intimacy; Physical Health   Sleep  Sleep:No data recorded  Physical Exam: Physical Exam Constitutional:      Appearance: He is normal weight.  Skin:    Capillary  Refill: Capillary refill takes less than 2 seconds.  Neurological:     General: No focal deficit present.     Mental Status: He is alert and oriented to person, place, and time. Mental status is at baseline.  Psychiatric:        Mood and Affect: Mood normal.        Behavior: Behavior normal.        Thought Content: Thought content normal.        Judgment: Judgment normal.    Review of Systems  Psychiatric/Behavioral:  Positive for depression (thoughts of, denies at this time.), hallucinations (w/wo susbtances), substance abuse (heroin) and suicidal ideas. The patient is nervous/anxious.   All other systems reviewed and are negative.  Blood pressure (!) 152/89, pulse 82, temperature 99.1 F (37.3 C), temperature source Oral, resp. rate (!) 21, height 6' (1.829 m), weight 72.6 kg, SpO2 99 %. Body mass index is 21.71 kg/m.  Treatment Plan Summary: Plan continue psychiatric medication.  No changes or adjustments will be made at this time.  Patient appears to be at his baseline.  Psychiatry will sign off at  this time.    Restarted Zyprexa 2.5 mg p.o. twice daily to target symptoms of psychosis.  -Recommend COWS and clonidine detox protocol be initiated.  Will suspect acute withdrawal symptoms at any time.  -While patient may be a good candidate for Suboxone induction during his hospitalization, will need to ensure he is admitted to inpatient rehabilitation facility if started.  If not patient will continue to use Suboxone, and rely on heroin when his prescription runs out due to history of noncompliance.  -May rescind IVC, patient is no longer a danger to self. -Discontinue suicide sitter  -Recommend patient discharge from hospital to inpatient rehabilitation.  Will need TOC consult to initiate referral and inquire about bed availability.  Patient continues to relapse after discharge, and will need to be transported directly from facility/hospital to inpatient rehabilitation [facility] for a 21-day drug rehabilitation residential program.  Patient may also be a good candidate for [facility] rescue mission/[place].   Psych will continue to follow.  Disposition: No evidence of imminent risk to self or others at present.   Patient does not meet criteria for psychiatric inpatient admission. Supportive therapy provided about ongoing stressors. Refer to IOP. Discussed crisis plan, support from social network, calling 911, coming to the Emergency Department, and calling Suicide Hotline.  Suella Broad, FNP 11/25/2021 10:46 AM

## 2021-11-25 NOTE — Progress Notes (Addendum)
Daily Progress Note Intern Pager: [phone]  Patient name: Cody Poole Medical record number: [id number] Date of birth: 26-Sep-1995 Age: 26 y.o. Gender: male  Primary Care Provider: Pc, Cody Poole: GI, psych Code Status: FULL  Pt Overview and Major Events to Date:  8/14: admitted, IVC  Cody Poole:  Cody Poole is a 26 y.o. male with history of IV drug use and past suicide attempt presents with weakness and active suicidal ideation, found to have significantly elevated LFTs.  Currently under IVC, psych consulted.  GI following for transaminitis, suspect likely due to acute versus acute on chronic hep Poole. Recent heavy opioid use so anticipating withdrawal, ok to use suboxone per GI.  * Transaminitis AST 916, ALT 1238 on admission, slowly trending down. Not acutely encephalopathic. During previous admission had similarly elevated LFTs and was found to be positive for Cody Poole. Hep Poole reactive during current admission. Platelets wnl. Cody Poole level wnl. Abdominal U/S unremarkable. -GI following, appreciate recs --f/u labs for autoimmune and metabolic sources of liver disease --continue to trend LFTs --ID outpatient f/u for HepC -AM CMP  Polysubstance abuse (Cody Poole) Reports frequent use of suboxone, heroin, tobacco, meth. Heroin most recently 2 days PTA. Reports last suboxone 1 week PTA. Multiple recent ED visits for heroin overdose. Bleach ingestion 73moago, current SI Poole includes drinking bleach. Anticipating withdrawal. Denies EtOH use. UDS +amphetamines, +THC. Receives suboxone w/ Cody Poole -COWS -Suboxone for COWS >15 -CIWA -Nicotine patch -Clonidine taper for meth withdrawal -PRN bentyl, imodium -PT/OT  Chest pain Description of pain and recent methamphetamine use concerning for ACS but likely multifactorial in the setting of recent polysubstance use. Early ST changes on EKG. Given ASA 325, did not give nitroglycerin  due to involvement of inferior leads. No reported cocaine use. CXR unremarkable. Low suspicion for PNA, PTX, PE, dissection. Tenderness to palpation of chest wall is reassuring but not diagnostic. Initial troponin wnl. -f/u repeat troponin -Cardiology consulted, appreciate recs   Suicidal ideation Currently under involuntary commitment due to active SI with Poole on admission. Poole involves drinking bleach. Admitted 1775mogo due to suicide attempt with bleach. Treating acute medical issues as above. Currently denies active SI. -Psych consulted -Sitter at bedside  Housing insecurity Pt reports homelessness.  -TOC consult  Anxiety Pt endorsing anxiety, has attempted to leave IVC due to anxiety. -PRN hydroxyzine, Ativan  Paranoid schizophrenia (HCC) Vs substance induced psychosis. Has not been taking home meds as prescribed. -Psych consulted -Start Zyprexa 2.75m69mID       FEN/GI: reg diet PPx: lovenox Dispo: Residential rehab  pending clinical improvement . Barriers include psych eval, GI recs, withdrawal mgmt.   Subjective:  This morning, pt feeling continued diffuse pain and having complaints of nonradiating, crushing and stabbing, central chest pain, 5/10. Pain improves with rest. Some SOB and pleuritic pain. Admits to methamphetamine use 3-4 days ago, but denies cocaine use.  Denies active SI this morning.  Paged by RN due to ST elevations on monitor and EKG. See Cody Poole for further details.  Objective: Temp:  [97.9 F (36.6 Poole)-99.1 F (37.3 Poole)] 99.1 F (37.3 Poole) (08/15 0957) Pulse Rate:  [63-82] 82 (08/15 0830) Resp:  [16-21] 21 (08/15 0830) BP: (102-152)/(64-89) 152/89 (08/15 0830) SpO2:  [98 %-100 %] 99 % (08/15 0830) Physical Exam: General: Ambulated from bathroom, sitting up in bed Cardiovascular: Irregular rhythm, normal rate. +TTP of anterior chest wall Respiratory: CTAB, normal WOB Abdomen: soft, diffuse  moderate tenderness to palpation, unchanged  from prior Extremities: Moves all extremities Psych: Denies active SI/HI  Laboratory: Most recent CBC Lab Results  Component Value Date   WBC 7.4 11/25/2021   HGB 12.9 (L) 11/25/2021   HCT 38.9 (L) 11/25/2021   MCV 83.7 11/25/2021   PLT 353 11/25/2021   Most recent BMP    Latest Ref Rng & Units 11/25/2021    3:31 AM  BMP  Glucose 70 - 99 mg/dL 107   BUN 6 - 20 mg/dL 13   Creatinine 0.61 - 1.24 mg/dL 0.73   Sodium 135 - 145 mmol/L 139   Potassium 3.5 - 5.1 mmol/L 4.1   Chloride 98 - 111 mmol/L 107   CO2 22 - 32 mmol/L 27   Calcium 8.9 - 10.3 mg/dL 9.2       Latest Ref Rng & Units 11/25/2021    3:31 AM 11/24/2021    6:40 PM 11/24/2021    3:22 AM  Hepatic Function  Total Protein 6.5 - 8.1 g/dL 6.4  6.6  6.5   Albumin 3.5 - 5.0 g/dL 3.1  3.1  3.1   AST 15 - 41 U/L 700  805  916   ALT 0 - 44 U/L 1,152  1,217  1,238   Alk Phosphatase 38 - 126 U/L 138  133  137   Total Bilirubin 0.3 - 1.2 mg/dL 0.9  1.1  1.5   Bilirubin, Direct 0.0 - 0.2 mg/dL  0.5      Other pertinent labs   HCV Ab reactive HBV sAg non reactive PT/INR wnl  EKG:  My interpretation: ST elevations in inferior and lateral leads not present on prior EKG  Imaging  CXR Radiologist impression: No acute cardiopulmonary disease  My interpretation: Agree with radiologist impression  Cody Albino, MD 11/25/2021, 11:46 AM  PGY-1, [place] Intern pager: [phone], text pages welcome Secure chat group [facility] Teaching Service

## 2021-11-25 NOTE — Evaluation (Signed)
Occupational Therapy Evaluation Patient Details Name: Cody Poole MRN: [id number] DOB: [birth year] Today's Date: 11/25/2021   History of Present Illness 26 yo male presenting to ED with suicidal ideations with plan to drink bleach. Also reporting left-sided abdominal discomfort. Most recent EKG notable for ST elevations. PMH including  IV drug abuse, Schizophrenia, and hepatitis C.   Clinical Impression   Pt reporting he was homeless PTA (most recently living with his mother) and independent with ADLs. When pt asked about obtaining groceries, pt reporting he "bums it". Pt presenting with slight restlessness and anxiety, but able to engage in conversation and listen to RN's reasoning for need to stay and complete medical assessments. Pt currently performing at baseline for ADLs and functional mobility. Recommend no therapy follow ups at dc and will sign off.      Recommendations for follow up therapy are one component of a multi-disciplinary discharge planning process, led by the attending physician.  Recommendations may be updated based on patient status, additional functional criteria and insurance authorization.   Follow Up Recommendations  No OT follow up    Assistance Recommended at Discharge None  Patient can return home with the following      Functional Status Assessment  Patient has not had a recent decline in their functional status  Equipment Recommendations  None recommended by OT    Recommendations for Other Services       Precautions / Restrictions Precautions Precautions: Other (comment) (suicidal) Restrictions Weight Bearing Restrictions: No      Mobility Bed Mobility Overal bed mobility: Independent                  Transfers Overall transfer level: Independent                        Balance Overall balance assessment: Independent                                         ADL either performed or assessed with clinical  judgement   ADL Overall ADL's : Independent                                       General ADL Comments: Performing mobility in hallway, toileting, hand hygiene, and donning socks. No difficulties.     Vision         Perception     Praxis      Pertinent Vitals/Pain Pain Assessment Pain Assessment: 0-10 Pain Score: 8  Pain Location: Stomach/chest Pain Intervention(s): Monitored during session     Hand Dominance Right   Extremity/Trunk Assessment Upper Extremity Assessment Upper Extremity Assessment: Overall WFL for tasks assessed   Lower Extremity Assessment Lower Extremity Assessment: Overall WFL for tasks assessed       Communication Communication Communication: No difficulties   Cognition Arousal/Alertness: Awake/alert Behavior During Therapy: Restless, Anxious (Easily calmed) Overall Cognitive Status: No family/caregiver present to determine baseline cognitive functioning                                 General Comments: Pt with history of schizophrenia. Pt able to follow commands, answer questions, and maintain conversation. Slightly restless and anxious. Reports he was to leave AMA, but able  to reason with RN and know why he needs to stay.     General Comments  Sitter present    Exercises     Shoulder Instructions      Home Living Family/patient expects to be discharged to:: Shelter/Homeless                                        Prior Functioning/Environment Prior Level of Function : Independent/Modified Independent                        OT Problem List: Decreased activity tolerance;Impaired balance (sitting and/or standing);Decreased knowledge of precautions      OT Treatment/Interventions:      OT Goals(Current goals can be found in the care plan section) Acute Rehab OT Goals Patient Stated Goal: Get some Sprite OT Goal Formulation: All assessment and education complete, DC therapy   OT Frequency:      Co-evaluation              AM-PAC OT "6 Clicks" Daily Activity     Outcome Measure Help from another person eating meals?: None Help from another person taking care of personal grooming?: None Help from another person toileting, which includes using toliet, bedpan, or urinal?: None Help from another person bathing (including washing, rinsing, drying)?: None Help from another person to put on and taking off regular upper body clothing?: None Help from another person to put on and taking off regular lower body clothing?: None 6 Click Score: 24   End of Session Nurse Communication: Mobility status  Activity Tolerance: Patient tolerated treatment well Patient left: in bed;with call bell/phone within reach;with nursing/sitter in room  OT Visit Diagnosis: Muscle weakness (generalized) (M62.81)                Time: 0102-7253 OT Time Calculation (min): 8 min Charges:  OT General Charges $OT Visit: 1 Visit OT Evaluation $OT Eval Low Complexity: 1 Low  Gessica Jawad MSOT, OTR/L Acute Rehab Office: [phone]  Theodoro Grist Cherissa Hook 11/25/2021, 9:38 AM

## 2021-11-25 NOTE — ED Notes (Signed)
Paged admitting physician. Pt attached to telemetry with diffuse ST elevation and c/o upper abdominal pain. EKG obtained, shown to ED physician. No new orders. Waiting call back from primary team.

## 2021-11-25 NOTE — Assessment & Plan Note (Addendum)
Description of pain and recent methamphetamine use concerning for ACS vs pericarditis but likely multifactorial in the setting of recent polysubstance use. Early ST changes on EKG. Given ASA 325, did not give nitroglycerin due to involvement of inferior leads. No reported cocaine use. CXR unremarkable. Low suspicion for PNA, PTX, PE, dissection. Tenderness to palpation of chest wall is reassuring for MSK cause. Troponins normal. -Cardiology consulted, appreciate recs -Echocardiogram; if normal then no further workup

## 2021-11-25 NOTE — ED Notes (Signed)
Mother Leif Loflin [phone] would like an update immediately

## 2021-11-25 NOTE — Progress Notes (Signed)
FMTS Interim Progress Note  S:Notified that patient is having ST changes on telemetry. Went to bedside with Dr. Barb Merino to assess patient. He reports chest pain and dyspnea that have been ongoing since this admission.   O: BP 114/65   Pulse 64   Temp 98.1 F (36.7 C) (Oral)   Resp 20   Ht 6' (1.829 m)   Wt 72.6 kg   SpO2 100%   BMI 21.71 kg/m   General: Patient fidgety in bed, in no acute distress. CV: RRR, no murmurs or gallops auscultated Resp: CTAB, no wheezing, rales or rhonchi noted MSK: chest wall tenderness upon palpation Abdomen: soft, presence of bowel sounds, mild generalized tenderness Psych: mildly anxious, denies SI or HI  A/P: Patient is a 26 year old male with history of polysubstance use including IV use admitted for suicidal ideations reports worsening chest pain. Telemetry reviewed and noted to have brief period of irregular rhythm with normal rate. Vitals otherwise stable. Most recent EKG notable for ST elevations that were not present in prior EKGs. Awaiting troponin, CXR and repeat EKG. Reassuring that chest pain is elicited on palpation. Chest pain possibly multifactorial with substance use as major contributing factor. Of note, patient was positive for cocaine on UDS 6 days ago although he denies cocaine use. Given new ST changes, consulted cardiology to review this to determine if heparin would be appropriate to initiate at this time although still low concern for ACS. Ordered aspirin. Appreciate cardiology recommendations.   Reece Leader, DO 11/25/2021, 8:15 AM PGY-3, [facility] Family Medicine Service pager [phone]

## 2021-11-25 NOTE — ED Notes (Signed)
Notified Dr. Robyne Peers pt c/o CP and upper abdominal pain 9/10. EKG obtained. New orders received for Chest xray and serial trops. Will continue to monitor.

## 2021-11-25 NOTE — Assessment & Plan Note (Signed)
Pt endorsing anxiety, has attempted to leave IVC due to anxiety. -PRN hydroxyzine, Ativan

## 2021-11-25 NOTE — Progress Notes (Signed)
PT Cancellation Note  Patient Details Name: Cody Poole MRN: [id number] DOB: [birth year]   Cancelled Treatment:    Reason Eval/Treat Not Completed: Other (comment);PT screened, no needs identified, will sign off.  Pt is walking independently with nursing, but please reconsult PT if his mobility changes.   Ivar Drape 11/25/2021, 9:30 AM  Samul Dada, PT PhD Acute Rehab Dept. Number: [facility] [id number] and [facility] [phone]

## 2021-11-25 NOTE — ED Notes (Signed)
Received verbal report from Kuch Y RN at this time 

## 2021-11-26 ENCOUNTER — Other Ambulatory Visit (HOSPITAL_COMMUNITY): Payer: Self-pay

## 2021-11-26 ENCOUNTER — Observation Stay (HOSPITAL_BASED_OUTPATIENT_CLINIC_OR_DEPARTMENT_OTHER): Payer: No Typology Code available for payment source

## 2021-11-26 ENCOUNTER — Encounter (HOSPITAL_COMMUNITY): Payer: Self-pay | Admitting: Student

## 2021-11-26 DIAGNOSIS — F419 Anxiety disorder, unspecified: Secondary | ICD-10-CM

## 2021-11-26 DIAGNOSIS — R45851 Suicidal ideations: Secondary | ICD-10-CM | POA: Diagnosis not present

## 2021-11-26 DIAGNOSIS — R7401 Elevation of levels of liver transaminase levels: Secondary | ICD-10-CM

## 2021-11-26 DIAGNOSIS — R9431 Abnormal electrocardiogram [ECG] [EKG]: Secondary | ICD-10-CM

## 2021-11-26 DIAGNOSIS — R079 Chest pain, unspecified: Secondary | ICD-10-CM | POA: Diagnosis not present

## 2021-11-26 LAB — COMPREHENSIVE METABOLIC PANEL
ALT: 808 U/L — ABNORMAL HIGH (ref 0–44)
AST: 374 U/L — ABNORMAL HIGH (ref 15–41)
Albumin: 2.7 g/dL — ABNORMAL LOW (ref 3.5–5.0)
Alkaline Phosphatase: 106 U/L (ref 38–126)
Anion gap: 5 (ref 5–15)
BUN: 11 mg/dL (ref 6–20)
CO2: 23 mmol/L (ref 22–32)
Calcium: 7.9 mg/dL — ABNORMAL LOW (ref 8.9–10.3)
Chloride: 111 mmol/L (ref 98–111)
Creatinine, Ser: 0.67 mg/dL (ref 0.61–1.24)
GFR, Estimated: >60 mL/min (ref >60)
Glucose, Bld: 95 mg/dL (ref 70–99)
Potassium: 3.6 mmol/L (ref 3.5–5.1)
Sodium: 139 mmol/L (ref 135–145)
Total Bilirubin: 0.8 mg/dL (ref 0.3–1.2)
Total Protein: 5.5 g/dL — ABNORMAL LOW (ref 6.5–8.1)

## 2021-11-26 LAB — ECHOCARDIOGRAM COMPLETE
Area-P 1/2: 3.13 cm2
Height: 72 in
S' Lateral: 3.2 cm
Weight: 2560.86 oz

## 2021-11-26 LAB — ANTI-SMOOTH MUSCLE ANTIBODY, IGG: F-Actin IgG: 4 Units (ref 0–19)

## 2021-11-26 LAB — MITOCHONDRIAL ANTIBODIES: Mitochondrial M2 Ab, IgG: <20 Units (ref 0.0–20.0)

## 2021-11-26 MED ORDER — OLANZAPINE 5 MG PO TABS
2.5000 mg | ORAL_TABLET | Freq: Two times a day (BID) | ORAL | 0 refills | Status: DC
  Filled 2021-11-26: qty 30, 30d supply, fill #0

## 2021-11-26 MED ORDER — NICOTINE 21 MG/24HR TD PT24: 21.0000 mg | MEDICATED_PATCH | Freq: Every day | TRANSDERMAL | 0 refills | Status: DC

## 2021-11-26 NOTE — ED Notes (Signed)
let fiance back for to see pt advised them that this was a one time occurence. created a security code for people to find out information and it is cookie [employer] [phone].

## 2021-11-26 NOTE — ED Notes (Signed)
Pt c/o stomach hurting, asking for soda and coffee to drink. Advised to start with some water since his stomach is hurting.

## 2021-11-26 NOTE — Progress Notes (Signed)
  Echocardiogram 2D Echocardiogram has been performed.  Cody Poole 11/26/2021, 3:19 PM

## 2021-11-26 NOTE — Discharge Summary (Addendum)
Family Medicine Teaching [facility] Discharge Summary  Patient name: Cody Poole Medical record number: [id number] Date of birth: 17-Jun-1995 Age: 26 y.o. Gender: male Date of Admission: 11/24/2021  Date of Discharge: 11/26/2021 Admitting Physician: Alfredo Martinez, MD  Primary Care Provider: [facility] Consultants: GI, Psychiatry  Indication for Hospitalization: Active SI, abnormal LFTs, concern for opioid withdrawal  Brief Hospital Course:  Cody Poole is a 25 y.o.male with a history of IV drug use and hepatitis C who was admitted to the [facility] Teaching Service at [facility] for elevated LFTs and active SI in the setting of recent HepC and IV drug use. His hospital course is detailed below:  Transamintis AST 916, ALT 1238 on admission and slowly trended down during his stay (AST 374, ALT 808 at d/c). Hepatitis C positive, quant 441k. GI was consulted. Was not acutely encephalopathic and labs for HBV as well as autoimmune and metabolic causes of liver dysfunction were all negative. GI felt this was likely related to drug-ingestion. Will need outpatient follow up with ID for HepC management.  Polysubstance abuse Reported frequent recent use of suboxone, heroin, tobacco, and meth. Had multiple recent ED visits for heroin overdose. Bleach ingestion 1mo PTA but denied recent ingestion. COWS protocol during his stay, COWS remained <15, did not initiate suboxone inpatient.  Received nicotine patch. Also put on clonidine taper for meth withdrawal- this was d/c at time of discharge.  Denied EtOH use and CIWA scores were not elevated.  Suicidal Ideation Under IVC due to active SI with plan on admission. Was admitted 50mo prior for suicide attempt with bleach. Psych evaluated and cleared for discharge due to lack of motivation for residential rehab.  Schizophrenia vs substance induced psychosis Documented hx of paranoid schizophrenia. Psych evaluated and started  Zyprexa 2.5mg  PO BID.  Chest pain Had chest pain on admission and was noted to have ST changes on telemetry the day after admission. Given recent amphetamine use there was initially concern for ACS. EKG showed early ST elevations in inferior and lateral leads. Was given ASA 325mg  x1. CXR and troponins wnl. Thought to be most likely multifactorial in the setting of his polysubstance use. ST changes were thought to be due to early repolarization from LVH. Cardiology was consulted and recommended echo, consider this for outpatient.  Housing insecurity Pt homeless, was not motivated enough to be placed in residential rehab.  Other chronic conditions: Anxiety - gave PRN hydroxyzine, ativan  PCP Follow-up Recommendations: Patient needs to cease drug use Needs outpatient ID follow up for HepC treatment Consider outpatient echocardiogram for his LVH and EKG changes Follow up outpatient with Psychiatry for his schizophrenia and depression  Discharge Diagnoses/Problem List:  Principal Problem for Admission: Transaminitis, Active SI Other Problems addressed during stay:  Principal Problem:   Transaminitis Active Problems:   Polysubstance abuse (HCC)   Suicidal ideation   Chest pain   Paranoid schizophrenia (HCC)   EKG abnormalities   Anxiety   Housing insecurity   Disposition: home   Discharge Condition: stable  Discharge Exam:  Vitals:   11/26/21 0824 11/26/21 0916  BP: 130/85 119/79  Pulse: 65   Resp: 14   Temp: 98 F (36.7 C)   SpO2: 100%    General: Laying in bed Cardiovascular: RRR Respiratory: CTAB, normal WOB Abdomen: soft, diffuse moderate tenderness to palpation, unchanged from prior Extremities: Moves all extremities Psych: Denies active SI/HI  Issues for Follow Up:  Patient needs to cease drug use Needs outpatient  ID follow up for HepC treatment Consider outpatient echocardiogram for chest pain and EKG changes  Significant Procedures: n/a  Significant Labs  and Imaging:  Recent Labs  Lab 11/25/21 0331  WBC 7.4  HGB 12.9*  HCT 38.9*  PLT 353   Recent Labs  Lab 11/24/21 1840 11/25/21 0331 11/26/21 0130  NA  --  139 139  K  --  4.1 3.6  CL  --  107 111  CO2  --  27 23  GLUCOSE  --  107* 95  BUN  --  13 11  CREATININE  --  0.73 0.67  CALCIUM  --  9.2 7.9*  ALKPHOS 133* 138* 106  AST 805* 700* 374*  ALT 1,217* 1,152* 808*  ALBUMIN 3.1* 3.1* 2.7*    Pertinent Imaging:  Abdominal US: IMPRESSION: Small volume perihepatic free fluid. Otherwise unremarkable right upper quadrant ultrasound.  CXR: IMPRESSION: No acute cardiopulmonary disease.    Results/Tests Pending at Time of Discharge: n/a  Discharge Medications:  Allergies as of 11/26/2021       Reactions   Haldol [haloperidol] Swelling, Other (See Comments)   Tongue swelling  Dysarthria  Relieved with benadryl         Medication List     STOP taking these medications    buprenorphine-naloxone 8-2 mg Subl SL tablet Commonly known as: SUBOXONE   BUSPIRONE HCL PO   clonazePAM 1 MG tablet Commonly known as: KLONOPIN   gabapentin 300 MG capsule Commonly known as: NEURONTIN   sertraline 50 MG tablet Commonly known as: ZOLOFT       TAKE these medications    nicotine 21 mg/24hr patch Commonly known as: NICODERM CQ - dosed in mg/24 hours Place 1 patch (21 mg total) onto the skin daily. Start taking on: November 27, 2021   OLANZapine 5 MG tablet Commonly known as: ZYPREXA Take 0.5 tablets (2.5 mg total) by mouth 2 (two) times daily. What changed:  medication strength how much to take when to take this        Discharge Instructions: Please refer to Patient Instructions section of EMR for full details.  Patient was counseled important signs and symptoms that should prompt return to medical care, changes in medications, dietary instructions, activity restrictions, and follow up appointments.   Follow-Up Appointments:  Follow-up Information      Pc, [employer]. Schedule an appointment as soon as possible for a visit in 1 week(s).   Contact information: [address] [place] [address] [phone]                 Vonna Drafts, MD 11/26/2021, 11:37 AM PGY-1, [facility]  FPTS Upper-Level Resident Addendum   I have independently interviewed and examined the patient. I have discussed the above with the original author and agree with their documentation. My edits for correction/addition/clarification are included where appropriate. Please see also any attending notes.   Sabino Dick, DO PGY-3, [place] [facility] 11/26/2021 12:20 PM  FPTS Service pager: [phone] (text pages welcome through [employer])

## 2021-11-26 NOTE — ED Notes (Signed)
Pt ate his breakfast and then requested a shower. Tech in yellow zone gathered shower supplies and disposable clothes for pt. This tech and zone tech escorted pt to the shower. Pt showered and dressed. Pt given two warm blankets and is in bed resting comfortably at this time.

## 2021-11-26 NOTE — ED Notes (Signed)
Pt has made both of his phone calls for the day, he keeps coming out of his room but is redirectable.

## 2021-11-26 NOTE — ED Notes (Signed)
Pt made first phone call of the day to call his mom.

## 2021-11-26 NOTE — Discharge Instructions (Signed)
Dear Cody Poole,   Thank you for letting us participate in your care! We have attached some resources for your benefit.    POST-HOSPITAL & CARE INSTRUCTIONS Please follow-up with your PCP. Please let PCP/Specialists know of any changes in medications that were made.  Please see medications section of this packet for any medication changes.   DOCTOR'S APPOINTMENTS & FOLLOW UP Future Appointments  Date Time Provider Department Center  11/26/2021 12:00 PM MC ECHO 8 [facility]-ECHOLAB [facility]     Thank you for choosing [facility]! Take care and be well!  Family Medicine Teaching Service Inpatient Team [place]  [facility]  [address] [place], [place] [address] [phone]

## 2021-11-26 NOTE — ED Notes (Signed)
Pt at this time states that he is hearing and seeing things and that it wad telling him to hurt people but that he wasn't going to listen to it

## 2021-11-26 NOTE — ED Notes (Signed)
Pt just ambulated to the bathroom w/complaints of an upset stomach. RN aware. Pt is back in bed resting, calm and cooperative at this time. Waiting for breakfast tray.

## 2021-11-26 NOTE — ED Notes (Signed)
Pt given filled prescription of Olanzaprine on DC.

## 2021-11-26 NOTE — Consult Note (Signed)
[facility] Psychiatry Follow-Up Face-to-Face Psychiatric Evaluation  Date of Service: November 26, 2021 Name: Cody Poole DOB: 03-14-1996 MRN: [id number] Reason for Consult: "Psychosis" Requesting Provider: Nestor Ramp, MD  Assessment  Cody Poole is a 26 y.o. male admitted medically for 11/24/2021  3:02 AM for Transaminitis [R74.01]. 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 Patient has presented multiple times to the ED for similar complaints, but would ultimately deny all sxs and request to be discharged. Patient see's an outpatient provider for suboxone. Patient was agreeable to residential rehab and housing and has been provided with resources by [facility]. Patient does not meet requirement for psych hospitalization.   Please see plan below for detailed recommendations.   Diagnoses:  Active Hospital problems: Principal Problem:   Transaminitis Active Problems:   Paranoid schizophrenia (HCC)   Polysubstance abuse (HCC)   EKG abnormalities   Anxiety   Suicidal ideation   Housing insecurity   Chest pain  Plan  ## Safety and Observation Level:  - Based on my clinical evaluation, I estimate the patient to be at medium risk of self harm in the current setting - At this time, we recommend a routine level of observation. This decision is based on my review of the chart including patient's history and current presentation, interview of the patient, mental status examination, and consideration of suicide risk including evaluating suicidal ideation, plan, intent, suicidal or self-harm  behaviors, risk factors, and protective factors. This judgment is based on our ability to directly address suicide risk, implement suicide prevention strategies and develop a safety plan while the patient is in the clinical setting. Please contact our team if there is a concern that risk level has changed.  ## Medications:  -- Continued zyprexa 2.5mg  BID -- Clonidine taper  ## Medical Decision Making Capacity:  Formal decision making capacity was not assessed for any particular decision as part of routine psychiatric evaluation  Court Appointed Legal Guardian: No   ## Further Work-up:  -- Per primary team   -- Most recent EKG on Monday November 24 2021 15:55:55 EDT had QtC of 432 -- TSH 5.604 (05/15/2021)  ## Others: -- Recommend rescinding IVC, that has been communicated to primary team  ## Disposition:  -- There are no current psychiatric contraindications to discharge at this time -- Patient was provide resources for residential rehab -- Continue follow-up with outpatient psychiatrist -- Per primary team  ## Behavioral / Environmental:  -- CIWA per protocol -- COWS per protocol  -- Elopement precautions -- Suicide precautions  ##Legal Status -- Involuntary   Thank you for this consult request. Recommendations have been communicated to the primary team. We will sign off at this time; please do not hesitate to reconsult psychiatry if any additional questions or concerns arise.   Princess Bruins, DO  History  Relevant Aspects of Hospital Course:  Admitted on 11/24/2021 for Transaminitis [R74.01].  Patient Report:  Sitter at bedside.   Patient was initially seen this AM, awake and eating, no acute distress.  Patient stated that he slept well, mood is "good".  Initially patient endorsed AVH, stating  that "they tell me depending on what you guys say".  Denied that AVH are commanding in nature.  Stated that he sees and hears little aliens. Denied paranoia.  Stated that Zyprexa helped  him fall asleep, however did not help with the AVH. Patient stated that he wants [place] to transport him to [facility] or residential rehab to help him with resources.  However patient is unable to describe what kind of resources he wants or needs.  When discussed with patient that [facility] will be helping him with residential rehab by providing him numbers and a phone to assist with intake, patient then requested to leave and denied symptoms of SI and AVH.  Patient then requested that we discharge him today.   ROS:  Appetite, mood and sleep are appropriate and stable.  Today, patient SI/HI/AVH, paranoia.  Did not appear internally preoccupied.  Last time patient endorsed SI was when he initially presented to the ED.  Safety: Patient denied access or owning guns or weapons.  Stated that if he were to ever have worsening symptoms or suicidal thoughts, that he would present to the ED or call 911.   Psychiatric History:  Information collected from chart review and patient Past psych diagnoses: Reported schizophrenia, [name], PTSD, heroin use disorder (IVDU), amphetamine use disorder, cocaine use disorder, cannabis use disorder, tobacco use disorder. Rx: none currenlty Suicide attempts: reported "swig of bleach" (10/14/2021) Psychiatric med trials: Haldol 10 mg twice daily.  Patient reported history of LAI, Zyprexa 10mg  daily, gabapentin 300mg  daily Current outpatient psychiatrist: Dr. at [facility] Current outpatient therapist: Denied History of selective adherence: Yes  Family Psychiatric History: Completed/attempted suicide: Denied Bipolar spectrum disorder: Denied Schizophrenia spectrum disorder: Father Substance abuse: Denied  Social History:  Alcohol: Intermittently-when he can get alcohol Nicotine: 1 PPD daily since 26 years old Illicit drugs: IVDU-meth, cocaine, Heroin daily ("1/2 gram for the past 3-4 years..unless I have suboxone"), marijuana use. Rx drug abuse: Denied Seizure hx: Denied Others:  Benprenorphine-naloxone (2021) Income: Social Security Housing: Homeless Was living with mom in 2022  Family History:  The patient's Family history is unknown by patient.  Medical History: Past Medical History:  Diagnosis Date   Anxiety    Back pain    Depression    Hypertension    Paranoid schizophrenia (HCC)     Surgical History: History reviewed. No pertinent surgical history.  Medications:   Current Facility-Administered Medications:    [EXPIRED] cloNIDine (CATAPRES) tablet 0.1 mg, 0.1 mg, Oral, QID, 0.1 mg at 11/26/21 0916 **FOLLOWED BY** cloNIDine (CATAPRES) tablet 0.1 mg, 0.1 mg, Oral, BID **FOLLOWED BY** [START ON 11/29/2021] cloNIDine (CATAPRES) tablet 0.1 mg, 0.1 mg, Oral, Daily, 11/28/21 A, PA-C   dicyclomine (BENTYL) tablet 20 mg, 20 mg, Oral, Q6H PRN, 12/01/2021 A, PA-C, 20 mg at 11/26/21 1144   enoxaparin (LOVENOX) injection 40 mg, 40 mg, Subcutaneous, Q24H, Maxwell, Allee, MD, 40 mg at 11/25/21 1322   hydrOXYzine (ATARAX) tablet 50 mg, 50 mg, Oral, TID PRN, 11/28/21, Alejandra, DO, 50 mg at 11/25/21 2144   loperamide (IMODIUM) capsule 2-4 mg, 2-4 mg, Oral, PRN, 11/27/21, Allee, MD, 4 mg at 11/25/21 0956   LORazepam (ATIVAN) tablet 1 mg, 1 mg, Oral, Q6H PRN, Espinoza, Alejandra, DO, 1 mg at 11/25/21 2144   methocarbamol (ROBAXIN) tablet 500 mg, 500 mg, Oral, Q8H PRN, 11/27/21 A, PA-C, 500 mg at 11/26/21 0308   naproxen (NAPROSYN) tablet 500 mg, 500 mg, Oral, BID PRN, Army Melia A, PA-C, 500 mg  at 11/25/21 0750   nicotine (NICODERM CQ - dosed in mg/24 hours) patch 21 mg, 21 mg, Transdermal, Daily, Mahmood, Atif, MD, 21 mg at 11/26/21 0918   OLANZapine zydis (ZYPREXA) disintegrating tablet 2.5 mg, 2.5 mg, Oral, BID, Starkes-Perry, Takia S, FNP, 2.5 mg at 11/26/21 0916   ondansetron (ZOFRAN-ODT) disintegrating tablet 4 mg, 4 mg, Oral, Q6H PRN, Jeannie Fend, PA-C, 4 mg at 11/24/21 2003  Current Outpatient Medications:    [START ON 11/27/2021] nicotine  (NICODERM CQ - DOSED IN MG/24 HOURS) 21 mg/24hr patch, Place 1 patch (21 mg total) onto the skin daily., Disp: 28 patch, Rfl: 0   OLANZapine (ZYPREXA) 5 MG tablet, Take 0.5 tablets (2.5 mg total) by mouth 2 (two) times daily., Disp: 30 tablet, Rfl: 0  Allergies: Allergies  Allergen Reactions   Haldol [Haloperidol] Swelling and Other (See Comments)    Tongue swelling  Dysarthria  Relieved with benadryl      Objective  Vital signs:  BMI Body mass index is 21.71 kg/m. Temp:  [98 F (36.7 C)-98.9 F (37.2 C)] 98.5 F (36.9 C) (08/16 1630) Pulse Rate:  [52-129] 80 (08/16 1630) Resp:  [1-24] 15 (08/16 1630) BP: (115-141)/(73-95) 140/85 (08/16 1630) SpO2:  [97 %-100 %] 100 % (08/16 1630)  Psychiatric Specialty Exam: General Appearance: Disheveled   Eye Contact: Fair   Speech: Clear and Coherent; Normal Rate   Volume: Normal    Mood: Dysphoric  Affect: Appropriate; Congruent; Full Range    Thought Process: Coherent; Goal Directed; Linear  Descriptions of Associations: Intact  Duration of Psychotic Symptoms: Greater than six months   Past Diagnosis of Schizophrenia or Psychoactive disorder: N/A   Orientation: Full (Time, Place and Person)   Thought Content: Perseveration (Patient perseverated on wanting to go to [facility], stated that they were able to help him with "resources".  However patient denied SI.  Continued to endorse chronic AVH, noncommanding in nature, and mood congruent.)  Hallucinations: Auditory; Visual  Little aliens saying mean things to him. Denied commanding him to kill/hurt himself or other people. Description of Visual Hallucinations: Little aliens   Ideas of Reference: None   Suicidal Thoughts: No  Homicidal Thoughts: No   Memory: Immediate Good    Judgement: Poor  Insight: Lacking    Psychomotor Activity: Normal -- (None) Yes (score zero on 05-16-2021)    Concentration: Fair  Attention Span: Fair  Recall: Poor     Fund of Knowledge: Fair    Language: Fair    Handed: Right    Assets: Manufacturing systems engineer; Desire for Improvement; Resilience    Sleep: Good    COWS:COWS Total Score: 7    Physical Exam: Physical Exam Vitals and nursing note reviewed. Exam conducted with a chaperone present.  HENT:     Head: Normocephalic.  Pulmonary:     Effort: Pulmonary effort is normal. No respiratory distress.  Neurological:     Mental Status: He is alert and oriented to person, place, and time.     Review of Systems  Respiratory:  Negative for shortness of breath.   Cardiovascular:  Negative for chest pain.  Gastrointestinal:  Positive for abdominal pain. Negative for nausea and vomiting.  Neurological:  Negative for tremors.    Signed: Princess Bruins, DO Psychiatry Resident, PGY-2 Psychiatry Consult Service [facility] [place] 11/26/2021, 4:36 PM

## 2021-11-26 NOTE — ED Notes (Signed)
NT came and advised pt states he is having suicidal thoughts. Asked pt if he was feeling suicidal and he states he is. This nurse asked what changed and he stated "I am scared to go out into the world", This nurse asked if it was because his mother was not coming to get him and he stated yes. Provider and psychiatry notified.

## 2021-11-26 NOTE — ED Notes (Addendum)
Per Darral Dash DO pt is good to be discharged.

## 2021-12-22 ENCOUNTER — Other Ambulatory Visit: Payer: Self-pay

## 2021-12-22 ENCOUNTER — Emergency Department (HOSPITAL_COMMUNITY)
Admission: EM | Admit: 2021-12-22 | Discharge: 2021-12-22 | Disposition: A | Discharge status: Home / Self Care | Payer: No Typology Code available for payment source | Attending: Emergency Medicine | Admitting: Emergency Medicine

## 2021-12-22 ENCOUNTER — Encounter (HOSPITAL_COMMUNITY): Payer: Self-pay

## 2021-12-22 DIAGNOSIS — T40604A Poisoning by unspecified narcotics, undetermined, initial encounter: Secondary | ICD-10-CM

## 2021-12-22 DIAGNOSIS — T40601A Poisoning by unspecified narcotics, accidental (unintentional), initial encounter: Secondary | ICD-10-CM | POA: Insufficient documentation

## 2021-12-22 DIAGNOSIS — R400 Somnolence: Secondary | ICD-10-CM | POA: Insufficient documentation

## 2021-12-22 DIAGNOSIS — Z20822 Contact with and (suspected) exposure to covid-19: Secondary | ICD-10-CM | POA: Insufficient documentation

## 2021-12-22 LAB — CBC WITH DIFFERENTIAL/PLATELET
Abs Immature Granulocytes: 0.02 10*3/uL (ref 0.00–0.07)
Basophils Absolute: 0 10*3/uL (ref 0.0–0.1)
Basophils Relative: 1 %
Eosinophils Absolute: 0.1 10*3/uL (ref 0.0–0.5)
Eosinophils Relative: 1 %
HCT: 36.3 % — ABNORMAL LOW (ref 39.0–52.0)
Hemoglobin: 11.8 g/dL — ABNORMAL LOW (ref 13.0–17.0)
Immature Granulocytes: 0 %
Lymphocytes Relative: 17 %
Lymphs Abs: 1.2 10*3/uL (ref 0.7–4.0)
MCH: 28.7 pg (ref 26.0–34.0)
MCHC: 32.5 g/dL (ref 30.0–36.0)
MCV: 88.3 fL (ref 80.0–100.0)
Monocytes Absolute: 0.5 10*3/uL (ref 0.1–1.0)
Monocytes Relative: 7 %
Neutro Abs: 5.4 10*3/uL (ref 1.7–7.7)
Neutrophils Relative %: 74 %
Platelets: 299 10*3/uL (ref 150–400)
RBC: 4.11 MIL/uL — ABNORMAL LOW (ref 4.22–5.81)
RDW: 14 % (ref 11.5–15.5)
WBC: 7.3 10*3/uL (ref 4.0–10.5)
nRBC: 0 % (ref 0.0–0.2)

## 2021-12-22 LAB — RAPID URINE DRUG SCREEN, HOSP PERFORMED
Amphetamines: POSITIVE — AB
Barbiturates: NOT DETECTED
Benzodiazepines: NOT DETECTED
Cocaine: POSITIVE — AB
Opiates: POSITIVE — AB
Tetrahydrocannabinol: POSITIVE — AB

## 2021-12-22 LAB — RESP PANEL BY RT-PCR (FLU A&B, COVID) ARPGX2
Influenza A by PCR: NEGATIVE
Influenza B by PCR: NEGATIVE
SARS Coronavirus 2 by RT PCR: NEGATIVE

## 2021-12-22 LAB — COMPREHENSIVE METABOLIC PANEL
ALT: 27 U/L (ref 0–44)
AST: 40 U/L (ref 15–41)
Albumin: 3.5 g/dL (ref 3.5–5.0)
Alkaline Phosphatase: 96 U/L (ref 38–126)
Anion gap: 3 — ABNORMAL LOW (ref 5–15)
BUN: 16 mg/dL (ref 6–20)
CO2: 29 mmol/L (ref 22–32)
Calcium: 9 mg/dL (ref 8.9–10.3)
Chloride: 106 mmol/L (ref 98–111)
Creatinine, Ser: 0.71 mg/dL (ref 0.61–1.24)
GFR, Estimated: >60 mL/min (ref >60)
Glucose, Bld: 109 mg/dL — ABNORMAL HIGH (ref 70–99)
Potassium: 4.1 mmol/L (ref 3.5–5.1)
Sodium: 138 mmol/L (ref 135–145)
Total Bilirubin: 0.6 mg/dL (ref 0.3–1.2)
Total Protein: 6.8 g/dL (ref 6.5–8.1)

## 2021-12-22 LAB — ETHANOL: Alcohol, Ethyl (B): <10 mg/dL (ref <10)

## 2021-12-22 LAB — ACETAMINOPHEN LEVEL: Acetaminophen (Tylenol), Serum: <10 ug/mL — ABNORMAL LOW (ref 10–30)

## 2021-12-22 LAB — SALICYLATE LEVEL: Salicylate Lvl: <7 mg/dL — ABNORMAL LOW (ref 7.0–30.0)

## 2021-12-22 MED ORDER — NALOXONE HCL 4 MG/0.1ML NA LIQD: 0.4000 mg | Freq: Once | NASAL | Status: DC

## 2021-12-22 MED ORDER — NALOXONE HCL 0.4 MG/ML IJ SOLN
0.4000 mg | Freq: Once | INTRAMUSCULAR | Status: AC
  Administered 2021-12-22: 0.4 mg via INTRAVENOUS
  Filled 2021-12-22: qty 1

## 2021-12-22 MED ORDER — NALOXONE HCL 2 MG/2ML IJ SOSY
PREFILLED_SYRINGE | INTRAMUSCULAR | Status: AC
  Filled 2021-12-22: qty 2

## 2021-12-22 NOTE — ED Provider Notes (Signed)
[facility]-EMERGENCY DEPT Provider Note   CSN: [id number] Arrival date & time: 12/22/21  1258     History  Chief Complaint  Patient presents with   Drug Overdose    Cody Poole is a 26 y.o. male.  26 year old male with known history of narcotic abuse presents after apparently shooting up heroin.  Patient with decreased mentation.  Multiple track marks on arms.  Patient admits to shooting up heroin in an attempt to get high.  The history is provided by the patient and medical records.       Home Medications Prior to Admission medications   Medication Sig Start Date End Date Taking? Authorizing Provider  nicotine (NICODERM CQ - DOSED IN MG/24 HOURS) 21 mg/24hr patch Place 1 patch (21 mg total) onto the skin daily. 11/27/21   Dameron, Nolberto Hanlon, DO  OLANZapine (ZYPREXA) 5 MG tablet Take 0.5 tablets (2.5 mg total) by mouth 2 (two) times daily. 11/26/21   Dameron, Nolberto Hanlon, DO      Allergies    Haldol [haloperidol]    Review of Systems   Review of Systems  All other systems reviewed and are negative.   Physical Exam Updated Vital Signs BP (!) 126/93   Pulse 71   Temp 97.9 F (36.6 C)   Resp 16   SpO2 96%  Physical Exam Vitals and nursing note reviewed.  Constitutional:      General: He is not in acute distress.    Appearance: Normal appearance. He is well-developed.     Comments: Somnolent but arousable.  Reports IV drug use and attempt to get high.  HENT:     Head: Normocephalic and atraumatic.  Eyes:     Conjunctiva/sclera: Conjunctivae normal.     Pupils: Pupils are equal, round, and reactive to light.  Cardiovascular:     Rate and Rhythm: Normal rate and regular rhythm.     Heart sounds: Normal heart sounds.  Pulmonary:     Effort: Pulmonary effort is normal. No respiratory distress.     Breath sounds: Normal breath sounds.  Abdominal:     General: There is no distension.     Palpations: Abdomen is soft.     Tenderness: There is no  abdominal tenderness.  Musculoskeletal:        General: No deformity. Normal range of motion.     Cervical back: Normal range of motion and neck supple.  Skin:    General: Skin is warm and dry.  Neurological:     General: No focal deficit present.     Mental Status: He is alert and oriented to person, place, and time.     ED Results / Procedures / Treatments   Labs (all labs ordered are listed, but only abnormal results are displayed) Labs Reviewed - No data to display  EKG None  Radiology No results found.  Procedures Procedures    Medications Ordered in ED Medications  naloxone (NARCAN) injection 0.4 mg (0.4 mg Intravenous Given 12/22/21 1318)    ED Course/ Medical Decision Making/ A&P                           Medical Decision Making Risk Prescription drug management.    Medical Screen Complete  This patient presented to the ED with complaint of heroin overdose.  This complaint involves an extensive number of treatment options. The initial differential diagnosis includes, but is not limited to, heroin overdose  This presentation is:  Acute, Self-Limited, Previously Undiagnosed, Uncertain Prognosis, Complicated, Systemic Symptoms, and Threat to Life/Bodily Function  Patient with known history of heroin use presents after shooting up heroin.  He was somnolent upon arrival.  Airway is protected.  Patient without significant respiratory distress or suppression.  After observation period in the ED the patient is improved.  He is able to ambulate.  He desires discharge.  Importance of close follow-up is stressed.  Strict return precautions given and understood.  Additional history obtained:  External records from outside sources obtained and reviewed including prior ED visits and prior Inpatient records.   Problem List / ED Course:  Heroin overdose   Reevaluation:  After the interventions noted above, I reevaluated the patient and found that they have:  improved  Disposition:  After consideration of the diagnostic results and the patients response to treatment, I feel that the patent would benefit from close outpatient followup.          Final Clinical Impression(s) / ED Diagnoses Final diagnoses:  Narcotic overdose, undetermined intent, initial encounter [facility])    Rx / DC Orders ED Discharge Orders     None         Wynetta Fines, MD 12/22/21 1627

## 2021-12-22 NOTE — ED Notes (Signed)
MD Eloise Harman informed of ED RN's conversation w/ pt's mother.

## 2021-12-22 NOTE — Discharge Instructions (Signed)
Return for any problem.  ?

## 2021-12-22 NOTE — ED Notes (Signed)
Pt self removed IV, pt started ambulating w/ steady gait out of ed.  MD Philip Aspen met pt in hallway by ambulance bay.  MD Philip Aspen able to convince pt to step off to the side in the hallway to evaluate pt.  MD Philip Aspen gave verbal ok to this ED RN and security for pt to walk out of ed discharged.  MD Philip Aspen able to get pt's mother's phone number to call her and discuss pt's status and dc with her.  Pt gave verbal permission to MD Philip Aspen to talk to his mother and gave MD Philip Aspen her phone number, witnessed by this ED RN.  Pt escorted out of the ed by this ed rn into ed lobby. No belongings left at bedside by pt upon exit.  Pt did not wait for dc paperwork.

## 2021-12-22 NOTE — ED Notes (Addendum)
Pt refusing to change into BH scrubs.  Pt threatening to leave.  MD Eloise Harman notified and aware.  MD to come see and talk w/ pt.  Pt not currently under IVC. Pt has made no mention of SI or HI or made mention of any hallucinations or thoughts of wanting to harm himself to this ED RN.

## 2021-12-22 NOTE — ED Provider Notes (Signed)
  Physical Exam  BP 108/74   Pulse 64   Temp 97.6 F (36.4 C) (Axillary)   Resp 16   SpO2 100%   Physical Exam Vitals and nursing note reviewed.  Constitutional:      General: He is not in acute distress.    Appearance: He is well-developed.     Comments: Drowsy but responds easily to voice.  Conversant.  HENT:     Head: Normocephalic and atraumatic.     Mouth/Throat:     Mouth: Mucous membranes are moist.     Pharynx: Oropharynx is clear.  Eyes:     Conjunctiva/sclera: Conjunctivae normal.     Pupils: Pupils are equal, round, and reactive to light.     Comments: Pupils 3 mm bilaterally  Cardiovascular:     Rate and Rhythm: Normal rate and regular rhythm.     Heart sounds: No murmur heard. Pulmonary:     Effort: Pulmonary effort is normal. No respiratory distress.  Abdominal:     Palpations: Abdomen is soft.  Musculoskeletal:        General: No swelling.     Cervical back: Neck supple.  Skin:    General: Skin is warm and dry.     Capillary Refill: Capillary refill takes less than 2 seconds.  Neurological:     Mental Status: He is oriented to person, place, and time. Mental status is at baseline.     Comments: Moving all 4 extremities.  No gross cranial nerve deficits.  Psychiatric:        Mood and Affect: Mood normal.     Procedures  Procedures  ED Course / MDM   Clinical Course as of 12/22/21 1921  Mon Dec 22, 2021  1733 Patient was pending discharge and nurse was called by patient's mother who reported that he was attempting to kill himself by overdosing.  Labs and TTS consult placed. [RP]  1836 Patient ambulating with steady gait.  He is currently alert and oriented x3.  Does not appear to be intoxicated.  I asked the patient about an attempt to harm himself which he denied.  Says he has not had any thoughts of homicidal or suicidal ideation.  Unable to keep the patient against his will since he appears to be clinically sober and is not an acute threat to  himself.  Patient did not wish to stay for his discharge paperwork. [RP]    Clinical Course User Index [RP] Rondel Baton, MD   Medical Decision Making Amount and/or Complexity of Data Reviewed Labs: ordered.  Risk Prescription drug management.      Rondel Baton, MD 12/22/21 [phone]

## 2021-12-22 NOTE — ED Triage Notes (Signed)
Pt coming via EMS from behind a business with c/o of OD on heroin. Hx of same.

## 2021-12-22 NOTE — ED Notes (Signed)
Pt's mother called ed concerned about pt.  Pt gave verbal permission for ed rn to talk with his mother. Pt's mother stated concerns for pt being suicidal and trying to kill himself via overdose.  ED RN informed mother that she is more than welcome to talk w/ magistrate and see about putting pt under IVC. Pt's mother became extremely rude and disrespectful to ed rn while on the phone.  ED RN informed mother that this ed rn will not be spoken to that way and call was ended.

## 2022-04-27 IMAGING — DX DG CHEST 1V PORT
1 series · 1 of 1 positions shown · non-contrast
Comparison: October 13, 2020.

CLINICAL DATA: Body aches.

EXAM:
PORTABLE CHEST 1 VIEW

[chest ap]
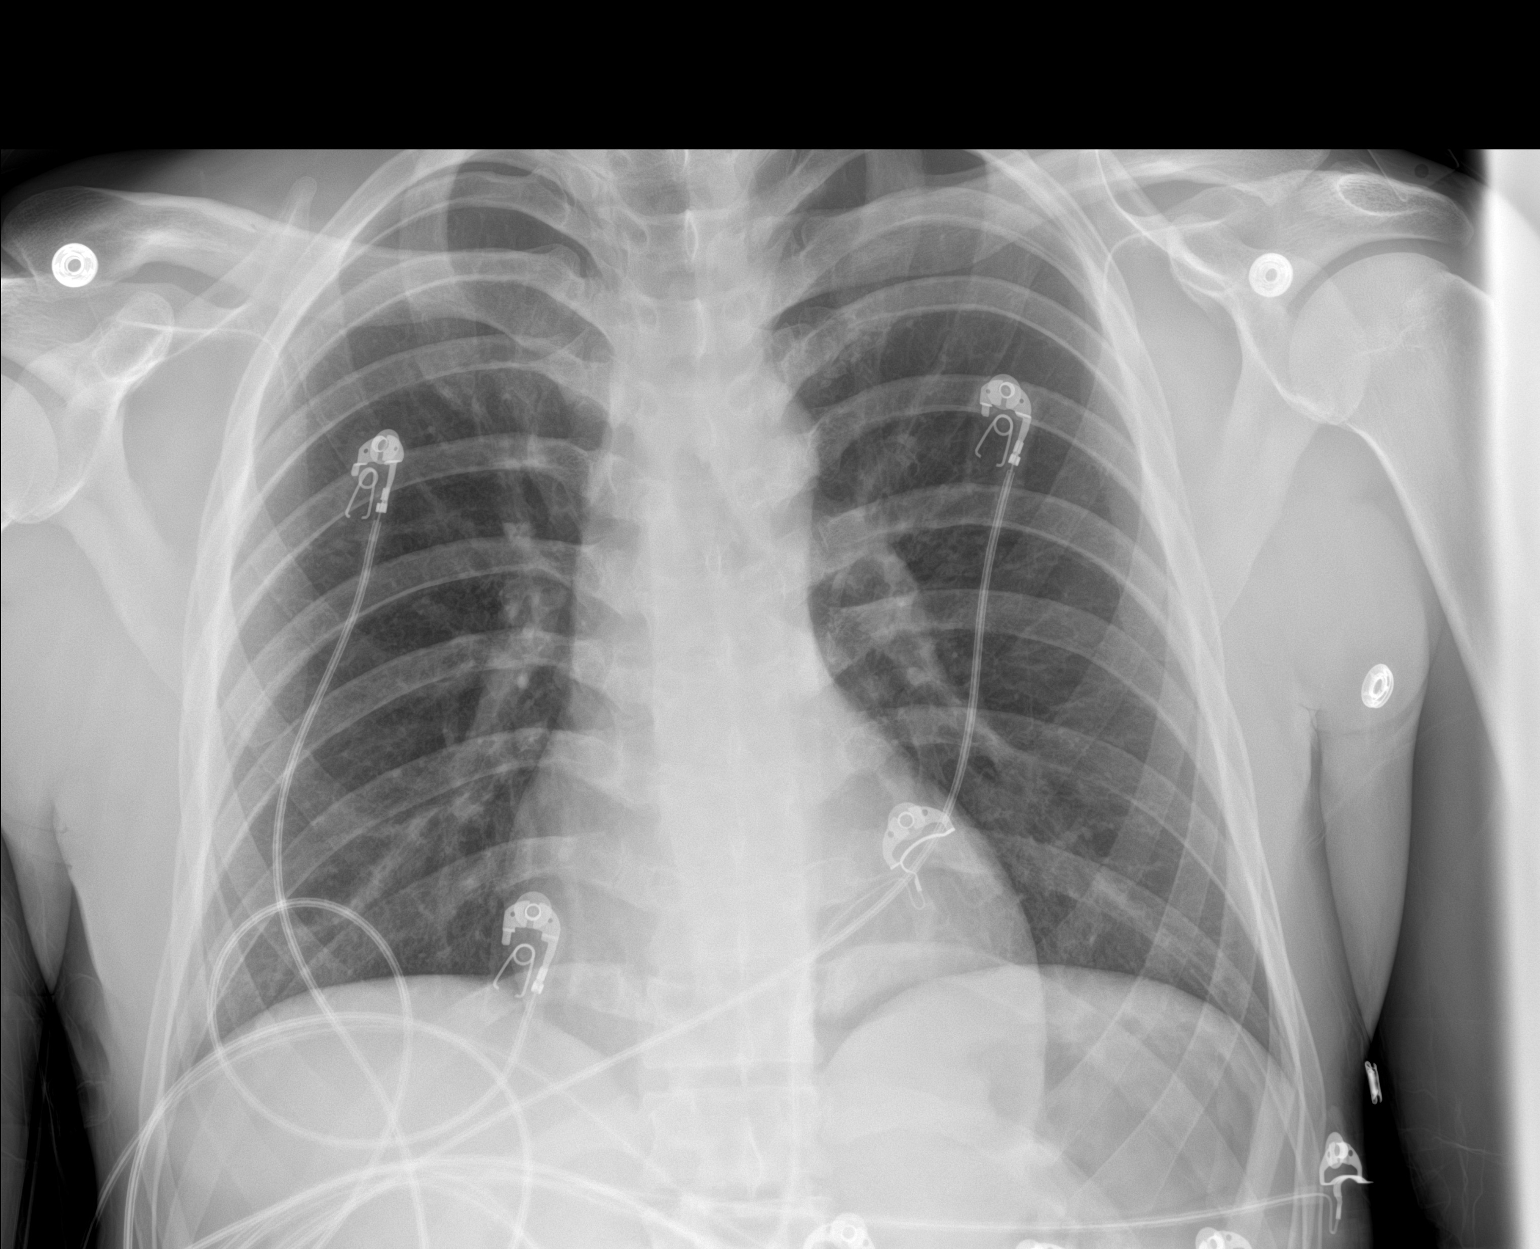

[1 of 1 positions shown; findings below may reference images not displayed]

FINDINGS: The heart size and mediastinal contours are within normal limits.
Both lungs are clear. The visualized skeletal structures are
unremarkable.
IMPRESSION: No active disease.

## 2022-05-08 ENCOUNTER — Emergency Department (HOSPITAL_COMMUNITY): Payer: No Typology Code available for payment source

## 2022-05-08 ENCOUNTER — Encounter (HOSPITAL_COMMUNITY): Payer: Self-pay | Admitting: Neurosurgery

## 2022-05-08 ENCOUNTER — Inpatient Hospital Stay (HOSPITAL_COMMUNITY)
Admission: EM | Admit: 2022-05-08 | Discharge: 2022-05-13 | DRG: 083 | Payer: No Typology Code available for payment source | Attending: Neurosurgery | Admitting: Neurosurgery

## 2022-05-08 ENCOUNTER — Other Ambulatory Visit: Payer: Self-pay

## 2022-05-08 ENCOUNTER — Encounter (HOSPITAL_COMMUNITY): Payer: Self-pay

## 2022-05-08 DIAGNOSIS — S064XAA Epidural hemorrhage with loss of consciousness status unknown, initial encounter: Secondary | ICD-10-CM | POA: Diagnosis not present

## 2022-05-08 DIAGNOSIS — Z888 Allergy status to other drugs, medicaments and biological substances status: Secondary | ICD-10-CM | POA: Diagnosis not present

## 2022-05-08 DIAGNOSIS — F419 Anxiety disorder, unspecified: Secondary | ICD-10-CM | POA: Diagnosis present

## 2022-05-08 DIAGNOSIS — F1210 Cannabis abuse, uncomplicated: Secondary | ICD-10-CM | POA: Diagnosis present

## 2022-05-08 DIAGNOSIS — I10 Essential (primary) hypertension: Secondary | ICD-10-CM | POA: Diagnosis not present

## 2022-05-08 DIAGNOSIS — W182XXA Fall in (into) shower or empty bathtub, initial encounter: Secondary | ICD-10-CM | POA: Diagnosis not present

## 2022-05-08 DIAGNOSIS — F200 Paranoid schizophrenia: Secondary | ICD-10-CM | POA: Diagnosis present

## 2022-05-08 DIAGNOSIS — R55 Syncope and collapse: Secondary | ICD-10-CM | POA: Diagnosis present

## 2022-05-08 DIAGNOSIS — Z79899 Other long term (current) drug therapy: Secondary | ICD-10-CM

## 2022-05-08 DIAGNOSIS — F1410 Cocaine abuse, uncomplicated: Secondary | ICD-10-CM | POA: Diagnosis present

## 2022-05-08 DIAGNOSIS — F17200 Nicotine dependence, unspecified, uncomplicated: Secondary | ICD-10-CM | POA: Diagnosis not present

## 2022-05-08 DIAGNOSIS — F32A Depression, unspecified: Secondary | ICD-10-CM | POA: Diagnosis present

## 2022-05-08 DIAGNOSIS — F1910 Other psychoactive substance abuse, uncomplicated: Secondary | ICD-10-CM | POA: Diagnosis not present

## 2022-05-08 DIAGNOSIS — Y9289 Other specified places as the place of occurrence of the external cause: Secondary | ICD-10-CM

## 2022-05-08 DIAGNOSIS — S0003XA Contusion of scalp, initial encounter: Secondary | ICD-10-CM | POA: Diagnosis present

## 2022-05-08 DIAGNOSIS — R569 Unspecified convulsions: Secondary | ICD-10-CM | POA: Diagnosis not present

## 2022-05-08 DIAGNOSIS — F1510 Other stimulant abuse, uncomplicated: Secondary | ICD-10-CM | POA: Diagnosis not present

## 2022-05-08 DIAGNOSIS — S065XAA Traumatic subdural hemorrhage with loss of consciousness status unknown, initial encounter: Principal | ICD-10-CM | POA: Diagnosis present

## 2022-05-08 DIAGNOSIS — F1010 Alcohol abuse, uncomplicated: Secondary | ICD-10-CM | POA: Diagnosis present

## 2022-05-08 DIAGNOSIS — M542 Cervicalgia: Secondary | ICD-10-CM | POA: Diagnosis present

## 2022-05-08 DIAGNOSIS — S064X0A Epidural hemorrhage without loss of consciousness, initial encounter: Secondary | ICD-10-CM | POA: Diagnosis present

## 2022-05-08 HISTORY — DX: Tobacco use: Z72.0

## 2022-05-08 HISTORY — DX: Traumatic subdural hemorrhage with loss of consciousness status unknown, initial encounter: S06.5XAA

## 2022-05-08 HISTORY — DX: Spondylosis without myelopathy or radiculopathy, cervical region: M47.812

## 2022-05-08 HISTORY — DX: Alcohol abuse, uncomplicated: F10.10

## 2022-05-08 HISTORY — DX: Opioid abuse with withdrawal: F11.13

## 2022-05-08 HISTORY — DX: Other psychoactive substance abuse, uncomplicated: F19.10

## 2022-05-08 HISTORY — DX: Opioid abuse, uncomplicated: F11.10

## 2022-05-08 HISTORY — DX: Other stimulant abuse, uncomplicated: F15.10

## 2022-05-08 LAB — CBC WITH DIFFERENTIAL/PLATELET
Abs Immature Granulocytes: 0.02 10*3/uL (ref 0.00–0.07)
Basophils Absolute: 0.1 10*3/uL (ref 0.0–0.1)
Basophils Relative: 1 %
Eosinophils Absolute: 0 10*3/uL (ref 0.0–0.5)
Eosinophils Relative: 0 %
HCT: 43.2 % (ref 39.0–52.0)
Hemoglobin: 13.5 g/dL (ref 13.0–17.0)
Immature Granulocytes: 0 %
Lymphocytes Relative: 12 %
Lymphs Abs: 1.1 10*3/uL (ref 0.7–4.0)
MCH: 27.8 pg (ref 26.0–34.0)
MCHC: 31.3 g/dL (ref 30.0–36.0)
MCV: 89.1 fL (ref 80.0–100.0)
Monocytes Absolute: 0.4 10*3/uL (ref 0.1–1.0)
Monocytes Relative: 5 %
Neutro Abs: 7.5 10*3/uL (ref 1.7–7.7)
Neutrophils Relative %: 82 %
Platelets: 357 10*3/uL (ref 150–400)
RBC: 4.85 MIL/uL (ref 4.22–5.81)
RDW: 13.3 % (ref 11.5–15.5)
WBC: 9.1 10*3/uL (ref 4.0–10.5)
nRBC: 0 % (ref 0.0–0.2)

## 2022-05-08 LAB — COMPREHENSIVE METABOLIC PANEL
ALT: 15 U/L (ref 0–44)
AST: 18 U/L (ref 15–41)
Albumin: 4 g/dL (ref 3.5–5.0)
Alkaline Phosphatase: 70 U/L (ref 38–126)
Anion gap: 9 (ref 5–15)
BUN: 11 mg/dL (ref 6–20)
CO2: 23 mmol/L (ref 22–32)
Calcium: 9.4 mg/dL (ref 8.9–10.3)
Chloride: 106 mmol/L (ref 98–111)
Creatinine, Ser: 0.74 mg/dL (ref 0.61–1.24)
GFR, Estimated: >60 mL/min (ref >60)
Glucose, Bld: 152 mg/dL — ABNORMAL HIGH (ref 70–99)
Potassium: 4.3 mmol/L (ref 3.5–5.1)
Sodium: 138 mmol/L (ref 135–145)
Total Bilirubin: 0.4 mg/dL (ref 0.3–1.2)
Total Protein: 7.6 g/dL (ref 6.5–8.1)

## 2022-05-08 LAB — MRSA NEXT GEN BY PCR, NASAL: MRSA by PCR Next Gen: DETECTED — AB

## 2022-05-08 MED ORDER — ONDANSETRON HCL 4 MG PO TABS: 4.0000 mg | ORAL_TABLET | Freq: Four times a day (QID) | ORAL | Status: DC | PRN

## 2022-05-08 MED ORDER — ONDANSETRON HCL 4 MG/2ML IJ SOLN: 4.0000 mg | Freq: Four times a day (QID) | INTRAMUSCULAR | Status: DC | PRN

## 2022-05-08 MED ORDER — ACETAMINOPHEN 500 MG PO TABS
1000.0000 mg | ORAL_TABLET | Freq: Once | ORAL | Status: AC
  Administered 2022-05-08: 1000 mg via ORAL

## 2022-05-08 MED ORDER — TRAMADOL HCL 50 MG PO TABS
50.0000 mg | ORAL_TABLET | Freq: Four times a day (QID) | ORAL | Status: DC | PRN
  Administered 2022-05-08: 50 mg via ORAL
  Filled 2022-05-08: qty 1

## 2022-05-08 MED ORDER — SODIUM CHLORIDE 0.9 % IV SOLN
INTRAVENOUS | Status: DC
  Filled 2022-05-08 (×2): qty 1000

## 2022-05-08 MED ORDER — CHLORHEXIDINE GLUCONATE CLOTH 2 % EX PADS
6.0000 | MEDICATED_PAD | Freq: Every day | CUTANEOUS | Status: DC
  Administered 2022-05-09 – 2022-05-13 (×5): 6 via TOPICAL

## 2022-05-08 MED ORDER — SODIUM CHLORIDE 0.9 % IV SOLN: INTRAVENOUS | Status: DC

## 2022-05-08 MED ORDER — ACETAMINOPHEN 500 MG PO TABS
ORAL_TABLET | ORAL | Status: AC
  Filled 2022-05-08: qty 2

## 2022-05-08 MED ORDER — TRAMADOL HCL 50 MG PO TABS
50.0000 mg | ORAL_TABLET | ORAL | Status: DC | PRN
  Administered 2022-05-08: 50 mg via ORAL
  Administered 2022-05-09 – 2022-05-12 (×11): 100 mg via ORAL
  Administered 2022-05-13: 50 mg via ORAL
  Filled 2022-05-08 (×5): qty 2
  Filled 2022-05-08 (×2): qty 1
  Filled 2022-05-08 (×6): qty 2

## 2022-05-08 MED ORDER — ORAL CARE MOUTH RINSE: 15.0000 mL | OROMUCOSAL | Status: DC | PRN

## 2022-05-08 MED ORDER — ACETAMINOPHEN 325 MG PO TABS
650.0000 mg | ORAL_TABLET | ORAL | Status: DC | PRN
  Administered 2022-05-09 – 2022-05-12 (×7): 650 mg via ORAL
  Filled 2022-05-08 (×7): qty 2

## 2022-05-08 MED ORDER — LORAZEPAM 1 MG PO TABS
1.0000 mg | ORAL_TABLET | Freq: Once | ORAL | Status: AC
  Administered 2022-05-08: 1 mg via ORAL
  Filled 2022-05-08: qty 1

## 2022-05-08 MED ORDER — MUPIROCIN 2 % EX OINT
1.0000 | TOPICAL_OINTMENT | Freq: Two times a day (BID) | CUTANEOUS | Status: DC
  Administered 2022-05-08 – 2022-05-13 (×9): 1 via NASAL
  Filled 2022-05-08 (×2): qty 22

## 2022-05-08 NOTE — Plan of Care (Signed)

## 2022-05-08 NOTE — Plan of Care (Signed)
Problem: Education: Goal: Knowledge of General Education information will improve Description: Including pain rating scale, medication(s)/side effects and non-pharmacologic comfort measures 05/08/2022 1956 by Ancil Linsey, RN Outcome: Progressing 05/08/2022 1945 by Ancil Linsey, RN Outcome: Progressing   Problem: Health Behavior/Discharge Planning: Goal: Ability to manage health-related needs will improve 05/08/2022 1956 by Ancil Linsey, RN Outcome: Progressing 05/08/2022 1945 by Ancil Linsey, RN Outcome: Progressing   Problem: Clinical Measurements: Goal: Ability to maintain clinical measurements within normal limits will improve 05/08/2022 1956 by Ancil Linsey, RN Outcome: Progressing 05/08/2022 1945 by Ancil Linsey, RN Outcome: Progressing Goal: Will remain free from infection 05/08/2022 1956 by Ancil Linsey, RN Outcome: Progressing 05/08/2022 1945 by Ancil Linsey, RN Outcome: Progressing Goal: Diagnostic test results will improve 05/08/2022 1956 by Ancil Linsey, RN Outcome: Progressing 05/08/2022 1945 by Ancil Linsey, RN Outcome: Progressing Goal: Respiratory complications will improve 05/08/2022 1956 by Ancil Linsey, RN Outcome: Progressing 05/08/2022 1945 by Ancil Linsey, RN Outcome: Progressing Goal: Cardiovascular complication will be avoided 05/08/2022 1956 by Ancil Linsey, RN Outcome: Progressing 05/08/2022 1945 by Ancil Linsey, RN Outcome: Progressing   Problem: Activity: Goal: Risk for activity intolerance will decrease 05/08/2022 1956 by Ancil Linsey, RN Outcome: Progressing 05/08/2022 1945 by Ancil Linsey, RN Outcome: Progressing   Problem: Nutrition: Goal: Adequate nutrition will be maintained 05/08/2022 1956 by Ancil Linsey, RN Outcome: Progressing 05/08/2022 1945 by Ancil Linsey, RN Outcome: Progressing   Problem: Coping: Goal: Level of anxiety will decrease 05/08/2022 1956 by Ancil Linsey, RN Outcome: Progressing 05/08/2022 1945 by Ancil Linsey, RN Outcome: Progressing   Problem: Elimination: Goal: Will not experience complications related to bowel motility 05/08/2022 1956 by Ancil Linsey, RN Outcome: Progressing 05/08/2022 1945 by Ancil Linsey, RN Outcome: Progressing Goal: Will not experience complications related to urinary retention 05/08/2022 1956 by Ancil Linsey, RN Outcome: Progressing 05/08/2022 1945 by Ancil Linsey, RN Outcome: Progressing   Problem: Pain Managment: Goal: General experience of comfort will improve 05/08/2022 1956 by Ancil Linsey, RN Outcome: Progressing 05/08/2022 1945 by Ancil Linsey, RN Outcome: Progressing   Problem: Safety: Goal: Ability to remain free from injury will improve 05/08/2022 1956 by Ancil Linsey, RN Outcome: Progressing 05/08/2022 1945 by Ancil Linsey, RN Outcome: Progressing   Problem: Skin Integrity: Goal: Risk for impaired skin integrity will decrease 05/08/2022 1956 by Ancil Linsey, RN Outcome: Progressing 05/08/2022 1945 by Ancil Linsey, RN Outcome: Progressing   Problem: Education: Goal: Knowledge of the prescribed therapeutic regimen Outcome: Progressing Goal: Knowledge of disease or condition will improve Outcome: Progressing   Problem: Clinical Measurements: Goal: Neurologic status will improve Outcome: Progressing   Problem: Tissue Perfusion: Goal: Ability to maintain intracranial pressure will improve Outcome: Progressing   Problem: Respiratory: Goal: Will regain and/or maintain adequate ventilation Outcome: Progressing   Problem: Skin Integrity: Goal: Risk for impaired skin integrity will decrease Outcome: Progressing Goal: Demonstration of wound healing without infection will improve Outcome: Progressing   Problem: Psychosocial: Goal: Ability to verbalize positive feelings about self will improve Outcome: Progressing Goal: Ability to  participate in self-care as condition permits will improve Outcome: Progressing Goal: Ability to identify appropriate support needs will improve Outcome: Progressing   Problem: Health Behavior/Discharge Planning: Goal: Ability to manage health-related needs will improve Outcome: Progressing   Problem: Nutritional: Goal: Risk of aspiration will decrease Outcome: Progressing Goal: Dietary intake will improve Outcome:  Progressing   Problem: Communication: Goal: Ability to communicate needs accurately will improve Outcome: Progressing

## 2022-05-08 NOTE — H&P (Signed)
BP 135/86   Pulse 72   Temp 98.9 F (37.2 C) (Oral)   Resp 19   Ht 6' (1.829 m)   Wt 78 kg   SpO2 92%   BMI 23.32 kg/m  Cody Poole is a 27 y.o. male Who earlier today "got up too fast", lost consciousness and hit his head. Fellow inmate stated he thought Mr. Godshall was seizing. Complained of dizziness, headache, and neck pain. Head CT showed small amount of acute blood in the occipital region. Repeat CT at [facility] showed the hemorrhage to have increased in size. Transferred for observation. History of polysubstance abuse, paranoid schizophrenia . Has been incarcerated for a week. States he has "gotten up too fast" previously and lost consciousness.  Allergies  Allergen Reactions   Haldol [Haloperidol] Swelling    Tongue swelling  Dysarthria  Relieved with benadryl    Past Medical History:  Diagnosis Date   Anxiety    Back pain    Cervical spondylosis    Depression    ETOH abuse    Heroin abuse ([place])    Hypertension    IV drug abuse ([place])    Methamphetamine use ([place])    Opioid abuse with withdrawal ([place])    Paranoid schizophrenia ([place])    Polysubstance abuse ([place])    Subdural hematoma caused by concussion ([place]) 05/08/2022   Fall/Admit to [facility] for observation   Tobacco abuse   History reviewed. No pertinent surgical history. Family History  Family history unknown: Yes   Social History   Tobacco Use   Smoking status: Every Day   Smokeless tobacco: Never  Vaping Use   Vaping Use: Never used  Substance Use Topics   Alcohol use: Not Currently   Drug use: Yes    Types: IV, Cocaine, Amphetamines, Heroin, Marijuana, Methamphetamines   Physical Exam Constitutional:      General: He is not in acute distress.    Appearance: Normal appearance. He is normal weight. He is not ill-appearing, toxic-appearing or diaphoretic.  HENT:     Head: Normocephalic.     Right Ear: External ear normal.     Left Ear: External ear normal.     Nose: Nose normal.      Mouth/Throat:     Mouth: Mucous membranes are moist.     Pharynx: Oropharynx is clear.  Eyes:     Pupils: Pupils are equal, round, and reactive to light.  Cardiovascular:     Rate and Rhythm: Normal rate and regular rhythm.  Pulmonary:     Effort: Pulmonary effort is normal.  Abdominal:     General: Abdomen is flat.  Musculoskeletal:        General: Normal range of motion.     Cervical back: Normal range of motion.  Skin:    General: Skin is warm and dry.  Neurological:     General: No focal deficit present.     Mental Status: He is alert and oriented to person, place, and time. Mental status is at baseline.     GCS: GCS eye subscore is 4. GCS verbal subscore is 5. GCS motor subscore is 6.     Cranial Nerves: Cranial nerves 2-12 are intact. No cranial nerve deficit.     Sensory: Sensation is intact. No sensory deficit.     Motor: Motor function is intact. No weakness.     Coordination: Coordination is intact. Coordination normal.  Psychiatric:        Mood and Affect: Mood normal.  Behavior: Behavior normal.    CT Head Wo Contrast  Result Date: 05/08/2022 CLINICAL DATA:  Head trauma, abnormal mental status (Age 91-64y) repeat CT - f/u SDH from this morning. ? interval change EXAM: CT HEAD WITHOUT CONTRAST TECHNIQUE: Contiguous axial images were obtained from the base of the skull through the vertex without intravenous contrast. RADIATION DOSE REDUCTION: This exam was performed according to the departmental dose-optimization program which includes automated exposure control, adjustment of the mA and/or kV according to patient size and/or use of iterative reconstruction technique. COMPARISON:  Same day CT head. FINDINGS: Brain: Lobulated hyperdense acute hemorrhage along the left occipital convexity which extends inferiorly to involve the posterior fossa along the posterior aspect of left cerebellum. This hemorrhage measures up to 4.6 x 1.0 cm and is subjacent to the calvarial  fracture described below. Mild mass effect on the adjacent brain. No evidence of acute large vascular territory infarct, midline shift or hydrocephalus. Vascular: No hyperdense vessel. Skull: No acute fracture. Sinuses/Orbits: Clear sinuses.  No acute orbital findings. Other: No mastoid effusions. IMPRESSION: Interval increase in size of a now 1 cm thick extra-axial hemorrhage along the left occipital convexity. This hemorrhage now extends inferiorly into the posterior aspect of the posterior fossa and may be epidural in location. Findings discussed with Dr. Hyacinth Meeker via telephone at 3:35 p.m. Electronically Signed   By: Feliberto Harts M.D.   On: 05/08/2022 15:39   CT Head Wo Contrast  Result Date: 05/08/2022 CLINICAL DATA:  Polytrauma, blunt EXAM: CT HEAD WITHOUT CONTRAST TECHNIQUE: Contiguous axial images were obtained from the base of the skull through the vertex without intravenous contrast. RADIATION DOSE REDUCTION: This exam was performed according to the departmental dose-optimization program which includes automated exposure control, adjustment of the mA and/or kV according to patient size and/or use of iterative reconstruction technique. COMPARISON:  10/13/2020 FINDINGS: Brain: Small volume acute subdural hematoma overlying the left occipital lobe measuring up to 6 mm in maximal thickness (series 3, image 14). No midline shift. No evidence of acute infarction. No hydrocephalus. No intraventricular blood products. No mass lesions. Vascular: No hyperdense vessel or unexpected calcification. Skull: Normal. Negative for fracture or focal lesion. Sinuses/Orbits: No acute finding. Other: None. IMPRESSION: Small volume acute subdural hematoma overlying the left occipital lobe measuring up to 6 mm in maximal thickness. No midline shift. Critical Value/emergent results were called by telephone at the time of interpretation on 05/08/2022 at 10:05 am to provider [facility] , who verbally acknowledged these  results. Electronically Signed   By: Duanne Guess D.O.   On: 05/08/2022 10:05   CT Cervical Spine Wo Contrast  Result Date: 05/08/2022 CLINICAL DATA:  Poly trauma, blunt. Witnessed fall this morning after dizziness. Head injury. EXAM: CT CERVICAL SPINE WITHOUT CONTRAST TECHNIQUE: Multidetector CT imaging of the cervical spine was performed without intravenous contrast. Multiplanar CT image reconstructions were also generated. RADIATION DOSE REDUCTION: This exam was performed according to the departmental dose-optimization program which includes automated exposure control, adjustment of the mA and/or kV according to patient size and/or use of iterative reconstruction technique. COMPARISON:  CT cervical spine 10/13/2020. FINDINGS: Alignment: Stable mild straightening. No focal angulation or listhesis. Skull base and vertebrae: No evidence of acute fracture or traumatic subluxation. The cervical vertebra are unchanged in appearance. Soft tissues and spinal canal: No prevertebral fluid or swelling. No visible canal hematoma. Disc levels: Unchanged mild multilevel spondylosis with uncinate spurring. No high-grade spinal stenosis or nerve root encroachment identified. Upper chest: The lung  apices are clear. Other: None. IMPRESSION: 1. Stable cervical spine CT without evidence of acute fracture, traumatic subluxation or static signs of instability. 2. Unchanged mild multilevel cervical spondylosis. Electronically Signed   By: Richardean Sale M.D.   On: 05/08/2022 10:03    Admit for observation.  Exam normal at this time.  Will refrain from narcotics.

## 2022-05-08 NOTE — Progress Notes (Signed)
Verbal report given to receiving staff at [facility].

## 2022-05-08 NOTE — ED Provider Notes (Signed)
[facility] Provider Note   CSN: [id number] Arrival date & time: 05/08/22  0263     History  No chief complaint on file.   Cody Poole is a 27 y.o. male.  HPI   27 year old male, currently incarcerated, history of paranoid schizophrenia and polysubstance abuse.  Was in jail for the last week without Suboxone but taking all of his other medications including Zyprexa and Zoloft.  He reports that he was walking into the shower this morning when he fell backwards and struck his head, it is unclear whether he had a seizure but there was a fellow inmate who states that he saw him have seizure-like activity.  The security guard that is transporting the patient knows that he has been there for a week but does not know anything else about him.  The patient only complains of a headache neck pain and dizziness.  Denies chest pain shortness of breath coughing fevers nausea vomiting or diarrhea  Home Medications Prior to Admission medications   Medication Sig Start Date End Date Taking? Authorizing Provider  nicotine (NICODERM CQ - DOSED IN MG/24 HOURS) 21 mg/24hr patch Place 1 patch (21 mg total) onto the skin daily. 11/27/21   Dameron, Luna Fuse, DO  OLANZapine (ZYPREXA) 5 MG tablet Take 0.5 tablets (2.5 mg total) by mouth 2 (two) times daily. 11/26/21   Dameron, Luna Fuse, DO      Allergies    Haldol [haloperidol]    Review of Systems   Review of Systems  All other systems reviewed and are negative.   Physical Exam Updated Vital Signs There were no vitals taken for this visit. Physical Exam Vitals and nursing note reviewed.  Constitutional:      General: He is not in acute distress.    Appearance: He is well-developed.  HENT:     Head: Normocephalic.     Comments: Evidence of small amount of tongue biting to the tip of the tongue, there is a contusion to the posterior occiput at the crown    Mouth/Throat:     Pharynx: No oropharyngeal exudate.   Eyes:     General: No scleral icterus.       Right eye: No discharge.        Left eye: No discharge.     Conjunctiva/sclera: Conjunctivae normal.     Pupils: Pupils are equal, round, and reactive to light.  Neck:     Thyroid: No thyromegaly.     Vascular: No JVD.     Comments: Tenderness over the posterior cervical spine, cervical collar placed on arrival Cardiovascular:     Rate and Rhythm: Normal rate and regular rhythm.     Heart sounds: Normal heart sounds. No murmur heard.    No friction rub. No gallop.  Pulmonary:     Effort: Pulmonary effort is normal. No respiratory distress.     Breath sounds: Normal breath sounds. No wheezing or rales.  Abdominal:     General: Bowel sounds are normal. There is no distension.     Palpations: Abdomen is soft. There is no mass.     Tenderness: There is no abdominal tenderness.  Musculoskeletal:        General: No tenderness. Normal range of motion.     Right lower leg: No edema.     Left lower leg: No edema.  Lymphadenopathy:     Cervical: No cervical adenopathy.  Skin:    General: Skin is warm and dry.  Findings: No erythema or rash.     Comments: Prior track marks along the left arm, no signs of active infections or recent injections  Neurological:     General: No focal deficit present.     Mental Status: He is alert.     Coordination: Coordination normal.  Psychiatric:        Behavior: Behavior normal.     ED Results / Procedures / Treatments   Labs (all labs ordered are listed, but only abnormal results are displayed) Labs Reviewed - No data to display  EKG None  Radiology No results found.  Procedures .Critical Care  Performed by: Eber Hong, MD Authorized by: Eber Hong, MD   Critical care provider statement:    Critical care time (minutes):  30   Critical care time was exclusive of:  Separately billable procedures and treating other patients and teaching time   Critical care was necessary to treat or  prevent imminent or life-threatening deterioration of the following conditions:  CNS failure or compromise and trauma   Critical care was time spent personally by me on the following activities:  Development of treatment plan with patient or surrogate, discussions with consultants, evaluation of patient's response to treatment, examination of patient, ordering and review of laboratory studies, ordering and review of radiographic studies, ordering and performing treatments and interventions, pulse oximetry, re-evaluation of patient's condition, review of old charts and obtaining history from patient or surrogate   I assumed direction of critical care for this patient from another provider in my specialty: no     Care discussed with: admitting provider       Medications Ordered in ED Medications - No data to display  ED Course/ Medical Decision Making/ A&P Clinical Course as of 05/08/22 2119  Fri May 08, 2022  1046 I discussed the care with neurosurgeon Dr. Coletta Memos who recommends repeat CT scan at 3:00 PM to make sure there is no signs of changes.  Can be discharged back to jail if no changes [BM]    Clinical Course User Index [BM] Eber Hong, MD                             Medical Decision Making Amount and/or Complexity of Data Reviewed Labs: ordered. Radiology: ordered. ECG/medicine tests: ordered.  Risk OTC drugs. Prescription drug management. Decision regarding hospitalization.   This patient presents to the ED for concern of fall after having either a seizure or the fall prompting a seizure, this involves an extensive number of treatment options, and is a complaint that carries with it a high risk of complications and morbidity.  The differential diagnosis includes seizure disorder, alcohol withdrawal, substance abuse, electrolyte abnormality, intracranial hemorrhage, fracture, spinal injury   Co morbidities that complicate the patient evaluation  Polysubstance abuse,  schizophrenia   Additional history obtained:  Additional history obtained from security guard as well as medical records External records from outside source obtained and reviewed including multiple visits to the ER for polysubstance abuse   Lab Tests:  I Ordered, and personally interpreted labs.  The pertinent results include:  unremarkable   Imaging Studies ordered:  I ordered imaging studies including sub dural hematoma -   I independently visualized and interpreted imaging which showed SDH I agree with the radiologist interpretation   Cardiac Monitoring: / EKG:  The patient was maintained on a cardiac monitor.  I personally viewed and interpreted the cardiac monitored which  showed an underlying rhythm of: normal sinurs rhythm.   Consultations Obtained:  I requested consultation with the Neuro Surgeon - Dr Cyndy Freeze,  and discussed lab and imaging findings as well as pertinent plan - they recommend: observation and admission if the repeat CT shows growing area of blood.     Problem List / ED Course / Critical interventions / Medication management  Repeat CT showed that he had worsening bleeding and increased size of SDH - pt to be admitted by neurosurgery. Maintained normal neuro exam throughout the ED I have reviewed the patients home medicines and have made adjustments as needed   Social Determinants of Health:  Incarcerated   Test / Admission - Considered:  Admit and transfer to trauma center. Critical care provided         Final Clinical Impression(s) / ED Diagnoses Final diagnoses:  None  Sub Dural Hematoma Seizure Brain injury      Noemi Chapel, MD 05/08/22 2122

## 2022-05-08 NOTE — ED Provider Notes (Signed)
4:19 PM Discussed with Dr. Christella Noa. He feels that while it's larger it's unlikely to cause any problems but will admit the patient to [facility] overnight under his service. No seizure meds required at this time per him.   Sherwood Gambler, MD 05/08/22 [phone]

## 2022-05-08 NOTE — ED Triage Notes (Signed)
Patient had witnessed fall this morning after dizziness, hit back of the head, patient has hx of seizures.

## 2022-05-08 NOTE — Progress Notes (Signed)
Patient ID: Cody Poole, male   DOB: 05/20/1995, 27 y.o.   MRN: [id number] BP (!) 137/91   Pulse (!) 59   Temp 98.2 F (36.8 C) (Oral)   Resp 16   SpO2 100%  Patient to be transferred for overnight observation. The hematoma has grown in size, still absolutely no indication for operative evacuation.

## 2022-05-08 NOTE — Progress Notes (Signed)
Patient ID: Cody Poole, male   DOB: August 09, 1995, 27 y.o.   MRN: [id number] Films reviewed. Exceedingly low risk for this 5mm collection to evolve, or change. There is no skull fracture. Ok for repeat in six hours and if no change then can be discharged to the state.

## 2022-05-08 NOTE — ED Notes (Signed)
Patient returned from [place].

## 2022-05-08 NOTE — ED Notes (Signed)
Patient to CT.

## 2022-05-09 ENCOUNTER — Observation Stay (HOSPITAL_COMMUNITY): Payer: No Typology Code available for payment source

## 2022-05-09 DIAGNOSIS — S065XAA Traumatic subdural hemorrhage with loss of consciousness status unknown, initial encounter: Secondary | ICD-10-CM | POA: Diagnosis not present

## 2022-05-09 DIAGNOSIS — R569 Unspecified convulsions: Secondary | ICD-10-CM | POA: Diagnosis not present

## 2022-05-09 DIAGNOSIS — R4020 Unspecified coma: Secondary | ICD-10-CM

## 2022-05-09 MED ORDER — GADOBUTROL 1 MMOL/ML IV SOLN
7.5000 mL | Freq: Once | INTRAVENOUS | Status: AC | PRN
  Administered 2022-05-09: 7.5 mL via INTRAVENOUS

## 2022-05-09 MED ORDER — BUSPIRONE HCL 10 MG PO TABS
20.0000 mg | ORAL_TABLET | Freq: Three times a day (TID) | ORAL | Status: DC
  Administered 2022-05-09 – 2022-05-13 (×12): 20 mg via ORAL
  Filled 2022-05-09 (×12): qty 2

## 2022-05-09 MED ORDER — OLANZAPINE 2.5 MG PO TABS
15.0000 mg | ORAL_TABLET | Freq: Two times a day (BID) | ORAL | Status: DC
  Administered 2022-05-09 – 2022-05-13 (×9): 15 mg via ORAL
  Filled 2022-05-09: qty 1
  Filled 2022-05-09: qty 6
  Filled 2022-05-09 (×2): qty 1
  Filled 2022-05-09 (×4): qty 6
  Filled 2022-05-09: qty 1
  Filled 2022-05-09: qty 6

## 2022-05-09 MED ORDER — SERTRALINE HCL 100 MG PO TABS
100.0000 mg | ORAL_TABLET | Freq: Every day | ORAL | Status: DC
  Administered 2022-05-09 – 2022-05-13 (×5): 100 mg via ORAL
  Filled 2022-05-09 (×2): qty 1
  Filled 2022-05-09: qty 2
  Filled 2022-05-09: qty 1
  Filled 2022-05-09: qty 2

## 2022-05-09 NOTE — Consult Note (Signed)
Neurology Consultation    Reason for Consult: Syncopal episodes   CC: Headache and falling out spells  HISTORY OF PRESENT ILLNESS   HPI  History is obtained from: Patient, chart review  Cody Poole is a 27 y.o. male with a past medical history of paranoid schizophrenia, polysubstance abuse previously on suboxone, anxiety, HTN, and ETOH use. He presented to [facility] after a fall in the bathroom with reported seizure activity. CT shows a 1cm thick extra-axial hemorrhage along the left occipital convexity. States he was walking to the bathroom and he blacked out. He does endorse seizures when he is detoxing. When asked, he states that he did have a seizure yesterday. He has had approximately 4 total spells with the same symptoms over the past year. The only thing he remembers prior to "falling out" is feeling like he is going to faint after rising from a lying or seated position. The last time he detoxed was a couple of months ago and he had seizures at that time too. He endorses a posterior headache in the area he hit. The patient denies dizziness, visual changes, problems with swallowing or speech, focal muscle weakness, numbness or tingling of her extremities, abnormal movements, or other focal neurological deficits. Of note, the patient is a poor historian with poor insight, and obtaining information from him requires multiple focused questions.   ROS: Full ROS was performed and is negative except as noted in the HPI.   PAST MEDICAL HISTORY    Past Medical History:  Past Medical History:  Diagnosis Date   Anxiety    Back pain    Cervical spondylosis    Depression    ETOH abuse    Heroin abuse (HCC)    Hypertension    IV drug abuse (HCC)    Methamphetamine use (HCC)    Opioid abuse with withdrawal (HCC)    Paranoid schizophrenia (HCC)    Polysubstance abuse (HCC)    Subdural hematoma caused by concussion (HCC) 05/08/2022   Fall/Admit to [facility] for observation   Tobacco abuse      No family history on file. Family History  Family history unknown: Yes    Allergies:  Allergies  Allergen Reactions   Haldol [Haloperidol] Swelling    Tongue swelling  Dysarthria  Relieved with benadryl     Social History:   reports that he has been smoking. He has never used smokeless tobacco. He reports that he does not currently use alcohol. He reports current drug use. Drugs: IV, Cocaine, Amphetamines, Heroin, Marijuana, and Methamphetamines.    Medications Medications Prior to Admission  Medication Sig Dispense Refill   nicotine (NICODERM CQ - DOSED IN MG/24 HOURS) 21 mg/24hr patch Place 1 patch (21 mg total) onto the skin daily. 28 patch 0   OLANZapine (ZYPREXA) 5 MG tablet Take 0.5 tablets (2.5 mg total) by mouth 2 (two) times daily. 30 tablet 0    EXAMINATION    Current vital signs:    05/09/2022   10:00 AM 05/09/2022    9:00 AM 05/09/2022    8:00 AM  Vitals with BMI  Systolic 135 132 440  Diastolic 92 78 94  Pulse 71 76 82    Examination:  GENERAL: Awake, alert in NAD HEENT: - Normocephalic and atraumatic, dry mm, no lymphadenopathy, no Thyromegally LUNGS - Clear to auscultation bilaterally CV - S1S2 RRR, equal pulses bilaterally. ABDOMEN - Soft, nontender, nondistended with normoactive BS Ext: warm, well perfused, intact peripheral pulses, no pedal edema  NEURO:  Mental Status: AA&Ox3  Language: Speech is clear. Intact naming, repetition, and fluency.   Cranial Nerves:  II: PERRL. Visual fields full to confrontation.  III, IV, VI: EOM intact. Eyelids elevate symmetrically. Blinks to threat.  V: Facial sensation is symmetric VII: No facial asymmetry   VIII: Hearing intact to voice IX, X: Palate elevates symmetrically. Phonation is normal.  WY:OVZCHYIF shrug 5/5 and symmetrical  XII: Tongue is midline without fasciculations. Motor:  RUE: 5/5     LUE: 5/5 RLE: 5/5      LLE: 5/5  Tone is normal and bulk is normal DTRs: 2+ and symmetrical  throughout   Sensation- Intact to light touch and temperature bilaterally Coordination: FTN intact bilaterally, no ataxia in BLE., no abnormal movements  Gait- deferred   LABS   I have reviewed labs in epic and the results pertinent to this consultation are:  No results found for: "[facility]" Lab Results  Component Value Date   ALT 15 05/08/2022   AST 18 05/08/2022   ALKPHOS 70 05/08/2022   BILITOT 0.4 05/08/2022   No results found for: "HGBA1C" Lab Results  Component Value Date   WBC 9.1 05/08/2022   HGB 13.5 05/08/2022   HCT 43.2 05/08/2022   MCV 89.1 05/08/2022   PLT 357 05/08/2022   No results found for: "VITAMINB12" No results found for: "FOLATE" Lab Results  Component Value Date   NA 138 05/08/2022   K 4.3 05/08/2022   CL 106 05/08/2022   CO2 23 05/08/2022     DIAGNOSTIC IMAGING/PROCEDURES   I have reviewed the images obtained:, as below    CT Head- Small volume acute subdural hematoma overlying the left occipital lobe measuring up to 6 mm in maximal thickness. No midline shift.  Repeat CT Head- Interval increase in size of a now 1 cm thick extra-axial hemorrhage along the left occipital convexity. This hemorrhage now extends inferiorly into the posterior aspect of the posterior fossa and may be epidural in location.  EEG: This normal EEG is recorded in the waking and sleep state. There was no seizure or seizure predisposition recorded on this study. Please note that lack of epileptiform activity on EEG does not preclude the possibility of epilepsy.   MRI W WO: Pending   ASSESSMENT/PLAN    Assessment: 27 y.o. male with a past medical history of paranoid schizophrenia, polysubstance abuse previously on suboxone, anxiety, HTN, and ETOH use. He presented to [facility] after a fall in the shower with reported seizure activity. He endorses approximately 4 similar episodes in the past year or so, stating that all of the spells involve feeling like he is going to faint  after rising from a seated or lying position, and that he cannot remember what happens afterwards, but has been told by witnesses that he appears to be having a seizure after he falls down.  - Exam is nonfocal - Repeat CT head: Interval increase in size of a now 1 cm thick extra-axial hemorrhage along the left occipital convexity. This hemorrhage now extends inferiorly into the posterior aspect of the posterior fossa and may be epidural in location. - MRI brain performed at 1632: Preliminary review reveals essentially unchanged size of the subdural hematoma. No acute stroke is seen.  - EEG: Normal.  - If he indeed has had seizures after fainting, then convulsive syncope would be the most likely explanation for his presentation. If he has hit his head and had seizures immediately afterwards, then concussive convulsion would be  the best description. If the seizures occur first, resulting in falls, then primary epilepsy versus PNES would be the most likely components of the DDx.   Impression: Traumatic subdural hematoma with questionable seizure like activity.    Recommendations: - MRI brain has been completed with report pending.   - Orthostatic vitals  - Cardiac telemetry - Will need a full syncope work up. DDx includes cardiac etiology, neurogenic orthostatic hypotension, dehydration and malingering or factitious disorder.  - Frequent neuro checks - Inpatient seizure precautions - Ativan 2 mg IV PRN seizure - Given the negative spot EEG and no epileptogenic lesion seen on MRI other than the acute extra-axial bleed, will order LTM EEG.   Addendum: MRI brain:  1. 5.0 x 1.0 cm extra-axial hemorrhage overlying the posterior left cerebral convexity, relatively similar to previous. 2. Small hemorrhagic contusions involving the gyrus recti bilaterally. 3. Evolving posterior scalp contusion. 4. No other acute intracranial abnormality.  -- Patient seen and examined by NP/APP with MD.  Janine Ores, DNP, FNP-BC [facility] Pager: [phone]  I have seen and examined the patient. I have formulated the assessment and recommendations. 27 y.o. male with a past medical history of paranoid schizophrenia, polysubstance abuse previously on suboxone, anxiety, HTN, and ETOH use. He presented to [facility] after a fall in the shower with reported seizure activity. He endorses approximately 4 similar episodes in the past year or so, stating that all of the spells involve feeling like he is going to faint after rising from a seated or lying position, and that he cannot remember what happens afterwards, but has been told by witnesses that he appears to be having a seizure after he falls down. Exam is nonfocal. Repeat CT head reveals interval increase in size of a now 1 cm thick extra-axial hemorrhage along the left occipital convexity with DDx of epidural versus subdural hematoma. Recommendations as above.   Electronically signed: Dr. Kerney Elbe

## 2022-05-09 NOTE — Progress Notes (Signed)
EEG completed, results pending. 

## 2022-05-09 NOTE — Progress Notes (Signed)
SLP Cancellation Note  Patient Details Name: Cody Poole MRN: [id number] DOB: 1995/09/30   Cancelled treatment:       Reason Eval/Treat Not Completed: Other (comment) Pt with neurology presently. Will follow along for goals of care, appropriateness for cognitive linguistic evaluation.     [place], CCC-SLP Acute Rehabilitation Services   05/09/2022, 2:11 PM

## 2022-05-09 NOTE — Progress Notes (Signed)
PT Cancellation Note  Patient Details Name: Cody Poole MRN: [id number] DOB: 1995/08/30   Cancelled Treatment:    Reason Eval/Treat Not Completed: PT screened, no needs identified, will sign off.  Per RN has been mobilizing in the room well.  Pt is game, but the officer is not comfortable having pt out of the room, not in [name] there is no point mobilizing with restrictions of cuffs.  Will defer the evaluation unless notified otherwise. 05/09/2022  Ginger Carne., PT Acute Rehabilitation Services [phone]  (office)   [name] 05/09/2022, 10:30 AM

## 2022-05-09 NOTE — Procedures (Signed)
History: 27 yo M with loss of conciousness  Sedation: none  Technique: This EEG was acquired with electrodes placed according to the International 10-20 electrode system (including Fp1, Fp2, F3, F4, C3, C4, P3, P4, O1, O2, T3, T4, T5, T6, A1, A2, Fz, Cz, Pz). The following electrodes were missing or displaced: none.   Background: The background consists of intermixed alpha and beta activities. There is a well defined posterior dominant rhythm of 8-9 Hz that attenuates with eye opening. Sleep is recorded with normal appearing structures.   Photic stimulation: Physiologic driving is not performed  EEG Abnormalities: None  Clinical Interpretation: This normal EEG is recorded in the waking and sleep state. There was no seizure or seizure predisposition recorded on this study. Please note that lack of epileptiform activity on EEG does not preclude the possibility of epilepsy.   Cody Rack, MD [facility] [phone]  If 7pm- 7am, please page neurology on call as listed in [place].

## 2022-05-10 ENCOUNTER — Observation Stay (HOSPITAL_COMMUNITY): Payer: No Typology Code available for payment source

## 2022-05-10 MED ORDER — NALOXONE HCL 4 MG/0.1ML NA LIQD: 1.0000 | Freq: Once | NASAL | Status: DC

## 2022-05-10 MED ORDER — ALBUTEROL SULFATE (2.5 MG/3ML) 0.083% IN NEBU: 3.0000 mL | INHALATION_SOLUTION | Freq: Four times a day (QID) | RESPIRATORY_TRACT | Status: DC | PRN

## 2022-05-10 MED ORDER — NALOXONE HCL 0.4 MG/ML IJ SOLN: 0.4000 mg | INTRAMUSCULAR | Status: DC | PRN

## 2022-05-10 MED ORDER — BUPROPION HCL ER (SR) 100 MG PO TB12: 100.0000 mg | ORAL_TABLET | Freq: Every day | ORAL | Status: DC

## 2022-05-10 MED ORDER — BUPRENORPHINE HCL-NALOXONE HCL 8-2 MG SL SUBL
1.0000 | SUBLINGUAL_TABLET | Freq: Three times a day (TID) | SUBLINGUAL | Status: DC
  Administered 2022-05-10 – 2022-05-13 (×10): 1 via SUBLINGUAL
  Filled 2022-05-10 (×10): qty 1

## 2022-05-10 NOTE — Progress Notes (Signed)
LTM EEG hooked up and running with Non MRI leads. [facility] monitoring with test button tested.

## 2022-05-10 NOTE — Progress Notes (Signed)
Patient ID: Cody Poole, male   DOB: 08-26-1995, 27 y.o.   MRN: [id number] BP 118/70   Pulse (!) 58   Temp 97.9 F (36.6 C) (Oral)   Resp 14   Ht 6' (1.829 m)   Wt 78 kg   SpO2 91%   BMI 23.32 kg/m  Alert and oriented x4 Perrl, full eom Moving all extremities Awaiting rest of his workup There are no active neurourgical issues

## 2022-05-11 DIAGNOSIS — F419 Anxiety disorder, unspecified: Secondary | ICD-10-CM | POA: Diagnosis present

## 2022-05-11 DIAGNOSIS — F200 Paranoid schizophrenia: Secondary | ICD-10-CM | POA: Diagnosis present

## 2022-05-11 DIAGNOSIS — I10 Essential (primary) hypertension: Secondary | ICD-10-CM | POA: Diagnosis present

## 2022-05-11 DIAGNOSIS — S065XAA Traumatic subdural hemorrhage with loss of consciousness status unknown, initial encounter: Secondary | ICD-10-CM | POA: Diagnosis present

## 2022-05-11 DIAGNOSIS — R55 Syncope and collapse: Secondary | ICD-10-CM | POA: Diagnosis present

## 2022-05-11 DIAGNOSIS — F1010 Alcohol abuse, uncomplicated: Secondary | ICD-10-CM | POA: Diagnosis present

## 2022-05-11 DIAGNOSIS — W182XXA Fall in (into) shower or empty bathtub, initial encounter: Secondary | ICD-10-CM | POA: Diagnosis present

## 2022-05-11 DIAGNOSIS — Z888 Allergy status to other drugs, medicaments and biological substances status: Secondary | ICD-10-CM | POA: Diagnosis not present

## 2022-05-11 DIAGNOSIS — F1510 Other stimulant abuse, uncomplicated: Secondary | ICD-10-CM | POA: Diagnosis present

## 2022-05-11 DIAGNOSIS — F1210 Cannabis abuse, uncomplicated: Secondary | ICD-10-CM | POA: Diagnosis present

## 2022-05-11 DIAGNOSIS — S064XAA Epidural hemorrhage with loss of consciousness status unknown, initial encounter: Secondary | ICD-10-CM | POA: Diagnosis present

## 2022-05-11 DIAGNOSIS — M542 Cervicalgia: Secondary | ICD-10-CM | POA: Diagnosis present

## 2022-05-11 DIAGNOSIS — F1410 Cocaine abuse, uncomplicated: Secondary | ICD-10-CM | POA: Diagnosis present

## 2022-05-11 DIAGNOSIS — F32A Depression, unspecified: Secondary | ICD-10-CM | POA: Diagnosis present

## 2022-05-11 DIAGNOSIS — S0003XA Contusion of scalp, initial encounter: Secondary | ICD-10-CM | POA: Diagnosis present

## 2022-05-11 DIAGNOSIS — R569 Unspecified convulsions: Secondary | ICD-10-CM | POA: Diagnosis not present

## 2022-05-11 DIAGNOSIS — Z79899 Other long term (current) drug therapy: Secondary | ICD-10-CM | POA: Diagnosis not present

## 2022-05-11 DIAGNOSIS — F1910 Other psychoactive substance abuse, uncomplicated: Secondary | ICD-10-CM | POA: Diagnosis present

## 2022-05-11 DIAGNOSIS — F17200 Nicotine dependence, unspecified, uncomplicated: Secondary | ICD-10-CM | POA: Diagnosis present

## 2022-05-11 DIAGNOSIS — Y9289 Other specified places as the place of occurrence of the external cause: Secondary | ICD-10-CM | POA: Diagnosis not present

## 2022-05-11 NOTE — Procedures (Signed)
Patient Name: Cody Poole  MRN: [id number]  Epilepsy Attending: Lora Havens  Referring Physician/Provider: Kerney Elbe, MD  Duration: 05/10/2022 1054 to 05/11/2022 1056  Patient history: 27 year old male with seizure-like activity.  EEG to evaluate for seizure.  Level of alertness: Awake, asleep  AEDs during EEG study: None  Technical aspects: This EEG study was done with scalp electrodes positioned according to the 10-20 International system of electrode placement. Electrical activity was reviewed with band pass filter of 1-70Hz , sensitivity of 7 uV/mm, display speed of 39mm/sec with a 60Hz  notched filter applied as appropriate. EEG data were recorded continuously and digitally stored.  Video monitoring was available and reviewed as appropriate.  Description: The posterior dominant rhythm consists of 8-9 Hz activity of moderate voltage (25-35 uV) seen predominantly in posterior head regions, symmetric and reactive to eye opening and eye closing. Sleep was characterized by vertex waves, sleep spindles (12 to 14 Hz), maximal frontocentral region. Hyperventilation and photic stimulation were not performed.     Off note, EEG was disconnected on 05/10/2022 between 1817 to 2218.  IMPRESSION: This study is within normal limits. No seizures or epileptiform discharges were seen throughout the recording.  A normal interictal EEG does not exclude the diagnosis of epilepsy.  Kellyjo Edgren Barbra Sarks

## 2022-05-11 NOTE — Progress Notes (Signed)
Patient ID: Cody Poole, male   DOB: Oct 04, 1995, 27 y.o.   MRN: [id number] BP 135/79 (BP Location: Left Arm)   Pulse 87   Temp 98.2 F (36.8 C)   Resp 16   Ht 6' (1.829 m)   Wt 78 kg   SpO2 97%   BMI 23.32 kg/m  Unless otherwise informed by neurology will discharge tomorrow.  Alert and oriented x 4 Speech is clear and fluent Moving all extremities

## 2022-05-11 NOTE — Progress Notes (Signed)
  Transition of Care [facility]) Screening Note   Patient Details  Name: Cody Poole Date of Birth: Oct 08, 1995   Transition of [facility]) CM/SW Contact:    Pollie Friar, RN Phone Number: 05/11/2022, 1:48 PM   [facility] is from [facility]. Plan is for him to return at d/c .  Transition of Care Department [facility]) has reviewed patient. We will continue to monitor patient advancement through interdisciplinary progression rounds. If new patient transition needs arise, please place a TOC consult.

## 2022-05-11 NOTE — Progress Notes (Signed)
LTM EEG D/C'd. No skin breakdown noted. [facility] was notified.

## 2022-05-11 NOTE — Progress Notes (Signed)
SLP Cancellation Note  Patient Details Name: Cody Poole MRN: [id number] DOB: 21-Jul-1995   Cancelled treatment:       Reason Eval/Treat Not Completed: SLP screened, no needs identified, will sign off   Juan Quam Laurice 05/11/2022, 10:23 AM

## 2022-05-11 NOTE — Progress Notes (Signed)
Neurology Progress Note  Brief HPI: 27 year old patient with history of schizophrenia, substance abuse on Suboxone previously, anxiety hypertension and EtOH use presented from jail after falling while going to the bathroom with seizure activity reported after the fall.  Stated that he felt dizzy and faint prior to the episode and that he felt normal immediately afterwards.  He was found to have a subdural hematoma on the left side.  He has had no seizure activity since admission, but has had several of these episodes in the past, usually after standing up quickly.  Subjective: Patient reports that he is feeling well today and wishes that he could restart his Suboxone.  Exam: Vitals:   05/11/22 0403 05/11/22 0845  BP: 130/79 120/79  Pulse: 94 81  Resp: 20 16  Temp: 97.9 F (36.6 C) 97.8 F (36.6 C)  SpO2: 98% 99%   Gen: In bed, NAD Resp: non-labored breathing, no acute distress  Neuro: Mental Status: Alert and oriented to person place time and situation, able to follow one-step and two-step commands without difficulty Cranial Nerves: Pupils equal round and reactive to light, extraocular movements intact, facial sensation symmetrical, face symmetrical, hearing intact to voice, phonation normal, shoulder shrug symmetrical, tongue midline Motor: Able to move all 4 extremities with good antigravity strength Sensory: Intact to light touch throughout DTR: Unable to elicit Gait: Deferred  Pertinent Labs:    Latest Ref Rng & Units 05/08/2022    9:20 AM 12/22/2021    5:35 PM 11/25/2021    3:31 AM  CBC  WBC 4.0 - 10.5 K/uL 9.1  7.3  7.4   Hemoglobin 13.0 - 17.0 g/dL 13.5  11.8  12.9   Hematocrit 39.0 - 52.0 % 43.2  36.3  38.9   Platelets 150 - 400 K/uL 357  299  353        Latest Ref Rng & Units 05/08/2022    9:20 AM 12/22/2021    5:35 PM 11/26/2021    1:30 AM  BMP  Glucose 70 - 99 mg/dL 152  109  95   BUN 6 - 20 mg/dL 11  16  11    Creatinine 0.61 - 1.24 mg/dL 0.74  0.71  0.67    Sodium 135 - 145 mmol/L 138  138  139   Potassium 3.5 - 5.1 mmol/L 4.3  4.1  3.6   Chloride 98 - 111 mmol/L 106  106  111   CO2 22 - 32 mmol/L 23  29  23    Calcium 8.9 - 10.3 mg/dL 9.4  9.0  7.9      Imaging Reviewed:  CT head: 1 cm extra-axial hemorrhage along left occipital convexity  MRI brain: Stable 5 x 1 cm extra-axial hemorrhage over posterior left cerebral convexity, small hemorrhagic contusions involving gyrus recti   Assessment: 27 year old patient with above past medical history presented after a fall with convulsive activity seen afterwards.  He was found to have a left-sided subdural hematoma, which has been stable on imaging.  LTM EEG has been negative for seizure activity.  [place] includes seizure primary seizure activity, syncopal convulsion  Impression: Traumatic subdural hematoma with possible seizure activity  Recommendations: 1) Discontinue long-term EEG as no seizure activity seen 2) Will need full syncopal workup 3) 2 mg Ativan IV for seizure activity 4) continue seizure precautions 5) primary team to discuss driving restrictions with patient  [place] , MSN, AGACNP-BC [facility] See Amion for schedule and pager information 05/11/2022 10:06 AM  NEUROHOSPITALIST ADDENDUM Performed a face to face diagnostic evaluation.   I have reviewed the contents of history and physical exam as documented by PA/ARNP/Resident and agree with above documentation.  I have discussed and formulated the above plan as documented. Edits to the note have been made as needed.  Impression/Key exam findings/Plan: clinical description of the episode is not concerning for a seizure. With little to no post ictal period, suspect this is was likely convulsive syncope. Low overall suspicion for seizures specially with negative LTM EEG.  LTM discontinued. Discussed my impression with patient. We will signoff. Please feel free to contact us with any questions  or concerns.  Donnetta Simpers, MD [facility] [phone]   If 7pm to 7am, please call on call as listed on AMION.

## 2022-05-12 NOTE — Progress Notes (Signed)
   05/12/22 0951  Spiritual Encounters  Type of Visit Initial  Care provided to: Patient  Conversation partners present during encounter Nurse  Referral source Other (comment) (Pt requested a Bible)  Reason for visit Routine spiritual support  Spiritual Framework  Patient Stress Factors Financial concerns (Drug withdrawl)  Goals  Self/Personal Goals Pt expressed desire to attend rehab  Additional Comment(s) Pt expressed desire to get clean and get his dog back  Interventions  Spiritual Care Interventions Made Established relationship of care and support;Prayer;Encouragement  Intervention Outcomes  Outcomes Connection to spiritual care   Pt requested Bible. In conversation he asked for prayer about an upcoming court date as well as the desire to get into a certain rehab.  Prayed with Pt and offered encouragement regarding his future uncertainties.  Chaplain remains available when needed.

## 2022-05-12 NOTE — Progress Notes (Signed)
Patient ID: Cody Poole, male   DOB: 10/31/1995, 27 y.o.   MRN: [id number] BP (!) 146/95 (BP Location: Left Arm)   Pulse (!) 103   Temp 98.2 F (36.8 C)   Resp 16   Ht 6' (1.829 m)   Wt 78 kg   SpO2 98%   BMI 23.32 kg/m  Will discharge tomorrow. Alert and oriented x 4 Moving all extremities

## 2022-05-13 NOTE — Progress Notes (Signed)
VeRN reviewed AVS with pt. VeRN answered the pt questions. Pt at this time had no more questions. He stated that  he understood the discharge information.

## 2022-05-13 NOTE — Plan of Care (Signed)
Pt is discharged back to his facilility. PIV removed. All personal belongings returned. Accompanied by an Garment/textile technologist at discharge.   Problem: Education: Goal: Knowledge of General Education information will improve Description: Including pain rating scale, medication(s)/side effects and non-pharmacologic comfort measures Outcome: Completed/Met   Problem: Health Behavior/Discharge Planning: Goal: Ability to manage health-related needs will improve Outcome: Completed/Met   Problem: Clinical Measurements: Goal: Ability to maintain clinical measurements within normal limits will improve Outcome: Completed/Met Goal: Will remain free from infection Outcome: Completed/Met Goal: Diagnostic test results will improve Outcome: Completed/Met Goal: Respiratory complications will improve Outcome: Completed/Met Goal: Cardiovascular complication will be avoided Outcome: Completed/Met   Problem: Activity: Goal: Risk for activity intolerance will decrease Outcome: Completed/Met   Problem: Nutrition: Goal: Adequate nutrition will be maintained Outcome: Completed/Met   Problem: Coping: Goal: Level of anxiety will decrease Outcome: Completed/Met   Problem: Elimination: Goal: Will not experience complications related to bowel motility Outcome: Completed/Met Goal: Will not experience complications related to urinary retention Outcome: Completed/Met   Problem: Pain Managment: Goal: General experience of comfort will improve Outcome: Completed/Met   Problem: Safety: Goal: Ability to remain free from injury will improve Outcome: Completed/Met   Problem: Skin Integrity: Goal: Risk for impaired skin integrity will decrease Outcome: Completed/Met   Problem: Education: Goal: Knowledge of the prescribed therapeutic regimen Outcome: Completed/Met Goal: Knowledge of disease or condition will improve Outcome: Completed/Met   Problem: Clinical Measurements: Goal: Neurologic status will  improve Outcome: Completed/Met   Problem: Tissue Perfusion: Goal: Ability to maintain intracranial pressure will improve Outcome: Completed/Met   Problem: Respiratory: Goal: Will regain and/or maintain adequate ventilation Outcome: Completed/Met   Problem: Skin Integrity: Goal: Risk for impaired skin integrity will decrease Outcome: Completed/Met Goal: Demonstration of wound healing without infection will improve Outcome: Completed/Met   Problem: Psychosocial: Goal: Ability to verbalize positive feelings about self will improve Outcome: Completed/Met Goal: Ability to participate in self-care as condition permits will improve Outcome: Completed/Met Goal: Ability to identify appropriate support needs will improve Outcome: Completed/Met   Problem: Health Behavior/Discharge Planning: Goal: Ability to manage health-related needs will improve Outcome: Completed/Met   Problem: Nutritional: Goal: Risk of aspiration will decrease Outcome: Completed/Met Goal: Dietary intake will improve Outcome: Completed/Met   Problem: Communication: Goal: Ability to communicate needs accurately will improve Outcome: Completed/Met

## 2022-05-13 NOTE — Discharge Summary (Signed)
  Physician Discharge Summary  Patient ID: Cody Poole MRN: [id number] DOB/AGE: 01-02-1996 27 y.o.  Admit date: 05/08/2022 Discharge date: 05/13/2022  Admission Diagnoses:traumatic subdural hematoma  Discharge Diagnoses: same Principal Problem:   Subdural hematoma ([place]) Active Problems:   Intracranial epidural hematoma [facility])   Discharged Condition: good  Hospital Course: [name] was admitted for a traumatic subdural hematoma which was very small causing little to no mass effect. Repeat CT scan showed minimal enlargement of the hematoma so he was admitted. I asked Neurology to consult due to the circumstances of the fall. Full syncope workup, seizure workup, were negative. He is discharged with a normal neurological exam. No follow up is needed.  Treatments: procedures: EEG  Discharge Exam: Blood pressure (!) 148/92, pulse 88, temperature 97.7 F (36.5 C), temperature source Oral, resp. rate 15, height 6' (1.829 m), weight 78 kg, SpO2 95 %. General appearance: alert, cooperative, appears stated age, and no distress  Disposition: Discharge disposition: [place] Not Defined      * No surgery found *  Allergies as of 05/13/2022       Reactions   Haldol [haloperidol] Swelling   Tongue swelling  Dysarthria  Relieved with benadryl         Medication List     TAKE these medications    albuterol 108 (90 Base) MCG/ACT inhaler Commonly known as: VENTOLIN HFA Inhale 1-2 puffs into the lungs every 6 (six) hours as needed for wheezing or shortness of breath.   buprenorphine-naloxone 8-2 mg Subl SL tablet Commonly known as: SUBOXONE Place 1 tablet under the tongue 3 (three) times daily.   buPROPion ER 100 MG 12 hr tablet Commonly known as: WELLBUTRIN SR Take 100 mg by mouth daily.   busPIRone 10 MG tablet Commonly known as: BUSPAR Take 20 mg by mouth 3 (three) times daily.   naloxone 4 MG/0.1ML Liqd nasal spray kit Commonly known as:  NARCAN Place 1 spray into the nose once. For opiate overdose   nicotine 21 mg/24hr patch Commonly known as: NICODERM CQ - dosed in mg/24 hours Place 1 patch (21 mg total) onto the skin daily.   OLANZapine 10 MG tablet Commonly known as: ZYPREXA Take 15 mg by mouth 2 (two) times daily.   sertraline 100 MG tablet Commonly known as: ZOLOFT Take 100 mg by mouth daily.         SignedAshok Pall 05/13/2022, 10:58 AM

## 2022-05-13 NOTE — TOC Transition Note (Signed)
Transition of Care [facility]) - CM/SW Discharge Note   Patient Details  Name: Cody Poole MRN: [id number] Date of Birth: 1995-12-10  Transition of Care [facility]) CM/SW Contact:  Pollie Friar, RN Phone Number: 05/13/2022, 12:31 PM   Clinical Narrative:    [facility]. The guards at the bedside will arrange transportation back to the facility.  No changes to pts medications.   Final next level of care: [facility]rections Facility Barriers to Discharge: No Barriers Identified   Patient Goals and CMS Choice      Discharge Placement                         Discharge Plan and Services Additional resources added to the After Visit Summary for                                       Social Determinants of Health (SDOH) Interventions SDOH Screenings   Food Insecurity: Food Insecurity Present (05/08/2022)  Housing: High Risk (05/08/2022)  Transportation Needs: Unmet Transportation Needs (05/08/2022)  Utilities: At Risk (05/08/2022)  Alcohol Screen: Low Risk  (05/11/2021)  Tobacco Use: High Risk (05/08/2022)     Readmission Risk Interventions     No data to display

## 2022-09-10 IMAGING — CR DG ABDOMEN 1V
2 series · 2 of 2 positions shown · non-contrast
Comparison: 10/13/2020

CLINICAL DATA: Abdominal pain for the last few hours in the left
upper quadrant.

EXAM:
ABDOMEN - 1 VIEW

[abdomen kub (1 of 2)]
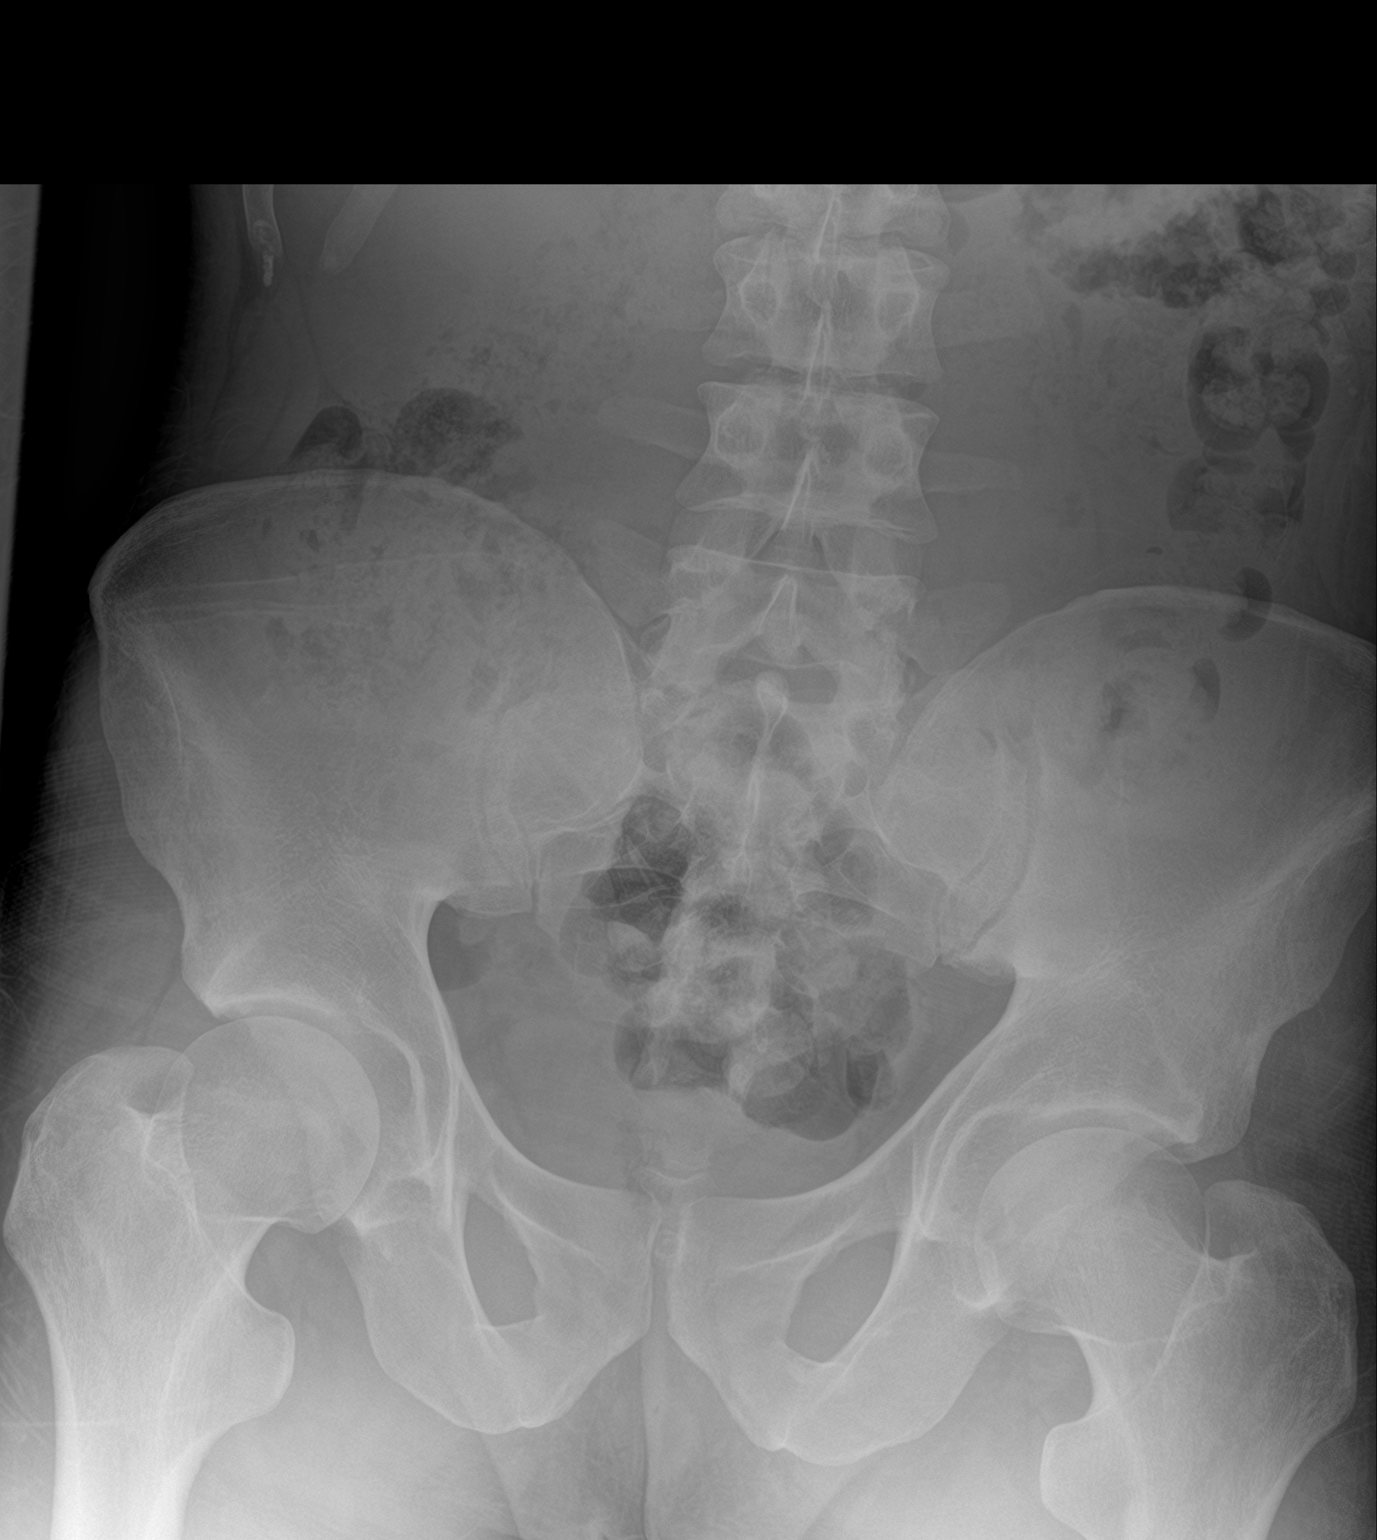

[abdomen kub (2 of 2)]
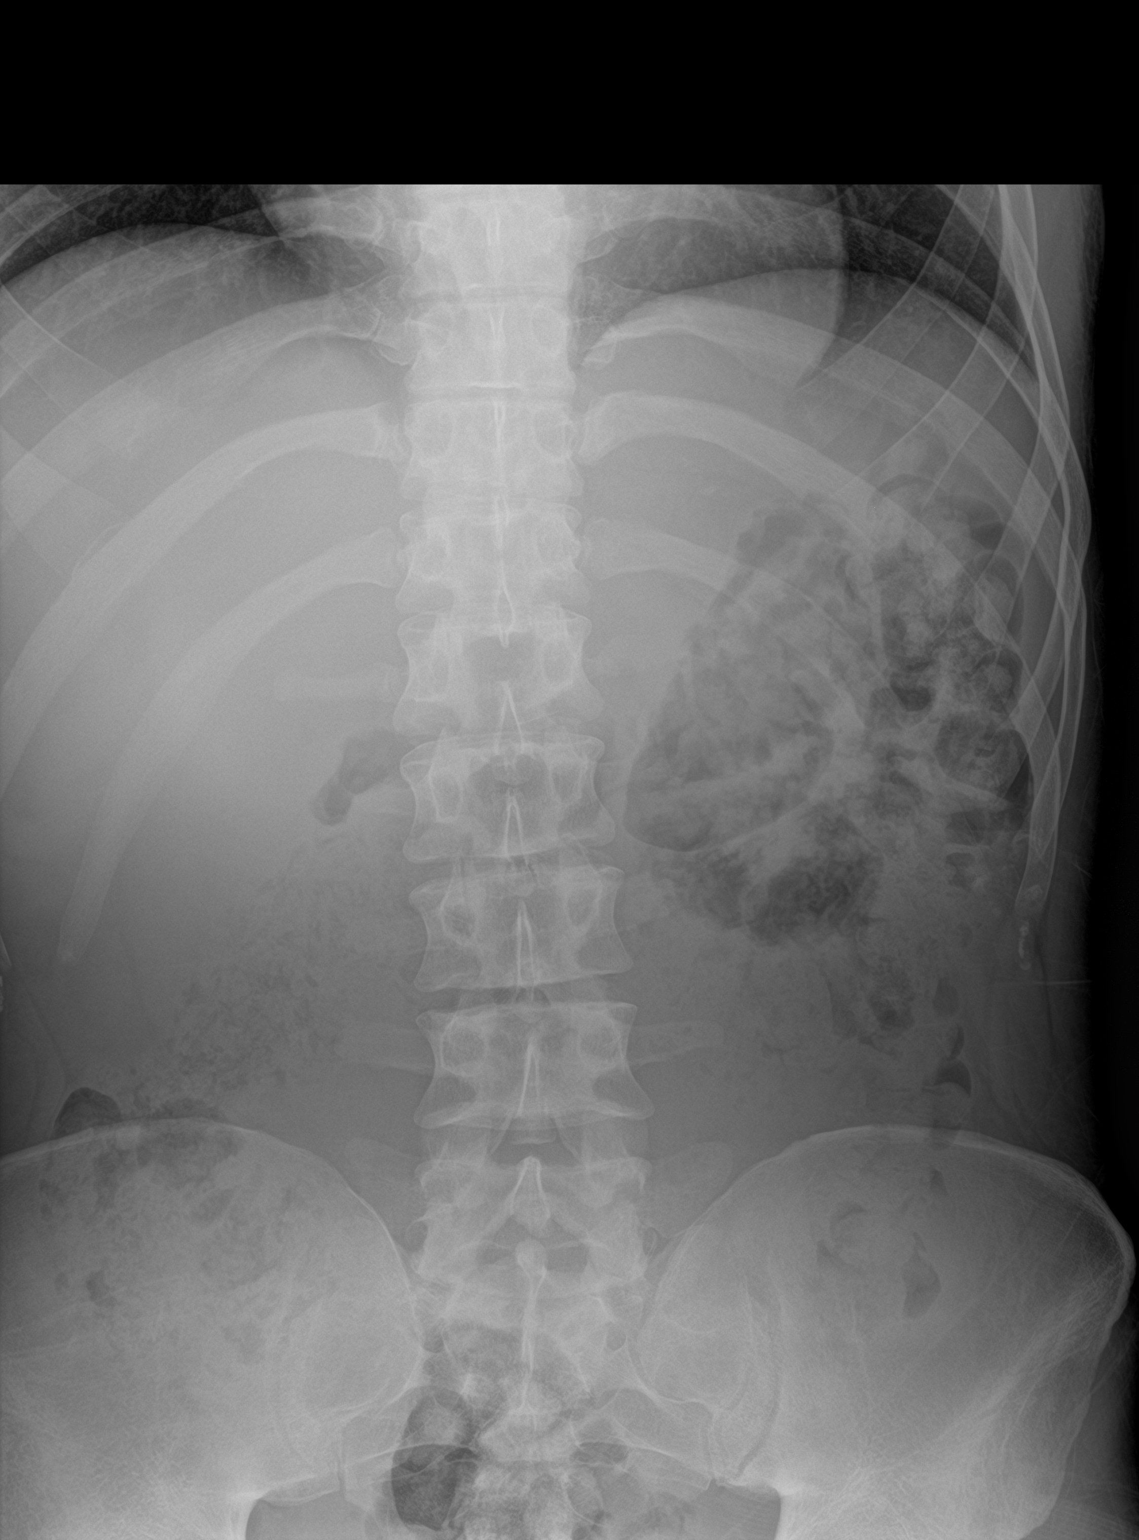

[2 of 2 positions shown; findings below may reference images not displayed]

FINDINGS: Upper normal amount of formed stool in the colon with superimposed
loops of stool filled colon and small bowel in the left upper
quadrant, but without visible dilated small bowel. No significant
abnormal calcifications are observed.
IMPRESSION: 1. Borderline prominence of stool in the colon, potentially from
constipation. No dilated small bowel is identified.

## 2023-05-19 ENCOUNTER — Other Ambulatory Visit: Payer: Self-pay

## 2023-05-19 ENCOUNTER — Emergency Department (HOSPITAL_COMMUNITY)
Admission: EM | Admit: 2023-05-19 | Discharge: 2023-05-19 | Disposition: A | Discharge status: Home / Self Care | Payer: Medicaid Other | Attending: Emergency Medicine | Admitting: Emergency Medicine

## 2023-05-19 DIAGNOSIS — Z5900 Homelessness unspecified: Secondary | ICD-10-CM | POA: Insufficient documentation

## 2023-05-19 DIAGNOSIS — R600 Localized edema: Secondary | ICD-10-CM | POA: Diagnosis not present

## 2023-05-19 DIAGNOSIS — F1910 Other psychoactive substance abuse, uncomplicated: Secondary | ICD-10-CM | POA: Insufficient documentation

## 2023-05-19 DIAGNOSIS — Y900 Blood alcohol level of less than 20 mg/100 ml: Secondary | ICD-10-CM | POA: Diagnosis not present

## 2023-05-19 DIAGNOSIS — M7989 Other specified soft tissue disorders: Secondary | ICD-10-CM | POA: Diagnosis present

## 2023-05-19 DIAGNOSIS — R7989 Other specified abnormal findings of blood chemistry: Secondary | ICD-10-CM | POA: Insufficient documentation

## 2023-05-19 LAB — COMPREHENSIVE METABOLIC PANEL
ALT: 49 U/L — ABNORMAL HIGH (ref 0–44)
AST: 108 U/L — ABNORMAL HIGH (ref 15–41)
Albumin: 4.1 g/dL (ref 3.5–5.0)
Alkaline Phosphatase: 78 U/L (ref 38–126)
Anion gap: 10 (ref 5–15)
BUN: 10 mg/dL (ref 6–20)
CO2: 26 mmol/L (ref 22–32)
Calcium: 9.3 mg/dL (ref 8.9–10.3)
Chloride: 101 mmol/L (ref 98–111)
Creatinine, Ser: 0.84 mg/dL (ref 0.61–1.24)
GFR, Estimated: >60 mL/min (ref >60)
Glucose, Bld: 111 mg/dL — ABNORMAL HIGH (ref 70–99)
Potassium: 3.5 mmol/L (ref 3.5–5.1)
Sodium: 137 mmol/L (ref 135–145)
Total Bilirubin: 1.2 mg/dL (ref 0.0–1.2)
Total Protein: 6.8 g/dL (ref 6.5–8.1)

## 2023-05-19 LAB — CBC WITH DIFFERENTIAL/PLATELET
Abs Immature Granulocytes: 0.01 10*3/uL (ref 0.00–0.07)
Basophils Absolute: 0 10*3/uL (ref 0.0–0.1)
Basophils Relative: 1 %
Eosinophils Absolute: 0.1 10*3/uL (ref 0.0–0.5)
Eosinophils Relative: 1 %
HCT: 39.2 % (ref 39.0–52.0)
Hemoglobin: 13 g/dL (ref 13.0–17.0)
Immature Granulocytes: 0 %
Lymphocytes Relative: 16 %
Lymphs Abs: 1.1 10*3/uL (ref 0.7–4.0)
MCH: 29.1 pg (ref 26.0–34.0)
MCHC: 33.2 g/dL (ref 30.0–36.0)
MCV: 87.7 fL (ref 80.0–100.0)
Monocytes Absolute: 0.6 10*3/uL (ref 0.1–1.0)
Monocytes Relative: 8 %
Neutro Abs: 5.3 10*3/uL (ref 1.7–7.7)
Neutrophils Relative %: 74 %
Platelets: 236 10*3/uL (ref 150–400)
RBC: 4.47 MIL/uL (ref 4.22–5.81)
RDW: 12.7 % (ref 11.5–15.5)
WBC: 7.1 10*3/uL (ref 4.0–10.5)
nRBC: 0 % (ref 0.0–0.2)

## 2023-05-19 LAB — ETHANOL: Alcohol, Ethyl (B): <10 mg/dL (ref <10)

## 2023-05-19 NOTE — ED Notes (Signed)
 No answer in lobby.

## 2023-05-19 NOTE — ED Notes (Signed)
 Pt. Given lunch bag and drinks.

## 2023-05-19 NOTE — ED Notes (Signed)
 No answer in lobby for fast track area

## 2023-05-19 NOTE — ED Provider Notes (Signed)
 [place] EMERGENCY DEPARTMENT AT [facility] Provider Note   CSN: [id number] Arrival date & time: 05/19/23  0700     History  Chief Complaint  Patient presents with   Leg Swelling    Cody Poole is a 28 y.o. male.  With a history of polysubstance use and homelessness who presents to the ED for leg swelling.  Patient reports swelling bilateral lower extremities for last 24 hours.  He has had periods of prolonged standing and has been walking as he is currently homeless.  Recently released from prison and is on probation.  He is currently on Suboxone .  Denies heroin use but reports using IV methamphetamine and K2.  He does not want help for substance use at this time.  Denies HI SI.  No other systemic complaints at this time  HPI     Home Medications Prior to Admission medications   Medication Sig Start Date End Date Taking? Authorizing Provider  albuterol  (VENTOLIN  HFA) 108 (90 Base) MCG/ACT inhaler Inhale 1-2 puffs into the lungs every 6 (six) hours as needed for wheezing or shortness of breath. 03/25/22   [provider]  buprenorphine -naloxone  (SUBOXONE ) 8-2 mg SUBL SL tablet Place 1 tablet under the tongue 3 (three) times daily. 04/24/22   [provider]  buPROPion  ER (WELLBUTRIN  SR) 100 MG 12 hr tablet Take 100 mg by mouth daily. 04/24/22   [provider]  busPIRone  (BUSPAR ) 10 MG tablet Take 20 mg by mouth 3 (three) times daily. 04/24/22   [provider]  naloxone  (NARCAN ) nasal spray 4 mg/0.1 mL Place 1 spray into the nose once. For opiate overdose 04/30/22   [provider]  nicotine  (NICODERM CQ  - DOSED IN MG/24 HOURS) 21 mg/24hr patch Place 1 patch (21 mg total) onto the skin daily. 11/27/21   Dameron, Marisa, DO  OLANZapine  (ZYPREXA ) 10 MG tablet Take 15 mg by mouth 2 (two) times daily. 04/24/22   [provider]  sertraline  (ZOLOFT ) 100 MG tablet Take 100 mg by mouth daily. 04/24/22   [provider]       Allergies    Haldol  [haloperidol ]    Review of Systems   Review of Systems  Physical Exam Updated Vital Signs BP (!) 146/99 (BP Location: Left Arm)   Pulse (!) 120   Temp 97.9 F (36.6 C) (Oral)   Resp 16   Ht 6' 2 (1.88 m)   Wt 81.6 kg   SpO2 99%   BMI 23.11 kg/m  Physical Exam Vitals and nursing note reviewed.  HENT:     Head: Normocephalic and atraumatic.  Eyes:     Pupils: Pupils are equal, round, and reactive to light.  Cardiovascular:     Rate and Rhythm: Normal rate and regular rhythm.  Pulmonary:     Effort: Pulmonary effort is normal.     Breath sounds: Normal breath sounds.  Abdominal:     Palpations: Abdomen is soft.     Tenderness: There is no abdominal tenderness.  Musculoskeletal:     Comments: Trace peripheral edema bilateral lower extremities without tenderness erythema or increased warmth  Skin:    General: Skin is warm and dry.     Comments: Track marks over bilateral forearms without evidence of erythema induration or drainage  Neurological:     Mental Status: He is alert.  Psychiatric:        Mood and Affect: Mood normal.     ED Results / Procedures / Treatments  Labs (all labs ordered are listed, but only abnormal results are displayed) Labs Reviewed  COMPREHENSIVE METABOLIC PANEL - Abnormal; Notable for the following components:      Result Value   Glucose, Bld 111 (*)    AST 108 (*)    ALT 49 (*)    All other components within normal limits  CBC WITH DIFFERENTIAL/PLATELET  ETHANOL    EKG None  Radiology No results found.  Procedures Procedures    Medications Ordered in ED Medications - No data to display  ED Course/ Medical Decision Making/ A&P                                 Medical Decision Making 28 year old male with history as above presenting for bilateral lower extremity edema.  Exam looks like peripheral edema probably from prolonged standing and walking.  No cellulitis or evidence of acute infection.  He  has track marks over both forearms but no evidence of acute infection at the sites.  Labs look okay and LFTs are slightly elevated but down from recent blood draws.  Denies SI HI and has psychiatric medications including Abilify .  He would be appropriate for mental health outpatient follow-up and I provided him with list of resources.  He plans to try to check into a local homeless shelter directly after discharge  Amount and/or Complexity of Data Reviewed Labs: ordered.           Final Clinical Impression(s) / ED Diagnoses Final diagnoses:  Peripheral edema  Polysubstance abuse [facility])  Homelessness    Rx / DC Orders ED Discharge Orders     None         Pamella Ozell LABOR, DO 05/19/23 1105

## 2023-05-19 NOTE — ED Provider Triage Note (Signed)
 Emergency Medicine Provider Triage Evaluation Note  Cody Poole , a 28 y.o. male  was evaluated in triage.  Pt complains of leg swelling  Review of Systems  Positive:  Negative: fever  Physical Exam  BP (!) 146/99 (BP Location: Left Arm)   Pulse (!) 120   Temp 97.9 F (36.6 C) (Oral)   Resp 16   Ht 6' 2 (1.88 m)   Wt 81.6 kg   SpO2 99%   BMI 23.11 kg/m  Gen:   sleepy Resp:  Normal effort  MSK:   Swollen bilat lower legs  Other:    Medical Decision Making  Medically screening exam initiated at 8:22 AM.  Appropriate orders placed.  Robbi Scurlock was informed that the remainder of the evaluation will be completed by another provider, this initial triage assessment does not replace that evaluation, and the importance of remaining in the ED until their evaluation is complete.     Flint Sonny POUR, PA-C 05/19/23 [phone]

## 2023-05-19 NOTE — ED Notes (Signed)
 Pt. Refused to let this rn recheck his BP and heart rate.  Pt. Wanted to leave.

## 2023-05-19 NOTE — Discharge Instructions (Addendum)
 You were seen in the emergency department for leg swelling Both legs are most likely swelling from prolonged standing and walking Elevate your legs and the swelling should go down As discussed, please follow-up with behavioral health urgent care for your mental health needs Return to the emergency department for severe leg pain, if you are unable to walk or have thoughts of harming yourself or others

## 2023-05-19 NOTE — ED Triage Notes (Addendum)
 Pt. Stated, My legs are hurting and swelling since 2 days ago. I took Suboxant this morning that's why Im so sleepy

## 2023-06-03 ENCOUNTER — Emergency Department (HOSPITAL_COMMUNITY)
Admission: EM | Admit: 2023-06-03 | Discharge: 2023-06-03 | Disposition: A | Discharge status: Home / Self Care | Payer: Medicaid Other | Source: Home / Self Care | Attending: Emergency Medicine | Admitting: Emergency Medicine

## 2023-06-03 ENCOUNTER — Other Ambulatory Visit: Payer: Self-pay

## 2023-06-03 ENCOUNTER — Encounter (HOSPITAL_COMMUNITY): Payer: Self-pay | Admitting: *Deleted

## 2023-06-03 ENCOUNTER — Emergency Department (HOSPITAL_COMMUNITY): Payer: Medicaid Other

## 2023-06-03 ENCOUNTER — Emergency Department (HOSPITAL_COMMUNITY)
Admission: EM | Admit: 2023-06-03 | Discharge: 2023-06-03 | Disposition: A | Discharge status: Home / Self Care | Payer: Medicaid Other | Attending: Emergency Medicine | Admitting: Emergency Medicine

## 2023-06-03 ENCOUNTER — Encounter (HOSPITAL_COMMUNITY): Payer: Self-pay | Admitting: Psychiatry

## 2023-06-03 DIAGNOSIS — K5900 Constipation, unspecified: Secondary | ICD-10-CM | POA: Insufficient documentation

## 2023-06-03 DIAGNOSIS — Z765 Malingerer [conscious simulation]: Secondary | ICD-10-CM

## 2023-06-03 DIAGNOSIS — F1910 Other psychoactive substance abuse, uncomplicated: Secondary | ICD-10-CM | POA: Diagnosis present

## 2023-06-03 DIAGNOSIS — Z5900 Homelessness unspecified: Secondary | ICD-10-CM | POA: Diagnosis not present

## 2023-06-03 DIAGNOSIS — I10 Essential (primary) hypertension: Secondary | ICD-10-CM | POA: Insufficient documentation

## 2023-06-03 DIAGNOSIS — R45851 Suicidal ideations: Secondary | ICD-10-CM | POA: Insufficient documentation

## 2023-06-03 DIAGNOSIS — R109 Unspecified abdominal pain: Secondary | ICD-10-CM

## 2023-06-03 LAB — URINALYSIS, ROUTINE W REFLEX MICROSCOPIC
Bilirubin Urine: NEGATIVE
Glucose, UA: NEGATIVE mg/dL
Hgb urine dipstick: NEGATIVE
Ketones, ur: 5 mg/dL — AB
Leukocytes,Ua: NEGATIVE
Nitrite: NEGATIVE
Protein, ur: NEGATIVE mg/dL
Specific Gravity, Urine: 1.03 (ref 1.005–1.030)
pH: 5 (ref 5.0–8.0)

## 2023-06-03 LAB — COMPREHENSIVE METABOLIC PANEL
ALT: 44 U/L (ref 0–44)
AST: 69 U/L — ABNORMAL HIGH (ref 15–41)
Albumin: 3.9 g/dL (ref 3.5–5.0)
Alkaline Phosphatase: 72 U/L (ref 38–126)
Anion gap: 13 (ref 5–15)
BUN: 21 mg/dL — ABNORMAL HIGH (ref 6–20)
CO2: 23 mmol/L (ref 22–32)
Calcium: 9.6 mg/dL (ref 8.9–10.3)
Chloride: 101 mmol/L (ref 98–111)
Creatinine, Ser: 0.97 mg/dL (ref 0.61–1.24)
GFR, Estimated: >60 mL/min (ref >60)
Glucose, Bld: 99 mg/dL (ref 70–99)
Potassium: 3.8 mmol/L (ref 3.5–5.1)
Sodium: 137 mmol/L (ref 135–145)
Total Bilirubin: 0.8 mg/dL (ref 0.0–1.2)
Total Protein: 7.4 g/dL (ref 6.5–8.1)

## 2023-06-03 LAB — ACETAMINOPHEN LEVEL: Acetaminophen (Tylenol), Serum: <10 ug/mL — ABNORMAL LOW (ref 10–30)

## 2023-06-03 LAB — RAPID URINE DRUG SCREEN, HOSP PERFORMED
Amphetamines: POSITIVE — AB
Barbiturates: NOT DETECTED
Benzodiazepines: NOT DETECTED
Cocaine: NOT DETECTED
Opiates: NOT DETECTED
Tetrahydrocannabinol: POSITIVE — AB

## 2023-06-03 LAB — ETHANOL: Alcohol, Ethyl (B): <10 mg/dL (ref <10)

## 2023-06-03 LAB — CBC
HCT: 38.5 % — ABNORMAL LOW (ref 39.0–52.0)
Hemoglobin: 12.5 g/dL — ABNORMAL LOW (ref 13.0–17.0)
MCH: 28.7 pg (ref 26.0–34.0)
MCHC: 32.5 g/dL (ref 30.0–36.0)
MCV: 88.3 fL (ref 80.0–100.0)
Platelets: 416 10*3/uL — ABNORMAL HIGH (ref 150–400)
RBC: 4.36 MIL/uL (ref 4.22–5.81)
RDW: 13.3 % (ref 11.5–15.5)
WBC: 8.8 10*3/uL (ref 4.0–10.5)
nRBC: 0 % (ref 0.0–0.2)

## 2023-06-03 LAB — SALICYLATE LEVEL: Salicylate Lvl: <7 mg/dL — ABNORMAL LOW (ref 7.0–30.0)

## 2023-06-03 LAB — LIPASE, BLOOD: Lipase: 19 U/L (ref 11–51)

## 2023-06-03 MED ORDER — BUPRENORPHINE HCL-NALOXONE HCL 8-2 MG SL SUBL
1.0000 | SUBLINGUAL_TABLET | Freq: Three times a day (TID) | SUBLINGUAL | Status: DC
  Administered 2023-06-03: 1 via SUBLINGUAL
  Filled 2023-06-03 (×2): qty 1

## 2023-06-03 MED ORDER — BUPROPION HCL ER (SR) 100 MG PO TB12
100.0000 mg | ORAL_TABLET | Freq: Every day | ORAL | Status: DC
  Administered 2023-06-03: 100 mg via ORAL
  Filled 2023-06-03: qty 1

## 2023-06-03 MED ORDER — NICOTINE 21 MG/24HR TD PT24
21.0000 mg | MEDICATED_PATCH | Freq: Every day | TRANSDERMAL | Status: DC
  Administered 2023-06-03: 21 mg via TRANSDERMAL
  Filled 2023-06-03: qty 1

## 2023-06-03 MED ORDER — ACETAMINOPHEN 325 MG PO TABS: 650.0000 mg | ORAL_TABLET | ORAL | Status: DC | PRN

## 2023-06-03 MED ORDER — BUSPIRONE HCL 10 MG PO TABS
20.0000 mg | ORAL_TABLET | Freq: Three times a day (TID) | ORAL | Status: DC
  Administered 2023-06-03: 20 mg via ORAL
  Filled 2023-06-03 (×2): qty 2

## 2023-06-03 MED ORDER — SERTRALINE HCL 100 MG PO TABS
100.0000 mg | ORAL_TABLET | Freq: Every day | ORAL | Status: DC
  Administered 2023-06-03: 100 mg via ORAL
  Filled 2023-06-03: qty 1

## 2023-06-03 MED ORDER — OLANZAPINE 10 MG PO TABS
15.0000 mg | ORAL_TABLET | Freq: Two times a day (BID) | ORAL | Status: DC
Start: 2023-06-03 — End: 2023-06-03
  Administered 2023-06-03: 15 mg via ORAL
  Filled 2023-06-03: qty 2

## 2023-06-03 NOTE — ED Provider Notes (Signed)
 [place] EMERGENCY DEPARTMENT AT [facility] Provider Note   CSN: [id number] Arrival date & time: 06/03/23  4696     History  Chief Complaint  Patient presents with   Suicidal    Cody Poole is a 28 y.o. male.  He is here with complaint of being suicidal and off of his medications and would like to be admitted psychiatrically.  He is currently homeless.  He has been taking his medicines but has not had them today and he is out of them.  Thoughts of suicide.  Was here earlier this morning for flank pain, workup showed constipation no other acute findings.  The history is provided by the patient.  Mental Health Problem Presenting symptoms: suicidal thoughts   Degree of incapacity (severity):  Unable to specify Onset quality:  Unable to specify Timing:  Constant Chronicity:  Recurrent Context: drug abuse and stressful life event   Risk factors: hx of mental illness        Home Medications Prior to Admission medications   Medication Sig Start Date End Date Taking? Authorizing Provider  albuterol (VENTOLIN HFA) 108 (90 Base) MCG/ACT inhaler Inhale 1-2 puffs into the lungs every 6 (six) hours as needed for wheezing or shortness of breath. 03/25/22   [provider]  buprenorphine-naloxone (SUBOXONE) 8-2 mg SUBL SL tablet Place 1 tablet under the tongue 3 (three) times daily. 04/24/22   [provider]  buPROPion ER (WELLBUTRIN SR) 100 MG 12 hr tablet Take 100 mg by mouth daily. 04/24/22   [provider]  busPIRone (BUSPAR) 10 MG tablet Take 20 mg by mouth 3 (three) times daily. 04/24/22   [provider]  naloxone [facility]) nasal spray 4 mg/0.1 mL Place 1 spray into the nose once. For opiate overdose 04/30/22   [provider]  nicotine (NICODERM CQ - DOSED IN MG/24 HOURS) 21 mg/24hr patch Place 1 patch (21 mg total) onto the skin daily. 11/27/21   Dameron, Nolberto Hanlon, DO  OLANZapine (ZYPREXA) 10 MG tablet Take 15 mg by mouth 2 (two)  times daily. 04/24/22   [provider]  sertraline (ZOLOFT) 100 MG tablet Take 100 mg by mouth daily. 04/24/22   [provider]      Allergies    Haldol [haloperidol]    Review of Systems   Review of Systems  Psychiatric/Behavioral:  Positive for suicidal ideas.     Physical Exam Updated Vital Signs BP 123/80 (BP Location: Right Arm)   Pulse (!) 122   Temp 99 F (37.2 C) (Oral)   Resp 16   SpO2 98%  Physical Exam Vitals and nursing note reviewed.  Constitutional:      General: He is not in acute distress.    Appearance: He is well-developed.  HENT:     Head: Normocephalic and atraumatic.  Eyes:     Conjunctiva/sclera: Conjunctivae normal.  Cardiovascular:     Rate and Rhythm: Regular rhythm. Tachycardia present.     Heart sounds: No murmur heard. Pulmonary:     Effort: Pulmonary effort is normal. No respiratory distress.     Breath sounds: Normal breath sounds.  Abdominal:     Palpations: Abdomen is soft.     Tenderness: There is no abdominal tenderness.  Musculoskeletal:        General: No deformity.     Cervical back: Neck supple.  Skin:    General: Skin is warm and dry.     Capillary Refill: Capillary refill takes less than 2  seconds.  Neurological:     General: No focal deficit present.     Mental Status: He is alert.     ED Results / Procedures / Treatments   Labs (all labs ordered are listed, but only abnormal results are displayed) Labs Reviewed  SALICYLATE LEVEL - Abnormal; Notable for the following components:      Result Value   Salicylate Lvl <7.0 (*)    All other components within normal limits  ACETAMINOPHEN LEVEL - Abnormal; Notable for the following components:   Acetaminophen (Tylenol), Serum <10 (*)    All other components within normal limits  RAPID URINE DRUG SCREEN, HOSP PERFORMED - Abnormal; Notable for the following components:   Amphetamines POSITIVE (*)    Tetrahydrocannabinol POSITIVE (*)    All other  components within normal limits  ETHANOL    EKG None  Radiology CT Renal Stone Study Result Date: 06/03/2023 CLINICAL DATA:  Abdominal and flank pain.  Kidney stones suspected. EXAM: CT ABDOMEN AND PELVIS WITHOUT CONTRAST TECHNIQUE: Multidetector CT imaging of the abdomen and pelvis was performed following the standard protocol without IV contrast. RADIATION DOSE REDUCTION: This exam was performed according to the departmental dose-optimization program which includes automated exposure control, adjustment of the mA and/or kV according to patient size and/or use of iterative reconstruction technique. COMPARISON:  10/31/2021 FINDINGS: Lower chest: Dependent atelectasis noted in the lung bases. Hepatobiliary: No suspicious focal abnormality in the liver on this study without intravenous contrast. There is no evidence for gallstones, gallbladder wall thickening, or pericholecystic fluid. No intrahepatic or extrahepatic biliary dilation. Pancreas: No focal mass lesion. No dilatation of the main duct. No intraparenchymal cyst. No peripancreatic edema. Spleen: No splenomegaly. No suspicious focal mass lesion. Adrenals/Urinary Tract: No adrenal nodule or mass. No evidence for renal or ureteral stones. No secondary changes in either kidney or ureter. No bladder stones. Stomach/Bowel: Stomach is unremarkable. No gastric wall thickening. No evidence of outlet obstruction. Duodenum is normally positioned as is the ligament of Treitz. No small bowel wall thickening. No small bowel dilatation. The terminal ileum is normal. The appendix is normal. No gross colonic mass. No colonic wall thickening. Large stool volume. Vascular/Lymphatic: No abdominal aortic aneurysm. There is no gastrohepatic or hepatoduodenal ligament lymphadenopathy. No retroperitoneal or mesenteric lymphadenopathy. No pelvic sidewall lymphadenopathy. Reproductive: The prostate gland and seminal vesicles are unremarkable. Other: No intraperitoneal free  fluid. Musculoskeletal: No worrisome lytic or sclerotic osseous abnormality. IMPRESSION: 1. No acute findings in the abdomen or pelvis. 2. Large stool volume. Imaging features could be compatible with constipation in the appropriate clinical setting. Electronically Signed   By: Kennith Center M.D.   On: 06/03/2023 05:34    Procedures Procedures    Medications Ordered in ED Medications  nicotine (NICODERM CQ - dosed in mg/24 hours) patch 21 mg (21 mg Transdermal Patch Applied 06/03/23 0922)  acetaminophen (TYLENOL) tablet 650 mg (has no administration in time range)  buprenorphine-naloxone (SUBOXONE) 8-2 mg per SL tablet 1 tablet (1 tablet Sublingual Given 06/03/23 0923)  buPROPion ER [facility] SR) 12 hr tablet 100 mg (100 mg Oral Given 06/03/23 0923)  busPIRone (BUSPAR) tablet 20 mg (20 mg Oral Given 06/03/23 0924)  OLANZapine (ZYPREXA) tablet 15 mg (15 mg Oral Given 06/03/23 0925)  sertraline (ZOLOFT) tablet 100 mg (100 mg Oral Given 06/03/23 5409)    ED Course/ Medical Decision Making/ A&P Clinical Course as of 06/03/23 1642  Thu Jun 03, 2023  1253 Patient was seen by psychiatry and they feel  the patient does not need acute hospitalization from a psychiatric standpoint. [MB]  1336 Patient was seen by psychiatry and cleared.  They feel he is appropriate for discharge and are giving him shelter resources. [MB]    Clinical Course User Index [MB] Terrilee Files, MD                                 Medical Decision Making Amount and/or Complexity of Data Reviewed Labs: ordered.  Risk OTC drugs. Prescription drug management.   This patient complains of depression suicidal patient substance abuse; this involves an extensive number of treatment Options and is a complaint that carries with it a high risk of complications and morbidity. The differential includes mental health crisis, substance abuse, homelessness  I ordered, reviewed and interpreted labs, which included lab work fairly  unremarkable I ordered medication Home medications and reviewed PMP when indicated. Previous records obtained and reviewed including multiple ED visits for substance abuse I consulted behavioral health and discussed lab and imaging findings and discussed disposition.  Social determinants considered, tobacco use Critical Interventions: None  After the interventions stated above, I reevaluated the patient and found patient to be otherwise well-appearing and voluntary Admission and further testing considered, no indications for admission or further workup at this time.  Disposition per psychiatry recommendations        Final Clinical Impression(s) / ED Diagnoses Final diagnoses:  Suicidal thoughts  Polysubstance abuse [facility])    Rx / DC Orders ED Discharge Orders     None         Terrilee Files, MD 06/03/23 [phone]

## 2023-06-03 NOTE — ED Provider Notes (Signed)
 [place] EMERGENCY DEPARTMENT AT [facility] Provider Note   CSN: [id number] Arrival date & time: 06/03/23  0230     History  Chief Complaint  Patient presents with   Abdominal Pain    Cody Poole is a 28 y.o. male.  The history is provided by the patient and medical records.  Abdominal Pain Cody Poole is a 28 y.o. male who presents to the Emergency Department complaining of flank pain.  He presents to the emergency department for evaluation of right flank pain that started 1 week ago.  Pain is sharp in nature and nonradiating.  Has associated dysuria.  He had vomiting yesterday, cannot quantify.  No diarrhea.  No associated fevers.  No prior similar symptoms.  Of note he was admitted to the hospital on January 26 with traumatic subdural hematoma, discharged home on January 31.     Home Medications Prior to Admission medications   Medication Sig Start Date End Date Taking? Authorizing Provider  albuterol (VENTOLIN HFA) 108 (90 Base) MCG/ACT inhaler Inhale 1-2 puffs into the lungs every 6 (six) hours as needed for wheezing or shortness of breath. 03/25/22   [provider]  buprenorphine-naloxone (SUBOXONE) 8-2 mg SUBL SL tablet Place 1 tablet under the tongue 3 (three) times daily. 04/24/22   [provider]  buPROPion ER (WELLBUTRIN SR) 100 MG 12 hr tablet Take 100 mg by mouth daily. 04/24/22   [provider]  busPIRone (BUSPAR) 10 MG tablet Take 20 mg by mouth 3 (three) times daily. 04/24/22   [provider]  naloxone [facility]) nasal spray 4 mg/0.1 mL Place 1 spray into the nose once. For opiate overdose 04/30/22   [provider]  nicotine (NICODERM CQ - DOSED IN MG/24 HOURS) 21 mg/24hr patch Place 1 patch (21 mg total) onto the skin daily. 11/27/21   Dameron, Nolberto Hanlon, DO  OLANZapine (ZYPREXA) 10 MG tablet Take 15 mg by mouth 2 (two) times daily. 04/24/22   [provider]  sertraline (ZOLOFT) 100 MG tablet Take 100 mg  by mouth daily. 04/24/22   [provider]      Allergies    Haldol [haloperidol]    Review of Systems   Review of Systems  Gastrointestinal:  Positive for abdominal pain.  All other systems reviewed and are negative.   Physical Exam Updated Vital Signs BP (!) 135/98 (BP Location: Right Arm)   Pulse (!) 109   Temp 98.2 F (36.8 C)   Resp 18   Ht 6\' 2"  (1.88 m)   Wt 81.6 kg   SpO2 99%   BMI 23.10 kg/m  Physical Exam Vitals and nursing note reviewed.  Constitutional:      Appearance: He is well-developed.  HENT:     Head: Normocephalic and atraumatic.  Cardiovascular:     Rate and Rhythm: Normal rate and regular rhythm.  Pulmonary:     Effort: Pulmonary effort is normal. No respiratory distress.  Abdominal:     Palpations: Abdomen is soft.     Tenderness: There is no guarding or rebound.     Comments: Mild right-sided abdominal tenderness, right CVA tenderness  Musculoskeletal:        General: No tenderness.  Skin:    General: Skin is warm and dry.  Neurological:     Mental Status: He is alert and oriented to person, place, and time.  Psychiatric:        Behavior: Behavior normal.     ED Results / Procedures /  Treatments   Labs (all labs ordered are listed, but only abnormal results are displayed) Labs Reviewed  COMPREHENSIVE METABOLIC PANEL - Abnormal; Notable for the following components:      Result Value   BUN 21 (*)    AST 69 (*)    All other components within normal limits  CBC - Abnormal; Notable for the following components:   Hemoglobin 12.5 (*)    HCT 38.5 (*)    Platelets 416 (*)    All other components within normal limits  URINALYSIS, ROUTINE W REFLEX MICROSCOPIC - Abnormal; Notable for the following components:   Ketones, ur 5 (*)    All other components within normal limits  LIPASE, BLOOD    EKG None  Radiology CT Renal Stone Study Result Date: 06/03/2023 CLINICAL DATA:  Abdominal and flank pain.  Kidney stones suspected.  EXAM: CT ABDOMEN AND PELVIS WITHOUT CONTRAST TECHNIQUE: Multidetector CT imaging of the abdomen and pelvis was performed following the standard protocol without IV contrast. RADIATION DOSE REDUCTION: This exam was performed according to the departmental dose-optimization program which includes automated exposure control, adjustment of the mA and/or kV according to patient size and/or use of iterative reconstruction technique. COMPARISON:  10/31/2021 FINDINGS: Lower chest: Dependent atelectasis noted in the lung bases. Hepatobiliary: No suspicious focal abnormality in the liver on this study without intravenous contrast. There is no evidence for gallstones, gallbladder wall thickening, or pericholecystic fluid. No intrahepatic or extrahepatic biliary dilation. Pancreas: No focal mass lesion. No dilatation of the main duct. No intraparenchymal cyst. No peripancreatic edema. Spleen: No splenomegaly. No suspicious focal mass lesion. Adrenals/Urinary Tract: No adrenal nodule or mass. No evidence for renal or ureteral stones. No secondary changes in either kidney or ureter. No bladder stones. Stomach/Bowel: Stomach is unremarkable. No gastric wall thickening. No evidence of outlet obstruction. Duodenum is normally positioned as is the ligament of Treitz. No small bowel wall thickening. No small bowel dilatation. The terminal ileum is normal. The appendix is normal. No gross colonic mass. No colonic wall thickening. Large stool volume. Vascular/Lymphatic: No abdominal aortic aneurysm. There is no gastrohepatic or hepatoduodenal ligament lymphadenopathy. No retroperitoneal or mesenteric lymphadenopathy. No pelvic sidewall lymphadenopathy. Reproductive: The prostate gland and seminal vesicles are unremarkable. Other: No intraperitoneal free fluid. Musculoskeletal: No worrisome lytic or sclerotic osseous abnormality. IMPRESSION: 1. No acute findings in the abdomen or pelvis. 2. Large stool volume. Imaging features could be  compatible with constipation in the appropriate clinical setting. Electronically Signed   By: Kennith Center M.D.   On: 06/03/2023 05:34    Procedures Procedures    Medications Ordered in ED Medications - No data to display  ED Course/ Medical Decision Making/ A&P                                 Medical Decision Making Amount and/or Complexity of Data Reviewed Labs: ordered. Radiology: ordered.   Patient here for evaluation of 1 week of flank pain.  He has mild tenderness on examination without peritoneal findings.  UA is not consistent with.  CT stone study is negative for obstructing stone, appendicitis.  CT does demonstrate large stool volume.  Suspect constipation is contributing to his pain.  Patient is able to tolerate p.o. without difficulty in the emergency department.  Feel he is stable for discharge with outpatient resources, home care for constipation.  Return precautions discussed.        Final  Clinical Impression(s) / ED Diagnoses Final diagnoses:  Right flank pain  Constipation, unspecified constipation type    Rx / DC Orders ED Discharge Orders     None         Tilden Fossa, MD 06/03/23 [phone]

## 2023-06-03 NOTE — Consult Note (Cosign Needed Addendum)
 [facility] Psychiatric Consult Initial  Patient Name: .Cody Poole  MRN: [id number]  DOB: 02-25-1996  Consult Order details:  Orders (From admission, onward)     Start     Ordered   06/03/23 0820  CONSULT TO CALL ACT TEAM       Ordering Provider: Terrilee Files, MD  Provider:  (Not yet assigned)  Question:  Reason for Consult?  Answer:  Psych consult   06/03/23 0819             Mode of Visit: In person    Psychiatry Consult Evaluation  Service Date: June 03, 2023 LOS:  LOS: 0 days  Chief Complaint: "I need you to send me some place for a couple months, I'm homeless"  Primary Psychiatric Diagnoses  Malingering 2.  Polysubstance abuse 3.  Homelessness  Assessment   Cody Poole is a 28 y.o. [name] male with a past psychiatric history of polysubstance abuse, malingering (see 05/11/2021 [facility] Admission evaluation), homelessness, and schizophrenia, with pertinent medical comorbidities/history that includes subdermal hematoma and multiple incidents of accidental overdose in the context of severe substance abuse, who initially presented this encounter for complaints of abdominal pain x 2 weeks, homelessness, nausea, and vomiting, when after being medically cleared and put up for discharge, immediately checked back into the waiting room with endorsements of a recrudescence of suicidal ideations and need to be placed in a program for medication adjustments, thus psychiatry was consulted. Patient is currently medically clear at this time and voluntary.  Upon evaluation, patient presents with symptomology that is most indicative of malingering, polysubstance abuse, and homelessness.  Evidence of this is abundantly clear from the patient's endorsements of secondary gain in the context of attempting to seek shelter due to homelessness, endorsements of severe polysubstance abuse, and endorsements of chronic homelessness.   Patient is not amenable to receiving medications upon discharge.  Patient endorses he is not amenable to addressing his substance abuse and/or mental health, outside of being conditionally placed in temporary housing.   Given evaluation performed today, recommendation is for psychiatric clearance, as well as additional recommendations listed below.  Spoke with Dr. Woodroe Mode who is in agreement with recommendation for discharge, as well as the additional recommendations listed below.  Diagnoses:  Active Hospital problems: Principal Problem:   Malingering Active Problems:   Polysubstance abuse [facility])   Homelessness    Plan   # Malingering # Homelessness # Polysubstance abuse  ## Psychiatric Recommendations:   -Continue current outpatient psychiatric medications, if/when amenable -Consider substance abuse treatment, if/when amenable -Recommend homeless shelter resources given upon discharge  ## Medical Decision Making Capacity: Patient has capacity  ## Further Work-up: None at this time  ## Disposition:-- There are no psychiatric contraindications to discharge at this time  ## Behavioral / Environmental: - No specific recommendations at this time.     ## Safety and Observation Level:  - Based on my clinical evaluation, I estimate the patient to be at low risk of self harm in the current setting and upon recommendation for discharge. - At this time, we recommend  routine. This decision is based on my review of the chart including patient's history and current presentation, interview of the patient, mental status examination, and consideration of suicide risk including evaluating suicidal ideation, plan, intent, suicidal or self-harm behaviors, risk factors, and protective factors. This judgment is based on our ability to directly address suicide risk, implement suicide prevention strategies, and develop a safety plan while the patient is in  the clinical setting. Please contact our team if there is a concern that risk level has changed.  CSSR Risk  Category:C-SSRS RISK CATEGORY: Low Risk  Suicide Risk Assessment: Patient has following modifiable risk factors for suicide: medication noncompliance, which we are addressing by treatment recommendations/evaluation/resources given. Patient has following non-modifiable or demographic risk factors for suicide: male gender and psychiatric hospitalization Patient has the following protective factors against suicide: Access to outpatient mental health care, no history of suicide attempts, and no history of NSSIB  Thank you for this consult request. Recommendations have been communicated to the primary team.  We will sign off at this time.   Lenox Ponds, NP       History of Present Illness   Cody Poole is a 28 y.o. [name] male with a past psychiatric history of polysubstance abuse, malingering (see 05/11/2021 [facility] Admission evaluation), homelessness, and schizophrenia, with pertinent medical comorbidities/history that includes subdermal hematoma and multiple incidents of accidental overdose in the context of severe substance abuse, who initially presented this encounter for complaints of abdominal pain x 2 weeks, homelessness, nausea, and vomiting, when after being medically cleared and put up for discharge, immediately checked back into the waiting room with endorsements of a recrudescence of suicidal ideations and needing to be placed in a program for medication adjustments, thus psychiatry was consulted.  Patient is currently medically clear at this time and voluntary.  Patient seen today at the [facility] emergency department for face-to-face psychiatric evaluation.  Patient endorses that after being medically evaluated he checked himself back in, because in the context of homelessness, states that he knows that if he is struggling with suicidal thoughts and depression he is, "allowed" to go inpatient for several days and or sometimes months to, "get back on my feet", states further, "that's  how it works" (referring to endorsing instability of his mental health and experiencing suicidal ideations, results in being placed in temporary housing on an inpatient psychiatric ward and/or rehabilitation facility) (has also previously stated this during [facility] encounter, see admission note 05/11/2021).   Expanding on endorsements of suicidal ideations, patient endorses that in the context of homelessness he is conditionally suicidal, states that with housing he does not feel this way, then states, "can you find me some place to go for a few months, or maybe even a few days?."    Patient educated that it is not the position of the emergency department to facilitate housing for individuals, but that resources can be given for temporary housing, such as information to obtain shelter at local facilities, to which patient endorsed that, "I have tried going to all of the places around here, they will not let me in because I do not have an ID, it was stolen."    Patient then states that he would like it if it could be considered he stay in the emergency department until he could be placed at a drug rehabilitation facility, but when asked if he was ready to address his substance abuse, states that he is not, fixates on housing.    Patient endorses he continues to use daily methamphetamines and cannabis, in addition to other various drugs when they are available, and states that he takes daily Suboxone, which is consistent with PDMP aware, and UDS is appreciably positive for methamphetamines and cannabis.  Patient additionally reports and gives insight into his drug history, states that he has used "everything" for many years now. Patient additionally endorses that he uses tobacco daily  with money he gets for disability; though denies EtOH use.  Patient endorses that like he said earlier, he desires for medication changes to be made to his psychiatric regimen, but then during our discussion, endorses he is only  amenable to this if he is housed on an inpatient unit for a couple days, again reverts back to fixation on housing and shelter, and notably endorses that he has not been taking his psychiatric medications, despite having access to them, gives no reason as to why he has not been taking them. Patient endorses that his mood is currently depressed, states that he is tired of being homeless, denies other psychosocial stressors. Patient endorses no homicidal ideations, denies auditory and/or visual hallucinations, and objectively, does not present with psychotic features. Patient orientation is intact, no concerns for fluctuations of consciousness.  Discussed with patient that given evaluation conducted today, recommendation would be for discharge, and for shelter resources to be given.  Patient endorsed that he was not happy with recommendations given today and states, "then I will just go someplace else until I am admitted".  Review of Systems  Psychiatric/Behavioral:  Positive for depression, substance abuse (Meth, cannabis) and suicidal ideas (Conditional). Negative for hallucinations. The patient has insomnia (Reports poor sleep d/t meth, cannabis, and homelessness).   All other systems reviewed and are negative.    Psychiatric and Social History  Psychiatric History:   Information collected from chart review/patient  Prev Dx/Sx: polysubstance abuse, malingering (see 05/11/2021 [facility] Admission evaluation), homelessness, and schizophrenia Current Psych Provider: Utilizes emergency services when needed Home Meds (current): None currently Previous Med Trials: Olanzapine, Zoloft, BuSpar, Wellbutrin, Suboxone, naloxone Therapy: None endorsed or reported  Prior Psych Hospitalization: Multiple, most recent [facility] 2023  Prior Self Harm: None endorsed or reported Prior Violence: None endorsed or reported  Family Psych History: None endorsed or reported Family Hx suicide: None endorsed or reported  Social  History:  Developmental Hx: None endorsed or reported Educational Hx: None endorsed or reported Occupational Hx: Homeless, gets disability check Legal Hx: None endorsed or reported Living Situation: Homeless Spiritual Hx: None endorsed or reported  Access to weapons/lethal means: None endorsed or reported  Substance History Alcohol: : None endorsed or reported  Type of alcohol : None endorsed or reported Last Drink : None endorsed or reported Number of drinks per day : None endorsed or reported History of alcohol withdrawal seizures : None endorsed or reported History of DT's : None endorsed or reported Tobacco: Daily Illicit drugs: " Everything"; primarily methamphetamines, cannabis, opiates (on Suboxone) Prescription drug abuse: : None endorsed or reported Rehab hx: : [facility]  Exam Findings  Physical Exam: As below Vital Signs:  Temp:  [98.2 F (36.8 C)-99 F (37.2 C)] 99 F (37.2 C) (02/20 0829) Pulse Rate:  [109-122] 122 (02/20 0829) Resp:  [14-18] 16 (02/20 0829) BP: (123-135)/(80-98) 123/80 (02/20 0829) SpO2:  [97 %-99 %] 98 % (02/20 0829) Weight:  [81.6 kg] 81.6 kg (02/20 0258) Blood pressure 123/80, pulse (!) 122, temperature 99 F (37.2 C), temperature source Oral, resp. rate 16, SpO2 98%. There is no height or weight on file to calculate BMI.  Physical Exam Vitals and nursing note reviewed.  Constitutional:      General: He is not in acute distress.    Appearance: Normal appearance. He is normal weight. He is not ill-appearing, toxic-appearing or diaphoretic.  Pulmonary:     Effort: Pulmonary effort is normal.  Skin:    General: Skin is warm and  dry.  Neurological:     Mental Status: He is alert and oriented to person, place, and time.     Motor: No tremor or seizure activity.  Psychiatric:        Attention and Perception: Attention and perception normal. He does not perceive auditory or visual hallucinations.        Mood and Affect: Affect normal. Mood is  depressed.        Speech: Speech normal.        Behavior: Behavior is cooperative.        Thought Content: Thought content is not paranoid. Thought content includes suicidal (Conditional) ideation. Thought content does not include homicidal ideation. Thought content does not include suicidal plan.        Cognition and Memory: Cognition and memory normal.        Judgment: Judgment normal.     Mental Status Exam: General Appearance: Disheveled  Orientation:  Full (Time, Place, and Person)  Memory:  Immediate;   Fair Recent;   Fair Remote;   Fair  Concentration:  Concentration: Fair and Attention Span: Fair  Recall:  Fair  Attention  Fair  Eye Contact:  Fair  Speech:  Clear and Coherent and Normal Rate  Language:  Fair  Volume:  Normal  Mood: Depressed  Affect:  Non-Congruent  Thought Process:  Coherent, Goal Directed, and Linear  Thought Content:  Logical  Suicidal Thoughts:  Yes.  without intent/plan (conditional)  Homicidal Thoughts:  No  Judgement:  Intact  Insight:  Present  Psychomotor Activity:  Normal  Akathisia:  No  Fund of Knowledge:  Fair      Assets:  Manufacturing systems engineer Desire for Improvement Financial Resources/Insurance Leisure Time Physical Health Resilience  Cognition:  WNL  ADL's:  Intact  AIMS (if indicated):   0     Other History   These have been pulled in through the EMR, reviewed, and updated if appropriate.  Family History:  The patient's Family history is unknown by patient.  Medical History: Past Medical History:  Diagnosis Date   Anxiety    Back pain    Cervical spondylosis    Depression    ETOH abuse    Heroin abuse (HCC)    Hypertension    IV drug abuse (HCC)    Methamphetamine use (HCC)    Opioid abuse with withdrawal (HCC)    Paranoid schizophrenia (HCC)    Polysubstance abuse (HCC)    Subdural hematoma caused by concussion (HCC) 05/08/2022   Fall/Admit to [facility] for observation   Tobacco abuse     Surgical  History: History reviewed. No pertinent surgical history.   Medications:   Current Facility-Administered Medications:    acetaminophen (TYLENOL) tablet 650 mg, 650 mg, Oral, Q4H PRN, Terrilee Files, MD   buprenorphine-naloxone (SUBOXONE) 8-2 mg per SL tablet 1 tablet, 1 tablet, Sublingual, TID, Terrilee Files, MD, 1 tablet at 06/03/23 6578   buPROPion ER [facility] SR) 12 hr tablet 100 mg, 100 mg, Oral, Daily, Terrilee Files, MD, 100 mg at 06/03/23 4696   busPIRone (BUSPAR) tablet 20 mg, 20 mg, Oral, TID, Terrilee Files, MD, 20 mg at 06/03/23 2952   nicotine (NICODERM CQ - dosed in mg/24 hours) patch 21 mg, 21 mg, Transdermal, Daily, Terrilee Files, MD, 21 mg at 06/03/23 0922   OLANZapine (ZYPREXA) tablet 15 mg, 15 mg, Oral, BID, Terrilee Files, MD, 15 mg at 06/03/23 0925   sertraline (ZOLOFT) tablet 100 mg,  100 mg, Oral, Daily, Terrilee Files, MD, 100 mg at 06/03/23 0981  Current Outpatient Medications:    ARIPiprazole (ABILIFY) 5 MG tablet, Take 5 mg by mouth daily., Disp: , Rfl:    buprenorphine-naloxone (SUBOXONE) 8-2 mg SUBL SL tablet, Place 0.5-1 tablets under the tongue See admin instructions. Take 0.5 tablet in the morning then take 1 tablet by mouth in the afternoon and bedtime., Disp: , Rfl:    busPIRone (BUSPAR) 10 MG tablet, Take 10 mg by mouth 2 (two) times daily., Disp: , Rfl:    naloxone (NARCAN) nasal spray 4 mg/0.1 mL, Place 1 spray into the nose once. For opiate overdose, Disp: , Rfl:   Allergies: Allergies  Allergen Reactions   Haldol [Haloperidol] Swelling    Tongue swelling  Dysarthria  Relieved with benadryl     Lenox Ponds, NP

## 2023-06-03 NOTE — ED Notes (Signed)
Voluntary pt for now

## 2023-06-03 NOTE — ED Triage Notes (Signed)
 The pt is c/o abd pain for 2 weeks ? Homeless very dirty  n and v

## 2023-06-03 NOTE — ED Triage Notes (Signed)
 Pt reports SI and "needing a program to go to so I can get my meds adjusted."

## 2023-06-07 ENCOUNTER — Ambulatory Visit (HOSPITAL_COMMUNITY)
Admission: EM | Admit: 2023-06-07 | Discharge: 2023-06-08 | Disposition: A | Discharge status: Other Acute Inpatient Hospital | Payer: Medicaid Other | Attending: Psychiatry | Admitting: Psychiatry

## 2023-06-07 DIAGNOSIS — Z79899 Other long term (current) drug therapy: Secondary | ICD-10-CM | POA: Insufficient documentation

## 2023-06-07 DIAGNOSIS — F32A Depression, unspecified: Secondary | ICD-10-CM | POA: Insufficient documentation

## 2023-06-07 DIAGNOSIS — F1910 Other psychoactive substance abuse, uncomplicated: Secondary | ICD-10-CM | POA: Diagnosis not present

## 2023-06-07 DIAGNOSIS — F322 Major depressive disorder, single episode, severe without psychotic features: Secondary | ICD-10-CM | POA: Insufficient documentation

## 2023-06-07 DIAGNOSIS — Z046 Encounter for general psychiatric examination, requested by authority: Secondary | ICD-10-CM

## 2023-06-07 DIAGNOSIS — R45851 Suicidal ideations: Secondary | ICD-10-CM | POA: Diagnosis not present

## 2023-06-07 DIAGNOSIS — F209 Schizophrenia, unspecified: Secondary | ICD-10-CM | POA: Diagnosis not present

## 2023-06-07 DIAGNOSIS — Z5900 Homelessness unspecified: Secondary | ICD-10-CM | POA: Diagnosis not present

## 2023-06-07 MED ORDER — NICOTINE POLACRILEX 2 MG MT GUM: 2.0000 mg | CHEWING_GUM | OROMUCOSAL | Status: DC | PRN

## 2023-06-07 MED ORDER — ALUM & MAG HYDROXIDE-SIMETH 200-200-20 MG/5ML PO SUSP: 30.0000 mL | ORAL | Status: DC | PRN

## 2023-06-07 MED ORDER — ACETAMINOPHEN 325 MG PO TABS: 650.0000 mg | ORAL_TABLET | Freq: Four times a day (QID) | ORAL | Status: DC | PRN

## 2023-06-07 MED ORDER — OLANZAPINE 10 MG IM SOLR: 10.0000 mg | Freq: Three times a day (TID) | INTRAMUSCULAR | Status: DC | PRN

## 2023-06-07 MED ORDER — OLANZAPINE 10 MG IM SOLR: 5.0000 mg | Freq: Three times a day (TID) | INTRAMUSCULAR | Status: DC | PRN

## 2023-06-07 MED ORDER — MAGNESIUM HYDROXIDE 400 MG/5ML PO SUSP: 30.0000 mL | Freq: Every day | ORAL | Status: DC | PRN

## 2023-06-07 MED ORDER — OLANZAPINE 5 MG PO TBDP
5.0000 mg | ORAL_TABLET | Freq: Three times a day (TID) | ORAL | Status: DC | PRN
  Administered 2023-06-07: 5 mg via ORAL
  Filled 2023-06-07: qty 1

## 2023-06-07 NOTE — ED Notes (Signed)
 Patient refuse to have labs drawn and refuse to give urine at this time. Patient is labile and requesting suboxone. Patient informed no medication orders for suboxone. Patient medicated per PRN orders. Encouragement and support provided and safety maintain.
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
## 2023-06-07 NOTE — ED Provider Notes (Signed)
 I completed the IVC first exam. Patient is recommended for inpatient psychiatric admission.    Isa Rankin, MD 06/15/23 [id number]
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
## 2023-06-07 NOTE — ED Provider Notes (Signed)
 [facility] Urgent Care Continuous Assessment Admission H&P  Date: 06/07/23 Patient Name: Cody Poole MRN: [id number] Chief Complaint: under IVC  Diagnoses:  Final diagnoses:  Polysubstance abuse (HCC)  Suicidal ideation  Schizophrenia, unspecified type [facility])  Involuntary commitment    HPI: Cody Poole, 28 y/o male with a polysubstance abuse, suicidal ideation, OD, schizophrenia.  Presented to [facility] via [facility] under IVC.  Per the IVC [name] with skinPolysubstance abuse [facility])  Suicidal ideation  Schizophrenia, unspecified type (HCC)  Involuntary commitment And severe anxiety.the frame he takes prescription medicines for both (Buspirone and aripiprazole). [name] self-medicate by taking multiple control substance such as cocaine, THC, methamphetamines, and methamphetamines or prescribed drugs not prescribed to him. Masaru Chamberlin tested positive for the same controlled substances on 05/31/2023 into probation office.Jamahl Lemmons contacted his probation officer [employer]a, on 06/07/23 and stated  "I'm tired of  this shit. Fuck it. Im just gonna find a gun and blow my fucking brain out". Jovann Luse has previously tried to commit suicide. Devario Bucklew experienced an overdose on 05/07/23 and [facility]  Face-to-face evaluation of patient, patient is alert and oriented x 4, speech is clear although low tone.  Patient admitted that he wanted to kill himself.  Denies HI, AVH or paranoia.  The patient admitted to using polysubstance.  However he did not specify.  I review of patient records show multiple hospitalization for substance abuse.  Patient is a threat to himself at this time.  Recommend inpatient admission when a bed becomes available.  Total Time spent with patient: 30 minutes  Musculoskeletal  Strength & Muscle Tone: within normal limits Gait & Station: normal Patient leans: N/A  Psychiatric Specialty Exam  Presentation General  Appearance:  Disheveled  Eye Contact: Fair  Speech: Clear and Coherent  Speech Volume: Normal  Handedness: Right   Mood and Affect  Mood: Dysphoric  Affect: Congruent   Thought Process  Thought Processes: Coherent  Descriptions of Associations:Intact  Orientation:Full (Time, Place and Person)  Thought Content:Illogical  Diagnosis of Schizophrenia or Schizoaffective disorder in past: Yes  Duration of Psychotic Symptoms: Greater than six months  Hallucinations:Hallucinations: None  Ideas of Reference:None  Suicidal Thoughts:Suicidal Thoughts: Yes, Active SI Active Intent and/or Plan: With Intent; With Plan  Homicidal Thoughts:Homicidal Thoughts: No   Sensorium  Memory: Immediate Fair  Judgment: Poor  Insight: Lacking   Executive Functions  Concentration: Fair  Attention Span: Fair  Recall: Poor  Fund of Knowledge: Fair  Language: Fair   Psychomotor Activity  Psychomotor Activity: Psychomotor Activity: Normal   Assets  Assets: Desire for Improvement; Housing; Resilience   Sleep  Sleep: Sleep: Fair Number of Hours of Sleep: 4   Nutritional Assessment (For OBS and FBC admissions only) Has the patient had a weight loss or gain of 10 pounds or more in the last 3 months?: No Has the patient had a decrease in food intake/or appetite?: No Does the patient have dental problems?: No Does the patient have eating habits or behaviors that may be indicators of an eating disorder including binging or inducing vomiting?: No Has the patient recently lost weight without trying?: 0 Has the patient been eating poorly because of a decreased appetite?: 0 Malnutrition Screening Tool Score: 0    Physical Exam HENT:     Head: Normocephalic.     Nose: Nose normal.  Eyes:     Pupils: Pupils are equal, round, and reactive to light.  Cardiovascular:     Rate and Rhythm: Tachycardia present.  Pulmonary:     Effort: Pulmonary effort is  normal.  Musculoskeletal:        General: Normal range of motion.     Cervical back: Normal range of motion.  Neurological:     General: No focal deficit present.     Mental Status: He is alert.  Psychiatric:        Mood and Affect: Mood normal.        Behavior: Behavior normal.        Thought Content: Thought content normal.        Judgment: Judgment normal.    Review of Systems  Constitutional: Negative.   HENT: Negative.    Eyes: Negative.   Respiratory: Negative.    Cardiovascular: Negative.   Gastrointestinal: Negative.   Genitourinary: Negative.   Musculoskeletal: Negative.   Skin: Negative.   Neurological: Negative.   Psychiatric/Behavioral:  Positive for depression, substance abuse and suicidal ideas. The patient is nervous/anxious.     Blood pressure (!) 155/104, pulse (!) 103, temperature 98.3 F (36.8 C), temperature source Oral, resp. rate 20, SpO2 97%. There is no height or weight on file to calculate BMI.  Past Psychiatric History: Schizophrenia, polysubstance abuse, suicidal ideation, OD  Is the patient at risk to self? Yes  Has the patient been a risk to self in the past 6 months? Yes .    Has the patient been a risk to self within the distant past? Yes   Is the patient a risk to others? No   Has the patient been a risk to others in the past 6 months? No   Has the patient been a risk to others within the distant past? No   Past Medical History: See chart  Family History: Unknown Polys Social History: Polysubstance abuse  Last Labs:  Admission on 06/03/2023, Discharged on 06/03/2023  Component Date Value Ref Range Status   Alcohol, Ethyl (B) 06/03/2023 <10  <10 mg/dL Final   Comment: (NOTE) Lowest detectable limit for serum alcohol is 10 mg/dL.  For medical purposes only. Performed at [facility], [address] [place], [place] [address]    Salicylate Lvl 06/03/2023 <7.0 (L)  7.0 - 30.0 mg/dL Final   Performed at [facility]  Lab, [address] [place], [place] [address]   Acetaminophen (Tylenol), Serum 06/03/2023 <10 (L)  10 - 30 ug/mL Final   Comment: (NOTE) Therapeutic concentrations vary significantly. A range of 10-30 ug/mL  may be an effective concentration for many patients. However, some  are best treated at concentrations outside of this range. Acetaminophen concentrations >150 ug/mL at 4 hours after ingestion  and >50 ug/mL at 12 hours after ingestion are often associated with  toxic reactions.  Performed at [facility], [address], [place], [place] [address]    Opiates 06/03/2023 NONE DETECTED  NONE DETECTED Final   Cocaine 06/03/2023 NONE DETECTED  NONE DETECTED Final   Benzodiazepines 06/03/2023 NONE DETECTED  NONE DETECTED Final   Amphetamines 06/03/2023 POSITIVE (A)  NONE DETECTED Final   Tetrahydrocannabinol 06/03/2023 POSITIVE (A)  NONE DETECTED Final   Barbiturates 06/03/2023 NONE DETECTED  NONE DETECTED Final   Comment: (NOTE) DRUG SCREEN FOR MEDICAL PURPOSES ONLY.  IF CONFIRMATION IS NEEDED FOR ANY PURPOSE, NOTIFY LAB WITHIN 5 DAYS.  LOWEST DETECTABLE LIMITS FOR URINE DRUG SCREEN Drug Class  Cutoff (ng/mL) Amphetamine and metabolites    1000 Barbiturate and metabolites    200 Benzodiazepine                 200 Opiates and metabolites        300 Cocaine and metabolites        300 THC                            50 Performed at [facility] Lab, [address], [place], [place] [address]   Admission on 06/03/2023, Discharged on 06/03/2023  Component Date Value Ref Range Status   Lipase 06/03/2023 19  11 - 51 U/L Final   Performed at [facility] Lab, [address] [place], [place] [address]   Sodium 06/03/2023 137  135 - 145 mmol/L Final   Potassium 06/03/2023 3.8  3.5 - 5.1 mmol/L Final   Chloride 06/03/2023 101  98 - 111 mmol/L Final   CO2 06/03/2023 23  22 - 32 mmol/L Final   Glucose, Bld 06/03/2023 99  70 - 99 mg/dL Final   Glucose  reference range applies only to samples taken after fasting for at least 8 hours.   BUN 06/03/2023 21 (H)  6 - 20 mg/dL Final   Creatinine, Ser 06/03/2023 0.97  0.61 - 1.24 mg/dL Final   Calcium 29/56/2130 9.6  8.9 - 10.3 mg/dL Final   Total Protein 86/57/8469 7.4  6.5 - 8.1 g/dL Final   Albumin 62/95/2841 3.9  3.5 - 5.0 g/dL Final   AST 32/44/0102 69 (H)  15 - 41 U/L Final   ALT 06/03/2023 44  0 - 44 U/L Final   Alkaline Phosphatase 06/03/2023 72  38 - 126 U/L Final   Total Bilirubin 06/03/2023 0.8  0.0 - 1.2 mg/dL Final   GFR, Estimated 06/03/2023 >60  >60 mL/min Final   Comment: (NOTE) Calculated using the CKD-EPI Creatinine Equation (2021)    Anion gap 06/03/2023 13  5 - 15 Final   Performed at [facility], [address], [place], [place] [address]   WBC 06/03/2023 8.8  4.0 - 10.5 K/uL Final   RBC 06/03/2023 4.36  4.22 - 5.81 MIL/uL Final   Hemoglobin 06/03/2023 12.5 (L)  13.0 - 17.0 g/dL Final   HCT 64/40/3474 38.5 (L)  39.0 - 52.0 % Final   MCV 06/03/2023 88.3  80.0 - 100.0 fL Final   MCH 06/03/2023 28.7  26.0 - 34.0 pg Final   MCHC 06/03/2023 32.5  30.0 - 36.0 g/dL Final   RDW 25/95/6387 13.3  11.5 - 15.5 % Final   Platelets 06/03/2023 416 (H)  150 - 400 K/uL Final   nRBC 06/03/2023 0.0  0.0 - 0.2 % Final   Performed at [facility], [address], [place], [place] [address]   Color, Urine 06/03/2023 YELLOW  YELLOW Final   APPearance 06/03/2023 CLEAR  CLEAR Final   Specific Gravity, Urine 06/03/2023 1.030  1.005 - 1.030 Final   pH 06/03/2023 5.0  5.0 - 8.0 Final   Glucose, UA 06/03/2023 NEGATIVE  NEGATIVE mg/dL Final   Hgb urine dipstick 06/03/2023 NEGATIVE  NEGATIVE Final   Bilirubin Urine 06/03/2023 NEGATIVE  NEGATIVE Final   Ketones, ur 06/03/2023 5 (A)  NEGATIVE mg/dL Final   Protein, ur 29/51/8841 NEGATIVE  NEGATIVE mg/dL Final   Nitrite 66/09/3014 NEGATIVE  NEGATIVE Final   Leukocytes,Ua 06/03/2023 NEGATIVE  NEGATIVE Final  Performed at [facility], [address] [place], [place] [address]  Admission on 05/19/2023, Discharged on 05/19/2023  Component Date Value Ref Range Status   WBC 05/19/2023 7.1  4.0 - 10.5 K/uL Final   RBC 05/19/2023 4.47  4.22 - 5.81 MIL/uL Final   Hemoglobin 05/19/2023 13.0  13.0 - 17.0 g/dL Final   HCT 60/45/4098 39.2  39.0 - 52.0 % Final   MCV 05/19/2023 87.7  80.0 - 100.0 fL Final   MCH 05/19/2023 29.1  26.0 - 34.0 pg Final   MCHC 05/19/2023 33.2  30.0 - 36.0 g/dL Final   RDW 11/91/4782 12.7  11.5 - 15.5 % Final   Platelets 05/19/2023 236  150 - 400 K/uL Final   nRBC 05/19/2023 0.0  0.0 - 0.2 % Final   Neutrophils Relative % 05/19/2023 74  % Final   Neutro Abs 05/19/2023 5.3  1.7 - 7.7 K/uL Final   Lymphocytes Relative 05/19/2023 16  % Final   Lymphs Abs 05/19/2023 1.1  0.7 - 4.0 K/uL Final   Monocytes Relative 05/19/2023 8  % Final   Monocytes Absolute 05/19/2023 0.6  0.1 - 1.0 K/uL Final   Eosinophils Relative 05/19/2023 1  % Final   Eosinophils Absolute 05/19/2023 0.1  0.0 - 0.5 K/uL Final   Basophils Relative 05/19/2023 1  % Final   Basophils Absolute 05/19/2023 0.0  0.0 - 0.1 K/uL Final   Immature Granulocytes 05/19/2023 0  % Final   Abs Immature Granulocytes 05/19/2023 0.01  0.00 - 0.07 K/uL Final   Performed at [facility], [address], [place], [place] [address]   Sodium 05/19/2023 137  135 - 145 mmol/L Final   Potassium 05/19/2023 3.5  3.5 - 5.1 mmol/L Final   Chloride 05/19/2023 101  98 - 111 mmol/L Final   CO2 05/19/2023 26  22 - 32 mmol/L Final   Glucose, Bld 05/19/2023 111 (H)  70 - 99 mg/dL Final   Glucose reference range applies only to samples taken after fasting for at least 8 hours.   BUN 05/19/2023 10  6 - 20 mg/dL Final   Creatinine, Ser 05/19/2023 0.84  0.61 - 1.24 mg/dL Final   Calcium 30/86/5784 9.3  8.9 - 10.3 mg/dL Final   Total Protein 69/62/9528 6.8  6.5 - 8.1 g/dL Final   Albumin 41/32/4401 4.1  3.5 - 5.0 g/dL Final   AST 02/72/5366 108 (H)  15 - 41  U/L Final   ALT 05/19/2023 49 (H)  0 - 44 U/L Final   Alkaline Phosphatase 05/19/2023 78  38 - 126 U/L Final   Total Bilirubin 05/19/2023 1.2  0.0 - 1.2 mg/dL Final   GFR, Estimated 05/19/2023 >60  >60 mL/min Final   Comment: (NOTE) Calculated using the CKD-EPI Creatinine Equation (2021)    Anion gap 05/19/2023 10  5 - 15 Final   Performed at [facility], [address], [place], [place] [address]   Alcohol, Ethyl (B) 05/19/2023 <10  <10 mg/dL Final   Comment: (NOTE) Lowest detectable limit for serum alcohol is 10 mg/dL.  For medical purposes only. Performed at [facility], [address] [place], [place] [address]     Allergies: Haldol [haloperidol]  Medications:  PTA Medications  Medication Sig   buprenorphine-naloxone (SUBOXONE) 8-2 mg SUBL SL tablet Place 0.5-1 tablets under the tongue See admin instructions. Take 0.5 tablet in the morning then take 1 tablet by mouth in the afternoon and  bedtime.   busPIRone (BUSPAR) 10 MG tablet Take 10 mg by mouth 2 (two) times daily.   naloxone (NARCAN) nasal spray 4 mg/0.1 mL Place 1 spray into the nose once. For opiate overdose   ARIPiprazole (ABILIFY) 5 MG tablet Take 5 mg by mouth daily.      Medical Decision Making  Inpatient admission    Recommendations  Based on my evaluation the patient does not appear to have an emergency medical condition.  [name], NP 06/07/23  9:08 PM   Isa Rankin, MD 06/15/23 [phone]

## 2023-06-07 NOTE — ED Notes (Signed)
 Patient resting quietly in bed with eyes closed. Respirations equal and unlabored, skin warm and dry, NAD. Routine safety checks conducted according to facility protocol. Will continue to monitor for safety.

## 2023-06-08 ENCOUNTER — Inpatient Hospital Stay (HOSPITAL_COMMUNITY)
Admission: AD | Admit: 2023-06-08 | Discharge: 2023-06-14 | DRG: 885 | Disposition: A | Discharge status: Home / Self Care | Payer: Medicaid Other | Source: Intra-hospital | Attending: Psychiatry | Admitting: Psychiatry

## 2023-06-08 ENCOUNTER — Other Ambulatory Visit: Payer: Self-pay

## 2023-06-08 ENCOUNTER — Encounter (HOSPITAL_COMMUNITY): Payer: Self-pay | Admitting: Psychiatry

## 2023-06-08 DIAGNOSIS — I10 Essential (primary) hypertension: Secondary | ICD-10-CM | POA: Diagnosis present

## 2023-06-08 DIAGNOSIS — F410 Panic disorder [episodic paroxysmal anxiety] without agoraphobia: Secondary | ICD-10-CM | POA: Diagnosis present

## 2023-06-08 DIAGNOSIS — Z79899 Other long term (current) drug therapy: Secondary | ICD-10-CM | POA: Diagnosis not present

## 2023-06-08 DIAGNOSIS — Z5948 Other specified lack of adequate food: Secondary | ICD-10-CM | POA: Diagnosis not present

## 2023-06-08 DIAGNOSIS — F209 Schizophrenia, unspecified: Principal | ICD-10-CM | POA: Diagnosis present

## 2023-06-08 DIAGNOSIS — F17200 Nicotine dependence, unspecified, uncomplicated: Secondary | ICD-10-CM | POA: Diagnosis present

## 2023-06-08 DIAGNOSIS — Z9151 Personal history of suicidal behavior: Secondary | ICD-10-CM | POA: Diagnosis not present

## 2023-06-08 DIAGNOSIS — F4310 Post-traumatic stress disorder, unspecified: Secondary | ICD-10-CM | POA: Diagnosis present

## 2023-06-08 DIAGNOSIS — R45851 Suicidal ideations: Secondary | ICD-10-CM | POA: Diagnosis present

## 2023-06-08 DIAGNOSIS — F1123 Opioid dependence with withdrawal: Secondary | ICD-10-CM | POA: Diagnosis present

## 2023-06-08 DIAGNOSIS — Z5982 Transportation insecurity: Secondary | ICD-10-CM | POA: Diagnosis not present

## 2023-06-08 DIAGNOSIS — F200 Paranoid schizophrenia: Principal | ICD-10-CM | POA: Diagnosis present

## 2023-06-08 DIAGNOSIS — Z560 Unemployment, unspecified: Secondary | ICD-10-CM | POA: Diagnosis not present

## 2023-06-08 DIAGNOSIS — F17210 Nicotine dependence, cigarettes, uncomplicated: Secondary | ICD-10-CM | POA: Diagnosis present

## 2023-06-08 DIAGNOSIS — F322 Major depressive disorder, single episode, severe without psychotic features: Principal | ICD-10-CM | POA: Diagnosis present

## 2023-06-08 DIAGNOSIS — Z5941 Food insecurity: Secondary | ICD-10-CM

## 2023-06-08 DIAGNOSIS — Z5900 Homelessness unspecified: Secondary | ICD-10-CM | POA: Diagnosis not present

## 2023-06-08 DIAGNOSIS — Z888 Allergy status to other drugs, medicaments and biological substances status: Secondary | ICD-10-CM | POA: Diagnosis not present

## 2023-06-08 DIAGNOSIS — F909 Attention-deficit hyperactivity disorder, unspecified type: Secondary | ICD-10-CM | POA: Diagnosis present

## 2023-06-08 DIAGNOSIS — Z818 Family history of other mental and behavioral disorders: Secondary | ICD-10-CM | POA: Diagnosis not present

## 2023-06-08 DIAGNOSIS — F203 Undifferentiated schizophrenia: Secondary | ICD-10-CM | POA: Diagnosis not present

## 2023-06-08 LAB — COMPREHENSIVE METABOLIC PANEL
ALT: 38 U/L (ref 0–44)
AST: 51 U/L — ABNORMAL HIGH (ref 15–41)
Albumin: 3.4 g/dL — ABNORMAL LOW (ref 3.5–5.0)
Alkaline Phosphatase: 84 U/L (ref 38–126)
Anion gap: 13 (ref 5–15)
BUN: 13 mg/dL (ref 6–20)
CO2: 27 mmol/L (ref 22–32)
Calcium: 9.8 mg/dL (ref 8.9–10.3)
Chloride: 102 mmol/L (ref 98–111)
Creatinine, Ser: 0.93 mg/dL (ref 0.61–1.24)
GFR, Estimated: >60 mL/min (ref >60)
Glucose, Bld: 133 mg/dL — ABNORMAL HIGH (ref 70–99)
Potassium: 4.6 mmol/L (ref 3.5–5.1)
Sodium: 142 mmol/L (ref 135–145)
Total Bilirubin: 0.7 mg/dL (ref 0.0–1.2)
Total Protein: 6.6 g/dL (ref 6.5–8.1)

## 2023-06-08 LAB — CBC WITH DIFFERENTIAL/PLATELET
Abs Immature Granulocytes: 0.03 10*3/uL (ref 0.00–0.07)
Basophils Absolute: 0.1 10*3/uL (ref 0.0–0.1)
Basophils Relative: 1 %
Eosinophils Absolute: 0.1 10*3/uL (ref 0.0–0.5)
Eosinophils Relative: 1 %
HCT: 40 % (ref 39.0–52.0)
Hemoglobin: 12.9 g/dL — ABNORMAL LOW (ref 13.0–17.0)
Immature Granulocytes: 0 %
Lymphocytes Relative: 17 %
Lymphs Abs: 1.3 10*3/uL (ref 0.7–4.0)
MCH: 28.4 pg (ref 26.0–34.0)
MCHC: 32.3 g/dL (ref 30.0–36.0)
MCV: 88.1 fL (ref 80.0–100.0)
Monocytes Absolute: 0.6 10*3/uL (ref 0.1–1.0)
Monocytes Relative: 8 %
Neutro Abs: 5.8 10*3/uL (ref 1.7–7.7)
Neutrophils Relative %: 73 %
Platelets: 384 10*3/uL (ref 150–400)
RBC: 4.54 MIL/uL (ref 4.22–5.81)
RDW: 13.3 % (ref 11.5–15.5)
WBC: 8 10*3/uL (ref 4.0–10.5)
nRBC: 0 % (ref 0.0–0.2)

## 2023-06-08 LAB — POCT URINE DRUG SCREEN - MANUAL ENTRY (I-SCREEN)
POC Amphetamine UR: POSITIVE — AB
POC Buprenorphine (BUP): POSITIVE — AB
POC Cocaine UR: POSITIVE — AB
POC Marijuana UR: POSITIVE — AB
POC Methadone UR: NOT DETECTED
POC Methamphetamine UR: POSITIVE — AB
POC Morphine: POSITIVE — AB
POC Oxazepam (BZO): NOT DETECTED
POC Oxycodone UR: NOT DETECTED
POC Secobarbital (BAR): NOT DETECTED

## 2023-06-08 LAB — ETHANOL: Alcohol, Ethyl (B): <10 mg/dL (ref <10)

## 2023-06-08 LAB — TSH: TSH: 2.342 u[IU]/mL (ref 0.350–4.500)

## 2023-06-08 MED ORDER — BUPRENORPHINE HCL-NALOXONE HCL 8-2 MG SL SUBL
2.5000 | SUBLINGUAL_TABLET | Freq: Every day | SUBLINGUAL | Status: DC
  Administered 2023-06-08: 2.5 via SUBLINGUAL
  Filled 2023-06-08: qty 3

## 2023-06-08 MED ORDER — HYDROXYZINE HCL 25 MG PO TABS: 25.0000 mg | ORAL_TABLET | Freq: Four times a day (QID) | ORAL | Status: DC | PRN

## 2023-06-08 MED ORDER — ONDANSETRON 4 MG PO TBDP
4.0000 mg | ORAL_TABLET | Freq: Four times a day (QID) | ORAL | Status: AC | PRN
  Administered 2023-06-10: 4 mg via ORAL
  Filled 2023-06-08 (×2): qty 1

## 2023-06-08 MED ORDER — ALUM & MAG HYDROXIDE-SIMETH 200-200-20 MG/5ML PO SUSP: 30.0000 mL | ORAL | Status: DC | PRN

## 2023-06-08 MED ORDER — LOPERAMIDE HCL 2 MG PO CAPS
2.0000 mg | ORAL_CAPSULE | ORAL | Status: AC | PRN
Start: 2023-06-08 — End: 2023-06-13
  Administered 2023-06-10: 4 mg via ORAL
  Filled 2023-06-08: qty 2

## 2023-06-08 MED ORDER — OLANZAPINE 5 MG PO TBDP
5.0000 mg | ORAL_TABLET | Freq: Three times a day (TID) | ORAL | Status: DC | PRN
  Filled 2023-06-08: qty 1

## 2023-06-08 MED ORDER — OLANZAPINE 10 MG IM SOLR: 10.0000 mg | Freq: Three times a day (TID) | INTRAMUSCULAR | Status: DC | PRN

## 2023-06-08 MED ORDER — ARIPIPRAZOLE 5 MG PO TABS
5.0000 mg | ORAL_TABLET | Freq: Every day | ORAL | Status: DC
  Administered 2023-06-09: 5 mg via ORAL
  Filled 2023-06-08 (×3): qty 1

## 2023-06-08 MED ORDER — METHOCARBAMOL 500 MG PO TABS: 500.0000 mg | ORAL_TABLET | Freq: Three times a day (TID) | ORAL | Status: DC | PRN

## 2023-06-08 MED ORDER — LOPERAMIDE HCL 2 MG PO CAPS: 2.0000 mg | ORAL_CAPSULE | ORAL | Status: DC | PRN

## 2023-06-08 MED ORDER — BUSPIRONE HCL 10 MG PO TABS
20.0000 mg | ORAL_TABLET | Freq: Two times a day (BID) | ORAL | Status: DC
  Administered 2023-06-08: 20 mg via ORAL
  Filled 2023-06-08: qty 2

## 2023-06-08 MED ORDER — DICYCLOMINE HCL 20 MG PO TABS: 20.0000 mg | ORAL_TABLET | Freq: Four times a day (QID) | ORAL | Status: DC | PRN

## 2023-06-08 MED ORDER — NAPROXEN 500 MG PO TABS
500.0000 mg | ORAL_TABLET | Freq: Two times a day (BID) | ORAL | Status: AC | PRN
  Administered 2023-06-10 – 2023-06-11 (×2): 500 mg via ORAL
  Filled 2023-06-08 (×2): qty 1

## 2023-06-08 MED ORDER — OLANZAPINE 10 MG IM SOLR: 5.0000 mg | Freq: Three times a day (TID) | INTRAMUSCULAR | Status: DC | PRN

## 2023-06-08 MED ORDER — ACETAMINOPHEN 325 MG PO TABS
650.0000 mg | ORAL_TABLET | Freq: Four times a day (QID) | ORAL | Status: DC | PRN
  Administered 2023-06-11 – 2023-06-12 (×2): 650 mg via ORAL
  Filled 2023-06-08 (×3): qty 2

## 2023-06-08 MED ORDER — NICOTINE 21 MG/24HR TD PT24
21.0000 mg | MEDICATED_PATCH | Freq: Every day | TRANSDERMAL | Status: DC
  Administered 2023-06-08: 21 mg via TRANSDERMAL
  Filled 2023-06-08: qty 1

## 2023-06-08 MED ORDER — NICOTINE POLACRILEX 2 MG MT GUM
2.0000 mg | CHEWING_GUM | OROMUCOSAL | Status: DC | PRN
  Administered 2023-06-10 – 2023-06-14 (×5): 2 mg via ORAL
  Filled 2023-06-08 (×2): qty 1

## 2023-06-08 MED ORDER — ARIPIPRAZOLE 5 MG PO TABS
5.0000 mg | ORAL_TABLET | Freq: Every day | ORAL | Status: DC
Start: 2023-06-08 — End: 2023-06-08
  Administered 2023-06-08: 5 mg via ORAL
  Filled 2023-06-08: qty 1

## 2023-06-08 MED ORDER — HYDROXYZINE HCL 25 MG PO TABS
25.0000 mg | ORAL_TABLET | Freq: Four times a day (QID) | ORAL | Status: AC | PRN
  Administered 2023-06-10 – 2023-06-11 (×3): 25 mg via ORAL
  Filled 2023-06-08 (×3): qty 1

## 2023-06-08 MED ORDER — MAGNESIUM HYDROXIDE 400 MG/5ML PO SUSP: 30.0000 mL | Freq: Every day | ORAL | Status: DC | PRN

## 2023-06-08 MED ORDER — BUPRENORPHINE HCL-NALOXONE HCL 8-2 MG SL SUBL
2.5000 | SUBLINGUAL_TABLET | Freq: Every day | SUBLINGUAL | Status: DC
  Administered 2023-06-09 – 2023-06-14 (×6): 2.5 via SUBLINGUAL
  Filled 2023-06-08 (×8): qty 3

## 2023-06-08 MED ORDER — DICYCLOMINE HCL 20 MG PO TABS
20.0000 mg | ORAL_TABLET | Freq: Four times a day (QID) | ORAL | Status: AC | PRN
  Administered 2023-06-10: 20 mg via ORAL
  Filled 2023-06-08: qty 1

## 2023-06-08 MED ORDER — BUSPIRONE HCL 10 MG PO TABS
20.0000 mg | ORAL_TABLET | Freq: Two times a day (BID) | ORAL | Status: DC
  Administered 2023-06-08 – 2023-06-09 (×2): 20 mg via ORAL
  Filled 2023-06-08 (×4): qty 2
  Filled 2023-06-08: qty 4
  Filled 2023-06-08 (×2): qty 2

## 2023-06-08 MED ORDER — NAPROXEN 500 MG PO TABS: 500.0000 mg | ORAL_TABLET | Freq: Two times a day (BID) | ORAL | Status: DC | PRN

## 2023-06-08 MED ORDER — ONDANSETRON 4 MG PO TBDP: 4.0000 mg | ORAL_TABLET | Freq: Four times a day (QID) | ORAL | Status: DC | PRN

## 2023-06-08 MED ORDER — NICOTINE 21 MG/24HR TD PT24
21.0000 mg | MEDICATED_PATCH | Freq: Every day | TRANSDERMAL | Status: DC
  Administered 2023-06-09 – 2023-06-12 (×4): 21 mg via TRANSDERMAL
  Filled 2023-06-08 (×7): qty 1

## 2023-06-08 MED ORDER — NICOTINE POLACRILEX 2 MG MT GUM: 2.0000 mg | CHEWING_GUM | OROMUCOSAL | Status: DC | PRN

## 2023-06-08 MED ORDER — METHOCARBAMOL 500 MG PO TABS
500.0000 mg | ORAL_TABLET | Freq: Three times a day (TID) | ORAL | Status: AC | PRN
  Administered 2023-06-10 – 2023-06-11 (×2): 500 mg via ORAL
  Filled 2023-06-08 (×2): qty 1

## 2023-06-08 NOTE — Plan of Care (Signed)

## 2023-06-08 NOTE — ED Notes (Signed)
 Patient provided lunch

## 2023-06-08 NOTE — ED Provider Notes (Signed)
 FBC/OBS ASAP Discharge Summary  Date and Time: 06/08/2023 12:02 PM  Name: Cody Poole  MRN:  [id number]   Discharge Diagnoses:  Final diagnoses:  Polysubstance abuse (HCC)  Suicidal ideation  Schizophrenia, unspecified type (HCC)  Involuntary commitment  HPI: Cody Poole 28-year-old male with a history of polysubstance abuse, suicidal ideation, ODD, and schizophrenia.  He initially pointed to [facility] via [facility] under IVC on 05/16/2023.  He was admitted to the continuous assessment unit while awaiting inpatient psychiatric bed availability.  Patient seen face-to-face by this provider, chart reviewed, and case consulted with Dr. Viviano Simas on 06/08/2023  Subjective:   Currently on assessment patient is observed laying his bed asleep.  He is able easily awakened.  He appears disheveled.  His speech is clear, coherent, at a normal rate with decreased tone.  He is alert/oriented x 4, cooperative, and fairly attentive.  He continues to endorse depression and has a dysphoric affect.  He contributes his substance abuse as his primary stressor/trigger.  States, "I am homeless I do not know what else to do".  UDS on admission is positive for amphetamines, buprenorphine, morphine, cocaine, methamphetamine, and THC.  He is not to elaborate on his substance use at this time.  He continues to have thoughts to end his life and he cannot contract for safety.  He admits to previous suicide attempts in the past.  He denies homicidal ideations.  He endorses a history of auditory and visual hallucinations due to diagnosis of schizophrenia.  However he is denying any auditory or visual hallucinations at this time.  He does not appear to be responding to internal/external stimuli.  Stay Summary:   Patient recommended for inpatient psychiatric admission.  IVC will be upheld.  Patient accepted to [facility] and will be transported via [employer].  Total Time spent with patient: 30 minutes  Past Psychiatric History: see  h&P Past Medical History: see h&P Family History: see h&P Family Psychiatric History: see h&P Social History: see h&P Tobacco Cessation:  Prescription not provided because: being transferred to [facility]  Current Medications:  Current Facility-Administered Medications  Medication Dose Route Frequency Provider Last Rate Last Admin   acetaminophen (TYLENOL) tablet 650 mg  650 mg Oral Q6H PRN [name], NP       alum & mag hydroxide-simeth (MAALOX/MYLANTA) 200-200-20 MG/5ML suspension 30 mL  30 mL Oral Q4H PRN [name], NP       ARIPiprazole (ABILIFY) tablet 5 mg  5 mg Oral Daily Ardis Hughs, NP       buprenorphine-naloxone (SUBOXONE) 8-2 mg per SL tablet 2.5 tablet  2.5 tablet Sublingual Daily Ardis Hughs, NP   2.5 tablet at 06/08/23 1110   busPIRone (BUSPAR) tablet 20 mg  20 mg Oral BID Ardis Hughs, NP       dicyclomine (BENTYL) tablet 20 mg  20 mg Oral Q6H PRN Ardis Hughs, NP       hydrOXYzine (ATARAX) tablet 25 mg  25 mg Oral Q6H PRN Ardis Hughs, NP       loperamide (IMODIUM) capsule 2-4 mg  2-4 mg Oral PRN Ardis Hughs, NP       magnesium hydroxide (MILK OF MAGNESIA) suspension 30 mL  30 mL Oral Daily PRN [name], NP       methocarbamol (ROBAXIN) tablet 500 mg  500 mg Oral Q8H PRN Ardis Hughs, NP       naproxen (NAPROSYN) tablet 500 mg  500  mg Oral BID PRN Ardis Hughs, NP       nicotine (NICODERM CQ - dosed in mg/24 hours) patch 21 mg  21 mg Transdermal Daily Vernard Gambles H, NP       nicotine polacrilex (NICORETTE) gum 2 mg  2 mg Oral PRN Onuoha, Chinwendu V, NP       nicotine polacrilex (NICORETTE) gum 2 mg  2 mg Oral Q4H PRN Ardis Hughs, NP       OLANZapine (ZYPREXA) injection 10 mg  10 mg Intramuscular TID PRN [name], NP       OLANZapine (ZYPREXA) injection 5 mg  5 mg Intramuscular TID PRN [name], NP       OLANZapine zydis (ZYPREXA) disintegrating tablet 5 mg  5 mg Oral TID PRN [name],  NP   5 mg at 06/07/23 2153   ondansetron (ZOFRAN-ODT) disintegrating tablet 4 mg  4 mg Oral Q6H PRN Ardis Hughs, NP       Current Outpatient Medications  Medication Sig Dispense Refill   ARIPiprazole (ABILIFY) 5 MG tablet Take 5 mg by mouth daily.     buprenorphine-naloxone (SUBOXONE) 8-2 mg SUBL SL tablet Place 2.5 tablets under the tongue daily.     busPIRone (BUSPAR) 10 MG tablet Take 20 mg by mouth 2 (two) times daily.     naloxone (NARCAN) nasal spray 4 mg/0.1 mL Place 1 spray into the nose once. For opiate overdose      PTA Medications:  Facility Ordered Medications  Medication   acetaminophen (TYLENOL) tablet 650 mg   alum & mag hydroxide-simeth (MAALOX/MYLANTA) 200-200-20 MG/5ML suspension 30 mL   magnesium hydroxide (MILK OF MAGNESIA) suspension 30 mL   OLANZapine zydis (ZYPREXA) disintegrating tablet 5 mg   OLANZapine (ZYPREXA) injection 5 mg   OLANZapine (ZYPREXA) injection 10 mg   nicotine polacrilex (NICORETTE) gum 2 mg   buprenorphine-naloxone (SUBOXONE) 8-2 mg per SL tablet 2.5 tablet   ARIPiprazole (ABILIFY) tablet 5 mg   busPIRone (BUSPAR) tablet 20 mg   nicotine (NICODERM CQ - dosed in mg/24 hours) patch 21 mg   nicotine polacrilex (NICORETTE) gum 2 mg   dicyclomine (BENTYL) tablet 20 mg   hydrOXYzine (ATARAX) tablet 25 mg   loperamide (IMODIUM) capsule 2-4 mg   methocarbamol (ROBAXIN) tablet 500 mg   naproxen (NAPROSYN) tablet 500 mg   ondansetron (ZOFRAN-ODT) disintegrating tablet 4 mg   PTA Medications  Medication Sig   buprenorphine-naloxone (SUBOXONE) 8-2 mg SUBL SL tablet Place 2.5 tablets under the tongue daily.   busPIRone (BUSPAR) 10 MG tablet Take 20 mg by mouth 2 (two) times daily.   naloxone (NARCAN) nasal spray 4 mg/0.1 mL Place 1 spray into the nose once. For opiate overdose   ARIPiprazole (ABILIFY) 5 MG tablet Take 5 mg by mouth daily.        No data to display          Flowsheet Row ED from 06/07/2023 in [facility] Most recent reading at 06/07/2023 10:02 PM ED from 06/03/2023 in [facility] Department at [facility] Most recent reading at 06/03/2023  7:48 AM ED from 06/03/2023 in [facility] Emergency Department at [facility] Most recent reading at 06/03/2023  2:59 AM  C-SSRS RISK CATEGORY Moderate Risk Low Risk No Risk       Musculoskeletal  Strength & Muscle Tone: within normal limits Gait & Station: normal Patient leans: N/A  Psychiatric Specialty Exam  Presentation  General Appearance:  Disheveled  Eye Contact: Fair  Speech: Clear and Coherent; Normal Rate  Speech Volume: Normal  Handedness: Right   Mood and Affect  Mood: Dysphoric  Affect: Congruent   Thought Process  Thought Processes: Coherent  Descriptions of Associations:Intact  Orientation:Full (Time, Place and Person)  Thought Content:Logical  Diagnosis of Schizophrenia or Schizoaffective disorder in past: Yes  Duration of Psychotic Symptoms: Greater than six months   Hallucinations:Hallucinations: None  Ideas of Reference:None  Suicidal Thoughts:Suicidal Thoughts: Yes, Active SI Active Intent and/or Plan: With Intent; With Plan; With Means to Carry Out  Homicidal Thoughts:Homicidal Thoughts: No   Sensorium  Memory: Immediate Fair; Recent Fair; Remote Fair  Judgment: Poor  Insight: Poor   Executive Functions  Concentration: Fair  Attention Span: Fair  Recall: [employer] of Knowledge: Fair  Language: Fair   Psychomotor Activity  Psychomotor Activity: Psychomotor Activity: Normal   Assets  Assets: Leisure Time; Physical Health; Resilience; Social Support   Sleep  Sleep: Sleep: Fair Number of Hours of Sleep: 4   Nutritional Assessment (For OBS and FBC admissions only) Has the patient had a weight loss or gain of 10 pounds or more in the last 3 months?: No Has the patient had a decrease in food intake/or appetite?:  No Does the patient have dental problems?: No Does the patient have eating habits or behaviors that may be indicators of an eating disorder including binging or inducing vomiting?: No Has the patient recently lost weight without trying?: 0 Has the patient been eating poorly because of a decreased appetite?: 0 Malnutrition Screening Tool Score: 0    Physical Exam  Physical Exam Constitutional:      Appearance: Normal appearance.  Eyes:     General:        Right eye: No discharge.        Left eye: No discharge.  Cardiovascular:     Rate and Rhythm: Normal rate.  Pulmonary:     Effort: Pulmonary effort is normal. No respiratory distress.  Musculoskeletal:        General: Normal range of motion.     Cervical back: Normal range of motion.  Skin:    Coloration: Skin is not jaundiced or pale.  Neurological:     Mental Status: He is alert and oriented to person, place, and time.  Psychiatric:        Attention and Perception: Attention and perception normal.        Mood and Affect: Mood is anxious and depressed. Affect is flat.        Speech: Speech normal.        Behavior: Behavior is cooperative.        Thought Content: Thought content is not paranoid or delusional.        Cognition and Memory: Cognition normal.        Judgment: Judgment is impulsive.    Review of Systems  Constitutional:  Positive for diaphoresis. Negative for chills and fever.  HENT:  Negative for hearing loss.   Respiratory:  Negative for cough.   Cardiovascular:  Negative for chest pain.  Gastrointestinal:  Positive for nausea.  Musculoskeletal: Negative.   Neurological:  Negative for dizziness and headaches.  Psychiatric/Behavioral:  Positive for depression, substance abuse and suicidal ideas. The patient is nervous/anxious.    Blood pressure (!) 137/97, pulse 92, temperature 99.1 F (37.3 C), temperature source Oral, resp. rate 18, SpO2 96%. There is no height or weight on file to calculate  BMI.  Disposition:   Discharge and readmit patient to [facility] for inpatient psychiatric admission.  IVC upheld.  Medications: Restart Suboxone 8-2 mg per SL tablet 2.5 tablets daily Restart Abilify 5 mg daily Restart BuSpar 20 mg twice daily  Start COWS protocol with as needed medications  EKG ordered    Ardis Hughs, NP 06/08/2023, 12:02 PM

## 2023-06-08 NOTE — ED Notes (Signed)
 Pt leaving now with [facility] to [facility]. All belongings being transported with [facility] & pt. Vitals in chart. All paperwork sent with pt & [employer], including IVC copies. No concerns at this time, no s/sx of distress.

## 2023-06-08 NOTE — ED Notes (Signed)
 Pt awake, aware that he is being admitted to [facility]. Lunch provided. Pt has no questions or concerns at this time, he remains calm, cooperative & pleasant. No s/sx of distress.

## 2023-06-08 NOTE — Discharge Instructions (Addendum)
 Pt has been accepted to [facility] on 06/08/2023 Bed assignment: 305-2  Pt meets inpatient criteria per: Vernard Gambles NP  Attending Physician will be: Phineas Inches, MD   Report can be called to: Adult unit: [phone]  Pt can arrive after ASAP

## 2023-06-08 NOTE — BHH Group Notes (Signed)
 B[facility] Group Notes:  (Nursing/MHT/Case Management/Adjunct)  Date:  06/08/2023  Time:  10:35 PM  Type of Therapy:   Wrap-up group  Participation Level:  Did Not Attend  Participation Quality:    Affect:    Cognitive:    Insight:    Engagement in Group:    Modes of Intervention:    Summary of Progress/Problems:  Cody Poole 06/08/2023, 10:35 PM

## 2023-06-08 NOTE — ED Notes (Signed)
 Patient resting quietly in bed with eyes closed. Respirations equal and unlabored, skin warm and dry, NAD. Routine safety checks conducted according to facility protocol. Will continue to monitor for safety.

## 2023-06-08 NOTE — ED Notes (Signed)
 Pt's parole officer called to request an update. RN informed her that information cannot be shared but that he is still here at the [facility] and awaiting care updates. She provided her contact information if we need to reach out:   Ronnie Doss Physiological scientist) [email or web address]

## 2023-06-08 NOTE — Progress Notes (Signed)
 Pt has been accepted to [facility] on 06/08/2023 Bed assignment: 305-2  Pt meets inpatient criteria per: Vernard Gambles NP  Attending Physician will be: Phineas Inches, MD   Report can be called to:  Adult unit: [phone]  Pt can arrive after ASAP   Care Team Notified: [facility] Rona Ravens RN, Vernard Gambles NP, Roseanne Reno RN, Devinny Harrington NT,    [place] Sharel Behne LCSW-A   06/08/2023 10:56 AM

## 2023-06-08 NOTE — Progress Notes (Signed)
 Patient admitted to unit, oriented to unit and unit schedule. Patient in room, calm, cooperative, denies any needs at this time.    06/08/23 1400  Psych Admission Type (Psych Patients Only)  Admission Status Involuntary  Psychosocial Assessment  Patient Complaints Substance abuse;Irritability;Anxiety  Eye Contact Fair  Facial Expression Flat  Affect Flat  Speech Logical/coherent  Interaction Guarded  Motor Activity Slow  Appearance/Hygiene Body odor;Poor hygiene  Behavior Characteristics Cooperative;Appropriate to situation  Mood Anxious  Thought Process  Coherency WDL  Content WDL  Delusions None reported or observed  Perception WDL  Hallucination None reported or observed  Judgment Limited  Confusion None  Danger to Self  Current suicidal ideation? Denies  Agreement Not to Harm Self Yes  Description of Agreement Verbal  Danger to Others  Danger to Others None reported or observed

## 2023-06-08 NOTE — ED Notes (Signed)
 Pt awake, breakfast given. Resp even and unlabored. Denies any current SI, HI or AVH. Pt denies any pain at this time. Rounding assessment info completed. No further concerns at this time.

## 2023-06-08 NOTE — Plan of Care (Signed)

## 2023-06-08 NOTE — BHH Group Notes (Signed)
 Adult Psychoeducational Group Note  Date:  06/08/2023 Time:  4:11 PM  Group Topic/Focus:  Emotional Education:   The focus of this group is to discuss what feelings/emotions are, and how they are experienced. Goals Group:   The focus of this group is to help patients establish daily goals to achieve during treatment and discuss how the patient can incorporate goal setting into their daily lives to aide in recovery. Orientation:   The focus of this group is to educate the patient on the purpose and policies of crisis stabilization and provide a format to answer questions about their admission.  The group details unit policies and expectations of patients while admitted.  Participation Level:  Did Not Attend  Additional Comments:  Did not attend  Cody Poole 06/08/2023, 4:11 PM

## 2023-06-09 ENCOUNTER — Encounter (HOSPITAL_COMMUNITY): Payer: Self-pay

## 2023-06-09 DIAGNOSIS — F322 Major depressive disorder, single episode, severe without psychotic features: Secondary | ICD-10-CM | POA: Diagnosis not present

## 2023-06-09 MED ORDER — BUSPIRONE HCL 10 MG PO TABS
10.0000 mg | ORAL_TABLET | Freq: Three times a day (TID) | ORAL | Status: DC
Start: 2023-06-09 — End: 2023-06-14
  Administered 2023-06-09 – 2023-06-14 (×13): 10 mg via ORAL
  Filled 2023-06-09 (×15): qty 1
  Filled 2023-06-09: qty 2
  Filled 2023-06-09 (×2): qty 1
  Filled 2023-06-09: qty 2

## 2023-06-09 MED ORDER — DOCUSATE SODIUM 100 MG PO CAPS
100.0000 mg | ORAL_CAPSULE | Freq: Two times a day (BID) | ORAL | Status: DC
  Administered 2023-06-09 – 2023-06-13 (×7): 100 mg via ORAL
  Filled 2023-06-09 (×14): qty 1

## 2023-06-09 MED ORDER — OLANZAPINE 5 MG PO TABS
5.0000 mg | ORAL_TABLET | Freq: Every day | ORAL | Status: DC
  Administered 2023-06-09 – 2023-06-13 (×4): 5 mg via ORAL
  Filled 2023-06-09 (×6): qty 1

## 2023-06-09 MED ORDER — CLONIDINE HCL 0.1 MG PO TABS: 0.1000 mg | ORAL_TABLET | ORAL | Status: DC | PRN

## 2023-06-09 NOTE — Plan of Care (Signed)

## 2023-06-09 NOTE — BHH Group Notes (Signed)
 Adult Psychoeducational Group Note  Date:  06/09/2023 Time:  10:24 PM  Group Topic/Focus:  Wrap-Up Group:   The focus of this group is to help patients review their daily goal of treatment and discuss progress on daily workbooks.  Participation Level:  Did Not Attend  Participation Quality:   n/a  Affect:   n/a  Cognitive:   n/a  Insight: None  Engagement in Group:   n/a  Modes of Intervention:   n/a  Additional Comments:  Jevante did not attend wrap up group  Charna Busman Long 06/09/2023, 10:24 PM

## 2023-06-09 NOTE — Plan of Care (Signed)
   Problem: Education: Goal: Knowledge of [facility] Education information/materials will improve Outcome: Progressing Goal: Emotional status will improve Outcome: Progressing Goal: Mental status will improve Outcome: Progressing Goal: Verbalization of understanding the information provided will improve Outcome: Progressing

## 2023-06-09 NOTE — BH IP Treatment Plan (Signed)
 Interdisciplinary Treatment and Diagnostic Plan Update  06/09/2023 Time of Session: 1035AM Cody Poole MRN: [id number]  Principal Diagnosis: MDD (major depressive disorder), severe (HCC)  Secondary Diagnoses: Principal Problem:   MDD (major depressive disorder), severe (HCC)   Current Medications:  Current Facility-Administered Medications  Medication Dose Route Frequency Provider Last Rate Last Admin   acetaminophen (TYLENOL) tablet 650 mg  650 mg Oral Q6H PRN Ardis Hughs, NP       alum & mag hydroxide-simeth (MAALOX/MYLANTA) 200-200-20 MG/5ML suspension 30 mL  30 mL Oral Q4H PRN Ardis Hughs, NP       ARIPiprazole (ABILIFY) tablet 5 mg  5 mg Oral Daily Ardis Hughs, NP   5 mg at 06/09/23 4098   buprenorphine-naloxone (SUBOXONE) 8-2 mg per SL tablet 2.5 tablet  2.5 tablet Sublingual Daily Ardis Hughs, NP   2.5 tablet at 06/09/23 1191   busPIRone (BUSPAR) tablet 20 mg  20 mg Oral BID Ardis Hughs, NP   20 mg at 06/09/23 4782   cloNIDine (CATAPRES) tablet 0.1 mg  0.1 mg Oral Q4H PRN Massengill, Harrold Donath, MD       dicyclomine (BENTYL) tablet 20 mg  20 mg Oral Q6H PRN Ardis Hughs, NP       hydrOXYzine (ATARAX) tablet 25 mg  25 mg Oral Q6H PRN Ardis Hughs, NP       loperamide (IMODIUM) capsule 2-4 mg  2-4 mg Oral PRN Ardis Hughs, NP       magnesium hydroxide (MILK OF MAGNESIA) suspension 30 mL  30 mL Oral Daily PRN Ardis Hughs, NP       methocarbamol (ROBAXIN) tablet 500 mg  500 mg Oral Q8H PRN Ardis Hughs, NP       naproxen (NAPROSYN) tablet 500 mg  500 mg Oral BID PRN Ardis Hughs, NP       nicotine (NICODERM CQ - dosed in mg/24 hours) patch 21 mg  21 mg Transdermal Daily Ardis Hughs, NP   21 mg at 06/09/23 9562   nicotine polacrilex (NICORETTE) gum 2 mg  2 mg Oral Q4H PRN Ardis Hughs, NP       OLANZapine (ZYPREXA) injection 10 mg  10 mg Intramuscular TID PRN Ardis Hughs, NP       OLANZapine  (ZYPREXA) injection 5 mg  5 mg Intramuscular TID PRN Ardis Hughs, NP       OLANZapine zydis (ZYPREXA) disintegrating tablet 5 mg  5 mg Oral TID PRN Ardis Hughs, NP       ondansetron (ZOFRAN-ODT) disintegrating tablet 4 mg  4 mg Oral Q6H PRN Ardis Hughs, NP       PTA Medications: Medications Prior to Admission  Medication Sig Dispense Refill Last Dose/Taking   ARIPiprazole (ABILIFY) 5 MG tablet Take 5 mg by mouth daily.      buprenorphine-naloxone (SUBOXONE) 8-2 mg SUBL SL tablet Place 2.5 tablets under the tongue daily.      busPIRone (BUSPAR) 10 MG tablet Take 20 mg by mouth 2 (two) times daily.      naloxone (NARCAN) nasal spray 4 mg/0.1 mL Place 1 spray into the nose once. For opiate overdose       Patient Stressors:    Patient Strengths:    Treatment Modalities: Medication Management, Group therapy, Case management,  1 to 1 session with clinician, Psychoeducation, Recreational therapy.   Physician Treatment Plan for Primary Diagnosis: MDD (major depressive disorder), severe (HCC) Long  Term Goal(s): Improvement in symptoms so as ready for discharge   Short Term Goals: Ability to identify changes in lifestyle to reduce recurrence of condition will improve Ability to verbalize feelings will improve Ability to disclose and discuss suicidal ideas Ability to demonstrate self-control will improve Ability to identify and develop effective coping behaviors will improve Ability to maintain clinical measurements within normal limits will improve Compliance with prescribed medications will improve Ability to identify triggers associated with substance abuse/mental health issues will improve  Medication Management: Evaluate patient's response, side effects, and tolerance of medication regimen.  Therapeutic Interventions: 1 to 1 sessions, Unit Group sessions and Medication administration.  Evaluation of Outcomes: Not Progressing  Physician Treatment Plan for  Secondary Diagnosis: Principal Problem:   MDD (major depressive disorder), severe (HCC)  Long Term Goal(s): Improvement in symptoms so as ready for discharge   Short Term Goals: Ability to identify changes in lifestyle to reduce recurrence of condition will improve Ability to verbalize feelings will improve Ability to disclose and discuss suicidal ideas Ability to demonstrate self-control will improve Ability to identify and develop effective coping behaviors will improve Ability to maintain clinical measurements within normal limits will improve Compliance with prescribed medications will improve Ability to identify triggers associated with substance abuse/mental health issues will improve     Medication Management: Evaluate patient's response, side effects, and tolerance of medication regimen.  Therapeutic Interventions: 1 to 1 sessions, Unit Group sessions and Medication administration.  Evaluation of Outcomes: Not Progressing   RN Treatment Plan for Primary Diagnosis: MDD (major depressive disorder), severe (HCC) Long Term Goal(s): Knowledge of disease and therapeutic regimen to maintain health will improve  Short Term Goals: Ability to remain free from injury will improve, Ability to verbalize frustration and anger appropriately will improve, Ability to demonstrate self-control, Ability to participate in decision making will improve, Ability to verbalize feelings will improve, Ability to disclose and discuss suicidal ideas, Ability to identify and develop effective coping behaviors will improve, and Compliance with prescribed medications will improve  Medication Management: RN will administer medications as ordered by provider, will assess and evaluate patient's response and provide education to patient for prescribed medication. RN will report any adverse and/or side effects to prescribing provider.  Therapeutic Interventions: 1 on 1 counseling sessions, Psychoeducation, Medication  administration, Evaluate responses to treatment, Monitor vital signs and CBGs as ordered, Perform/monitor CIWA, COWS, AIMS and Fall Risk screenings as ordered, Perform wound care treatments as ordered.  Evaluation of Outcomes: Not Progressing   LCSW Treatment Plan for Primary Diagnosis: MDD (major depressive disorder), severe (HCC) Long Term Goal(s): Safe transition to appropriate next level of care at discharge, Engage patient in therapeutic group addressing interpersonal concerns.  Short Term Goals: Engage patient in aftercare planning with referrals and resources, Increase social support, Increase ability to appropriately verbalize feelings, Increase emotional regulation, Facilitate acceptance of mental health diagnosis and concerns, Facilitate patient progression through stages of change regarding substance use diagnoses and concerns, Identify triggers associated with mental health/substance abuse issues, and Increase skills for wellness and recovery  Therapeutic Interventions: Assess for all discharge needs, 1 to 1 time with Social worker, Explore available resources and support systems, Assess for adequacy in community support network, Educate family and significant other(s) on suicide prevention, Complete Psychosocial Assessment, Interpersonal group therapy.  Evaluation of Outcomes: Not Progressing   Progress in Treatment: Attending groups: No. Participating in groups: No. Taking medication as prescribed: Yes. Toleration medication: Yes. Family/Significant other contact made: No, will contact:  consents pending Patient understands diagnosis: Yes. Discussing patient identified problems/goals with staff: Yes. Medical problems stabilized or resolved: Yes. Denies suicidal/homicidal ideation: Yes. Issues/concerns per patient self-inventory: No.  New problem(s) identified: No, Describe:  none reported  New Short Term/Long Term Goal(s): detox, medication management for mood  stabilization; elimination of SI thoughts; development of comprehensive mental wellness/sobriety plan   Patient Goals:  "Get into a faith based program where I can stay on suboxone"   Discharge Plan or Barriers: Patient recently admitted. CSW will continue to follow and assess for appropriate referrals and possible discharge planning.    Reason for Continuation of Hospitalization: Anxiety Depression Medication stabilization Suicidal ideation Withdrawal symptoms  Estimated Length of Stay: 5-7 days  Last 3 [place] Suicide Severity Risk Score: Flowsheet Row [facility] (Current[facility] from [facility]07/2023 in [facility] ED from 06/03/2023 in [facility] Department [facility]  C-SSRS RISK CATEGORY Moderate Risk Moderate Risk Low Risk       Last [facility] 2/9 Scores:     No data to display          Scribe for Treatment Team: Kathi Der, Theresia Majors 06/09/2023 1:44 PM

## 2023-06-09 NOTE — BHH Counselor (Addendum)
 Adult Comprehensive Assessment  Patient ID: Cody Poole, male   DOB: 23-Feb-1996, 28 y.o.   MRN: [id number]  ATTEMPT:  Patient completed 25% of the assessment.  Cody Poole said that Cody Poole has been homeless for 3 years.  Cody Poole reported having a good relationship with his mom, who raised him.  Cody Poole asked if we could continue tomorrow, as Cody Poole wasn't feeling well due to detoxing.  Patient apologized.     Alla Feeling, LCSWA  06/09/2023

## 2023-06-09 NOTE — Progress Notes (Signed)
   06/09/23 1053  Psych Admission Type (Psych Patients Only)  Admission Status Involuntary  Psychosocial Assessment  Patient Complaints Anxiety  Eye Contact Fair  Facial Expression Anxious  Affect Anxious  Speech Logical/coherent  Interaction Avoidant  Motor Activity Slow  Appearance/Hygiene Poor hygiene  Behavior Characteristics Cooperative  Mood Anxious  Thought Process  Coherency WDL  Content WDL  Delusions None reported or observed  Perception WDL  Hallucination None reported or observed  Judgment WDL  Confusion None  Danger to Self  Current suicidal ideation? Denies  Self-Injurious Behavior No self-injurious ideation or behavior indicators observed or expressed   Agreement Not to Harm Self Yes  Description of Agreement Verbal  Danger to Others  Danger to Others None reported or observed
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
## 2023-06-09 NOTE — [facility] Suicide Risk Assessment (Signed)
 Suicide Risk Assessment  Admission Assessment    [facility] Admission Suicide Risk Assessment   Nursing information obtained from:  Patient Demographic factors:  Male, [name], [name], Unemployed Current Mental Status:  Suicidal ideation indicated by others Loss Factors:  Legal issues, Financial problems / change in socioeconomic status Historical Factors:  Prior suicide attempts Risk Reduction Factors:  NA  Total Time spent with patient: 45 minutes Principal Problem: Schizophrenia (HCC) Diagnosis:  Principal Problem:   Schizophrenia (HCC) Active Problems:   PTSD (post-traumatic stress disorder)  Subjective Data: Cody Poole, 28 year old male with a history of ODD, drug overdose, polysubstance abuse, schizophrenia and suicidal ideation. The patient presented to [facility] on June 07, 2023 under an involuntary commitment (IVC) petition after contacting his probation officer endorsing suicidal ideation with a specific plan to locate a firearm and end his life. Today, the patient acknowledges contacting his probation officer on June 07, 2023, expressing frustration with his life and suicidal thoughts with a plan to acquire a firearm to end his life. He attributes these thoughts to overwhelming stressors, including being on parole, lack of stable housing, and unemployment. The patient states he has been out of prison for 2 months.The patient was admitted to [facility] for further treatment and stabilization.    Continued Clinical Symptoms:  Alcohol Use Disorder Identification Test Final Score (AUDIT): 0 The "Alcohol Use Disorders Identification Test", [name], Second Edition.  World Science writer [facility]). Score between 0-7:  no or low risk or alcohol related problems. Score between 8-15:  moderate risk of alcohol related problems. Score between 16-19:  high risk of alcohol related  problems. Score 20 or above:  warrants further diagnostic evaluation for alcohol dependence and treatment.   CLINICAL FACTORS:   Panic Attacks Alcohol/Substance Abuse/Dependencies Schizophrenia:   Command hallucinatons More than one psychiatric diagnosis Previous Psychiatric Diagnoses and Treatments   Musculoskeletal: Strength & Muscle Tone: within normal limits Gait & Station: normal Patient leans: N/A  Psychiatric Specialty Exam:  Presentation  General Appearance:  Disheveled  Eye Contact: Fair  Speech: Clear and Coherent; Normal Rate  Speech Volume: Decreased  Handedness: Right   Mood and Affect  Mood: Dysphoric; Anxious  Affect: Congruent   Thought Process  Thought Processes: Coherent; Goal Directed; Linear  Descriptions of Associations:Intact  Orientation:Full (Time, Place and Person)  Thought Content:Logical  History of Schizophrenia/Schizoaffective disorder:Yes  Duration of Psychotic Symptoms:Greater than six months  Hallucinations:Hallucinations: Auditory; Visual Description of Auditory Hallucinations: Patient reports continuouly hearing alien voices command and derogatroy in nature. Description of Visual Hallucinations: Patient reports continuouly seeing alien.  Ideas of Reference:Paranoia (Believes people want to harm him.)  Suicidal Thoughts:Suicidal Thoughts: No SI Active Intent and/or Plan: -- (Deneis)  Homicidal Thoughts:Homicidal Thoughts: No   Sensorium  Memory: Immediate Fair; Remote Fair  Judgment: Poor  Insight: Poor   Executive Functions  Concentration: Fair  Attention Span: Fair  Recall: Fair  Fund of Knowledge: Fair  Language: Fair   Psychomotor Activity  Psychomotor Activity: Psychomotor Activity: Restlessness   Assets  Assets: Desire for Improvement; Physical Health; Resilience; Social Support   Sleep  Sleep: Sleep: Fair    Physical Exam: Physical Exam See H&P  ROS See H&P   Blood pressure (!) 144/83, pulse 91, temperature (!) 97.5 F (36.4 C), temperature source Oral, resp. rate 20, height 6' (1.829 m), weight 86.2 kg, SpO2 100%. Body mass index is 25.77 kg/m.   COGNITIVE FEATURES  THAT CONTRIBUTE TO RISK:  None    SUICIDE RISK:   Moderate:  Frequent suicidal ideation with limited intensity, and duration, some specificity in terms of plans, no associated intent, good self-control, limited dysphoria/symptomatology, some risk factors present, and identifiable protective factors, including available and accessible social support.  PLAN OF CARE: See H&P   I certify that inpatient services furnished can reasonably be expected to improve the patient's condition.   Norma Fredrickson, NP 06/09/2023, 2:12 PM

## 2023-06-09 NOTE — [facility] Group Notes (Signed)
 Spirituality group facilitated by Kathleen Argue, BCC.  Group Description: Group focused on topic of hope. Patients participated in facilitated discussion around topic, connecting with one another around experiences and definitions for hope. Group members engaged with visual explorer photos, reflecting on what hope looks like for them today. Group engaged in discussion around how their definitions of hope are present today in hospital.  Modalities: Psycho-social ed, Adlerian, Narrative, MI  Patient Progress: Did not attend. Did not attend.

## 2023-06-09 NOTE — Group Note (Signed)
 Recreation Therapy Group Note   Group Topic:Stress Management  Group Date: 06/09/2023 Start Time: 1610 End Time: 0952 Facilitators: Desman Polak-McCall, LRT,CTRS Location: [address] Dayroom   Group Topic: Stress Management  Goal Area(s) Addresses:  Patient will identify positive stress management techniques. Patient will identify benefits of using stress management post d/c.  Behavioral Response:   Intervention: Insight Timer App  Activity: Meditation. LRT and patients went over meditation before listening to meditation presented in group. LRT played a meditation that focused on self-compassion and speaking positive over ones self throughout the course of the day. The meditation also focused on accepting yourself as you presently are while working towards growth. Patients were to listen and follow along as meditation played to get the most out of it.    Education:  Stress Management, Discharge Planning.   Education Outcome: Acknowledges Education   Affect/Mood: N/A   Participation Level: Did not attend    Clinical Observations/Individualized Feedback:      Plan: Continue to engage patient in RT group sessions 2-3x/week.   Cody Poole, LRT,CTRS  06/09/2023 12:00 PM

## 2023-06-10 DIAGNOSIS — F322 Major depressive disorder, single episode, severe without psychotic features: Secondary | ICD-10-CM | POA: Diagnosis not present

## 2023-06-10 NOTE — Progress Notes (Signed)
   06/09/23 2000  Psych Admission Type (Psych Patients Only)  Admission Status Involuntary  Psychosocial Assessment  Patient Complaints Isolation;Anxiety  Eye Contact Fair  Facial Expression Anxious  Affect Appropriate to circumstance  Speech Logical/coherent  Interaction Assertive  Motor Activity Slow  Appearance/Hygiene Unremarkable  Behavior Characteristics Cooperative;Appropriate to situation  Mood Depressed;Anxious  Thought Process  Coherency WDL  Content WDL  Delusions None reported or observed  Perception WDL  Hallucination None reported or observed  Judgment Poor  Confusion None  Danger to Self  Current suicidal ideation? Denies  Description of Suicide Plan none  Self-Injurious Behavior No self-injurious ideation or behavior indicators observed or expressed   Agreement Not to Harm Self Yes  Description of Agreement verbal  Danger to Others  Danger to Others None reported or observed

## 2023-06-10 NOTE — Plan of Care (Signed)

## 2023-06-10 NOTE — Progress Notes (Signed)
   06/10/23 2202  Psych Admission Type (Psych Patients Only)  Admission Status Involuntary  Psychosocial Assessment  Patient Complaints Apathy;Isolation  Eye Contact Fair  Facial Expression Anxious  Affect Appropriate to circumstance  Speech Logical/coherent  Interaction Assertive  Motor Activity Slow  Appearance/Hygiene Unremarkable  Behavior Characteristics Cooperative;Appropriate to situation  Mood Depressed  Thought Process  Coherency WDL  Content WDL  Delusions None reported or observed  Perception WDL  Hallucination None reported or observed  Judgment Poor  Confusion None  Danger to Self  Current suicidal ideation? Denies  Description of Suicide Plan none  Self-Injurious Behavior No self-injurious ideation or behavior indicators observed or expressed   Agreement Not to Harm Self Yes  Description of Agreement verbal  Danger to Others  Danger to Others None reported or observed

## 2023-06-10 NOTE — Progress Notes (Signed)
 D:  Patient continues to deny SI and HI, contracts for safety.  Denied A/V hallucinations. Laying in bed .  Prns given, bentyl, atarax, robaxin, naproxyn and zofran.  VS BP 118/73  P79.   A:  Medications administered per MD orders.  Emotional support and encouragement given patient. R:  Safety maintained with 15 minute checks.

## 2023-06-10 NOTE — Plan of Care (Signed)
 Nurse discussed anxiety, depression and coping skills with patient.

## 2023-06-10 NOTE — Progress Notes (Cosign Needed Addendum)
 Initial [facility] MD Progress Note  06/10/2023 4:03 PM Cody Poole  MRN:  [id number]  History of Present Illness: Cody Poole, 28 year old male with a history of ODD, drug overdose, polysubstance abuse, schizophrenia and suicidal ideation. The patient presented to [facility] on June 07, 2023 under an involuntary commitment (IVC) petition after contacting his probation officer endorsing suicidal ideation with a specific plan to locate a firearm and end his life. Chart review indicates that on May 31, 2023, during a probation office visit, the patient tested positive for cocaine, marijuana, methamphetamines, and unauthorized prescription medications. The patient was subsequently admitted to [facility] for further treatment and stabilization.    Chart reviewed today. Patient discussed at multidisciplinary team meeting. Nursing staff reported no concerns or behavioral issues. He slept for 8.25 hours. He is compliant with routine medication regimen. No as needed medication required overnight, according to nursing record. Blood pressure elevated at 134/98.      Subjective: Patient evaluated face-to-face by this provider.  On today's assessment, the patient reports, "I do not feel good," describing feelings of dizziness, diaphoresis, nausea and muscle cramps. He denies chest pain or shortness of breath. He was encouraged to speak with nursing staff regarding his nausea, as he has as needed medication available to help alleviate symptoms. He states that he slept well last night and reports a good appetite, noting that he ate breakfast this morning and supper last night. He is currently requesting a snack. The patient reports he has not attended any group sessions on the unit but states he will try tomorrow if he feels better.  He was encouraged to participate today if he improves. His stated goal for today is "to feel better."  He continues to endorse  auditory hallucinations, stating that he hears "all kinds of things," and visual hallucinations, describing seeing aliens.  He denies paranoia or delusional thinking.  He describes his energy level as weak and his concentration as poor. He denies medication side effects. He reports plans to attend a program with [facility] after discharge and states that social work is assisting with coordinating this.    Objectively, the patient's symptoms appear consistent with opiate withdrawal.   Case discussed with the attending psychiatrist, N. Massengill.  We will continue the current treatment plan; no medication adjustments warranted today.  Order hemoglobin A1c and lipid panel for tomorrow morning.      The following medication adjustments were made yesterday:  -Discontinue Abilify tablet 5 mg, oral, daily.  -Initiate olanzapine tablet 5 mg, oral, daily at bedtime, mood, psychosis. -Modify BuSpar tablet 20 mg, oral, 2 times daily, to 10 mg three times daily, anxiety. -Initiate Colace capsule 100 mg, oral, 2 times daily. -Initiate COWS    Principal Problem: Schizophrenia (HCC) Diagnosis: Principal Problem:   Schizophrenia (HCC) Active Problems:   PTSD (post-traumatic stress disorder)  Total Time spent with patient: 45 minutes  Past Psychiatric History: See H&P  Past Medical History:  Past Medical History:  Diagnosis Date   Anxiety    Back pain    Cervical spondylosis    Depression    ETOH abuse    Heroin abuse (HCC)    Hypertension    IV drug abuse (HCC)    Methamphetamine use (HCC)    Opioid abuse with withdrawal (HCC)    Paranoid schizophrenia (HCC)    Polysubstance abuse (HCC)    Subdural hematoma caused by concussion (HCC) 05/08/2022   Fall/Admit to [facility]  for observation   Tobacco abuse    History reviewed. No pertinent surgical history. Family History:  Family History  Family history unknown: Yes   Family Psychiatric  History: See H&P Social History:   Social History   Substance and Sexual Activity  Alcohol Use Not Currently     Social History   Substance and Sexual Activity  Drug Use Yes   Types: IV, Cocaine, Amphetamines, Heroin, Marijuana, Methamphetamines    Social History   Socioeconomic History   Marital status: Single    Spouse name: Not on file   Number of children: Not on file   Years of education: Not on file   Highest education level: Not on file  Occupational History   Not on file  Tobacco Use   Smoking status: Every Day   Smokeless tobacco: Never  Vaping Use   Vaping status: Never Used  Substance and Sexual Activity   Alcohol use: Not Currently   Drug use: Yes    Types: IV, Cocaine, Amphetamines, Heroin, Marijuana, Methamphetamines   Sexual activity: Not on file    Comment: heroin, crack cocaine  Other Topics Concern   Not on file  Social History Narrative   Not on file   Social Drivers of Health   Financial Resource Strain: Not on file  Food Insecurity: Food Insecurity Present (06/08/2023)   Hunger Vital Sign    Worried About Running Out of Food in the Last Year: Often true    Ran Out of Food in the Last Year: Often true  Transportation Needs: Unmet Transportation Needs (06/08/2023)   PRAPARE - Administrator, Civil Service (Medical): Yes    Lack of Transportation (Non-Medical): Yes  Physical Activity: Not on file  Stress: Not on file  Social Connections: Not on file   Additional Social History:                           Current Medications: Current Facility-Administered Medications  Medication Dose Route Frequency Provider Last Rate Last Admin   acetaminophen (TYLENOL) tablet 650 mg  650 mg Oral Q6H PRN Ardis Hughs, NP       alum & mag hydroxide-simeth (MAALOX/MYLANTA) 200-200-20 MG/5ML suspension 30 mL  30 mL Oral Q4H PRN Ardis Hughs, NP       buprenorphine-naloxone (SUBOXONE) 8-2 mg per SL tablet 2.5 tablet  2.5 tablet Sublingual Daily Ardis Hughs, NP   2.5 tablet at 06/10/23 0820   busPIRone (BUSPAR) tablet 10 mg  10 mg Oral Q8H Massengill, Nathan, MD   10 mg at 06/10/23 1302   cloNIDine (CATAPRES) tablet 0.1 mg  0.1 mg Oral Q4H PRN Massengill, Harrold Donath, MD       dicyclomine (BENTYL) tablet 20 mg  20 mg Oral Q6H PRN Ardis Hughs, NP   20 mg at 06/10/23 1259   docusate sodium (COLACE) capsule 100 mg  100 mg Oral BID Massengill, Harrold Donath, MD   100 mg at 06/10/23 0820   hydrOXYzine (ATARAX) tablet 25 mg  25 mg Oral Q6H PRN Ardis Hughs, NP   25 mg at 06/10/23 1300   loperamide (IMODIUM) capsule 2-4 mg  2-4 mg Oral PRN Ardis Hughs, NP   4 mg at 06/10/23 1458   magnesium hydroxide (MILK OF MAGNESIA) suspension 30 mL  30 mL Oral Daily PRN Ardis Hughs, NP       methocarbamol (ROBAXIN) tablet 500 mg  500 mg  Oral Q8H PRN Ardis Hughs, NP   500 mg at 06/10/23 1258   naproxen (NAPROSYN) tablet 500 mg  500 mg Oral BID PRN Ardis Hughs, NP   500 mg at 06/10/23 1259   nicotine (NICODERM CQ - dosed in mg/24 hours) patch 21 mg  21 mg Transdermal Daily Ardis Hughs, NP   21 mg at 06/10/23 0820   nicotine polacrilex (NICORETTE) gum 2 mg  2 mg Oral Q4H PRN Ardis Hughs, NP       OLANZapine (ZYPREXA) injection 10 mg  10 mg Intramuscular TID PRN Ardis Hughs, NP       OLANZapine (ZYPREXA) injection 5 mg  5 mg Intramuscular TID PRN Ardis Hughs, NP       OLANZapine (ZYPREXA) tablet 5 mg  5 mg Oral QHS Massengill, Nathan, MD   5 mg at 06/09/23 2117   OLANZapine zydis (ZYPREXA) disintegrating tablet 5 mg  5 mg Oral TID PRN Ardis Hughs, NP       ondansetron (ZOFRAN-ODT) disintegrating tablet 4 mg  4 mg Oral Q6H PRN Ardis Hughs, NP   4 mg at 06/10/23 1301    Lab Results: No results found for this or any previous visit (from the past 48 hours).  Blood Alcohol level:  Lab Results  Component Value Date   ETH <10 06/08/2023   ETH <10 06/03/2023    Metabolic Disorder Labs: No results  found for: "HGBA1C", "MPG" No results found for: "PROLACTIN" No results found for: "CHOL", "TRIG", "HDL", "CHOLHDL", "VLDL", "LDLCALC"  Physical Findings: AIMS:  , ,  ,  ,    CIWA:    COWS:  COWS Total Score: 0  Musculoskeletal: Strength & Muscle Tone: within normal limits Gait & Station: normal Patient leans: N/A  Psychiatric Specialty Exam:  Presentation  General Appearance:  Disheveled  Eye Contact: Fair  Speech: Clear and Coherent; Normal Rate  Speech Volume: Decreased  Handedness: Right   Mood and Affect  Mood: Dysphoric; Anxious  Affect: Congruent   Thought Process  Thought Processes: Coherent; Goal Directed; Linear  Descriptions of Associations:Intact  Orientation:Full (Time, Place and Person)  Thought Content:Logical  History of Schizophrenia/Schizoaffective disorder:Yes  Duration of Psychotic Symptoms:Greater than six months  Hallucinations:Hallucinations: Auditory; Visual Description of Auditory Hallucinations: Patient reports continuouly hearing alien voices command and derogatroy in nature. Description of Visual Hallucinations: Patient reports continuouly seeing alien.  Ideas of Reference:Paranoia (Believes people want to harm him.)  Suicidal Thoughts:Suicidal Thoughts: No SI Active Intent and/or Plan: -- (Deneis)  Homicidal Thoughts:Homicidal Thoughts: No   Sensorium  Memory: Immediate Fair; Remote Fair  Judgment: Poor  Insight: Poor   Executive Functions  Concentration: Fair  Attention Span: Fair  Recall: Fair  Fund of Knowledge: Fair  Language: Fair   Psychomotor Activity  Psychomotor Activity: Psychomotor Activity: Restlessness   Assets  Assets: Desire for Improvement; Physical Health; Resilience; Social Support   Sleep  Sleep: Sleep: Fair    Physical Exam: Physical Exam Vitals and nursing note reviewed.  Constitutional:      General: He is not in acute distress. Cardiovascular:      Rate and Rhythm: Normal rate.     Pulses: Normal pulses.  Pulmonary:     Effort: No respiratory distress.  Neurological:     Mental Status: He is alert and oriented to person, place, and time.    Review of Systems  Constitutional:  Positive for diaphoresis.  Respiratory:  Negative for shortness of  breath.   Cardiovascular:  Negative for chest pain and palpitations.  Gastrointestinal:  Positive for nausea.  Neurological:  Positive for dizziness. Negative for tingling and tremors.  Psychiatric/Behavioral:  Positive for depression, hallucinations and substance abuse. Negative for suicidal ideas. The patient is nervous/anxious. The patient does not have insomnia.    Blood pressure 118/73, pulse 98, temperature 98.6 F (37 C), temperature source Oral, resp. rate 18, height 6' (1.829 m), weight 86.2 kg, SpO2 98%. Body mass index is 25.77 kg/m.   Treatment Plan Summary: Daily contact with patient to assess and evaluate symptoms and progress in treatment and Medication management  Diagnoses / Active Problems: Principal Problem:   Schizophrenia (HCC) Active Problems:   PTSD (post-traumatic stress disorder)   PLAN: Safety and Monitoring:             -- Involuntary admission to inpatient psychiatric unit for safety, stabilization and treatment             -- Daily contact with patient to assess and evaluate symptoms and progress in treatment             -- Patient's case to be discussed in multi-disciplinary team meeting             -- Observation Level : q15 minute checks             -- Vital signs:  q12 hours             -- Precautions: suicide, elopement, and assault   2. Treatment Plan:   Medications:  -Discontinued Abilify tablet 5 mg, oral, daily. -Continue olanzapine tablet 5 mg, oral, daily at bedtime, mood, psychosis. -Continue BuSpar tablet 10 mg three times daily, anxiety. -Continue Colace capsule 100 mg, oral, 2 times daily, constipation  -Continue COWS -Continue  Suboxone 8-2 mg per SL tablet, 2.5 tablet, sublingual, daily, opioid dependence. -Continue nicotine patch 21mg /24 hours ordered, tobacco use disorder     Continue Detox Protocol Medications -Bentyl tablet 20 mg, oral, every 6 hours, as needed, spasms, abdominal cramping. -Imodium 2 to 4 Mg, Oral, As Needed, Diarrhea, or Loose Stools -Continue naproxen tablet 500 mg, oral, 2 times daily as needed, aching, pain, or discomfort -Robaxin tablet 500 mg, oral, every 8 hours, as needed, muscle spasms. -Zofran-ODT 4 mg, oral, every 6 hours as needed, nausea, vomiting.   Continue BH Agitation Protocol -- Olanzapine disintegrating tablet 5 mg, oral, 3 times daily as needed, mild agitation                                     OR  -- Olanzapine injection 5 mg, IM, 3 times daily as needed, moderate agitation                                     OR -- Olanzapine injection 10 mg, IM, 3 times daily as needed, severe agitation     Continue As Needed Medications --Hydroxyzine 25 mg oral, 3 times daily as needed, anxiety --Trazodone 50 mg, oral, daily at bedtime as needed, sleep  --Tylenol 650 mg every 6 hours, as needed, mild pain, fever --Maalox/Mylanta 30 mL oral every 4 hours as needed, indigestion --Milk of Magnesia 30 mL oral daily as needed, mild constipation -Continue Nicorette gum 2 mg, oral, every 4 hours as needed, smoking cessation     --  The risks/benefits/side-effects/alternatives to this medication were discussed in detail with the patient and time was given for questions. The patient consents to medication trial.  -- FDA             -- Metabolic profile and EKG monitoring obtained while on an atypical antipsychotic (BMI: Lipid Panel: HbgA1c: QTc:)  -- Smoking cessation encouraged             -- Encouraged patient to participate in unit milieu and in scheduled group therapies              -- Short Term Goals: Ability to identify changes in lifestyle to reduce recurrence of condition will  improve, Ability to verbalize feelings will improve, Ability to disclose and discuss suicidal ideas, Ability to demonstrate self-control will improve, Ability to identify and develop effective coping behaviors will improve, Ability to maintain clinical measurements within normal limits will improve, Compliance with prescribed medications will improve, and Ability to identify triggers associated with substance abuse/mental health issues will improve             -- Long Term Goals: Improvement in symptoms so as ready for discharge     New lab orders for 06/11/2023:  Hemoglobin A1c, lipid panel   EKG reviewed: QT/QTcB 352/393 ms   Urine drug screen: Positive for amphetamines, cocaine, marijuana, methamphetamine, morphine, Suboxone.   Labs reviewed: Glucose elevated at 133. Albumin slightly low at 3.4. AST elevated at 51 (improved since 06/03/2023 69). Albumin slightly low at 3.4. Hemoglobin slightly low at 12.9.   CT Renal Stone Study on 06/03/2023 at [facility] IMPRESSION: 1. No acute findings in the abdomen or pelvis. 2. Large stool volume. Imaging features could be compatible with constipation in the appropriate clinical setting.     3. Discharge Planning:              -- Social work and case management to assist with discharge planning and identification of hospital follow-up needs prior to discharge             -- Estimated LOS: 5-7 days             -- Discharge Concerns: Need to establish a safety plan; Medication compliance and effectiveness             -- Discharge Goals: Return home with outpatient referrals for mental health follow-up including medication management/psychotherapy      I certify that inpatient services furnished can reasonably be expected to improve the patient's condition.      Norma Fredrickson, NP 06/10/2023, 4:03 PM

## 2023-06-10 NOTE — [facility] Group Notes (Signed)
 Psychoeducational Group Note  Date:  06/10/2023 Time:  2000  Group Topic/Focus:  Wrap up group  Participation Level: Did Not Attend  Participation Quality:  Not Applicable  Affect:  Not Applicable  Cognitive:  Not Applicable  Insight:  Not Applicable  Engagement in Group: Not Applicable  Additional Comments:  Did not attend.  Marcille Buffy 06/10/2023, 9:33 PM

## 2023-06-10 NOTE — Group Note (Signed)
 LCSW Group Therapy Note  Group Date: 06/10/2023 Start Time: 1100 End Time: 1200  Participation:  did not attend   Type of Therapy:  Group Therapy  Topic:  "Speaking from the Heart: Communicating with Understanding and Empathy"  Objective: To help participants develop effective communication skills to express themselves clearly, listen actively, and navigate conflicts in a healthy way.  Goals: Increase awareness of verbal and non-verbal communication skills. Practice using "I" statements and active listening techniques. Learn coping strategies for managing communication stress.  Summary: Participants explored the importance of communication, discussed challenges, and practiced skills such as active listening and assertive expression. They reflected on past experiences and identified ways to improve communication in their daily lives.  Therapeutic Modalities: Cognitive-Behavioral Therapy (CBT): Restructuring negative thought patterns in communication. Mindfulness: Staying present and calm during conversations. Psychoeducation: Learning about effective communication techniques.   Alla Feeling, LCSWA 06/10/2023  12:45 PM
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
## 2023-06-11 DIAGNOSIS — F322 Major depressive disorder, single episode, severe without psychotic features: Secondary | ICD-10-CM | POA: Diagnosis not present

## 2023-06-11 LAB — HEMOGLOBIN A1C
Hgb A1c MFr Bld: 5.4 % (ref 4.8–5.6)
Mean Plasma Glucose: 108.28 mg/dL

## 2023-06-11 LAB — LIPID PANEL
Cholesterol: 137 mg/dL (ref 0–200)
HDL: 51 mg/dL (ref >40)
LDL Cholesterol: 71 mg/dL (ref 0–99)
Total CHOL/HDL Ratio: 2.7 ratio
Triglycerides: 74 mg/dL (ref <150)
VLDL: 15 mg/dL (ref 0–40)

## 2023-06-11 NOTE — Progress Notes (Addendum)
 [facility] MD Progress Note  06/11/2023 2:23 PM Bodee Lafoe  MRN:  [id number]  History of Present Illness: Amil Bouwman, 28 year old male with a history of ODD, drug overdose, polysubstance abuse, schizophrenia and suicidal ideation. The patient presented to [facility] on June 07, 2023 under an involuntary commitment (IVC) petition after contacting his probation officer endorsing suicidal ideation with a specific plan to locate a firearm and end his life. Chart review indicates that on May 31, 2023, during a probation office visit, the patient tested positive for cocaine, marijuana, methamphetamines, and unauthorized prescription medications. The patient was subsequently admitted to [facility] for further treatment and stabilization.    Chart reviewed today. Patient discussed at multidisciplinary team meeting. Nursing staff reported the patient 'only complained of detox pain.' He slept for 9.0 hours. He is compliant with routine medication regimen. He required as needed Hydroxyzine, Nicorette gum, Bentyl, Imodium, Naproxen, Robaxin,and Zofran according to nursing record. Vital signs within defined limits.      Subjective: Patient evaluated face-to-face by this provider.  During today's assessment, the patient was observed lying in bed upon approach.  He declined to speak privately and consented to the discussion taking place with his roommate present.  He reports that his mood is good and that his withdrawal symptoms are improving.  He describes his sleep as satisfactory and his appetite as "okay," stating that his nausea is subsiding.  He denies current auditory or visual hallucinations, stating, "none right now," but reports that his last episode occurred last night.  He denies suicidal or homicidal ideation, paranoia, or delusional thinking.  He states that his energy levels remain low with no noticeable improvement.    He states his goal for  today is to feel better. He reports that he has been unable to attend group sessions due to withdrawal symptoms and agrees to attempt attending a group session on the unit today. He denies experiencing any medication side effects. He remains interested in engaging with [facility] after discharge.    According to social work, the patient has a scheduled phone screening with [facility] on June 18, 2023, between 10:00 and 11:00 AM.  He will be able to admit to their facility on June 29, 2023, and is able to arrive on an outpatient basis.   Case discussed with the attending psychiatrist, N. Massengill. We will continue the current treatment plan; no adjustments warranted today. The projected discharge date remains Monday, March 3.      Principal Problem: Schizophrenia [facility]) Diagnosis: Principal Problem:   Schizophrenia (HCC) Active Problems:   PTSD (post-traumatic stress disorder)  Total Time spent with patient: 30 minutes  Past Psychiatric History: See H&P  Past Medical History:  Past Medical History:  Diagnosis Date   Anxiety    Back pain    Cervical spondylosis    Depression    ETOH abuse    Heroin abuse (HCC)    Hypertension    IV drug abuse (HCC)    Methamphetamine use (HCC)    Opioid abuse with withdrawal (HCC)    Paranoid schizophrenia (HCC)    Polysubstance abuse (HCC)    Subdural hematoma caused by concussion (HCC) 05/08/2022   Fall/Admit to [facility] for observation   Tobacco abuse    History reviewed. No pertinent surgical history. Family History:  Family History  Family history unknown: Yes   Family Psychiatric  History: See H&P Social History:  Social History   Substance and Sexual  Activity  Alcohol Use Not Currently     Social History   Substance and Sexual Activity  Drug Use Yes   Types: IV, Cocaine, Amphetamines, Heroin, Marijuana, Methamphetamines    Social History   Socioeconomic History   Marital status: Single    Spouse  name: Not on file   Number of children: Not on file   Years of education: Not on file   Highest education level: Not on file  Occupational History   Not on file  Tobacco Use   Smoking status: Every Day   Smokeless tobacco: Never  Vaping Use   Vaping status: Never Used  Substance and Sexual Activity   Alcohol use: Not Currently   Drug use: Yes    Types: IV, Cocaine, Amphetamines, Heroin, Marijuana, Methamphetamines   Sexual activity: Not on file    Comment: heroin, crack cocaine  Other Topics Concern   Not on file  Social History Narrative   Not on file   Social Drivers of Health   Financial Resource Strain: Not on file  Food Insecurity: Food Insecurity Present (06/08/2023)   Hunger Vital Sign    Worried About Running Out of Food in the Last Year: Often true    Ran Out of Food in the Last Year: Often true  Transportation Needs: Unmet Transportation Needs (06/08/2023)   PRAPARE - Administrator, Civil Service (Medical): Yes    Lack of Transportation (Non-Medical): Yes  Physical Activity: Not on file  Stress: Not on file  Social Connections: Not on file   Additional Social History:                           Current Medications: Current Facility-Administered Medications  Medication Dose Route Frequency Provider Last Rate Last Admin   acetaminophen (TYLENOL) tablet 650 mg  650 mg Oral Q6H PRN Ardis Hughs, NP   650 mg at 06/11/23 0849   alum & mag hydroxide-simeth (MAALOX/MYLANTA) 200-200-20 MG/5ML suspension 30 mL  30 mL Oral Q4H PRN Ardis Hughs, NP       buprenorphine-naloxone (SUBOXONE) 8-2 mg per SL tablet 2.5 tablet  2.5 tablet Sublingual Daily Ardis Hughs, NP   2.5 tablet at 06/11/23 0845   busPIRone (BUSPAR) tablet 10 mg  10 mg Oral Q8H Massengill, Harrold Donath, MD   10 mg at 06/11/23 1401   cloNIDine (CATAPRES) tablet 0.1 mg  0.1 mg Oral Q4H PRN Massengill, Harrold Donath, MD       dicyclomine (BENTYL) tablet 20 mg  20 mg Oral Q6H PRN  Ardis Hughs, NP   20 mg at 06/10/23 1259   docusate sodium (COLACE) capsule 100 mg  100 mg Oral BID Massengill, Harrold Donath, MD   100 mg at 06/11/23 0846   hydrOXYzine (ATARAX) tablet 25 mg  25 mg Oral Q6H PRN Ardis Hughs, NP   25 mg at 06/11/23 1153   loperamide (IMODIUM) capsule 2-4 mg  2-4 mg Oral PRN Ardis Hughs, NP   4 mg at 06/10/23 1458   magnesium hydroxide (MILK OF MAGNESIA) suspension 30 mL  30 mL Oral Daily PRN Ardis Hughs, NP       methocarbamol (ROBAXIN) tablet 500 mg  500 mg Oral Q8H PRN Ardis Hughs, NP   500 mg at 06/10/23 1258   naproxen (NAPROSYN) tablet 500 mg  500 mg Oral BID PRN Ardis Hughs, NP   500 mg at 06/10/23 1259  nicotine (NICODERM CQ - dosed in mg/24 hours) patch 21 mg  21 mg Transdermal Daily Ardis Hughs, NP   21 mg at 06/11/23 0847   nicotine polacrilex (NICORETTE) gum 2 mg  2 mg Oral Q4H PRN Ardis Hughs, NP   2 mg at 06/10/23 1646   OLANZapine (ZYPREXA) injection 10 mg  10 mg Intramuscular TID PRN Ardis Hughs, NP       OLANZapine (ZYPREXA) injection 5 mg  5 mg Intramuscular TID PRN Ardis Hughs, NP       OLANZapine (ZYPREXA) tablet 5 mg  5 mg Oral QHS Massengill, Harrold Donath, MD   5 mg at 06/10/23 2052   OLANZapine zydis (ZYPREXA) disintegrating tablet 5 mg  5 mg Oral TID PRN Ardis Hughs, NP       ondansetron (ZOFRAN-ODT) disintegrating tablet 4 mg  4 mg Oral Q6H PRN Ardis Hughs, NP   4 mg at 06/10/23 1301    Lab Results:  Results for orders placed or performed during the hospital encounter of 06/08/23 (from the past 48 hours)  Lipid panel     Status: None   Collection Time: 06/11/23  6:20 AM  Result Value Ref Range   Cholesterol 137 0 - 200 mg/dL   Triglycerides 74 <413 mg/dL   HDL 51 >24 mg/dL   Total CHOL/HDL Ratio 2.7 RATIO   VLDL 15 0 - 40 mg/dL   LDL Cholesterol 71 0 - 99 mg/dL    Comment:        Total Cholesterol/HDL:CHD Risk Coronary Heart Disease Risk Table                      Men   Women  1/2 Average Risk   3.4   3.3  Average Risk       5.0   4.4  2 X Average Risk   9.6   7.1  3 X Average Risk  23.4   11.0        Use the calculated Patient Ratio above and the CHD Risk Table to determine the patient's CHD Risk.        ATP III CLASSIFICATION (LDL):  <100     mg/dL   Optimal  401-027  mg/dL   Near or Above                    Optimal  130-159  mg/dL   Borderline  253-664  mg/dL   High  >403     mg/dL   Very High Performed at [facility], [address], [place], [place] [address]   Hemoglobin A1c     Status: None   Collection Time: 06/11/23  6:20 AM  Result Value Ref Range   Hgb A1c MFr Bld 5.4 4.8 - 5.6 %    Comment: (NOTE) Pre diabetes:          5.7%-6.4%  Diabetes:              >6.4%  Glycemic control for   <7.0% adults with diabetes    Mean Plasma Glucose 108.28 mg/dL    Comment: Performed at [facility], [address] [place], [place] [address]    Blood Alcohol level:  Lab Results  Component Value Date   [facility] <10 06/08/2023   ETH <10 06/03/2023    Metabolic Disorder Labs: Lab Results  Component Value Date   HGBA1C 5.4 06/11/2023   MPG 108.28 06/11/2023  No results found for: "PROLACTIN" Lab Results  Component Value Date   CHOL 137 06/11/2023   TRIG 74 06/11/2023   HDL 51 06/11/2023   CHOLHDL 2.7 06/11/2023   VLDL 15 06/11/2023   LDLCALC 71 06/11/2023    Physical Findings: AIMS:  , ,  ,  ,    CIWA:    COWS:  COWS Total Score: 5  Musculoskeletal: Strength & Muscle Tone: within normal limits Gait & Station: normal Patient leans: N/A  Psychiatric Specialty Exam:  Presentation  General Appearance:  Disheveled  Eye Contact: Fair  Speech: Clear and Coherent; Normal Rate  Speech Volume: Decreased  Handedness: Right   Mood and Affect  Mood: Dysphoric; Anxious  Affect: Congruent   Thought Process  Thought Processes: Coherent; Goal Directed; Linear  Descriptions of  Associations:Intact  Orientation:Full (Time, Place and Person)  Thought Content:Logical  History of Schizophrenia/Schizoaffective disorder:Yes  Duration of Psychotic Symptoms:Greater than six months  Hallucinations:No data recorded  Ideas of Reference:Paranoia (Believes people want to harm him.)  Suicidal Thoughts:No data recorded  Homicidal Thoughts:No data recorded   Sensorium  Memory: Immediate Fair; Remote Fair  Judgment: Poor  Insight: Poor   Executive Functions  Concentration: Fair  Attention Span: Fair  Recall: Fair  Fund of Knowledge: Fair  Language: Fair   Psychomotor Activity  Psychomotor Activity: No data recorded   Assets  Assets: Desire for Improvement; Physical Health; Resilience; Social Support   Sleep  Sleep: No data recorded    Physical Exam: Physical Exam Vitals and nursing note reviewed.  Constitutional:      General: He is not in acute distress. Cardiovascular:     Rate and Rhythm: Normal rate.     Pulses: Normal pulses.  Pulmonary:     Effort: No respiratory distress.  Neurological:     Mental Status: He is alert and oriented to person, place, and time.    Review of Systems  Constitutional:  Positive for diaphoresis.  Respiratory:  Negative for shortness of breath.   Cardiovascular:  Negative for chest pain and palpitations.  Gastrointestinal:  Positive for nausea.  Neurological:  Positive for dizziness. Negative for tingling and tremors.  Psychiatric/Behavioral:  Positive for depression, hallucinations and substance abuse. Negative for suicidal ideas. The patient is nervous/anxious. The patient does not have insomnia.    Blood pressure 112/88, pulse 96, temperature 98 F (36.7 C), temperature source Oral, resp. rate 16, height 6' (1.829 m), weight 86.2 kg, SpO2 99%. Body mass index is 25.77 kg/m.   Treatment Plan Summary: Daily contact with patient to assess and evaluate symptoms and progress in treatment  and Medication management  Diagnoses / Active Problems: Principal Problem:   Schizophrenia (HCC) Active Problems:   PTSD (post-traumatic stress disorder)   PLAN: Safety and Monitoring:             -- Involuntary admission to inpatient psychiatric unit for safety, stabilization and treatment             -- Daily contact with patient to assess and evaluate symptoms and progress in treatment             -- Patient's case to be discussed in multi-disciplinary team meeting             -- Observation Level : q15 minute checks             -- Vital signs:  q12 hours             -- Precautions:  suicide, elopement, and assault   2. Treatment Plan:   Medications:  -Discontinued Abilify tablet 5 mg, oral, daily. -Continue olanzapine tablet 5 mg, oral, daily at bedtime, mood, psychosis. -Continue BuSpar tablet 10 mg three times daily, anxiety. -Continue Colace capsule 100 mg, oral, 2 times daily, constipation  -Continue COWS -Continue Suboxone 8-2 mg per SL tablet, 2.5 tablet, sublingual, daily, opioid dependence. -Continue nicotine patch 21mg /24 hours ordered, tobacco use disorder     Continue Detox Protocol Medications -Bentyl tablet 20 mg, oral, every 6 hours, as needed, spasms, abdominal cramping. -Imodium 2 to 4 Mg, Oral, As Needed, Diarrhea, or Loose Stools -Continue naproxen tablet 500 mg, oral, 2 times daily as needed, aching, pain, or discomfort -Robaxin tablet 500 mg, oral, every 8 hours, as needed, muscle spasms. -Zofran-ODT 4 mg, oral, every 6 hours as needed, nausea, vomiting.   Continue BH Agitation Protocol -- Olanzapine disintegrating tablet 5 mg, oral, 3 times daily as needed, mild agitation                                     OR  -- Olanzapine injection 5 mg, IM, 3 times daily as needed, moderate agitation                                     OR -- Olanzapine injection 10 mg, IM, 3 times daily as needed, severe agitation     Continue As Needed  Medications --Hydroxyzine 25 mg oral, 3 times daily as needed, anxiety --Trazodone 50 mg, oral, daily at bedtime as needed, sleep  --Tylenol 650 mg every 6 hours, as needed, mild pain, fever --Maalox/Mylanta 30 mL oral every 4 hours as needed, indigestion --Milk of Magnesia 30 mL oral daily as needed, mild constipation -Continue Nicorette gum 2 mg, oral, every 4 hours as needed, smoking cessation     --  The risks/benefits/side-effects/alternatives to this medication were discussed in detail with the patient and time was given for questions. The patient consents to medication trial.  -- FDA             -- Metabolic profile and EKG monitoring obtained while on an atypical antipsychotic (BMI: Lipid Panel: HbgA1c: QTc:)  -- Smoking cessation encouraged             -- Encouraged patient to participate in unit milieu and in scheduled group therapies              -- Short Term Goals: Ability to identify changes in lifestyle to reduce recurrence of condition will improve, Ability to verbalize feelings will improve, Ability to disclose and discuss suicidal ideas, Ability to demonstrate self-control will improve, Ability to identify and develop effective coping behaviors will improve, Ability to maintain clinical measurements within normal limits will improve, Compliance with prescribed medications will improve, and Ability to identify triggers associated with substance abuse/mental health issues will improve             -- Long Term Goals: Improvement in symptoms so as ready for discharge        EKG reviewed: QT/QTcB 352/393 ms   Urine drug screen: Positive for amphetamines, cocaine, marijuana, methamphetamine, morphine, Suboxone.   Labs reviewed: Glucose elevated at 133. Albumin slightly low at 3.4. AST elevated at 51 (improved since 06/03/2023 69). Albumin slightly low at 3.4. Hemoglobin slightly low  at 12.9. Hemoglobin A1c and lipid panel within range.    CT Renal Stone Study on 06/03/2023 at  [facility] IMPRESSION: 1. No acute findings in the abdomen or pelvis. 2. Large stool volume. Imaging features could be compatible with constipation in the appropriate clinical setting.     3. Discharge Planning:              -- Social work and case management to assist with discharge planning and identification of hospital follow-up needs prior to discharge             -- Estimated LOS: 5-7 days             -- Discharge Concerns: Need to establish a safety plan; Medication compliance and effectiveness             -- Discharge Goals: Return home with outpatient referrals for mental health follow-up including medication management/psychotherapy      I certify that inpatient services furnished can reasonably be expected to improve the patient's condition.      Norma Fredrickson, NP 06/11/2023, 2:23 PM Patient ID: [name], male   DOB: 08/11/1995, 28 y.o.   MRN: [id number]

## 2023-06-11 NOTE — Progress Notes (Signed)
   06/11/23 1100  Psych Admission Type (Psych Patients Only)  Admission Status Involuntary  Psychosocial Assessment  Patient Complaints Isolation;Substance abuse  Eye Contact Fair  Facial Expression Anxious  Affect Appropriate to circumstance  Speech Logical/coherent  Interaction Assertive  Motor Activity Fidgety  Appearance/Hygiene Unremarkable  Behavior Characteristics Cooperative  Mood Depressed;Anxious  Thought Process  Coherency WDL  Content WDL  Delusions None reported or observed  Perception WDL  Hallucination None reported or observed  Judgment Poor  Confusion None  Danger to Self  Current suicidal ideation? Denies  Description of Suicide Plan None  Self-Injurious Behavior No self-injurious ideation or behavior indicators observed or expressed   Agreement Not to Harm Self Yes  Description of Agreement verbal  Danger to Others  Danger to Others None reported or observed

## 2023-06-11 NOTE — Plan of Care (Signed)

## 2023-06-11 NOTE — Group Note (Signed)
 Recreation Therapy Group Note   Group Topic:Problem Solving  Group Date: 06/11/2023 Start Time: 0930 End Time: 0950 Facilitators: Cody Poole, LRT,CTRS Location: [address]   Group Topic: Communication, Team Building, Problem Solving  Goal Area(s) Addresses:  Patient will effectively work with peer towards shared goal.  Patient will identify skill used to make activity successful.  Patient will identify how skills used during activity can be used to reach post d/c goals.  Patient will identify a daily goal. Patient will cooperate in goals group conversation.   Intervention: Cup [employer] bands with attached strings enough for each group member, 10 or more cups  Activity: Patient(s) were given a set of solo cups, a rubber band, and some tied strings. The objective is to build a pyramid with the cups by only using the rubber band and string to move the cups. After the activity the patient(s) are LRT debriefed and discussed what strategies worked, what didn't, and what lessons they can take from the activity and use in life post discharge.   Education Areas: Pharmacist, community, Counsellor, Discharge Planning   Education Outcome: Acknowledges education   Affect/Mood: N/A   Participation Level: Did not attend    Clinical Observations/Individualized Feedback:      Plan: Continue to engage patient in RT group sessions 2-3x/week.   Cody Poole, LRT,CTRS 06/11/2023 11:30 AM

## 2023-06-11 NOTE — [facility] Group Notes (Signed)
 [facility] Group Notes:  (Nursing/MHT/Case Management/Adjunct)  Date:  06/11/2023  Time:  2000   Type of Therapy:   AA group  Participation Level:  Did Not Attend  Participation Quality:   Did not attend  Affect:   D id not attend  Cognitive:   Did not attend  Insight:  None  Engagement in Group:   Did not attend  Modes of Intervention:   Did not attend  Summary of Progress/Problems:  Cody Poole 06/11/2023, 8:53 PM

## 2023-06-11 NOTE — Progress Notes (Addendum)
     06/11/2023       12:53 PM   [name]   Type of Note: [facility]   Spoke with Lahoma Rocker in admissions who reports first available appt for phone screening is on 06/18/23. Lahoma Rocker would like pt to call and speak with her between 10:00 AM-11:00 AM (#[phone]).   Pt will be able to admit to their facility on 06/29/23. Pt does not have to go door-to-door and can arrive from outpatient basis. Information added to pts follow up to be provided at discharge. MD aware.  Pt's information (H&P, medication list, progress notes, face sheet) to be faxed to [email or web address] this afternoon.  1:25pm: referral information sent.   Signed:  Arlon Bleier, LCSW-A 06/11/2023  12:53 PM

## 2023-06-12 NOTE — Progress Notes (Signed)
   06/12/23 0900  Psych Admission Type (Psych Patients Only)  Admission Status Involuntary  Psychosocial Assessment  Patient Complaints Anxiety  Eye Contact Fair  Facial Expression Anxious  Affect Anxious  Speech Logical/coherent  Interaction Assertive  Motor Activity Fidgety  Appearance/Hygiene Unremarkable  Behavior Characteristics Cooperative  Mood Anxious  Thought Process  Coherency WDL  Content WDL  Delusions None reported or observed  Perception WDL  Hallucination None reported or observed  Judgment Poor  Confusion None  Danger to Self  Current suicidal ideation? Denies  Self-Injurious Behavior No self-injurious ideation or behavior indicators observed or expressed   Danger to Others  Danger to Others None reported or observed

## 2023-06-12 NOTE — Progress Notes (Signed)
 [facility] MD Progress Note  06/12/2023 4:33 PM Cody Poole  MRN:  [id number]  History of Present Illness: Cody Poole, 28 year old male with a history of ODD, drug overdose, polysubstance abuse, schizophrenia and suicidal ideation. The patient presented to [facility] on June 07, 2023 under an involuntary commitment (IVC) petition after contacting his probation officer endorsing suicidal ideation with a specific plan to locate a firearm and end his life. Chart review indicates that on May 31, 2023, during a probation office visit, the patient tested positive for cocaine, marijuana, methamphetamines, and unauthorized prescription medications. The patient was subsequently admitted to [facility] for further treatment and stabilization.    Chart reviewed today. Patient discussed at multidisciplinary team meeting. Nursing staff reported the patient denied ET but reports anxiety. He is compliant with routine medication regimen. He required as needed Hydroxyzine, Nicorette gum, Tylenol, naproxen, Robaxin, according to nursing record. Vital signs within defined limits.      Subjective: On assessment today, patient was received lying in bed, but he sat up and voiced significant irritability.  He stated that he feels as though he is being pushed out of the hospital without anywhere to go and that he just "wants to bite people."  He denies the desire to harm anyone, but expresses his frustration.  He does deny SI and AVH as well.  He says that he understands that he will be discharged on Monday, but he does not have anywhere to go unless his mom will allow him to stay for 14 days until he is able [facility].  He denies withdrawal symptoms today.  He describes his sleep as satisfactory and his appetite as "okay."  He becomes irritable with this Clinical research associate and ends the assessment.   According to social work, the patient has a scheduled phone  screening with [facility] on June 18, 2023, between 10:00 and 11:00 AM.  He will be able to admit to their facility on June 29, 2023, and is able to arrive on an outpatient basis.      Principal Problem: Schizophrenia (HCC) Diagnosis: Principal Problem:   Schizophrenia (HCC) Active Problems:   PTSD (post-traumatic stress disorder)  Total Time spent with patient: 30 minutes  Past Psychiatric History: See H&P  Past Medical History:  Past Medical History:  Diagnosis Date   Anxiety    Back pain    Cervical spondylosis    Depression    ETOH abuse    Heroin abuse (HCC)    Hypertension    IV drug abuse (HCC)    Methamphetamine use (HCC)    Opioid abuse with withdrawal (HCC)    Paranoid schizophrenia (HCC)    Polysubstance abuse (HCC)    Subdural hematoma caused by concussion (HCC) 05/08/2022   Fall/Admit to [facility] for observation   Tobacco abuse    History reviewed. No pertinent surgical history. Family History:  Family History  Family history unknown: Yes   Family Psychiatric  History: See H&P Social History:  Social History   Substance and Sexual Activity  Alcohol Use Not Currently     Social History   Substance and Sexual Activity  Drug Use Yes   Types: IV, Cocaine, Amphetamines, Heroin, Marijuana, Methamphetamines    Social History   Socioeconomic History   Marital status: Single    Spouse name: Not on file   Number of children: Not on file   Years of education: Not on file  Highest education level: Not on file  Occupational History   Not on file  Tobacco Use   Smoking status: Every Day   Smokeless tobacco: Never  Vaping Use   Vaping status: Never Used  Substance and Sexual Activity   Alcohol use: Not Currently   Drug use: Yes    Types: IV, Cocaine, Amphetamines, Heroin, Marijuana, Methamphetamines   Sexual activity: Not on file    Comment: heroin, crack cocaine  Other Topics Concern   Not on file  Social History Narrative   Not  on file   Social Drivers of Health   Financial Resource Strain: Not on file  Food Insecurity: Food Insecurity Present (06/08/2023)   Hunger Vital Sign    Worried About Running Out of Food in the Last Year: Often true    Ran Out of Food in the Last Year: Often true  Transportation Needs: Unmet Transportation Needs (06/08/2023)   PRAPARE - Administrator, Civil Service (Medical): Yes    Lack of Transportation (Non-Medical): Yes  Physical Activity: Not on file  Stress: Not on file  Social Connections: Not on file   Additional Social History:                           Current Medications: Current Facility-Administered Medications  Medication Dose Route Frequency Provider Last Rate Last Admin   acetaminophen (TYLENOL) tablet 650 mg  650 mg Oral Q6H PRN Ardis Hughs, NP   650 mg at 06/12/23 1607   alum & mag hydroxide-simeth (MAALOX/MYLANTA) 200-200-20 MG/5ML suspension 30 mL  30 mL Oral Q4H PRN Ardis Hughs, NP       buprenorphine-naloxone (SUBOXONE) 8-2 mg per SL tablet 2.5 tablet  2.5 tablet Sublingual Daily Ardis Hughs, NP   2.5 tablet at 06/12/23 0752   busPIRone (BUSPAR) tablet 10 mg  10 mg Oral Q8H Massengill, Harrold Donath, MD   10 mg at 06/12/23 1359   cloNIDine (CATAPRES) tablet 0.1 mg  0.1 mg Oral Q4H PRN Massengill, Harrold Donath, MD       dicyclomine (BENTYL) tablet 20 mg  20 mg Oral Q6H PRN Ardis Hughs, NP   20 mg at 06/10/23 1259   docusate sodium (COLACE) capsule 100 mg  100 mg Oral BID Massengill, Harrold Donath, MD   100 mg at 06/12/23 1608   hydrOXYzine (ATARAX) tablet 25 mg  25 mg Oral Q6H PRN Ardis Hughs, NP   25 mg at 06/11/23 1708   loperamide (IMODIUM) capsule 2-4 mg  2-4 mg Oral PRN Ardis Hughs, NP   4 mg at 06/10/23 1458   magnesium hydroxide (MILK OF MAGNESIA) suspension 30 mL  30 mL Oral Daily PRN Ardis Hughs, NP       methocarbamol (ROBAXIN) tablet 500 mg  500 mg Oral Q8H PRN Ardis Hughs, NP   500 mg at  06/11/23 1708   naproxen (NAPROSYN) tablet 500 mg  500 mg Oral BID PRN Ardis Hughs, NP   500 mg at 06/11/23 1708   nicotine (NICODERM CQ - dosed in mg/24 hours) patch 21 mg  21 mg Transdermal Daily Ardis Hughs, NP   21 mg at 06/12/23 0754   nicotine polacrilex (NICORETTE) gum 2 mg  2 mg Oral Q4H PRN Ardis Hughs, NP   2 mg at 06/12/23 1401   OLANZapine (ZYPREXA) injection 10 mg  10 mg Intramuscular TID PRN Ardis Hughs, NP  OLANZapine (ZYPREXA) injection 5 mg  5 mg Intramuscular TID PRN Ardis Hughs, NP       OLANZapine (ZYPREXA) tablet 5 mg  5 mg Oral QHS Massengill, Harrold Donath, MD   5 mg at 06/10/23 2052   OLANZapine zydis (ZYPREXA) disintegrating tablet 5 mg  5 mg Oral TID PRN Ardis Hughs, NP       ondansetron (ZOFRAN-ODT) disintegrating tablet 4 mg  4 mg Oral Q6H PRN Ardis Hughs, NP   4 mg at 06/10/23 1301    Lab Results:  Results for orders placed or performed during the hospital encounter of 06/08/23 (from the past 48 hours)  Lipid panel     Status: None   Collection Time: 06/11/23  6:20 AM  Result Value Ref Range   Cholesterol 137 0 - 200 mg/dL   Triglycerides 74 <301 mg/dL   HDL 51 >60 mg/dL   Total CHOL/HDL Ratio 2.7 RATIO   VLDL 15 0 - 40 mg/dL   LDL Cholesterol 71 0 - 99 mg/dL    Comment:        Total Cholesterol/HDL:CHD Risk Coronary Heart Disease Risk Table                     Men   Women  1/2 Average Risk   3.4   3.3  Average Risk       5.0   4.4  2 X Average Risk   9.6   7.1  3 X Average Risk  23.4   11.0        Use the calculated Patient Ratio above and the CHD Risk Table to determine the patient's CHD Risk.        ATP III CLASSIFICATION (LDL):  <100     mg/dL   Optimal  109-323  mg/dL   Near or Above                    Optimal  130-159  mg/dL   Borderline  557-322  mg/dL   High  >025     mg/dL   Very High Performed at [facility], [address], [place], [place] [address]   Hemoglobin A1c      Status: None   Collection Time: 06/11/23  6:20 AM  Result Value Ref Range   Hgb A1c MFr Bld 5.4 4.8 - 5.6 %    Comment: (NOTE) Pre diabetes:          5.7%-6.4%  Diabetes:              >6.4%  Glycemic control for   <7.0% adults with diabetes    Mean Plasma Glucose 108.28 mg/dL    Comment: Performed at [facility], [address], [place], [place] [address]    Blood Alcohol level:  Lab Results  Component Value Date   [facility] <10 06/08/2023   ETH <10 06/03/2023    Metabolic Disorder Labs: Lab Results  Component Value Date   HGBA1C 5.4 06/11/2023   MPG 108.28 06/11/2023   No results found for: "PROLACTIN" Lab Results  Component Value Date   CHOL 137 06/11/2023   TRIG 74 06/11/2023   HDL 51 06/11/2023   CHOLHDL 2.7 06/11/2023   VLDL 15 06/11/2023   LDLCALC 71 06/11/2023    Physical Findings: AIMS:  , ,  ,  ,    CIWA:    COWS:  COWS Total Score: 8  Musculoskeletal: Strength & Muscle Tone:  within normal limits Gait & Station: normal Patient leans: N/A  Psychiatric Specialty Exam:  Presentation  General Appearance:  Casual; Disheveled  Eye Contact: Fair  Speech: Clear and Coherent; Normal Rate  Speech Volume: Normal  Handedness: Right   Mood and Affect  Mood: Irritable  Affect: Congruent   Thought Process  Thought Processes: Coherent; Goal Directed  Descriptions of Associations:Intact  Orientation:Full (Time, Place and Person)  Thought Content:Logical; WDL  History of Schizophrenia/Schizoaffective disorder:Yes  Duration of Psychotic Symptoms:Greater than six months  Hallucinations:Hallucinations: None   Ideas of Reference:-- (Patient attended assessment before he can be asked)  Suicidal Thoughts:Suicidal Thoughts: No   Homicidal Thoughts:Homicidal Thoughts: No    Sensorium  Memory: Immediate Fair; Recent Fair  Judgment: Poor  Insight: Poor   Executive Functions  Concentration: Fair  Attention  Span: Fair  Recall: Fair  Fund of Knowledge: Fair  Language: Fair   Psychomotor Activity  Psychomotor Activity: Psychomotor Activity: Restlessness    Assets  Assets: Desire for Improvement; Physical Health; Social Support; Resilience   Sleep  Sleep: Sleep: Good     Physical Exam: Physical Exam Vitals and nursing note reviewed.  Constitutional:      General: He is not in acute distress. Cardiovascular:     Rate and Rhythm: Normal rate.     Pulses: Normal pulses.  Pulmonary:     Effort: No respiratory distress.  Neurological:     Mental Status: He is alert and oriented to person, place, and time.    Review of Systems  Constitutional:  Positive for diaphoresis.  Respiratory:  Negative for shortness of breath.   Cardiovascular:  Negative for chest pain and palpitations.  Gastrointestinal:  Positive for nausea.  Neurological:  Positive for dizziness. Negative for tingling and tremors.  Psychiatric/Behavioral:  Positive for depression, hallucinations and substance abuse. Negative for suicidal ideas. The patient is nervous/anxious. The patient does not have insomnia.    Blood pressure 119/88, pulse 87, temperature 97.8 F (36.6 C), temperature source Oral, resp. rate 16, height 6' (1.829 m), weight 86.2 kg, SpO2 99%. Body mass index is 25.77 kg/m.   Treatment Plan Summary: Cody Poole is a 28 y.o. male with a psychiatric history of schizophrenia, PTSD, and polysubstance use disorder who was admitted 2/2 SI with a plan.  While patient is no longer endorsing withdrawal symptoms, he does remain with irritable mood.  However, his irritation is secondary to not knowing where he will go upon discharge to [facility].  Patient denies SI, HI, and AVH today.  He does in the assessment prematurely.  Plan is still for discharge on Monday.   Daily contact with patient to assess and evaluate symptoms and progress in treatment and Medication  management  Diagnoses / Active Problems: Principal Problem:   Schizophrenia (HCC) Active Problems:   PTSD (post-traumatic stress disorder)   PLAN: Safety and Monitoring:             -- Involuntary admission to inpatient psychiatric unit for safety, stabilization and treatment             -- Daily contact with patient to assess and evaluate symptoms and progress in treatment             -- Patient's case to be discussed in multi-disciplinary team meeting             -- Observation Level : q15 minute checks             --  Vital signs:  q12 hours             -- Precautions: suicide, elopement, and assault   2. Treatment Plan:   Medications:  -Continue olanzapine tablet 5 mg, oral, daily at bedtime, mood, psychosis.  Patient refused last night -Continue BuSpar tablet 10 mg three times daily, anxiety. -Continue Colace capsule 100 mg, oral, 2 times daily, constipation  -Continue COWS; most recent score 8 -Continue Suboxone 8-2 mg per SL tablet, 2.5 tablet, sublingual, daily, opioid dependence. -Continue nicotine patch 21mg /24 hours ordered, tobacco use disorder   -Previously discontinued Abilify tablet 5 mg, oral, daily.   Continue Detox Protocol Medications -Bentyl tablet 20 mg, oral, every 6 hours, as needed, spasms, abdominal cramping. -Imodium 2 to 4 Mg, Oral, As Needed, Diarrhea, or Loose Stools -Continue naproxen tablet 500 mg, oral, 2 times daily as needed, aching, pain, or discomfort -Robaxin tablet 500 mg, oral, every 8 hours, as needed, muscle spasms. -Zofran-ODT 4 mg, oral, every 6 hours as needed, nausea, vomiting.   Continue BH Agitation Protocol -- Olanzapine disintegrating tablet 5 mg, oral, 3 times daily as needed, mild agitation                                     OR  -- Olanzapine injection 5 mg, IM, 3 times daily as needed, moderate agitation                                     OR -- Olanzapine injection 10 mg, IM, 3 times daily as needed, severe agitation      Continue As Needed Medications --Hydroxyzine 25 mg oral, 3 times daily as needed, anxiety --Trazodone 50 mg, oral, daily at bedtime as needed, sleep  --Tylenol 650 mg every 6 hours, as needed, mild pain, fever --Maalox/Mylanta 30 mL oral every 4 hours as needed, indigestion --Milk of Magnesia 30 mL oral daily as needed, mild constipation -Continue Nicorette gum 2 mg, oral, every 4 hours as needed, smoking cessation     --  The risks/benefits/side-effects/alternatives to this medication were discussed in detail with the patient and time was given for questions. The patient consents to medication trial.  -- FDA             -- Metabolic profile and EKG monitoring obtained while on an atypical antipsychotic (BMI: Lipid Panel: HbgA1c: QTc:)  -- Smoking cessation encouraged             -- Encouraged patient to participate in unit milieu and in scheduled group therapies              -- Short Term Goals: Ability to identify changes in lifestyle to reduce recurrence of condition will improve, Ability to verbalize feelings will improve, Ability to disclose and discuss suicidal ideas, Ability to demonstrate self-control will improve, Ability to identify and develop effective coping behaviors will improve, Ability to maintain clinical measurements within normal limits will improve, Compliance with prescribed medications will improve, and Ability to identify triggers associated with substance abuse/mental health issues will improve             -- Long Term Goals: Improvement in symptoms so as ready for discharge        EKG reviewed: QT/QTcB 352/393 ms   Urine drug screen: Positive for amphetamines, cocaine, marijuana, methamphetamine, morphine, Suboxone.  Labs reviewed: Glucose elevated at 133. Albumin slightly low at 3.4. AST elevated at 51 (improved since 06/03/2023 69). Albumin slightly low at 3.4. Hemoglobin slightly low at 12.9. Hemoglobin A1c and lipid panel within range.    CT Renal Stone  Study on 06/03/2023 at [facility] IMPRESSION: 1. No acute findings in the abdomen or pelvis. 2. Large stool volume. Imaging features could be compatible with constipation in the appropriate clinical setting.     3. Discharge Planning:              -- Social work and case management to assist with discharge planning and identification of hospital follow-up needs prior to discharge             -- Estimated date of discharge: Monday, 3/3             -- Discharge Concerns: Need to establish a safety plan; Medication compliance and effectiveness             -- Discharge Goals: Return home with outpatient referrals for mental health follow-up including medication management/psychotherapy      I certify that inpatient services furnished can reasonably be expected to improve the patient's condition.      Lamar Sprinkles, MD 06/12/2023, 4:33 PM

## 2023-06-12 NOTE — Plan of Care (Signed)
   Problem: Education: Goal: Emotional status will improve Outcome: Progressing Goal: Mental status will improve Outcome: Progressing   Problem: Activity: Goal: Interest or engagement in activities will improve Outcome: Progressing   Problem: Coping: Goal: Ability to demonstrate self-control will improve Outcome: Progressing

## 2023-06-12 NOTE — Group Note (Signed)
 Date:  06/12/2023 Time:  4:22 PM  Group Topic/Focus:  Goals Group:   The focus of this group is to help patients establish daily goals to achieve during treatment and discuss how the patient can incorporate goal setting into their daily lives to aide in recovery. Orientation:   The focus of this group is to educate the patient on the purpose and policies of crisis stabilization and provide a format to answer questions about their admission.  The group details unit policies and expectations of patients while admitted.    Participation Level:  Did Not Attend  Participation Quality:   n/a  Affect:   n/a  Cognitive:   n/a  Insight: None  Engagement in Group:   n/a  Modes of Intervention:   n/a  Additional Comments:   Did not attend.   Edmund Hilda Bill Mcvey 06/12/2023, 4:22 PM

## 2023-06-12 NOTE — [facility] Group Notes (Signed)
 Psychoeducational Group Note  Date:  06/12/2023 Time:  2000  Group Topic/Focus:  Wrap up group  Participation Level: Did Not Attend  Participation Quality:  Not Applicable  Affect:  Not Applicable  Cognitive:  Not Applicable  Insight:  Not Applicable  Engagement in Group: Not Applicable  Additional Comments:  Did not attend.   Johann Capers S 06/12/2023, 9:15 PM

## 2023-06-12 NOTE — Plan of Care (Signed)
   Problem: Education: Goal: Emotional status will improve Outcome: Progressing   Problem: Education: Goal: Mental status will improve Outcome: Progressing

## 2023-06-12 NOTE — Progress Notes (Signed)
   06/12/23 0000  Psych Admission Type (Psych Patients Only)  Admission Status Involuntary  Psychosocial Assessment  Patient Complaints Isolation;Substance abuse  Eye Contact Fair  Facial Expression Anxious  Affect Appropriate to circumstance  Speech Logical/coherent  Interaction Assertive  Motor Activity Fidgety  Appearance/Hygiene Unremarkable  Behavior Characteristics Cooperative  Mood Depressed;Anxious  Thought Process  Coherency WDL  Content WDL  Delusions None reported or observed  Perception WDL  Hallucination None reported or observed  Judgment Poor  Confusion None  Danger to Self  Current suicidal ideation? Denies  Self-Injurious Behavior No self-injurious ideation or behavior indicators observed or expressed   Agreement Not to Harm Self Yes  Description of Agreement verbal  Danger to Others  Danger to Others None reported or observed

## 2023-06-12 NOTE — Progress Notes (Signed)
   06/12/23 2105  Psych Admission Type (Psych Patients Only)  Admission Status Involuntary  Psychosocial Assessment  Patient Complaints Anxiety;Substance abuse  Eye Contact Fair  Facial Expression Anxious  Affect Anxious  Speech Logical/coherent  Interaction Assertive  Motor Activity Fidgety  Appearance/Hygiene Unremarkable  Behavior Characteristics Appropriate to situation  Mood Anxious  Thought Process  Coherency WDL  Content WDL  Delusions None reported or observed  Perception WDL  Hallucination None reported or observed  Judgment Poor  Confusion None  Danger to Self  Current suicidal ideation? Denies  Danger to Others  Danger to Others None reported or observed

## 2023-06-12 NOTE — Plan of Care (Signed)
   Problem: Coping: Goal: Ability to verbalize frustrations and anger appropriately will improve Outcome: Progressing   Problem: Coping: Goal: Ability to demonstrate self-control will improve Outcome: Progressing   Problem: Safety: Goal: Periods of time without injury will increase Outcome: Progressing

## 2023-06-13 NOTE — Group Note (Unsigned)
 Date:  06/13/2023 Time:  10:29 AM  Group Topic/Focus:  Goals Group:   The focus of this group is to help patients establish daily goals to achieve during treatment and discuss how the patient can incorporate goal setting into their daily lives to aide in recovery.     Participation Level:  {BHH PARTICIPATION ZOXWR:60454}  Participation Quality:  {BHH PARTICIPATION QUALITY:22265}  Affect:  {BHH AFFECT:22266}  Cognitive:  {BHH COGNITIVE:22267}  Insight: {BHH Insight2:20797}  Engagement in Group:  {BHH ENGAGEMENT IN UJWJX:91478}  Modes of Intervention:  {BHH MODES OF INTERVENTION:22269}  Additional Comments:  ***  Estill Dooms 06/13/2023, 10:29 AM

## 2023-06-13 NOTE — Progress Notes (Signed)
   06/13/23 0547  15 Minute Checks  Location Bedroom  Visual Appearance Calm  Behavior Sleeping  Sleep (Behavioral Health Patients Only)  Calculate sleep? (Click Yes once per 24 hr at 0600 safety check) Yes  Documented sleep last 24 hours 8.5

## 2023-06-13 NOTE — Group Note (Signed)
 Date:  06/13/2023 Time:  2:29 PM  Group Topic/Focus:  Dimensions of Wellness:   The focus of this group is to introduce the topic of wellness and discuss the role each dimension of wellness plays in total health.    Participation Level:  Did Not Attend   Randon Goldsmith Dicky Boer 06/13/2023, 2:29 PM

## 2023-06-13 NOTE — Group Note (Signed)
 Date:  06/13/2023 Time:  11:52 AM  Group Topic/Focus:  Goals Group:   The focus of this group is to help patients establish daily goals to achieve during treatment and discuss how the patient can incorporate goal setting into their daily lives to aide in recovery.    Participation Level:  Did Not Attend  Participation Quality:   did not attend  Affect:   Did not attend  Cognitive:   did not attend  Insight: None  Engagement in Group:   did not attend  Modes of Intervention:   did not attend  Additional Comments:  did not attend  Estill Dooms 06/13/2023, 11:52 AM

## 2023-06-13 NOTE — BHH Group Notes (Signed)
 BHH Group Notes:  (Nursing/MHT/Case Management/Adjunct)  Date:  06/13/2023  Time:  8:52 PM  Type of Therapy:   Wrap-up group  Participation Level:  Active  Participation Quality:  Appropriate  Affect:  Appropriate  Cognitive:  Appropriate  Insight:  Appropriate  Engagement in Group:  Engaged  Modes of Intervention:  Education  Summary of Progress/Problems: Pt goal to work on D/C plan. Rated day 10/10.  Cody Poole 06/13/2023, 8:52 PM

## 2023-06-13 NOTE — Plan of Care (Signed)
   Problem: Education: Goal: Emotional status will improve Outcome: Progressing Goal: Mental status will improve Outcome: Progressing   Problem: Activity: Goal: Interest or engagement in activities will improve Outcome: Progressing

## 2023-06-13 NOTE — Progress Notes (Signed)
 [name] MD Progress Note  06/13/2023 9:01 AM Cody Poole  MRN:  [id number]  History of Present Illness: Cody Poole, 28 year old male with a history of ODD, drug overdose, polysubstance abuse, schizophrenia and suicidal ideation. The patient presented to [facility] on June 07, 2023 under an involuntary commitment (IVC) petition after contacting his probation officer endorsing suicidal ideation with a specific plan to locate a firearm and end his life. Chart review indicates that on May 31, 2023, during a probation office visit, the patient tested positive for cocaine, marijuana, methamphetamines, and unauthorized prescription medications. The patient was subsequently admitted to [facility] for further treatment and stabilization.    Chart reviewed today. Patient discussed at multidisciplinary team meeting. Nursing staff reported the patient endorsed improvement of symptoms and had COWS score of 6.  He slept 8.25 hours last night.  He is compliant with routine medication regimen. He required as needed Hydroxyzine, Nicorette gum and Tylenol according to nursing record. Vital signs within defined limits.      Subjective: On assessment today, patient was r seen in the day room and much more pleasant.  He discussed living arrangements with his mom and sister, who both informed him that they would allow him to stay with them until he is caring services admission.  He is hopeful about maintaining sobriety and attending the program.  He denies acute concerns or complaints today.  He says that he understands that he will be discharged on Monday and feels ready.  He denies withdrawal symptoms today.  He describes his sleep as satisfactory and his appetite as "good."  He reports compliance with all scheduled medications except Colace and nicotine patch over the past 24 hours and denies adverse effects.  He denies SI, HI, and AVH today.   According to  social work, the patient has a scheduled phone screening with [facility] on June 18, 2023, between 10:00 and 11:00 AM.  He will be able to admit to their facility on June 29, 2023, and is able to arrive on an outpatient basis.      Principal Problem: Schizophrenia (HCC) Diagnosis: Principal Problem:   Schizophrenia (HCC) Active Problems:   PTSD (post-traumatic stress disorder)  Total Time spent with patient: 30 minutes  Past Psychiatric History: See H&P  Past Medical History:  Past Medical History:  Diagnosis Date   Anxiety    Back pain    Cervical spondylosis    Depression    ETOH abuse    Heroin abuse (HCC)    Hypertension    IV drug abuse (HCC)    Methamphetamine use (HCC)    Opioid abuse with withdrawal (HCC)    Paranoid schizophrenia (HCC)    Polysubstance abuse (HCC)    Subdural hematoma caused by concussion (HCC) 05/08/2022   Fall/Admit to [facility] for observation   Tobacco abuse    History reviewed. No pertinent surgical history. Family History:  Family History  Family history unknown: Yes   Family Psychiatric  History: See H&P Social History:  Social History   Substance and Sexual Activity  Alcohol Use Not Currently     Social History   Substance and Sexual Activity  Drug Use Yes   Types: IV, Cocaine, Amphetamines, Heroin, Marijuana, Methamphetamines    Social History   Socioeconomic History   Marital status: Single    Spouse name: Not on file   Number of children: Not on file   Years of education: Not on file  Highest education level: Not on file  Occupational History   Not on file  Tobacco Use   Smoking status: Every Day   Smokeless tobacco: Never  Vaping Use   Vaping status: Never Used  Substance and Sexual Activity   Alcohol use: Not Currently   Drug use: Yes    Types: IV, Cocaine, Amphetamines, Heroin, Marijuana, Methamphetamines   Sexual activity: Not on file    Comment: heroin, crack cocaine  Other Topics Concern    Not on file  Social History Narrative   Not on file   Social Drivers of Health   Financial Resource Strain: Not on file  Food Insecurity: Food Insecurity Present (06/08/2023)   Hunger Vital Sign    Worried About Running Out of Food in the Last Year: Often true    Ran Out of Food in the Last Year: Often true  Transportation Needs: Unmet Transportation Needs (06/08/2023)   PRAPARE - Administrator, Civil Service (Medical): Yes    Lack of Transportation (Non-Medical): Yes  Physical Activity: Not on file  Stress: Not on file  Social Connections: Not on file   Additional Social History:                           Current Medications: Current Facility-Administered Medications  Medication Dose Route Frequency Provider Last Rate Last Admin   acetaminophen (TYLENOL) tablet 650 mg  650 mg Oral Q6H PRN Ardis Hughs, NP   650 mg at 06/12/23 1607   alum & mag hydroxide-simeth (MAALOX/MYLANTA) 200-200-20 MG/5ML suspension 30 mL  30 mL Oral Q4H PRN Ardis Hughs, NP       buprenorphine-naloxone (SUBOXONE) 8-2 mg per SL tablet 2.5 tablet  2.5 tablet Sublingual Daily Ardis Hughs, NP   2.5 tablet at 06/13/23 0815   busPIRone (BUSPAR) tablet 10 mg  10 mg Oral Q8H Massengill, Harrold Donath, MD   10 mg at 06/13/23 0641   cloNIDine (CATAPRES) tablet 0.1 mg  0.1 mg Oral Q4H PRN Massengill, Harrold Donath, MD       dicyclomine (BENTYL) tablet 20 mg  20 mg Oral Q6H PRN Ardis Hughs, NP   20 mg at 06/10/23 1259   docusate sodium (COLACE) capsule 100 mg  100 mg Oral BID Phineas Inches, MD   100 mg at 06/13/23 0815   hydrOXYzine (ATARAX) tablet 25 mg  25 mg Oral Q6H PRN Ardis Hughs, NP   25 mg at 06/11/23 1708   loperamide (IMODIUM) capsule 2-4 mg  2-4 mg Oral PRN Ardis Hughs, NP   4 mg at 06/10/23 1458   magnesium hydroxide (MILK OF MAGNESIA) suspension 30 mL  30 mL Oral Daily PRN Ardis Hughs, NP       methocarbamol (ROBAXIN) tablet 500 mg  500 mg Oral  Q8H PRN Ardis Hughs, NP   500 mg at 06/11/23 1708   naproxen (NAPROSYN) tablet 500 mg  500 mg Oral BID PRN Ardis Hughs, NP   500 mg at 06/11/23 1708   nicotine (NICODERM CQ - dosed in mg/24 hours) patch 21 mg  21 mg Transdermal Daily Ardis Hughs, NP   21 mg at 06/12/23 0754   nicotine polacrilex (NICORETTE) gum 2 mg  2 mg Oral Q4H PRN Ardis Hughs, NP   2 mg at 06/12/23 1401   OLANZapine (ZYPREXA) injection 10 mg  10 mg Intramuscular TID PRN Ardis Hughs, NP  OLANZapine (ZYPREXA) injection 5 mg  5 mg Intramuscular TID PRN Ardis Hughs, NP       OLANZapine (ZYPREXA) tablet 5 mg  5 mg Oral QHS Massengill, Nathan, MD   5 mg at 06/12/23 2105   OLANZapine zydis (ZYPREXA) disintegrating tablet 5 mg  5 mg Oral TID PRN Ardis Hughs, NP       ondansetron (ZOFRAN-ODT) disintegrating tablet 4 mg  4 mg Oral Q6H PRN Ardis Hughs, NP   4 mg at 06/10/23 1301    Lab Results:  No results found for this or any previous visit (from the past 48 hours).   Blood Alcohol level:  Lab Results  Component Value Date   ETH <10 06/08/2023   ETH <10 06/03/2023    Metabolic Disorder Labs: Lab Results  Component Value Date   HGBA1C 5.4 06/11/2023   MPG 108.28 06/11/2023   No results found for: "PROLACTIN" Lab Results  Component Value Date   CHOL 137 06/11/2023   TRIG 74 06/11/2023   HDL 51 06/11/2023   CHOLHDL 2.7 06/11/2023   VLDL 15 06/11/2023   LDLCALC 71 06/11/2023    Physical Findings: AIMS:  , ,  ,  ,    CIWA:    COWS:  COWS Total Score: 0  Musculoskeletal: Strength & Muscle Tone: within normal limits Gait & Station: normal Patient leans: N/A  Psychiatric Specialty Exam:  Presentation  General Appearance:  Casual; Disheveled  Eye Contact: Fair  Speech: Clear and Coherent; Normal Rate  Speech Volume: Normal  Handedness: Right   Mood and Affect  Mood: Irritable  Affect: Congruent   Thought Process  Thought  Processes: Coherent; Goal Directed  Descriptions of Associations:Intact  Orientation:Full (Time, Place and Person)  Thought Content:Logical; WDL  History of Schizophrenia/Schizoaffective disorder:Yes  Duration of Psychotic Symptoms:Greater than six months  Hallucinations:Hallucinations: None   Ideas of Reference: Denies-- (Patient attended assessment before he can be asked)  Suicidal Thoughts:Suicidal Thoughts: No   Homicidal Thoughts:Homicidal Thoughts: No    Sensorium  Memory: Immediate Fair; Recent Fair  Judgment: Poor  Insight: Poor   Executive Functions  Concentration: Fair  Attention Span: Fair  Recall: Fair  Fund of Knowledge: Fair  Language: Fair   Psychomotor Activity  Psychomotor Activity: Psychomotor Activity: Restlessness    Assets  Assets: Desire for Improvement; Physical Health; Social Support; Resilience   Sleep  Sleep: Sleep: Good     Physical Exam: Physical Exam Vitals and nursing note reviewed.  Constitutional:      General: He is not in acute distress. Cardiovascular:     Rate and Rhythm: Normal rate.     Pulses: Normal pulses.  Pulmonary:     Effort: No respiratory distress.  Neurological:     Mental Status: He is alert and oriented to person, place, and time.    Review of Systems  Constitutional:  Negative for diaphoresis.  Respiratory:  Negative for shortness of breath.   Cardiovascular:  Negative for chest pain and palpitations.  Gastrointestinal:  Negative for nausea.  Musculoskeletal:  Positive for myalgias.  Neurological:  Negative for dizziness, tingling and tremors.  Psychiatric/Behavioral:  Positive for substance abuse. Negative for depression, hallucinations and suicidal ideas. The patient is not nervous/anxious and does not have insomnia.    Blood pressure 118/87, pulse 89, temperature 98.3 F (36.8 C), temperature source Oral, resp. rate 18, height 6' (1.829 m), weight 86.2 kg, SpO2 98%.  Body mass index is 25.77 kg/m.   Treatment Plan  Summary: Sevon Rotert is a 28 y.o. male with a psychiatric history of schizophrenia, PTSD, and polysubstance use disorder who was admitted 2/2 SI with a plan.  Patient is much more pleasant today and feels motivated for discharge on tomorrow.  He denies SI, HI, and AVH today.    Plan is still for discharge tomorrow.   Daily contact with patient to assess and evaluate symptoms and progress in treatment and Medication management  Diagnoses / Active Problems: Principal Problem:   Schizophrenia (HCC) Active Problems:   PTSD (post-traumatic stress disorder)   PLAN: Safety and Monitoring:             -- Involuntary admission to inpatient psychiatric unit for safety, stabilization and treatment             -- Daily contact with patient to assess and evaluate symptoms and progress in treatment             -- Patient's case to be discussed in multi-disciplinary team meeting             -- Observation Level : q15 minute checks             -- Vital signs:  q12 hours             -- Precautions: suicide, elopement, and assault   2. Treatment Plan:   Medications:  -Continue olanzapine tablet 5 mg, oral, daily at bedtime, mood, psychosis.  Patient refused last night -Continue BuSpar tablet 10 mg three times daily, anxiety. -Continue Colace capsule 100 mg, oral, 2 times daily, constipation  -Continue COWS; most recent score 6 -Continue Suboxone 8-2 mg per SL tablet, 2.5 tablet, sublingual, daily, opioid dependence. -Continue nicotine patch 21mg /24 hours ordered, tobacco use disorder   -Previously discontinued Abilify tablet 5 mg, oral, daily.   Discontinue Detox Protocol Medications per standard protocol  -Bentyl tablet 20 mg, oral, every 6 hours, as needed, spasms, abdominal cramping. -Imodium 2 to 4 Mg, Oral, As Needed, Diarrhea, or Loose Stools -Continue naproxen tablet 500 mg, oral, 2 times daily as needed, aching, pain, or  discomfort -Robaxin tablet 500 mg, oral, every 8 hours, as needed, muscle spasms. -Zofran-ODT 4 mg, oral, every 6 hours as needed, nausea, vomiting.   Continue BH Agitation Protocol -- Olanzapine disintegrating tablet 5 mg, oral, 3 times daily as needed, mild agitation                                     OR  -- Olanzapine injection 5 mg, IM, 3 times daily as needed, moderate agitation                                     OR -- Olanzapine injection 10 mg, IM, 3 times daily as needed, severe agitation     Continue As Needed Medications --Hydroxyzine 25 mg oral, 3 times daily as needed, anxiety --Trazodone 50 mg, oral, daily at bedtime as needed, sleep  --Tylenol 650 mg every 6 hours, as needed, mild pain, fever --Maalox/Mylanta 30 mL oral every 4 hours as needed, indigestion --Milk of Magnesia 30 mL oral daily as needed, mild constipation -Continue Nicorette gum 2 mg, oral, every 4 hours as needed, smoking cessation     --  The risks/benefits/side-effects/alternatives to this medication were discussed in detail with the patient and time  was given for questions. The patient consents to medication trial.  -- FDA             -- Metabolic profile and EKG monitoring obtained while on an atypical antipsychotic (BMI: Lipid Panel: HbgA1c: QTc:)  -- Smoking cessation encouraged             -- Encouraged patient to participate in unit milieu and in scheduled group therapies              -- Short Term Goals: Ability to identify changes in lifestyle to reduce recurrence of condition will improve, Ability to verbalize feelings will improve, Ability to disclose and discuss suicidal ideas, Ability to demonstrate self-control will improve, Ability to identify and develop effective coping behaviors will improve, Ability to maintain clinical measurements within normal limits will improve, Compliance with prescribed medications will improve, and Ability to identify triggers associated with substance abuse/mental  health issues will improve             -- Long Term Goals: Improvement in symptoms so as ready for discharge        EKG reviewed: QT/QTcB 352/393 ms   Urine drug screen: Positive for amphetamines, cocaine, marijuana, methamphetamine, morphine, Suboxone.   Labs reviewed: Glucose elevated at 133. Albumin slightly low at 3.4. AST elevated at 51 (improved since 06/03/2023 69). Albumin slightly low at 3.4. Hemoglobin slightly low at 12.9. Hemoglobin A1c and lipid panel within range.    CT Renal Stone [facility]03[facility] IMPRESSION: 1. No acute findings in the abdomen or pelvis. 2. Large stool volume. Imaging features could be compatible with constipation in the appropriate clinical setting.     3. Discharge Planning:              -- Social work and case management to assist with discharge planning and identification of hospital follow-up needs prior to discharge             -- Estimated date of discharge: Monday, 3/3             -- Discharge Concerns: Need to establish a safety plan; Medication compliance and effectiveness             -- Discharge Goals: Return home with outpatient referrals for mental health follow-up including medication management/psychotherapy      I certify that inpatient services furnished can reasonably be expected to improve the patient's condition.      Lamar Sprinkles, MD 06/13/2023, 9:01 AM

## 2023-06-13 NOTE — Plan of Care (Signed)

## 2023-06-13 NOTE — Progress Notes (Signed)
   06/13/23 2105  Psych Admission Type (Psych Patients Only)  Admission Status Involuntary  Psychosocial Assessment  Patient Complaints None  Eye Contact Fair  Facial Expression Anxious  Affect Anxious  Speech Logical/coherent  Interaction Assertive  Motor Activity Fidgety  Appearance/Hygiene Unremarkable  Behavior Characteristics Cooperative;Appropriate to situation  Mood Anxious  Thought Process  Coherency WDL  Content WDL  Delusions None reported or observed  Perception WDL  Hallucination None reported or observed  Judgment Poor  Confusion None  Danger to Self  Current suicidal ideation? Denies  Danger to Others  Danger to Others None reported or observed

## 2023-06-14 DIAGNOSIS — F203 Undifferentiated schizophrenia: Secondary | ICD-10-CM | POA: Diagnosis not present

## 2023-06-14 MED ORDER — BUSPIRONE HCL 10 MG PO TABS
10.0000 mg | ORAL_TABLET | Freq: Three times a day (TID) | ORAL | 0 refills | Status: DC
Start: 2023-06-14 — End: 2023-06-14

## 2023-06-14 MED ORDER — NICOTINE 21 MG/24HR TD PT24: 21.0000 mg | MEDICATED_PATCH | Freq: Every day | TRANSDERMAL | 0 refills | Status: DC

## 2023-06-14 MED ORDER — BUSPIRONE HCL 10 MG PO TABS: 10.0000 mg | ORAL_TABLET | Freq: Three times a day (TID) | ORAL | 0 refills | Status: AC

## 2023-06-14 MED ORDER — OLANZAPINE 5 MG PO TABS: 5.0000 mg | ORAL_TABLET | Freq: Every day | ORAL | 0 refills | Status: DC

## 2023-06-14 MED ORDER — NICOTINE POLACRILEX 2 MG MT GUM: 2.0000 mg | CHEWING_GUM | OROMUCOSAL | 0 refills | Status: DC | PRN

## 2023-06-14 MED ORDER — NALOXONE HCL 4 MG/0.1ML NA LIQD: 1.0000 | Freq: Once | NASAL | 2 refills | Status: AC

## 2023-06-14 NOTE — Group Note (Signed)
 Date:  06/14/2023 Time:  1:00 PM  Group Topic/Focus:  Goals Group:   The focus of this group is to help patients establish daily goals to achieve during treatment and discuss how the patient can incorporate goal setting into their daily lives to aide in recovery. Orientation:   The focus of this group is to educate the patient on the purpose and policies of crisis stabilization and provide a format to answer questions about their admission.  The group details unit policies and expectations of patients while admitted.    Participation Level:  Did Not Attend  Participation Quality:   n/a  Affect:   n/a  Cognitive:   n/a  Insight: None  Engagement in Group:   n/a  Modes of Intervention:   n/a  Additional Comments:   Did not attend.  Edmund Hilda Hajra Port 06/14/2023, 1:00 PM

## 2023-06-14 NOTE — BHH Suicide Risk Assessment (Signed)
 BHH INPATIENT:  Family/Significant Other Suicide Prevention Education  Suicide Prevention Education:  Education Completed; Brentt Fread (mom) [phone],  (name of family member/significant other) has been identified by the patient as the family member/significant other with whom the patient will be residing, and identified as the person(s) who will aid the patient in the event of a mental health crisis (suicidal ideations/suicide attempt).  With written consent from the patient, the family member/significant other has been provided the following suicide prevention education, prior to the and/or following the discharge of the patient.  Mom said that they don't have any guns or weapons.  Mom said that she will secure medications and sharp objects (such as knives and scissors).  Mom doesn't have any safety concerns about patient coming to her home.  Mom lives alone.  Her address is:  [address], [address], [place], [place]).  The suicide prevention education provided includes the following: Suicide risk factors Suicide prevention and interventions National Suicide Hotline telephone number [facility] assessment telephone number [facility] Emergency Assistance 911 [facility] and/or Residential Mobile Crisis Unit telephone number  Request made of family/significant other to: Remove weapons (e.g., guns, rifles, knives), all items previously/currently identified as safety concern.   Remove drugs/medications (over-the-counter, prescriptions, illicit drugs), all items previously/currently identified as a safety concern.  The family member/significant other verbalizes understanding of the suicide prevention education information provided.  The family member/significant other agrees to remove the items of safety concern listed above.  Yuriel Lopezmartinez O Kalliopi Coupland 06/14/2023, 11:08 AM

## 2023-06-14 NOTE — Progress Notes (Signed)
   06/14/23 0900  Psych Admission Type (Psych Patients Only)  Admission Status Involuntary  Psychosocial Assessment  Patient Complaints None  Eye Contact Fair  Facial Expression Anxious  Affect Anxious  Speech Logical/coherent  Interaction Assertive  Motor Activity Fidgety  Appearance/Hygiene Unremarkable  Behavior Characteristics Cooperative;Appropriate to situation  Mood Pleasant  Thought Process  Coherency WDL  Content WDL  Delusions None reported or observed  Perception WDL  Hallucination None reported or observed  Judgment WDL  Confusion None  Danger to Self  Current suicidal ideation? Denies  Agreement Not to Harm Self Yes  Description of Agreement verbal  Danger to Others  Danger to Others None reported or observed

## 2023-06-14 NOTE — Group Note (Signed)
 Recreation Therapy Group Note   Group Topic:Communication  Group Date: 06/14/2023 Start Time: 0934 End Time: 1002 Facilitators: Dimitrios Balestrieri-McCall, LRT,CTRS Location: [address] Dayroom   Group Topic: Communication, Problem Solving   Goal Area(s) Addresses:  Patient will effectively listen to complete activity.  Patient will identify communication skills used to make activity successful.  Patient will identify how skills used during activity can be used to reach post d/c goals.    Intervention: Building surveyor Activity - Geometric pattern cards, pencils, blank paper    Activity: Geometric Drawings.  Three volunteers from the peer group will be shown an abstract picture with a particular arrangement of geometrical shapes.  Each round, one 'speaker' will describe the pattern, as accurately as possible without revealing the image to the group.  The remaining group members will listen and draw the picture to reflect how it is described to them. Patients with the role of 'listener' cannot ask clarifying questions but, may request that the speaker repeat a direction. Once the drawings are complete, the presenter will show the rest of the group the picture and compare how close each person came to drawing the picture. LRT will facilitate a post-activity discussion regarding effective communication and the importance of planning, listening, and asking for clarification in daily interactions with others.   Education: Environmental consultant, Active listening, Support systems, Discharge planning  Education Outcome: Acknowledges understanding/In group clarification offered/Needs additional education.    Affect/Mood: N/A   Participation Level: Did not attend    Clinical Observations/Individualized Feedback:     Plan: Continue to engage patient in RT group sessions 2-3x/week.   Cambryn Charters-McCall, LRT,CTRS 06/14/2023 12:01 PM

## 2023-06-14 NOTE — Plan of Care (Signed)
  Problem: Education: Goal: Knowledge of [facility] Education information/materials will improve Outcome: Adequate for Discharge Goal: Emotional status will improve Outcome: Adequate for Discharge Goal: Mental status will improve Outcome: Adequate for Discharge Goal: Verbalization of understanding the information provided will improve Outcome: Adequate for Discharge   Problem: Activity: Goal: Interest or engagement in activities will improve Outcome: Adequate for Discharge Goal: Sleeping patterns will improve Outcome: Adequate for Discharge   

## 2023-06-14 NOTE — [facility] Suicide Risk Assessment (Signed)
 Suicide Risk Assessment  Discharge Assessment    [facility] Discharge Suicide Risk Assessment   Principal Problem: Schizophrenia [facility]) Discharge Diagnoses: Principal Problem:   Schizophrenia (HCC) Active Problems:   PTSD (post-traumatic stress disorder)  Reason for admission:   Cody Poole, 28 year old male with a history of ODD, drug overdose, polysubstance abuse, schizophrenia and suicidal ideation. The patient presented to [facility] on June 07, 2023 under an involuntary commitment (IVC) petition after contacting his probation officer endorsing suicidal ideation with a specific plan to locate a firearm and end his life. The patient was admitted to [facility] for further treatment and stabilization.   Total Time spent with patient: 30 minutes  Musculoskeletal: Strength & Muscle Tone: within normal limits Gait & Station: normal Patient leans: N/A  Psychiatric Specialty Exam  Presentation  General Appearance:  Appropriate for Environment; Casual; Fairly Groomed  Eye Contact: Good  Speech: Clear and Coherent; Normal Rate  Speech Volume: Normal  Handedness: Right  Mood and Affect  Mood: Euthymic  Duration of Depression Symptoms: Greater than two weeks  Affect: Appropriate; Congruent  Thought Process  Thought Processes: Coherent  Descriptions of Associations:Intact  Orientation:Full (Time, Place and Person)  Thought Content:Logical  History of Schizophrenia/Schizoaffective disorder:No  Duration of Psychotic Symptoms:Greater than six months  Hallucinations:Hallucinations: None Description of Auditory Hallucinations: Denies Description of Visual Hallucinations: Denies  Ideas of Reference:None  Suicidal Thoughts:Suicidal Thoughts: No SI Active Intent and/or Plan: -- (Denies)  Homicidal Thoughts:Homicidal Thoughts: No  Sensorium  Memory: Immediate Good; Recent  Good  Judgment: Good  Insight: Good  Executive Functions  Concentration: Good  Attention Span: Good  Recall: Fair  Fund of Knowledge: Fair  Language: Good  Psychomotor Activity  Psychomotor Activity: Psychomotor Activity: Normal  Assets  Assets: Desire for Improvement; Communication Skills; Resilience  Sleep  Sleep: Sleep: Good Number of Hours of Sleep: 8.25  Physical Exam: Physical Exam Vitals and nursing note reviewed.  Constitutional:      Appearance: He is normal weight.  HENT:     Head: Normocephalic.     Nose: Nose normal.     Mouth/Throat:     Mouth: Mucous membranes are moist.     Pharynx: Oropharynx is clear.  Eyes:     Extraocular Movements: Extraocular movements intact.  Cardiovascular:     Rate and Rhythm: Normal rate.     Pulses: Normal pulses.  Pulmonary:     Effort: Pulmonary effort is normal.  Abdominal:     Comments: Deferred  Genitourinary:    Comments: Deferred Musculoskeletal:        General: Normal range of motion.     Cervical back: Normal range of motion.  Skin:    General: Skin is warm.  Neurological:     General: No focal deficit present.     Mental Status: He is alert and oriented to person, place, and time.  Psychiatric:        Mood and Affect: Mood normal.        Behavior: Behavior normal.        Thought Content: Thought content normal.    Review of Systems  Constitutional:  Negative for chills and fever.  HENT:  Negative for hearing loss and sore throat.   Eyes:  Negative for blurred vision.  Respiratory:  Negative for cough, sputum production, shortness of breath and wheezing.   Cardiovascular:  Negative for chest pain and palpitations.  Gastrointestinal:  Negative for abdominal pain, constipation, diarrhea, heartburn,  nausea and vomiting.  Genitourinary:  Negative for dysuria, frequency and urgency.  Musculoskeletal: Negative.   Skin:  Negative for itching and rash.  Neurological:  Negative for  dizziness, tingling, tremors and headaches.  Endo/Heme/Allergies:        See allergy listing  Psychiatric/Behavioral:  Positive for depression. Negative for hallucinations and suicidal ideas. The patient is nervous/anxious. The patient does not have insomnia.    Blood pressure 116/77, pulse 90, temperature (!) 97.5 F (36.4 C), temperature source Oral, resp. rate 18, height 6' (1.829 m), weight 86.2 kg, SpO2 99%. Body mass index is 25.77 kg/m.  Mental Status Per Nursing Assessment::   On Admission:  Suicidal ideation indicated by others  Demographic Factors:  Male, Adolescent or young adult, [name], [name], and Unemployed  Loss Factors: Financial problems/change in socioeconomic status  Historical Factors: Prior suicide attempts, Family history of mental illness or substance abuse, and Impulsivity  Risk Reduction Factors:   Positive social support, Positive therapeutic relationship, and Positive coping skills or problem solving skills  Continued Clinical Symptoms:  Depression:   Impulsivity Recent sense of peace/wellbeing More than one psychiatric diagnosis  Cognitive Features That Contribute To Risk:  Polarized thinking    Suicide Risk:  Mild:  There are no identifiable plans, no associated intent, mild dysphoria and related symptoms, good self-control (both objective and subjective assessment), few other risk factors, and identifiable protective factors, including available and accessible social support.   Follow-up Information     [facility]. Go on 06/15/2023.   Why: Please go to this provider on 06/15/23 at 8:30 am for an assessment, to obtain therapy and medication management services. Contact information: [address] [place] [address] [phone]         [facility] Follow up on 06/18/2023.   Why: You have a phone screening appointment with [facility] in admissions on 06/18/23. Please call on this date between 10:00-11:00  AM. You will admit to this facility on 06/29/23. Your information has already been sent to this facility and they are aware of your interest in their program. Contact information: Address: [address] [place], [place] [address]  Phone: [phone]               Plan Of Care/Follow-up recommendations:  Discharge Recommendations:  The patient is being discharged to home. Patient is to take his discharge medications as ordered.  See follow up above. We recommend that he participates in individual therapy to target uncontrollable agitation and substance abuse.  We recommend that he participates in therapy to target personal conflict, to improve communication skills and conflict resolution skills. Patient is to initiate/implement a contingency based behavioral model to address his behavior. We recommend that he gets AIMS scale, height, weight, blood pressure, fasting lipid panel, fasting blood sugar in three months from discharge if he's on atypical antipsychotics.  Patient will benefit from monitoring of recurrent suicidal ideation since patient is on antidepressant medication. The patient should abstain from all illicit substances and alcohol. If the patient's symptoms worsen or do not continue to improve or if the patient becomes actively suicidal or homicidal then it is recommended that the patient return to the closest hospital emergency room or call 911 for further evaluation and treatment. National Suicide Prevention Lifeline 1800-SUICIDE or 913-160-0128. Please follow up with your primary medical doctor for all other medical needs.  The patient has been educated on the possible side effects to medications and she/her guardian is to  contact a medical professional and inform outpatient provider of any new side effects of medication. He is to take regular diet and activity as tolerated.  Will benefit from moderate daily exercise. Patient and Family was educated about removing/locking any  firearms, medications or dangerous products from the home.  Activity:  As tolerated Diet:  Regular Diet  Cecilie Lowers, FNP 06/14/2023, 10:32 AM

## 2023-06-14 NOTE — Progress Notes (Signed)
 Patient ID: Cody Poole, male   DOB: April 25, 1995, 28 y.o.   MRN: [id number] Discharge instructions, follow up appointments and medications reviewed with patient, pt verbalized understanding. Pt discharged home.

## 2023-06-14 NOTE — Progress Notes (Signed)
  [facility] Adult Case Management Discharge Plan :  Will you be returning to the same living situation after discharge:  No, patient will go to his mom's home ([place]) located at [address], [address], [place], [place] [place] [facility] [place] [facility])  At discharge, do you have transportation home?: Yes,  CSW arranged  transportation through [employer] at 12 PM Do you have the ability to pay for your medications: Yes,  patient has Medicaid  Release of information consent forms completed and in the chart;  Patient's signature needed at discharge.  Patient to Follow up at:  Follow-up Information     [facility]. Go on 06/15/2023.   Why: Please go to this provider on 06/15/23 at 8:30 am for an assessment, to obtain therapy and medication management services. Contact information: [address] [place] [address] [phone]         [facility] Follow up on 06/18/2023.   Why: You have a phone screening appointment with [facility] in admissions on 06/18/23. Please call on this date between 10:00-11:00 AM. You will admit to this facility on 06/29/23. Your information has already been sent to this facility and they are aware of your interest in their program. Contact information: Address: [address], [place], [place] [address] Phone: [phone] Fax: [phone]                Next level of care provider has access to [facility] Link:no  Safety Planning and Suicide Prevention discussed: Yes,  Caster Fayette (mom) [phone]     Has patient been referred to the Quitline?: Patient refused referral for treatment  Patient has been referred for addiction treatment: Yes, patient was accepted to [facility] and will be able to start the program on 06/29/2023.    Iyanla Eilers O Tashe Purdon, LCSWA 06/14/2023, 11:54 AM

## 2023-06-14 NOTE — Transportation (Signed)
 06/14/2023  Cody Poole DOB: [birth year] MRN: [id number]   RIDER WAIVER AND RELEASE OF LIABILITY  For the purposes of helping with transportation needs, [facility] partners with outside transportation providers (taxi companies, [place], Catering manager.) to give [employer] patients or other approved people the choice of on-demand rides [employer]") to our buildings for non-emergency visits.  By using [employer], I, the person signing this document, on behalf of myself and/or any legal minors (in my care using the [employer]), agree:  Science writer given to me are supplied by independent, outside transportation providers who do not work for, or have any affiliation with, [employer]. [employer] is not a transportation company. [facility] has no control over the quality or safety of the rides I get using [employer]. [facility] has no control over whether any outside ride will happen on time or not. [facility] gives no guarantee on the reliability, quality, safety, or availability on any rides, or that no mistakes will happen. I know and accept that traveling by vehicle (car, truck, [employer], Zenaida Niece, bus, taxi, etc.) has risks of serious injuries such as disability, being paralyzed, and death. I know and agree the risk of using [employer] is mine alone, and not [employer]. Transport Services are provided "as is" and as are available. The transportation providers are in charge for all inspections and care of the vehicles used to provide these rides. I agree not to take legal action against [facility], its agents, employees, officers, directors, representatives, insurers, attorneys, assigns, successors, subsidiaries, and affiliates at any time for any reasons related directly or indirectly to using [employer]. I also agree not to take legal action against [facility] or its affiliates for any injury, death, or damage to property caused by or related to using  [employer]. I have read this Waiver and Release of Liability, and I understand the terms used in it and their legal meaning. This Waiver is freely and voluntarily given with the understanding that my right (or any legal minors) to legal action against [place] relating to [employer] is knowingly given up to use these services.   I attest that I read the Ride Waiver and Release of Liability to Cody Poole, gave Mr. Martindale the opportunity to ask questions and answered the questions asked (if any). I affirm that Cody Poole then provided consent for assistance with transportation.    Rishikesh Khachatryan, LCSWA 06/14/2023

## 2023-06-14 NOTE — Discharge Summary (Signed)
 Physician Discharge Summary Note  Patient:  Cody Poole is an 28 y.o., male MRN:  [id number] DOB:  1995/12/27 Patient phone:  [phone] (home)  Patient address:   [address] [place] [address],   Total Time spent with patient: 45 minutes  Date of Admission:  06/08/2023 Date of Discharge:   06/14/2023  Reason for Admission:   Cody Poole, 28 year old male with a history of ODD, drug overdose, polysubstance abuse, schizophrenia and suicidal ideation. The patient presented to [facility] on June 07, 2023 under an involuntary commitment (IVC) petition after contacting his probation officer endorsing suicidal ideation with a specific plan to locate a firearm and end his life. The patient was admitted to [facility] for further treatment and stabilization.   Principal Problem: Schizophrenia [facility]) Discharge Diagnoses: Principal Problem:   Schizophrenia [facility]) Active Problems:   PTSD (post-traumatic stress disorder)  Past Psychiatric History: He disclosed two or three past suicide attempts, the most recent being an overdose in February 2023, which necessitated hospitalization. He reported that his psychiatric history includes [name], PTSD, bipolar disorder, polysubstance abuse, oppositional defiant disorder (ODD), and schizophrenia, with three psychiatric hospitalizations, the last occurring in February 2023 at [facility]. He reported past rehabilitation in [place], [place], over five years ago.   Current stressors identified include homelessness, being on parole, and lack of income. He reported that he has been out of prison for two months following incarceration for robbery and drug paraphernalia charges, with seven months remaining on parole. He reported current medications include Buspar 10 mg twice daily, Abilify 5 mg daily, and Suboxone daily. He reported past medication trials included Haldol, which  caused an adverse reaction, and Vyvanse, which he stated he would like to resume after discontinuing two years ago. A medication allergy to Haldol was reported.  Past Medical History:  Past Medical History:  Diagnosis Date   Anxiety    Back pain    Cervical spondylosis    Depression    ETOH abuse    Heroin abuse (HCC)    Hypertension    IV drug abuse (HCC)    Methamphetamine use (HCC)    Opioid abuse with withdrawal (HCC)    Paranoid schizophrenia (HCC)    Polysubstance abuse (HCC)    Subdural hematoma caused by concussion (HCC) 05/08/2022   Fall/Admit to [facility] for observation   Tobacco abuse    History reviewed. No pertinent surgical history. Family History:  Family History  Family history unknown: Yes   Family Psychiatric  History: Family mental health history includes schizophrenia in his father and bipolar disorder and [name] in his mother. No completed suicides in the family were reported, but both parents had a history of crack cocaine use.    Social History:  Social History   Substance and Sexual Activity  Alcohol Use Not Currently     Social History   Substance and Sexual Activity  Drug Use Yes   Types: IV, Cocaine, Amphetamines, Heroin, Marijuana, Methamphetamines    Social History   Socioeconomic History   Marital status: Single    Spouse name: Not on file   Number of children: Not on file   Years of education: Not on file   Highest education level: Not on file  Occupational History   Not on file  Tobacco Use   Smoking status: Every Day   Smokeless tobacco: Never  Vaping Use   Vaping status: Never Used  Substance  and Sexual Activity   Alcohol use: Not Currently   Drug use: Yes    Types: IV, Cocaine, Amphetamines, Heroin, Marijuana, Methamphetamines   Sexual activity: Not on file    Comment: heroin, crack cocaine  Other Topics Concern   Not on file  Social History Narrative   Not on file   Social Drivers of Health   Financial  Resource Strain: Not on file  Food Insecurity: Food Insecurity Present (06/08/2023)   Hunger Vital Sign    Worried About Running Out of Food in the Last Year: Often true    Ran Out of Food in the Last Year: Often true  Transportation Needs: Unmet Transportation Needs (06/08/2023)   PRAPARE - Administrator, Civil Service (Medical): Yes    Lack of Transportation (Non-Medical): Yes  Physical Activity: Not on file  Stress: Not on file  Social Connections: Not on file   Hospital Course:  During the patient's hospitalization, patient had extensive initial psychiatric evaluation, and follow-up psychiatric evaluations every day.  Psychiatric diagnoses provided upon initial assessment:  Principal Diagnosis: Schizophrenia (HCC) Diagnosis:  Principal Problem:   Schizophrenia (HCC) Active Problems:   PTSD (post-traumatic stress disorder)  Patient's psychiatric medications were adjusted on admission:  -Discontinue Abilify tablet 5 mg, oral, daily.  -Initiate olanzapine tablet 5 mg, oral, daily at bedtime, mood, psychosis. -Modify BuSpar tablet 20 mg, oral, 2 times daily, to 10 mg three times daily, anxiety. -Initiate Colace capsule 100 mg, oral, 2 times daily. -Initiate COWS -Continue Suboxone 8-2 mg per SL tablet, 2.5 tablet, sublingual, daily, opioid dependence. -Continue nicotine patch 21mg /24 hours ordered, tobacco use disorder   During the hospitalization, other adjustments were made to the patient's psychiatric medication regimen: None  Patient's care was discussed during the interdisciplinary team meeting every day during the hospitalization.  The patient denies having side effects to prescribed psychiatric medication.  Gradually, patient started adjusting to milieu. The patient was evaluated each day by a clinical provider to ascertain response to treatment. Improvement was noted by the patient's report of decreasing symptoms, improved sleep and appetite, affect, medication  tolerance, behavior, and participation in unit programming.  Patient was asked each day to complete a self inventory noting mood, mental status, pain, new symptoms, anxiety and concerns.    Symptoms were reported as significantly decreased or resolved completely by discharge.   On day of discharge, the patient reports that their mood is stable. The patient denied having suicidal thoughts for more than 48 hours prior to discharge.  Patient denies having homicidal thoughts.  Patient denies having auditory hallucinations.  Patient denies any visual hallucinations or other symptoms of psychosis. The patient was motivated to continue taking medication with a goal of continued improvement in mental health.   The patient reports their target psychiatric symptoms of depression responded well to the psychiatric medications, and the patient reports overall benefit other psychiatric hospitalization. Supportive psychotherapy was provided to the patient. The patient also participated in regular group therapy while hospitalized. Coping skills, problem solving as well as relaxation therapies were also part of the unit programming.  Labs were reviewed with the patient, and abnormal results were discussed with the patient.  The patient is able to verbalize their individual safety plan to this provider.  # It is recommended to the patient to continue psychiatric medications as prescribed, after discharge from the hospital.    # It is recommended to the patient to follow up with your outpatient psychiatric provider and PCP.  #  It was discussed with the patient, the impact of alcohol, drugs, tobacco have been there overall psychiatric and medical wellbeing, and total abstinence from substance use was recommended the patient.ed.  # Prescriptions provided or sent directly to preferred pharmacy at discharge. Patient agreeable to plan. Given opportunity to ask questions. Appears to feel comfortable with discharge.    #  In the event of worsening symptoms, the patient is instructed to call the crisis hotline, 911 and or go to the nearest ED for appropriate evaluation and treatment of symptoms. To follow-up with primary care provider for other medical issues, concerns and or health care needs  # Patient was discharged with a plan to follow up as noted below.   Physical Findings: AIMS:  , ,  ,  ,    CIWA:    COWS:  COWS Total Score: 0  Musculoskeletal: Strength & Muscle Tone: within normal limits Gait & Station: normal Patient leans: N/A  Psychiatric Specialty Exam:  Presentation  General Appearance:  Appropriate for Environment; Casual; Fairly Groomed  Eye Contact: Good  Speech: Clear and Coherent; Normal Rate  Speech Volume: Normal  Handedness: Right  Mood and Affect  Mood: Euthymic  Affect: Appropriate; Congruent  Thought Process  Thought Processes: Coherent  Descriptions of Associations:Intact  Orientation:Full (Time, Place and Person)  Thought Content:Logical  History of Schizophrenia/Schizoaffective disorder:No  Duration of Psychotic Symptoms:Greater than six months  Hallucinations:Hallucinations: None Description of Auditory Hallucinations: Denies Description of Visual Hallucinations: Denies  Ideas of Reference:None  Suicidal Thoughts:Suicidal Thoughts: No SI Active Intent and/or Plan: -- (Denies)  Homicidal Thoughts:Homicidal Thoughts: No  Sensorium  Memory: Immediate Good; Recent Good  Judgment: Good  Insight: Good  Executive Functions  Concentration: Good  Attention Span: Good  Recall: Fair  Fund of Knowledge: Fair  Language: Good  Psychomotor Activity  Psychomotor Activity: Psychomotor Activity: Normal  Assets  Assets: Desire for Improvement; Communication Skills; Resilience  Sleep  Sleep: Sleep: Good Number of Hours of Sleep: 8.25  Physical Exam: Physical Exam Vitals and nursing note reviewed.  Constitutional:       Appearance: He is normal weight.  HENT:     Head: Normocephalic.     Right Ear: External ear normal.     Left Ear: External ear normal.     Nose: Nose normal.     Mouth/Throat:     Mouth: Mucous membranes are moist.     Pharynx: Oropharynx is clear.  Eyes:     Extraocular Movements: Extraocular movements intact.  Cardiovascular:     Rate and Rhythm: Normal rate.     Pulses: Normal pulses.  Pulmonary:     Effort: Pulmonary effort is normal.  Abdominal:     Comments: Deferred  Genitourinary:    Comments: Deferred Musculoskeletal:        General: Normal range of motion.     Cervical back: Normal range of motion.  Skin:    General: Skin is warm.  Neurological:     General: No focal deficit present.     Mental Status: He is alert and oriented to person, place, and time.  Psychiatric:        Mood and Affect: Mood normal.        Behavior: Behavior normal.        Thought Content: Thought content normal.    Review of Systems  Constitutional:  Negative for chills and fever.  HENT:  Negative for sore throat.   Eyes:  Negative for blurred vision.  Respiratory:  Negative for cough, sputum production, shortness of breath and wheezing.   Cardiovascular:  Negative for chest pain and palpitations.  Gastrointestinal:  Negative for abdominal pain, constipation, diarrhea, heartburn, nausea and vomiting.  Genitourinary:  Negative for dysuria, frequency and urgency.  Musculoskeletal:  Negative for back pain, myalgias and neck pain.  Skin:  Negative for itching and rash.  Neurological:  Negative for dizziness, tingling, tremors, sensory change and headaches.  Endo/Heme/Allergies:        See allergy listing  Psychiatric/Behavioral:  Positive for depression (Stable with medication) and substance abuse (Stable with medication). Negative for hallucinations, memory loss and suicidal ideas. The patient is nervous/anxious. The patient does not have insomnia.    Blood pressure 116/77, pulse 90,  temperature (!) 97.5 F (36.4 C), temperature source Oral, resp. rate 18, height 6' (1.829 m), weight 86.2 kg, SpO2 99%. Body mass index is 25.77 kg/m.  Social History   Tobacco Use  Smoking Status Every Day  Smokeless Tobacco Never   Tobacco Cessation:  A prescription for an FDA-approved tobacco cessation medication provided at discharge   Blood Alcohol level:  Lab Results  Component Value Date   [facility] <10 06/08/2023   ETH <10 06/03/2023   Metabolic Disorder Labs:  Lab Results  Component Value Date   HGBA1C 5.4 06/11/2023   MPG 108.28 06/11/2023   No results found for: "PROLACTIN" Lab Results  Component Value Date   CHOL 137 06/11/2023   TRIG 74 06/11/2023   HDL 51 06/11/2023   CHOLHDL 2.7 06/11/2023   VLDL 15 06/11/2023   LDLCALC 71 06/11/2023    See Psychiatric Specialty Exam and Suicide Risk Assessment completed by Attending Physician prior to discharge.  Discharge destination:  Home with his mother  Is patient on multiple antipsychotic therapies at discharge:  No   Has Patient had three or more failed trials of antipsychotic monotherapy by history:  No  Recommended Plan for Multiple Antipsychotic Therapies: NA  Discharge Instructions     Diet - low sodium heart healthy   Complete by: As directed    Increase activity slowly   Complete by: As directed    Increase activity slowly   Complete by: As directed       Allergies as of 06/14/2023       Reactions   Haldol [haloperidol] Swelling   Tongue swelling  Dysarthria  Relieved with benadryl         Medication List     STOP taking these medications    ARIPiprazole 5 MG tablet Commonly known as: ABILIFY       TAKE these medications      Indication  buprenorphine-naloxone 8-2 mg Subl SL tablet Commonly known as: SUBOXONE Place 2.5 tablets under the tongue daily.  Indication: Opioid Dependence   busPIRone 10 MG tablet Commonly known as: BUSPAR Take 1 tablet (10 mg total) by mouth every  8 (eight) hours. What changed:  how much to take when to take this  Indication: Anxiety Disorder   naloxone 4 MG/0.1ML Liqd nasal spray kit Commonly known as: NARCAN Place 1 spray into the nose once for 1 dose. For opiate overdose  Indication: Opioid Overdose   nicotine 21 mg/24hr patch Commonly known as: NICODERM CQ - dosed in mg/24 hours Place 1 patch (21 mg total) onto the skin daily. Start taking on: June 15, 2023  Indication: Nicotine Addiction   nicotine polacrilex 2 MG gum Commonly known as: NICORETTE Take 1 each (2 mg total) by mouth every  4 (four) hours as needed for smoking cessation.  Indication: Nicotine Addiction   OLANZapine 5 MG tablet Commonly known as: ZYPREXA Take 1 tablet (5 mg total) by mouth at bedtime.  Indication: Schizophrenia        Follow-up Information     [facility] Go on 06/15/2023.   Why: Please go to this provider on 06/15/23 at 8:30 am for an assessment, to obtain therapy and medication management services. Contact information: [address] [place] [address] [phone]         [facility] Follow up on 06/18/2023.   Why: You have a phone screening appointment with [facility] in admissions on 06/18/23. Please call on this date between 10:00-11:00 AM. You will admit to this facility on 06/29/23. Your information has already been sent to this facility and they are aware of your interest in their program. Contact information: Address: [address] [place], [place] [address]  Phone: [phone]               Follow-up recommendations:   Discharge Recommendations:  The patient is being discharged to home with his mother. Patient is to take his discharge medications as ordered.  See follow up above. We recommend that he participates in individual therapy to target uncontrollable agitation and substance abuse.  We recommend that he participates in therapy to target personal conflict, to improve communication  skills and conflict resolution skills. Patient is to initiate/implement a contingency based behavioral model to address his behavior. We recommend that he gets AIMS scale, height, weight, blood pressure, fasting lipid panel, fasting blood sugar in three months from discharge if he's on atypical antipsychotics.  Patient will benefit from monitoring of recurrent suicidal ideation since patient is on antidepressant medication. The patient should abstain from all illicit substances and alcohol. If the patient's symptoms worsen or do not continue to improve or if the patient becomes actively suicidal or homicidal then it is recommended that the patient return to the closest hospital emergency room or call 911 for further evaluation and treatment. National Suicide Prevention Lifeline 1800-SUICIDE or 212-786-1815. Please follow up with your primary medical doctor for all other medical needs.  The patient has been educated on the possible side effects to medications and she/her guardian is to contact a medical professional and inform outpatient provider of any new side effects of medication. He is to take regular diet and activity as tolerated.  Will benefit from moderate daily exercise. Patient and Family was educated about removing/locking any firearms, medications or dangerous products from the home.  Activity:  As tolerated Diet:  Regular Diet  Signed: Cecilie Lowers, FNP 06/14/2023, 10:45 AM

## 2023-06-14 NOTE — Discharge Instructions (Signed)

## 2023-06-14 NOTE — Progress Notes (Signed)
   06/14/23 0553  15 Minute Checks  Location Bedroom  Visual Appearance Calm  Behavior Sleeping  Sleep (Behavioral Health Patients Only)  Calculate sleep? (Click Yes once per 24 hr at 0600 safety check) Yes  Documented sleep last 24 hours 8.25

## 2023-09-23 ENCOUNTER — Ambulatory Visit (HOSPITAL_COMMUNITY)
Admission: EM | Admit: 2023-09-23 | Discharge: 2023-09-24 | Disposition: A | Discharge status: Other Acute Inpatient Hospital | Payer: Self-pay | Attending: Psychiatry | Admitting: Psychiatry

## 2023-09-23 DIAGNOSIS — Z5900 Homelessness unspecified: Secondary | ICD-10-CM | POA: Insufficient documentation

## 2023-09-23 DIAGNOSIS — Z9151 Personal history of suicidal behavior: Secondary | ICD-10-CM | POA: Insufficient documentation

## 2023-09-23 DIAGNOSIS — Z8619 Personal history of other infectious and parasitic diseases: Secondary | ICD-10-CM | POA: Insufficient documentation

## 2023-09-23 DIAGNOSIS — Z560 Unemployment, unspecified: Secondary | ICD-10-CM | POA: Insufficient documentation

## 2023-09-23 DIAGNOSIS — R45851 Suicidal ideations: Secondary | ICD-10-CM | POA: Insufficient documentation

## 2023-09-23 DIAGNOSIS — F251 Schizoaffective disorder, depressive type: Secondary | ICD-10-CM | POA: Insufficient documentation

## 2023-09-23 LAB — CBC WITH DIFFERENTIAL/PLATELET
Abs Immature Granulocytes: 0.01 10*3/uL (ref 0.00–0.07)
Basophils Absolute: 0 10*3/uL (ref 0.0–0.1)
Basophils Relative: 1 %
Eosinophils Absolute: 0 10*3/uL (ref 0.0–0.5)
Eosinophils Relative: 0 %
HCT: 38.3 % — ABNORMAL LOW (ref 39.0–52.0)
Hemoglobin: 13 g/dL (ref 13.0–17.0)
Immature Granulocytes: 0 %
Lymphocytes Relative: 34 %
Lymphs Abs: 2.1 10*3/uL (ref 0.7–4.0)
MCH: 28.6 pg (ref 26.0–34.0)
MCHC: 33.9 g/dL (ref 30.0–36.0)
MCV: 84.2 fL (ref 80.0–100.0)
Monocytes Absolute: 0.3 10*3/uL (ref 0.1–1.0)
Monocytes Relative: 5 %
Neutro Abs: 3.6 10*3/uL (ref 1.7–7.7)
Neutrophils Relative %: 60 %
Platelets: 260 10*3/uL (ref 150–400)
RBC: 4.55 MIL/uL (ref 4.22–5.81)
RDW: 13.2 % (ref 11.5–15.5)
WBC: 6 10*3/uL (ref 4.0–10.5)
nRBC: 0 % (ref 0.0–0.2)

## 2023-09-23 LAB — HEMOGLOBIN A1C
Hgb A1c MFr Bld: 5.2 % (ref 4.8–5.6)
Mean Plasma Glucose: 102.54 mg/dL

## 2023-09-23 LAB — COMPREHENSIVE METABOLIC PANEL WITH GFR
ALT: 7 U/L (ref 0–44)
AST: 13 U/L — ABNORMAL LOW (ref 15–41)
Albumin: 4.5 g/dL (ref 3.5–5.0)
Alkaline Phosphatase: 59 U/L (ref 38–126)
Anion gap: 13 (ref 5–15)
BUN: 8 mg/dL (ref 6–20)
CO2: 20 mmol/L — ABNORMAL LOW (ref 22–32)
Calcium: 9.6 mg/dL (ref 8.9–10.3)
Chloride: 106 mmol/L (ref 98–111)
Creatinine, Ser: 0.96 mg/dL (ref 0.61–1.24)
GFR, Estimated: >60 mL/min (ref >60)
Glucose, Bld: 109 mg/dL — ABNORMAL HIGH (ref 70–99)
Potassium: 3.8 mmol/L (ref 3.5–5.1)
Sodium: 139 mmol/L (ref 135–145)
Total Bilirubin: 0.8 mg/dL (ref 0.0–1.2)
Total Protein: 6.9 g/dL (ref 6.5–8.1)

## 2023-09-23 LAB — TSH: TSH: 2.858 u[IU]/mL (ref 0.350–4.500)

## 2023-09-23 LAB — POCT URINE DRUG SCREEN - MANUAL ENTRY (I-SCREEN)
POC Amphetamine UR: NOT DETECTED
POC Buprenorphine (BUP): NOT DETECTED
POC Cocaine UR: NOT DETECTED
POC Marijuana UR: POSITIVE — AB
POC Methadone UR: NOT DETECTED
POC Methamphetamine UR: NOT DETECTED
POC Morphine: NOT DETECTED
POC Oxazepam (BZO): NOT DETECTED
POC Oxycodone UR: NOT DETECTED
POC Secobarbital (BAR): NOT DETECTED

## 2023-09-23 LAB — ETHANOL: Alcohol, Ethyl (B): <15 mg/dL (ref <15)

## 2023-09-23 MED ORDER — BUSPIRONE HCL 10 MG PO TABS
10.0000 mg | ORAL_TABLET | Freq: Three times a day (TID) | ORAL | Status: DC
  Administered 2023-09-23 (×2): 10 mg via ORAL
  Filled 2023-09-23 (×2): qty 1

## 2023-09-23 MED ORDER — OLANZAPINE 5 MG PO TABS
5.0000 mg | ORAL_TABLET | Freq: Every day | ORAL | Status: DC
  Administered 2023-09-23: 5 mg via ORAL
  Filled 2023-09-23: qty 1

## 2023-09-23 MED ORDER — ALUM & MAG HYDROXIDE-SIMETH 200-200-20 MG/5ML PO SUSP: 30.0000 mL | ORAL | Status: DC | PRN

## 2023-09-23 MED ORDER — OLANZAPINE 10 MG IM SOLR: 5.0000 mg | Freq: Three times a day (TID) | INTRAMUSCULAR | Status: DC | PRN

## 2023-09-23 MED ORDER — OLANZAPINE 5 MG PO TBDP: 5.0000 mg | ORAL_TABLET | Freq: Three times a day (TID) | ORAL | Status: DC | PRN

## 2023-09-23 MED ORDER — NICOTINE POLACRILEX 2 MG MT GUM: 2.0000 mg | CHEWING_GUM | OROMUCOSAL | Status: DC | PRN

## 2023-09-23 MED ORDER — MAGNESIUM HYDROXIDE 400 MG/5ML PO SUSP: 30.0000 mL | Freq: Every day | ORAL | Status: DC | PRN

## 2023-09-23 MED ORDER — OLANZAPINE 10 MG IM SOLR: 10.0000 mg | Freq: Three times a day (TID) | INTRAMUSCULAR | Status: DC | PRN

## 2023-09-23 MED ORDER — HYDROXYZINE HCL 25 MG PO TABS
25.0000 mg | ORAL_TABLET | Freq: Three times a day (TID) | ORAL | Status: DC | PRN
  Administered 2023-09-23: 25 mg via ORAL
  Filled 2023-09-23: qty 1

## 2023-09-23 MED ORDER — NICOTINE 21 MG/24HR TD PT24: 21.0000 mg | MEDICATED_PATCH | Freq: Every day | TRANSDERMAL | Status: DC

## 2023-09-23 MED ORDER — ACETAMINOPHEN 325 MG PO TABS
650.0000 mg | ORAL_TABLET | Freq: Four times a day (QID) | ORAL | Status: DC | PRN
  Administered 2023-09-23: 650 mg via ORAL
  Filled 2023-09-23: qty 2
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
## 2023-09-23 NOTE — Discharge Instructions (Signed)
Pt transferred to [facility]. 

## 2023-09-23 NOTE — ED Provider Notes (Addendum)
 BH Medical Screening Examination  Date: 09/23/23 Patient Name: Cody Poole MRN: [id number] Chief Complaint: IVC  Diagnoses:  Final diagnoses:  Schizoaffective disorder, depressive type (HCC)  Suicidal ideation    HPI: Adarian Bur 28 y.o., male patient presented to [facility] under involuntary commitment accompanied by GPD with complaints of suicidal ideations with plan.  Jacquelyn Antony, is seen face to face by this provider, consulted with Dr. Genita Keys; and chart reviewed on 09/23/23.  Per chart review, pt has past psychiatric history of Schizophrenia, Polysubstance use -heroine, methamphetamine, THC, cocaine and ETOH, GAD, [name] and PTSD.He is not currently seeing outpatient mental health provider. Last inpatient psychiatric hospitalization was in February 2025 at [facility] under IVC for SI. Medical history significant for hypertension, chronic back pain, hepatitis C, and hx of subdural hematoma from a concussion in 2024.   On evaluation Khyri Hinzman reports that he was released from jail yesterday after a 90 day incarceration for violating his parole and using heroine and crystal meth. He reports going to his appointment with his probation officer today and time had it he was having suicidal ideations with a plan to overdose on drugs or shoot himself in the head with a gun.  Patient reports that he can gain access to a gun by getting up from one of his friends.  He reports that he has no reason to live outside of his fiance who has been sober for 5 years.  Patient reports that he is currently unemployed and homeless.  He reports having a suicide attempt last year by overdosing on heroin and meth.  Patient reports that he was getting his psychiatric medications while in prison.  He reports that he was previously on Suboxone  20 mg a day and that he would like to restart this.  Patient has not used any heroin, opiates or methamphetamines in 3 months and does not have a current outpatient clinic prescribing Suboxone .   Per the PDMP, patient has not been prescribed Suboxone  since March 2025.  Patient also reports decreased sleep, auditory hallucinations of voices narrating my day and visual hallucinations of monsters and aliens.  Patient states the voices and seeing things do not bother me anymore because I am used to them .  Discussed with patient the plan to recommend inpatient psychiatric hospitalization.  First examination completed and IVC is upheld.  Name of Substance: Methamphetamine and Heroin Age of first use: 36 Amount using: Unsure  Frequency of use: Daily  Last used: 91 days ago Method of use: IV Withdrawal symptoms: GI upset, hot/cold sweats and muscle aches Longest period of sobriety: a few months  During evaluation Daric Koren is sitting up in assessment room, in no acute distress.  He is alert & oriented x 4, calm, cooperative and attentive for this assessment.  His mood is depressed with congruent affect.  He has normal speech, and behavior. Pt does not appear to be responding to internal or external stimuli.  Patient is able to converse coherently, goal directed thoughts, no distractibility, or pre-occupation. Pt endorses suicidal ideations with plan to overdose or shoot himself with a friend's gun. He also reports auditory hallucinations of voices commentating my day, denies command in nature. He reports visual hallucinations of monsters and aliens. He currently denies homicidal ideation. Patient answered assessment questions appropriately.  He denies physical complaints and acute pain. Pt does have an ankle monitor on his left ankle.     Total Time spent with patient: 45 minutes  Musculoskeletal  Strength &  Muscle Tone: within normal limits Gait & Station: normal Patient leans: N/A  Psychiatric Specialty Exam  Presentation General Appearance:  Casual  Eye Contact: Fair  Speech: Clear and Coherent  Speech Volume: Normal  Handedness: Right   Mood and Affect   Mood: Depressed; Dysphoric  Affect: Congruent; Depressed   Thought Process  Thought Processes: Coherent  Descriptions of Associations:Intact  Orientation:Full (Time, Place and Person)  Thought Content:Paranoid Ideation; Logical  Diagnosis of Schizophrenia or Schizoaffective disorder in past: Yes  Duration of Psychotic Symptoms: Greater than six months  Hallucinations:Hallucinations: None; Auditory; Visual Description of Auditory Hallucinations: narrorating his life Description of Visual Hallucinations: monsters and aliens  Ideas of Reference:Paranoia  Suicidal Thoughts:Suicidal Thoughts: Yes, Active SI Active Intent and/or Plan: With Intent; With Plan  Homicidal Thoughts:Homicidal Thoughts: No   Sensorium  Memory: Recent Fair; Immediate Good  Judgment: Poor  Insight: Fair   Chartered certified accountant: Fair  Attention Span: Fair  Recall: [employer] of Knowledge: Fair  Language: Fair   Psychomotor Activity  Psychomotor Activity:Psychomotor Activity: Normal   Assets  Assets: Manufacturing systems engineer; Desire for Improvement; Financial Resources/Insurance; Physical Health; Resilience   Sleep  Sleep:Sleep: Fair Number of Hours of Sleep: 4   Nutritional Assessment (For OBS and FBC admissions only) Has the patient had a weight loss or gain of 10 pounds or more in the last 3 months?: No Has the patient had a decrease in food intake/or appetite?: No Does the patient have dental problems?: No Does the patient have eating habits or behaviors that may be indicators of an eating disorder including binging or inducing vomiting?: No Has the patient recently lost weight without trying?: 0 Has the patient been eating poorly because of a decreased appetite?: 0 Malnutrition Screening Tool Score: 0    Physical Exam Vitals and nursing note reviewed.  Constitutional:      Appearance: Normal appearance.  HENT:     Head: Normocephalic.      Nose: Nose normal.   Eyes:     Extraocular Movements: Extraocular movements intact.    Cardiovascular:     Rate and Rhythm: Normal rate.  Pulmonary:     Effort: Pulmonary effort is normal.   Musculoskeletal:        General: Normal range of motion.     Cervical back: Normal range of motion.   Neurological:     General: No focal deficit present.     Mental Status: He is alert and oriented to person, place, and time.    Review of Systems  Constitutional: Negative.   HENT: Negative.    Eyes: Negative.   Respiratory: Negative.    Cardiovascular: Negative.   Gastrointestinal: Negative.   Genitourinary: Negative.   Musculoskeletal: Negative.   Neurological: Negative.   Endo/Heme/Allergies: Negative.   Psychiatric/Behavioral:  Positive for depression, hallucinations, substance abuse and suicidal ideas.     Pulse 91, temperature 98.7 F (37.1 C), temperature source Oral, resp. rate 20, SpO2 96%. There is no height or weight on file to calculate BMI.  Past Psychiatric History: Schizophrenia, Polysubstance use -heroine, methamphetamine, THC, cocaine and ETOH, GAD, [name] and PTSD.    Is the patient at risk to self? Yes  Has the patient been a risk to self in the past 6 months? Yes .    Has the patient been a risk to self within the distant past? Yes   Is the patient a risk to others? No   Has the patient been a risk to  others in the past 6 months? No   Has the patient been a risk to others within the distant past? No   Past Medical History:  Past Medical History:  Diagnosis Date   Anxiety    Back pain    Cervical spondylosis    Depression    ETOH abuse    Heroin abuse (HCC)    Hypertension    IV drug abuse (HCC)    Methamphetamine use (HCC)    Opioid abuse with withdrawal (HCC)    Paranoid schizophrenia (HCC)    Polysubstance abuse (HCC)    Subdural hematoma caused by concussion (HCC) 05/08/2022   Fall/Admit to [facility] for observation   Tobacco abuse       Family History: Father- schizophrenia, mother- bipolar disorder and [name]  Social History: Pt currently unemployed and homeless. He is also on parole for drug related charges.Pt has ankle monitor to left ankle.    Last Labs:  Admission on 09/23/2023  Component Date Value Ref Range Status   WBC 09/23/2023 6.0  4.0 - 10.5 K/uL Final   RBC 09/23/2023 4.55  4.22 - 5.81 MIL/uL Final   Hemoglobin 09/23/2023 13.0  13.0 - 17.0 g/dL Final   HCT 40/98/1191 38.3 (L)  39.0 - 52.0 % Final   MCV 09/23/2023 84.2  80.0 - 100.0 fL Final   MCH 09/23/2023 28.6  26.0 - 34.0 pg Final   MCHC 09/23/2023 33.9  30.0 - 36.0 g/dL Final   RDW 47/82/9562 13.2  11.5 - 15.5 % Final   Platelets 09/23/2023 260  150 - 400 K/uL Final   nRBC 09/23/2023 0.0  0.0 - 0.2 % Final   Neutrophils Relative % 09/23/2023 60  % Final   Neutro Abs 09/23/2023 3.6  1.7 - 7.7 K/uL Final   Lymphocytes Relative 09/23/2023 34  % Final   Lymphs Abs 09/23/2023 2.1  0.7 - 4.0 K/uL Final   Monocytes Relative 09/23/2023 5  % Final   Monocytes Absolute 09/23/2023 0.3  0.1 - 1.0 K/uL Final   Eosinophils Relative 09/23/2023 0  % Final   Eosinophils Absolute 09/23/2023 0.0  0.0 - 0.5 K/uL Final   Basophils Relative 09/23/2023 1  % Final   Basophils Absolute 09/23/2023 0.0  0.0 - 0.1 K/uL Final   Immature Granulocytes 09/23/2023 0  % Final   Abs Immature Granulocytes 09/23/2023 0.01  0.00 - 0.07 K/uL Final   Performed at [facility], [address] [place], [place] [address]   POC Amphetamine UR 09/23/2023 None Detected  NONE DETECTED (Cut Off Level 1000 ng/mL) Final   POC Secobarbital (BAR) 09/23/2023 None Detected  NONE DETECTED (Cut Off Level 300 ng/mL) Final   POC Buprenorphine  (BUP) 09/23/2023 None Detected  NONE DETECTED (Cut Off Level 10 ng/mL) Final   POC Oxazepam (BZO) 09/23/2023 None Detected  NONE DETECTED (Cut Off Level 300 ng/mL) Final   POC Cocaine UR 09/23/2023 None Detected  NONE DETECTED (Cut Off Level 300 ng/mL) Final    POC Methamphetamine UR 09/23/2023 None Detected  NONE DETECTED (Cut Off Level 1000 ng/mL) Final   POC Morphine 09/23/2023 None Detected  NONE DETECTED (Cut Off Level 300 ng/mL) Final   POC Methadone UR 09/23/2023 None Detected  NONE DETECTED (Cut Off Level 300 ng/mL) Final   POC Oxycodone UR 09/23/2023 None Detected  NONE DETECTED (Cut Off Level 100 ng/mL) Final   POC Marijuana UR 09/23/2023 Positive (A)  NONE DETECTED (Cut Off Level 50 ng/mL) Final  Admission on 06/08/2023, Discharged on 06/14/2023  Component Date Value Ref Range Status   Cholesterol 06/11/2023 137  0 - 200 mg/dL Final   Triglycerides 16/01/9603 74  <150 mg/dL Final   HDL 54/12/8117 51  >40 mg/dL Final   Total CHOL/HDL Ratio 06/11/2023 2.7  RATIO Final   VLDL 06/11/2023 15  0 - 40 mg/dL Final   LDL Cholesterol 06/11/2023 71  0 - 99 mg/dL Final   Comment:        Total Cholesterol/HDL:CHD Risk Coronary Heart Disease Risk Table                     Men   Women  1/2 Average Risk   3.4   3.3  Average Risk       5.0   4.4  2 X Average Risk   9.6   7.1  3 X Average Risk  23.4   11.0        Use the calculated Patient Ratio above and the CHD Risk Table to determine the patient's CHD Risk.        ATP III CLASSIFICATION (LDL):  <100     mg/dL   Optimal  147-829  mg/dL   Near or Above                    Optimal  130-159  mg/dL   Borderline  562-130  mg/dL   High  >865     mg/dL   Very High Performed at [facility], [address], [place], [place] [address]    Hgb A1c MFr Bld 06/11/2023 5.4  4.8 - 5.6 % Final   Comment: (NOTE) Pre diabetes:          5.7%-6.4%  Diabetes:              >6.4%  Glycemic control for   <7.0% adults with diabetes    Mean Plasma Glucose 06/11/2023 108.28  mg/dL Final   Performed at [facility], [address], [place], [place] [address]  Admission on 06/07/2023, Discharged on 06/08/2023  Component Date Value Ref Range Status   WBC 06/08/2023 8.0  4.0 - 10.5  K/uL Final   RBC 06/08/2023 4.54  4.22 - 5.81 MIL/uL Final   Hemoglobin 06/08/2023 12.9 (L)  13.0 - 17.0 g/dL Final   HCT 84/13/2440 40.0  39.0 - 52.0 % Final   MCV 06/08/2023 88.1  80.0 - 100.0 fL Final   MCH 06/08/2023 28.4  26.0 - 34.0 pg Final   MCHC 06/08/2023 32.3  30.0 - 36.0 g/dL Final   RDW 02/07/2535 13.3  11.5 - 15.5 % Final   Platelets 06/08/2023 384  150 - 400 K/uL Final   nRBC 06/08/2023 0.0  0.0 - 0.2 % Final   Neutrophils Relative % 06/08/2023 73  % Final   Neutro Abs 06/08/2023 5.8  1.7 - 7.7 K/uL Final   Lymphocytes Relative 06/08/2023 17  % Final   Lymphs Abs 06/08/2023 1.3  0.7 - 4.0 K/uL Final   Monocytes Relative 06/08/2023 8  % Final   Monocytes Absolute 06/08/2023 0.6  0.1 - 1.0 K/uL Final   Eosinophils Relative 06/08/2023 1  % Final   Eosinophils Absolute 06/08/2023 0.1  0.0 - 0.5 K/uL Final   Basophils Relative 06/08/2023 1  % Final   Basophils Absolute 06/08/2023 0.1  0.0 - 0.1 K/uL Final   Immature Granulocytes 06/08/2023 0  % Final   Abs  Immature Granulocytes 06/08/2023 0.03  0.00 - 0.07 K/uL Final   Performed at [facility], [address] [place], [place] [address]   Sodium 06/08/2023 142  135 - 145 mmol/L Final   Potassium 06/08/2023 4.6  3.5 - 5.1 mmol/L Final   Chloride 06/08/2023 102  98 - 111 mmol/L Final   CO2 06/08/2023 27  22 - 32 mmol/L Final   Glucose, Bld 06/08/2023 133 (H)  70 - 99 mg/dL Final   Glucose reference range applies only to samples taken after fasting for at least 8 hours.   BUN 06/08/2023 13  6 - 20 mg/dL Final   Creatinine, Ser 06/08/2023 0.93  0.61 - 1.24 mg/dL Final   Calcium 60/45/4098 9.8  8.9 - 10.3 mg/dL Final   Total Protein 11/91/4782 6.6  6.5 - 8.1 g/dL Final   Albumin 95/62/1308 3.4 (L)  3.5 - 5.0 g/dL Final   AST 65/78/4696 51 (H)  15 - 41 U/L Final   ALT 06/08/2023 38  0 - 44 U/L Final   Alkaline Phosphatase 06/08/2023 84  38 - 126 U/L Final   Total Bilirubin 06/08/2023 0.7  0.0 - 1.2 mg/dL Final   GFR,  Estimated 06/08/2023 >60  >60 mL/min Final   Comment: (NOTE) Calculated using the CKD-EPI Creatinine Equation (2021)    Anion gap 06/08/2023 13  5 - 15 Final   Performed at [facility], [address] [place], [place] [address]   Alcohol, Ethyl (B) 06/08/2023 <10  <10 mg/dL Final   Comment: (NOTE) Lowest detectable limit for serum alcohol is 10 mg/dL.  For medical purposes only. Performed at [facility], [address], [place], [place] [address]    TSH 06/08/2023 2.342  0.350 - 4.500 uIU/mL Final   Comment: Performed by a 3rd Generation assay with a functional sensitivity of <=0.01 uIU/mL. Performed at [facility], [address], [place], [place] [address]    POC Amphetamine UR 06/08/2023 Positive (A)  NONE DETECTED (Cut Off Level 1000 ng/mL) Final   POC Secobarbital (BAR) 06/08/2023 None Detected  NONE DETECTED (Cut Off Level 300 ng/mL) Final   POC Buprenorphine  (BUP) 06/08/2023 Positive (A)  NONE DETECTED (Cut Off Level 10 ng/mL) Final   POC Oxazepam (BZO) 06/08/2023 None Detected  NONE DETECTED (Cut Off Level 300 ng/mL) Final   POC Cocaine UR 06/08/2023 Positive (A)  NONE DETECTED (Cut Off Level 300 ng/mL) Final   POC Methamphetamine UR 06/08/2023 Positive (A)  NONE DETECTED (Cut Off Level 1000 ng/mL) Final   POC Morphine 06/08/2023 Positive (A)  NONE DETECTED (Cut Off Level 300 ng/mL) Final   POC Methadone UR 06/08/2023 None Detected  NONE DETECTED (Cut Off Level 300 ng/mL) Final   POC Oxycodone UR 06/08/2023 None Detected  NONE DETECTED (Cut Off Level 100 ng/mL) Final   POC Marijuana UR 06/08/2023 Positive (A)  NONE DETECTED (Cut Off Level 50 ng/mL) Final  Admission on 06/03/2023, Discharged on 06/03/2023  Component Date Value Ref Range Status   Alcohol, Ethyl (B) 06/03/2023 <10  <10 mg/dL Final   Comment: (NOTE) Lowest detectable limit for serum alcohol is 10 mg/dL.  For medical purposes only. Performed at [facility], [address],  [place], [place] [address]    Salicylate Lvl 06/03/2023 <7.0 (L)  7.0 - 30.0 mg/dL Final   Performed at [facility], [address], [place], [place] [address]   Acetaminophen  (Tylenol ), Serum 06/03/2023 <10 (L)  10 - 30 ug/mL Final   Comment: (NOTE) Therapeutic concentrations vary significantly. A range of 10-30 ug/mL  may be an effective concentration for many patients. However, some  are best treated at concentrations outside of this range. Acetaminophen  concentrations >150 ug/mL at 4 hours after ingestion  and >50 ug/mL at 12 hours after ingestion are often associated with  toxic reactions.  Performed at [facility], [address] [place], [place] [address]    Opiates 06/03/2023 NONE DETECTED  NONE DETECTED Final   Cocaine 06/03/2023 NONE DETECTED  NONE DETECTED Final   Benzodiazepines 06/03/2023 NONE DETECTED  NONE DETECTED Final   Amphetamines 06/03/2023 POSITIVE (A)  NONE DETECTED Final   Tetrahydrocannabinol 06/03/2023 POSITIVE (A)  NONE DETECTED Final   Barbiturates 06/03/2023 NONE DETECTED  NONE DETECTED Final   Comment: (NOTE) DRUG SCREEN FOR MEDICAL PURPOSES ONLY.  IF CONFIRMATION IS NEEDED FOR ANY PURPOSE, NOTIFY LAB WITHIN 5 DAYS.  LOWEST DETECTABLE LIMITS FOR URINE DRUG SCREEN Drug Class                     Cutoff (ng/mL) Amphetamine and metabolites    1000 Barbiturate and metabolites    200 Benzodiazepine                 200 Opiates and metabolites        300 Cocaine and metabolites        300 THC                            50 Performed at [facility], [address], [place], [place] [address]   Admission on 06/03/2023, Discharged on 06/03/2023  Component Date Value Ref Range Status   Lipase 06/03/2023 19  11 - 51 U/L Final   Performed at [facility], [address] [place], [place] [address]   Sodium 06/03/2023 137  135 - 145 mmol/L Final   Potassium 06/03/2023 3.8  3.5 - 5.1 mmol/L Final   Chloride 06/03/2023 101  98 - 111  mmol/L Final   CO2 06/03/2023 23  22 - 32 mmol/L Final   Glucose, Bld 06/03/2023 99  70 - 99 mg/dL Final   Glucose reference range applies only to samples taken after fasting for at least 8 hours.   BUN 06/03/2023 21 (H)  6 - 20 mg/dL Final   Creatinine, Ser 06/03/2023 0.97  0.61 - 1.24 mg/dL Final   Calcium 91/47/8295 9.6  8.9 - 10.3 mg/dL Final   Total Protein 62/13/0865 7.4  6.5 - 8.1 g/dL Final   Albumin 78/46/9629 3.9  3.5 - 5.0 g/dL Final   AST 52/84/1324 69 (H)  15 - 41 U/L Final   ALT 06/03/2023 44  0 - 44 U/L Final   Alkaline Phosphatase 06/03/2023 72  38 - 126 U/L Final   Total Bilirubin 06/03/2023 0.8  0.0 - 1.2 mg/dL Final   GFR, Estimated 06/03/2023 >60  >60 mL/min Final   Comment: (NOTE) Calculated using the CKD-EPI Creatinine Equation (2021)    Anion gap 06/03/2023 13  5 - 15 Final   Performed at [facility], [address] [place], [place] [address]   WBC 06/03/2023 8.8  4.0 - 10.5 K/uL Final   RBC 06/03/2023 4.36  4.22 - 5.81 MIL/uL Final   Hemoglobin 06/03/2023 12.5 (L)  13.0 - 17.0 g/dL Final   HCT 72/53/6644 38.5 (L)  39.0 -  52.0 % Final   MCV 06/03/2023 88.3  80.0 - 100.0 fL Final   MCH 06/03/2023 28.7  26.0 - 34.0 pg Final   MCHC 06/03/2023 32.5  30.0 - 36.0 g/dL Final   RDW 44/04/270 13.3  11.5 - 15.5 % Final   Platelets 06/03/2023 416 (H)  150 - 400 K/uL Final   nRBC 06/03/2023 0.0  0.0 - 0.2 % Final   Performed at [facility], [address] [place], [place] [address]   Color, Urine 06/03/2023 YELLOW  YELLOW Final   APPearance 06/03/2023 CLEAR  CLEAR Final   Specific Gravity, Urine 06/03/2023 1.030  1.005 - 1.030 Final   pH 06/03/2023 5.0  5.0 - 8.0 Final   Glucose, UA 06/03/2023 NEGATIVE  NEGATIVE mg/dL Final   Hgb urine dipstick 06/03/2023 NEGATIVE  NEGATIVE Final   Bilirubin Urine 06/03/2023 NEGATIVE  NEGATIVE Final   Ketones, ur 06/03/2023 5 (A)  NEGATIVE mg/dL Final   Protein, ur 40/34/7425 NEGATIVE  NEGATIVE mg/dL Final   Nitrite  95/63/8756 NEGATIVE  NEGATIVE Final   Leukocytes,Ua 06/03/2023 NEGATIVE  NEGATIVE Final   Performed at [facility], [address], [place], [place] [address]  Admission on 05/19/2023, Discharged on 05/19/2023  Component Date Value Ref Range Status   WBC 05/19/2023 7.1  4.0 - 10.5 K/uL Final   RBC 05/19/2023 4.47  4.22 - 5.81 MIL/uL Final   Hemoglobin 05/19/2023 13.0  13.0 - 17.0 g/dL Final   HCT 51/88/4166 39.2  39.0 - 52.0 % Final   MCV 05/19/2023 87.7  80.0 - 100.0 fL Final   MCH 05/19/2023 29.1  26.0 - 34.0 pg Final   MCHC 05/19/2023 33.2  30.0 - 36.0 g/dL Final   RDW 10/11/1599 12.7  11.5 - 15.5 % Final   Platelets 05/19/2023 236  150 - 400 K/uL Final   nRBC 05/19/2023 0.0  0.0 - 0.2 % Final   Neutrophils Relative % 05/19/2023 74  % Final   Neutro Abs 05/19/2023 5.3  1.7 - 7.7 K/uL Final   Lymphocytes Relative 05/19/2023 16  % Final   Lymphs Abs 05/19/2023 1.1  0.7 - 4.0 K/uL Final   Monocytes Relative 05/19/2023 8  % Final   Monocytes Absolute 05/19/2023 0.6  0.1 - 1.0 K/uL Final   Eosinophils Relative 05/19/2023 1  % Final   Eosinophils Absolute 05/19/2023 0.1  0.0 - 0.5 K/uL Final   Basophils Relative 05/19/2023 1  % Final   Basophils Absolute 05/19/2023 0.0  0.0 - 0.1 K/uL Final   Immature Granulocytes 05/19/2023 0  % Final   Abs Immature Granulocytes 05/19/2023 0.01  0.00 - 0.07 K/uL Final   Performed at [facility], [address] [place], [place] [address]   Sodium 05/19/2023 137  135 - 145 mmol/L Final   Potassium 05/19/2023 3.5  3.5 - 5.1 mmol/L Final   Chloride 05/19/2023 101  98 - 111 mmol/L Final   CO2 05/19/2023 26  22 - 32 mmol/L Final   Glucose, Bld 05/19/2023 111 (H)  70 - 99 mg/dL Final   Glucose reference range applies only to samples taken after fasting for at least 8 hours.   BUN 05/19/2023 10  6 - 20 mg/dL Final   Creatinine, Ser 05/19/2023 0.84  0.61 - 1.24 mg/dL Final   Calcium 55/73/2202 9.3  8.9 - 10.3 mg/dL Final   Total Protein  05/19/2023 6.8  6.5 - 8.1 g/dL Final   Albumin 54/27/0623 4.1  3.5 - 5.0 g/dL Final   AST 82/95/6213 108 (H)  15 - 41 U/L Final   ALT 05/19/2023 49 (H)  0 - 44 U/L Final   Alkaline Phosphatase 05/19/2023 78  38 - 126 U/L Final   Total Bilirubin 05/19/2023 1.2  0.0 - 1.2 mg/dL Final   GFR, Estimated 05/19/2023 >60  >60 mL/min Final   Comment: (NOTE) Calculated using the CKD-EPI Creatinine Equation (2021)    Anion gap 05/19/2023 10  5 - 15 Final   Performed at [facility], [address], [place], [place] [address]   Alcohol, Ethyl (B) 05/19/2023 <10  <10 mg/dL Final   Comment: (NOTE) Lowest detectable limit for serum alcohol is 10 mg/dL.  For medical purposes only. Performed at [facility], [address], [place], [place] [address]     Allergies: Haldol  [haloperidol ]  Medications:  Facility Ordered Medications  Medication   acetaminophen  (TYLENOL ) tablet 650 mg   alum & mag hydroxide-simeth (MAALOX/MYLANTA) 200-200-20 MG/5ML suspension 30 mL   magnesium  hydroxide (MILK OF MAGNESIA) suspension 30 mL   OLANZapine  zydis (ZYPREXA ) disintegrating tablet 5 mg   OLANZapine  (ZYPREXA ) injection 5 mg   OLANZapine  (ZYPREXA ) injection 10 mg   hydrOXYzine  (ATARAX ) tablet 25 mg   [START ON 09/24/2023] nicotine  (NICODERM CQ  - dosed in mg/24 hours) patch 21 mg   OLANZapine  (ZYPREXA ) tablet 5 mg   busPIRone  (BUSPAR ) tablet 10 mg   nicotine  polacrilex (NICORETTE ) gum 2 mg   PTA Medications  Medication Sig   buprenorphine -naloxone  (SUBOXONE ) 8-2 mg SUBL SL tablet Place 2.5 tablets under the tongue daily.   nicotine  (NICODERM CQ  - DOSED IN MG/24 HOURS) 21 mg/24hr patch Place 1 patch (21 mg total) onto the skin daily.   nicotine  polacrilex (NICORETTE ) 2 MG gum Take 1 each (2 mg total) by mouth every 4 (four) hours as needed for smoking cessation.   OLANZapine  (ZYPREXA ) 5 MG tablet Take 1 tablet (5 mg total) by mouth at bedtime.      Medical Decision Making  Based on my  evaluation, pt is recommended for inpatient psychiatric hospitalization for mood stabilization and safety. Pt remains under involuntary commitment at this time and first examination was completed.   Treatment Plan: - Admit to continuous assessment unit until appropriate inpatient bed is found.  - Agitation protocol initiated as policy. - Restart  medications including: Olanzapine  5mg  at bedtime and Buspar  10mg  TID.  Meds ordered this encounter  Medications   acetaminophen  (TYLENOL ) tablet 650 mg   alum & mag hydroxide-simeth (MAALOX/MYLANTA) 200-200-20 MG/5ML suspension 30 mL   magnesium  hydroxide (MILK OF MAGNESIA) suspension 30 mL   OLANZapine  zydis (ZYPREXA ) disintegrating tablet 5 mg   OLANZapine  (ZYPREXA ) injection 5 mg   OLANZapine  (ZYPREXA ) injection 10 mg   hydrOXYzine  (ATARAX ) tablet 25 mg   nicotine  (NICODERM CQ  - dosed in mg/24 hours) patch 21 mg   OLANZapine  (ZYPREXA ) tablet 5 mg   busPIRone  (BUSPAR ) tablet 10 mg   nicotine  polacrilex (NICORETTE ) gum 2 mg   - Labs, EKG and UDS ordered.  Lab Orders         CBC with Differential/Platelet         Comprehensive metabolic panel         Hemoglobin A1c         Ethanol         TSH         POCT Urine Drug Screen - (I-Screen)     EKG -Pt  requesting to restart Suboxone , however, pt unable to provide outpatient clinic prescribing it, has not used any opiates in 90 days because he was in jail and UDS negative for Buprenorphine  and Opiates. He does not display any withdrawal signs or symptoms at this time.  -First examination completed and IVC upheld.   Disposition: Pt accepted to [facility] .    Recommendations  Based on my evaluation the patient does not appear to have an emergency medical condition.  Davia Erps, NP 09/23/23  6:44 PM
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
## 2023-09-23 NOTE — Progress Notes (Signed)
 Pt is admitted to [facility] due to SI with plan to overdose. Pt currently denies and verbally contracts for safety on the unit. Pt was advised to notify staff when having thoughts of hurting self or others. Pt verbalized understanding. Pt is alert and oriented X3. Pt is ambulatory and is oriented to staff/units. Pt was cooperative with labs and skin assessment. Pt complained of chronic back pain. PRN Acetaminophen  and scheduled med administered per order. Pt denies current HI/AVH. Staff will monitor for pt's safety.

## 2023-09-23 NOTE — ED Notes (Signed)
 Patient alert and oriented X 4, patient reports having passive SI due to homelessness. Patient contracts for  safety. Will continue to monitor for safety.

## 2023-09-23 NOTE — ED Notes (Signed)
 Pt has an ankle monitor on his left ankle. Pt is IVCed.

## 2023-09-24 ENCOUNTER — Encounter (HOSPITAL_COMMUNITY): Payer: Self-pay | Admitting: Psychiatry

## 2023-09-24 ENCOUNTER — Encounter (HOSPITAL_COMMUNITY): Payer: Self-pay

## 2023-09-24 ENCOUNTER — Inpatient Hospital Stay (HOSPITAL_COMMUNITY)
Admission: AD | Admit: 2023-09-24 | Discharge: 2023-09-29 | DRG: 885 | Disposition: A | Discharge status: Home / Self Care | Source: Intra-hospital

## 2023-09-24 ENCOUNTER — Other Ambulatory Visit: Payer: Self-pay

## 2023-09-24 DIAGNOSIS — F17210 Nicotine dependence, cigarettes, uncomplicated: Secondary | ICD-10-CM | POA: Diagnosis present

## 2023-09-24 DIAGNOSIS — F1210 Cannabis abuse, uncomplicated: Secondary | ICD-10-CM | POA: Diagnosis present

## 2023-09-24 DIAGNOSIS — Z653 Problems related to other legal circumstances: Secondary | ICD-10-CM | POA: Diagnosis not present

## 2023-09-24 DIAGNOSIS — Z5902 Unsheltered homelessness: Secondary | ICD-10-CM

## 2023-09-24 DIAGNOSIS — Z5941 Food insecurity: Secondary | ICD-10-CM

## 2023-09-24 DIAGNOSIS — F1110 Opioid abuse, uncomplicated: Secondary | ICD-10-CM | POA: Diagnosis present

## 2023-09-24 DIAGNOSIS — Z79899 Other long term (current) drug therapy: Secondary | ICD-10-CM | POA: Diagnosis not present

## 2023-09-24 DIAGNOSIS — Z888 Allergy status to other drugs, medicaments and biological substances status: Secondary | ICD-10-CM

## 2023-09-24 DIAGNOSIS — R45851 Suicidal ideations: Secondary | ICD-10-CM | POA: Diagnosis present

## 2023-09-24 DIAGNOSIS — Z818 Family history of other mental and behavioral disorders: Secondary | ICD-10-CM

## 2023-09-24 DIAGNOSIS — F251 Schizoaffective disorder, depressive type: Principal | ICD-10-CM | POA: Diagnosis present

## 2023-09-24 DIAGNOSIS — I10 Essential (primary) hypertension: Secondary | ICD-10-CM | POA: Diagnosis present

## 2023-09-24 DIAGNOSIS — Z560 Unemployment, unspecified: Secondary | ICD-10-CM

## 2023-09-24 DIAGNOSIS — F4310 Post-traumatic stress disorder, unspecified: Secondary | ICD-10-CM | POA: Diagnosis present

## 2023-09-24 DIAGNOSIS — Z87820 Personal history of traumatic brain injury: Secondary | ICD-10-CM | POA: Diagnosis not present

## 2023-09-24 MED ORDER — ACETAMINOPHEN 325 MG PO TABS
650.0000 mg | ORAL_TABLET | Freq: Four times a day (QID) | ORAL | Status: DC | PRN
  Administered 2023-09-24: 650 mg via ORAL
  Filled 2023-09-24 (×2): qty 2

## 2023-09-24 MED ORDER — ALUM & MAG HYDROXIDE-SIMETH 200-200-20 MG/5ML PO SUSP: 30.0000 mL | ORAL | Status: DC | PRN

## 2023-09-24 MED ORDER — OLANZAPINE 5 MG PO TBDP
5.0000 mg | ORAL_TABLET | Freq: Three times a day (TID) | ORAL | Status: DC | PRN
  Filled 2023-09-24: qty 1

## 2023-09-24 MED ORDER — OLANZAPINE 10 MG IM SOLR: 10.0000 mg | Freq: Three times a day (TID) | INTRAMUSCULAR | Status: DC | PRN

## 2023-09-24 MED ORDER — OLANZAPINE 10 MG IM SOLR: 5.0000 mg | Freq: Three times a day (TID) | INTRAMUSCULAR | Status: DC | PRN

## 2023-09-24 MED ORDER — OLANZAPINE 5 MG PO TABS
5.0000 mg | ORAL_TABLET | Freq: Every day | ORAL | Status: DC
  Filled 2023-09-24: qty 1

## 2023-09-24 MED ORDER — BUSPIRONE HCL 5 MG PO TABS
10.0000 mg | ORAL_TABLET | Freq: Three times a day (TID) | ORAL | Status: DC
  Administered 2023-09-24 – 2023-09-29 (×13): 10 mg via ORAL
  Filled 2023-09-24 (×13): qty 2

## 2023-09-24 MED ORDER — HYDROXYZINE HCL 25 MG PO TABS
25.0000 mg | ORAL_TABLET | Freq: Three times a day (TID) | ORAL | Status: DC | PRN
  Administered 2023-09-24 – 2023-09-25 (×3): 25 mg via ORAL
  Filled 2023-09-24 (×3): qty 1

## 2023-09-24 MED ORDER — NICOTINE 21 MG/24HR TD PT24
21.0000 mg | MEDICATED_PATCH | Freq: Every day | TRANSDERMAL | Status: DC
  Administered 2023-09-24 – 2023-09-29 (×6): 21 mg via TRANSDERMAL
  Filled 2023-09-24 (×6): qty 1

## 2023-09-24 MED ORDER — OLANZAPINE 10 MG PO TABS
10.0000 mg | ORAL_TABLET | Freq: Two times a day (BID) | ORAL | Status: DC
  Administered 2023-09-24 – 2023-09-29 (×7): 10 mg via ORAL
  Filled 2023-09-24 (×7): qty 1

## 2023-09-24 MED ORDER — MAGNESIUM HYDROXIDE 400 MG/5ML PO SUSP: 30.0000 mL | Freq: Every day | ORAL | Status: DC | PRN

## 2023-09-24 MED ORDER — NICOTINE POLACRILEX 2 MG MT GUM
2.0000 mg | CHEWING_GUM | OROMUCOSAL | Status: DC | PRN
  Administered 2023-09-24 – 2023-09-29 (×5): 2 mg via ORAL
  Filled 2023-09-24: qty 1

## 2023-09-24 NOTE — Progress Notes (Signed)
   09/24/23 2200  Psych Admission Type (Psych Patients Only)  Admission Status Involuntary  Psychosocial Assessment  Patient Complaints None  Eye Contact Fair  Facial Expression Animated  Affect Appropriate to circumstance  Speech Logical/coherent  Interaction Assertive  Motor Activity Slow  Appearance/Hygiene Unremarkable  Behavior Characteristics Cooperative;Appropriate to situation  Mood Pleasant  Thought Process  Coherency WDL  Content WDL  Delusions None reported or observed  Perception WDL  Hallucination None reported or observed  Judgment Poor  Confusion None  Danger to Self  Current suicidal ideation? Denies  Agreement Not to Harm Self Yes  Description of Agreement verbal  Danger to Others  Danger to Others None reported or observed

## 2023-09-24 NOTE — Progress Notes (Addendum)
 CSW spoke with patient who stated he was [facility] in an [facility]. CSW gave patient a list of [facility] with current vacancies in [place]. Patient stated he would call to see who had bed availability.  @11 :10am: CSW spoke to patient who stated he called all [facility] and was told no beds were available. Stated he's now interested in [facility] for substance abuse treatment. Referral will be sent to [facility] today.    Nickia Boesen, LCSWA

## 2023-09-24 NOTE — Tx Team (Signed)
 Initial Treatment Plan 09/24/2023 3:23 AM Shirley Douglas UJW:[id number]    PATIENT STRESSORS: Financial difficulties   Occupational concerns     PATIENT STRENGTHS: Ability for insight  Capable of independent living  Communication skills  General fund of knowledge  Motivation for treatment/growth    PATIENT IDENTIFIED PROBLEMS: Homeless  Hx Substance abuse  SI with plan to shot self                 DISCHARGE CRITERIA:  Improved stabilization in mood, thinking, and/or behavior Motivation to continue treatment in a less acute level of care Reduction of life-threatening or endangering symptoms to within safe limits Verbal commitment to aftercare and medication compliance  PRELIMINARY DISCHARGE PLAN: Attend 12-step recovery group Outpatient therapy Placement in alternative living arrangements Referrals indicated:  [facility]  PATIENT/FAMILY INVOLVEMENT: This treatment plan has been presented to and reviewed with the patient, Cody Poole, and/or family member,   The patient and family have been given the opportunity to ask questions and make suggestions.  Filomena Huge, RN 09/24/2023, 3:23 AM

## 2023-09-24 NOTE — Group Note (Signed)
 Recreation Therapy Group Note   Group Topic:Problem Solving  Group Date: 09/24/2023 Start Time: 8295 End Time: 1000 Facilitators: Gursimran Litaker-McCall, LRT,CTRS Location: [address] Dayroom   Group Topic: Problem Solving  Goal Area(s) Addresses:  Patient will effectively work in a team with other group members. Patient will verbalize importance of using appropriate problem solving techniques.   Behavioral Response: Engaged  Intervention: Worksheet  Activity: Dentist. Patients were given two worksheets of brain teasers. Patients got 15 minutes to complete the puzzles. Patients could work with each other if they chose to to figure out what each puzzle was. At the end of the 15 minutes, LRT would go over the answers with the group.     Education: Journalist, newspaper, Communication, Team Building  Education Outcome: Acknowledges understanding/In group clarification offered/Needs additional education.    Affect/Mood: Appropriate   Participation Level: Active   Participation Quality: Independent   Behavior: Appropriate   Speech/Thought Process: Focused   Insight: Good   Judgement: Good   Modes of Intervention: Activity   Patient Response to Interventions:  Engaged   Education Outcome:  In group clarification offered    Clinical Observations/Individualized Feedback: Pt was more quiet but into the activity. Pt worked well with peers and was attentive.     Plan: Continue to engage patient in RT group sessions 2-3x/week.   Analisse Randle-McCall, LRT,CTRS 09/24/2023 12:15 PM

## 2023-09-24 NOTE — Progress Notes (Signed)
   09/24/23 1000  Psych Admission Type (Psych Patients Only)  Admission Status Involuntary  Psychosocial Assessment  Patient Complaints None  Eye Contact Fair  Facial Expression Animated  Affect Appropriate to circumstance  Speech Logical/coherent  Interaction Assertive  Motor Activity Slow  Appearance/Hygiene Unremarkable  Behavior Characteristics Cooperative;Appropriate to situation  Mood Pleasant  Thought Process  Coherency WDL  Content WDL  Delusions None reported or observed  Perception WDL  Hallucination None reported or observed  Judgment Poor  Confusion None  Danger to Self  Current suicidal ideation? Denies  Agreement Not to Harm Self Yes  Description of Agreement Verbal  Danger to Others  Danger to Others None reported or observed

## 2023-09-24 NOTE — Group Note (Signed)
 Date:  09/24/2023 Time:  9:52 AM  Group Topic/Focus:  Goals Group:   The focus of this group is to help patients establish daily goals to achieve during treatment and discuss how the patient can incorporate goal setting into their daily lives to aide in recovery.    Participation Level:  Did Not Attend  Participation Quality:    Affect:    Cognitive:    Insight:   Engagement in Group:    Modes of Intervention:    Additional Comments: Pt were aware of group time. Pt refused to attend.  Tita Form 09/24/2023, 9:52 AM

## 2023-09-24 NOTE — Plan of Care (Signed)
   Problem: Education: Goal: Knowledge of [facility] Education information/materials will improve Outcome: Progressing   Problem: Activity: Goal: Interest or engagement in activities will improve Outcome: Progressing   Problem: Coping: Goal: Ability to verbalize frustrations and anger appropriately will improve Outcome: Progressing

## 2023-09-24 NOTE — Plan of Care (Signed)
   Problem: Education: Goal: Emotional status will improve Outcome: Progressing Goal: Verbalization of understanding the information provided will improve Outcome: Progressing

## 2023-09-24 NOTE — Group Note (Signed)
 Date:  09/24/2023 Time:  10:08 PM  Group Topic/Focus:  Goals Group:   The focus of this group is to help patients establish daily goals to achieve during treatment and discuss how the patient can incorporate goal setting into their daily lives to aide in recovery. Wrap-Up Group:   The focus of this group is to help patients review their daily goal of treatment and discuss progress on daily workbooks.    Participation Level:  Active  Participation Quality:  Sharing  Affect:  Appropriate  Cognitive:  Appropriate  Insight: Good  Engagement in Group:  Engaged  Modes of Intervention:  Discussion  Additional Comments:    Joann Mu 09/24/2023, 10:08 PM
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
## 2023-09-24 NOTE — BH IP Treatment Plan (Signed)
 Interdisciplinary Treatment and Diagnostic Plan Update  09/24/2023 Time of Session: 10:35 AM Cody Poole MRN: [id number]  Principal Diagnosis: Schizoaffective disorder, depressive type (HCC)  Secondary Diagnoses: Principal Problem:   Schizoaffective disorder, depressive type (HCC)   Current Medications:  Current Facility-Administered Medications  Medication Dose Route Frequency Provider Last Rate Last Admin   acetaminophen  (TYLENOL ) tablet 650 mg  650 mg Oral Q6H PRN Brent, Amanda C, NP   650 mg at 09/24/23 0813   alum & mag hydroxide-simeth (MAALOX/MYLANTA) 200-200-20 MG/5ML suspension 30 mL  30 mL Oral Q4H PRN Brent, Amanda C, NP       busPIRone  (BUSPAR ) tablet 10 mg  10 mg Oral TID Brent, Amanda C, NP   10 mg at 09/24/23 1656   hydrOXYzine  (ATARAX ) tablet 25 mg  25 mg Oral TID PRN Brent, Amanda C, NP   25 mg at 09/24/23 0814   magnesium  hydroxide (MILK OF MAGNESIA) suspension 30 mL  30 mL Oral Daily PRN Brent, Amanda C, NP       nicotine  (NICODERM CQ  - dosed in mg/24 hours) patch 21 mg  21 mg Transdermal Q0600 Brent, Amanda C, NP   21 mg at 09/24/23 4098   nicotine  polacrilex (NICORETTE ) gum 2 mg  2 mg Oral PRN Brent, Amanda C, NP   2 mg at 09/24/23 1656   OLANZapine  (ZYPREXA ) injection 10 mg  10 mg Intramuscular TID PRN Brent, Amanda C, NP       OLANZapine  (ZYPREXA ) injection 5 mg  5 mg Intramuscular TID PRN Brent, Amanda C, NP       OLANZapine  (ZYPREXA ) tablet 5 mg  5 mg Oral QHS Brent, Amanda C, NP       OLANZapine  zydis (ZYPREXA ) disintegrating tablet 5 mg  5 mg Oral TID PRN Brent, Amanda C, NP       PTA Medications: Medications Prior to Admission  Medication Sig Dispense Refill Last Dose/Taking   buprenorphine -naloxone  (SUBOXONE ) 8-2 mg SUBL SL tablet Place 2.5 tablets under the tongue daily.   Past Month   busPIRone  (BUSPAR ) 10 MG tablet Take 20 mg by mouth 2 (two) times daily.   09/23/2023   mirtazapine (REMERON) 30 MG tablet Take 30 mg by mouth at bedtime.   09/23/2023    OLANZapine  (ZYPREXA ) 5 MG tablet Take 1 tablet (5 mg total) by mouth at bedtime. 30 tablet 0 Taking    Patient Stressors: Financial difficulties   Occupational concerns    Patient Strengths: Ability for insight  Capable of independent living  Communication skills  General fund of knowledge  Motivation for treatment/growth   Treatment Modalities: Medication Management, Group therapy, Case management,  1 to 1 session with clinician, Psychoeducation, Recreational therapy.   Physician Treatment Plan for Primary Diagnosis: Schizoaffective disorder, depressive type (HCC) Long Term Goal(s):     Short Term Goals:    Medication Management: Evaluate patient's response, side effects, and tolerance of medication regimen.  Therapeutic Interventions: 1 to 1 sessions, Unit Group sessions and Medication administration.  Evaluation of Outcomes: Not Progressing  Physician Treatment Plan for Secondary Diagnosis: Principal Problem:   Schizoaffective disorder, depressive type (HCC)  Long Term Goal(s):     Short Term Goals:       Medication Management: Evaluate patient's response, side effects, and tolerance of medication regimen.  Therapeutic Interventions: 1 to 1 sessions, Unit Group sessions and Medication administration.  Evaluation of Outcomes: Not Progressing   RN Treatment Plan for Primary Diagnosis: Schizoaffective disorder, depressive type (HCC) Long Term Goal(s):  Knowledge of disease and therapeutic regimen to maintain health will improve  Short Term Goals: Ability to remain free from injury will improve, Ability to verbalize frustration and anger appropriately will improve, Ability to demonstrate self-control, Ability to participate in decision making will improve, Ability to verbalize feelings will improve, Ability to disclose and discuss suicidal ideas, Ability to identify and develop effective coping behaviors will improve, and Compliance with prescribed medications will  improve  Medication Management: RN will administer medications as ordered by provider, will assess and evaluate patient's response and provide education to patient for prescribed medication. RN will report any adverse and/or side effects to prescribing provider.  Therapeutic Interventions: 1 on 1 counseling sessions, Psychoeducation, Medication administration, Evaluate responses to treatment, Monitor vital signs and CBGs as ordered, Perform/monitor CIWA, COWS, AIMS and Fall Risk screenings as ordered, Perform wound care treatments as ordered.  Evaluation of Outcomes: Not Progressing   LCSW Treatment Plan for Primary Diagnosis: Schizoaffective disorder, depressive type (HCC) Long Term Goal(s): Safe transition to appropriate next level of care at discharge, Engage patient in therapeutic group addressing interpersonal concerns.  Short Term Goals: Engage patient in aftercare planning with referrals and resources, Increase social support, Increase ability to appropriately verbalize feelings, Increase emotional regulation, Facilitate acceptance of mental health diagnosis and concerns, Facilitate patient progression through stages of change regarding substance use diagnoses and concerns, Identify triggers associated with mental health/substance abuse issues, and Increase skills for wellness and recovery  Therapeutic Interventions: Assess for all discharge needs, 1 to 1 time with Social worker, Explore available resources and support systems, Assess for adequacy in community support network, Educate family and significant other(s) on suicide prevention, Complete Psychosocial Assessment, Interpersonal group therapy.  Evaluation of Outcomes: Not Progressing   Progress in Treatment: Attending groups: attended some groups Participating in groups: Yes Taking medication as prescribed: Yes. Toleration medication: Yes. Family/Significant other contact made: patient declined consents Patient understands  diagnosis: Yes. Discussing patient identified problems/goals with staff: Yes. Medical problems stabilized or resolved: Yes. Denies suicidal/homicidal ideation: Yes. Issues/concerns per patient self-inventory: No.  New problem(s) identified:  No  New Short Term/Long Term Goal(s):     medication stabilization, elimination of SI thoughts, development of comprehensive mental wellness plan.   Patient Goals:  Can you help me find a halfway house?  Discharge Plan or Barriers:  detox,  medication stabilization, elimination of SI thoughts, development of comprehensive mental wellness plan.   Reason for Continuation of Hospitalization: Medication stabilization Suicidal ideation  Estimated Length of Stay:  5 - 7 days  Last 3 [place] Suicide Severity Risk Score: Flowsheet Row [facility] ([facility] from 09/23/2023 in [facility] Admission (Discharged[facility]  C-SSRS RISK CATEGORY No Risk Low Risk Moderate Risk    Last PHQ 2/9 Scores:     No data to display          Scribe for Treatment Team: Cody Poole O Anders Hohmann, LCSWA 09/24/2023 6:03 PM
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
## 2023-09-24 NOTE — Progress Notes (Signed)
 Cody Poole is a 27 y.o. male involuntarily admitted for suicide ideation with a plan to shoot self with a gun. Pt reported that he was released from incarceration earlier yesterday. He stated that when he made the suicidal statement he was upset that he might get sent to a drug program without getting Suboxone . Pt reported one previous suicide attempt about 2 years ago via intentional overdose of substances (street drugs.) Hx of Schizophrenia with report that he was taking his prescribed medications until about 2 weeks ago. Unemployed and does not have  a place to go. Pt has been calm and cooperative with admission process, alert and oriented. Denied SI/H,AVS and verbally contracted for safety. Consents signed, skin/belongings search completed and pt oriented to unit. Pt stable at this time. Pt given the opportunity to express concerns and ask questions. Pt given toiletries. Will continue to monitor.

## 2023-09-24 NOTE — H&P (Signed)
 Psychiatric Admission Assessment Adult  Patient Identification: Cody Poole MRN:  [id number] Date of Evaluation:  09/24/2023 Chief Complaint:  Schizoaffective disorder, depressive type (HCC) [F25.1] Principal Diagnosis: Schizoaffective disorder, depressive type (HCC) Diagnosis:  Principal Problem:   Schizoaffective disorder, depressive type (HCC)  CC: Pt presented via police under IVC with suicidality when being sent to a detox facility that doesn't allow suboxone .  Mode of transport to Hospital: police via [facility] Current Outpatient (Home) Medication List: abilify , buspar  PRN medication prior to evaluation: none  ED course: evaluation at [facility] Collateral Information: POA/Legal Guardian:  HPI:  Aaronjames Kelsay is a 28 year old male with pph significant for ptsd, schizophrenia, polysubstance abuse presented via police after a meeting with his parole officer who wanted him to go to a rehab program that did not allow suboxone . Pt reported suicidal ideation with an intent to blow his head off with a gun. He cited a recent history of increasing anxiety and depression both during his previous 90 day stay in prison for drug-related offenses. He feels hopeless and that all his options for housing and social connection are relating to drug use. His mother is theoretically clean, but also uses suboxone  and lives far away in [place]. He reports he has a fiance who is clean, but he cannot live with her. He has no job skills and feels hopeless.   Past Psychiatric Hx: Previous Psych Diagnoses: anxiety, depression, polysubstance abuse, paranoid schizophrenia Prior inpatient treatment: several times Current/prior outpatient treatment: no Prior rehab hx: yes Psychotherapy hx: yes History of suicide: 2 years ago, overdose History of homicide or aggression: denies Psychiatric medication history: zyprexa , buspar , remeron, suboxone  Psychiatric medication compliance history: Neuromodulation history:   Current Psychiatrist: Current therapist:   Substance Abuse Hx: Alcohol: no Tobacco: yes patch ordered Illicit drugs: Cocaine: no Marijuana:yes, most recently yesterday before leaving prison Heroin: yes, frequently, 92 days ago, prior to his 90 day jail stay Opioids: yes, frequently. Methamphetamines: Yes, frequently prior to jail. Hallucinogens: 2 years ago, magic mushroom candy bar Rehab hx: wishes to have rehab  Past Medical History: Medical Diagnoses: cervical spondylosis, hypertension Home Rx: Prior Hosp: Prior Surgeries/Trauma:  Head trauma, LOC, concussions, seizures: SDH from concussion 05/08/2022 Allergies:  Family History: Medical: Psych: Family mental health history includes schizophrenia in his father and bipolar disorder and [name] in his mother.  Psych Rx: unknown SA/HA:No completed suicides in the family were reported,  Substance use family hx: both parents had a history of crack cocaine use.   Social History: Childhood (bring, raised, lives now, parents, siblings, schooling, education): finished 9th grade Abuse: [place] Marital Status: never married, currently engaged Sexual orientation: prefers females Children: denies Employment: odd jobs in past Peer Group: mostly other substance users Housing: none Finances: none Legal: Just got out of prison for drug related offenses. Has ankle monitor Military: denies  Associated Signs/Symptoms: Depression Symptoms:  depressed mood, anhedonia, insomnia, psychomotor agitation, feelings of worthlessness/guilt, difficulty concentrating, hopelessness, suicidal thoughts with specific plan, anxiety, disturbed sleep, (Hypo) Manic Symptoms:  Delusions, Distractibility, Elevated Mood, Impulsivity, Irritable Mood, Anxiety Symptoms:  Social Anxiety, Psychotic Symptoms:  Delusions, PTSD Symptoms: NA Total Time spent with patient: 45 minutes  Is the patient at risk to self? Yes.    Has the patient been a  risk to self in the past 6 months? Yes.    Has the patient been a risk to self within the distant past? Yes.    Is the patient a risk to others? No.  Has  the patient been a risk to others in the past 6 months? Yes.    Has the patient been a risk to others within the distant past? Yes.     [place] Scale:  Flowsheet Row [facility] ([facility]) [facility]23/2023 in [facility] Admission (Discharged[facility]  C-SSRS RISK CATEGORY No Risk Low Risk Moderate Risk     Prior Inpatient Therapy: Yes.   Multiple previous hospitalizations  Prior Outpatient Therapy: No.    Alcohol Screening: 1. How often do you have a drink containing alcohol?: Never 2. How many drinks containing alcohol do you have on a typical day when you are drinking?: 1 or 2 3. How often do you have six or more drinks on one occasion?: Never AUDIT-C Score: 0 4. How often during the last year have you found that you were not able to stop drinking once you had started?: Never 5. How often during the last year have you failed to do what was normally expected from you because of drinking?: Never 7. How often during the last year have you had a feeling of guilt of remorse after drinking?: Never 8. How often during the last year have you been unable to remember what happened the night before because you had been drinking?: Never 9. Have you or someone else been injured as a result of your drinking?: No 10. Has a relative or friend or a doctor or another health worker been concerned about your drinking or suggested you cut down?: No Alcohol Use Disorder Identification Test Final Score (AUDIT): 0 Substance Abuse History in the last 12 months:  Yes.   Consequences of Substance Abuse: Legal Consequences:  Prison last 90 days Family Consequences:  alienation from sister, fighting over  substances Blackouts:  yes Withdrawal Symptoms:   Cramps Diaphoresis Headaches Tremors Previous Psychotropic Medications: Yes  Psychological Evaluations: Yes  Past Medical History:  Past Medical History:  Diagnosis Date   Anxiety    Back pain    Cervical spondylosis    Depression    ETOH abuse    Heroin abuse (HCC)    Hypertension    IV drug abuse (HCC)    Methamphetamine use (HCC)    Opioid abuse with withdrawal (HCC)    Paranoid schizophrenia (HCC)    Polysubstance abuse (HCC)    Subdural hematoma caused by concussion (HCC) 05/08/2022   Fall/Admit to [facility] for observation   Tobacco abuse    History reviewed. No pertinent surgical history. Family History:  Family History  Family history unknown: Yes   Family Psychiatric  History: see above Tobacco Screening:  Social History   Tobacco Use  Smoking Status Every Day  Smokeless Tobacco Never    [facility] Tobacco Counseling     Are you interested in Tobacco Cessation Medications?  No, patient refused Counseled patient on smoking cessation:  Refused/Declined practical counseling Reason Tobacco Screening Not Completed: Patient Refused Screening       Social History:  Social History   Substance and Sexual Activity  Alcohol Use Not Currently     Social History   Substance and Sexual Activity  Drug Use Yes   Types: IV, Cocaine, Amphetamines, Heroin, Marijuana, Methamphetamines    Additional Social History:       Call mom - betsy Gilvin [phone], fiance Cesar Collins [phone]  Allergies:   Allergies  Allergen Reactions   Haldol  [Haloperidol ] Swelling    Tongue swelling  Dysarthria  Relieved with benadryl     Lab Results:  Results for orders placed or performed during the hospital encounter of 09/23/23 (from the past 48 hours)  CBC with Differential/Platelet     Status: Abnormal   Collection Time: 09/23/23  5:32 PM  Result Value Ref Range   WBC 6.0 4.0 - 10.5 K/uL    RBC 4.55 4.22 - 5.81 MIL/uL   Hemoglobin 13.0 13.0 - 17.0 g/dL   HCT 09.8 (L) 11.9 - 14.7 %   MCV 84.2 80.0 - 100.0 fL   MCH 28.6 26.0 - 34.0 pg   MCHC 33.9 30.0 - 36.0 g/dL   RDW 82.9 56.2 - 13.0 %   Platelets 260 150 - 400 K/uL   nRBC 0.0 0.0 - 0.2 %   Neutrophils Relative % 60 %   Neutro Abs 3.6 1.7 - 7.7 K/uL   Lymphocytes Relative 34 %   Lymphs Abs 2.1 0.7 - 4.0 K/uL   Monocytes Relative 5 %   Monocytes Absolute 0.3 0.1 - 1.0 K/uL   Eosinophils Relative 0 %   Eosinophils Absolute 0.0 0.0 - 0.5 K/uL   Basophils Relative 1 %   Basophils Absolute 0.0 0.0 - 0.1 K/uL   Immature Granulocytes 0 %   Abs Immature Granulocytes 0.01 0.00 - 0.07 K/uL    Comment: Performed at [facility], [address], [place], [place] [address]  Comprehensive metabolic panel     Status: Abnormal   Collection Time: 09/23/23  5:32 PM  Result Value Ref Range   Sodium 139 135 - 145 mmol/L   Potassium 3.8 3.5 - 5.1 mmol/L   Chloride 106 98 - 111 mmol/L   CO2 20 (L) 22 - 32 mmol/L   Glucose, Bld 109 (H) 70 - 99 mg/dL    Comment: Glucose reference range applies only to samples taken after fasting for at least 8 hours.   BUN 8 6 - 20 mg/dL   Creatinine, Ser 4.69 0.61 - 1.24 mg/dL   Calcium 9.6 8.9 - 62.9 mg/dL   Total Protein 6.9 6.5 - 8.1 g/dL   Albumin 4.5 3.5 - 5.0 g/dL   AST 13 (L) 15 - 41 U/L   ALT 7 0 - 44 U/L   Alkaline Phosphatase 59 38 - 126 U/L   Total Bilirubin 0.8 0.0 - 1.2 mg/dL   GFR, Estimated >52 >84 mL/min    Comment: (NOTE) Calculated using the CKD-EPI Creatinine Equation (2021)    Anion gap 13 5 - 15    Comment: Performed at [facility], [address], [place], [place] [address]  Hemoglobin A1c     Status: None   Collection Time: 09/23/23  5:32 PM  Result Value Ref Range   Hgb A1c MFr Bld 5.2 4.8 - 5.6 %    Comment: (NOTE) Diagnosis of Diabetes The following HbA1c ranges recommended by the American Diabetes Association (ADA) may be used as an aid in the  diagnosis of diabetes mellitus.  Hemoglobin             Suggested A1C NGSP%              Diagnosis  <5.7                   Non Diabetic  5.7-6.4  Pre-Diabetic  >6.4                   Diabetic  <7.0                   Glycemic control for                       adults with diabetes.     Mean Plasma Glucose 102.54 mg/dL    Comment: Performed at [facility], [address] [place], [place] [address]  Ethanol     Status: None   Collection Time: 09/23/23  5:32 PM  Result Value Ref Range   Alcohol, Ethyl (B) <15 <15 mg/dL    Comment: (NOTE) For medical purposes only. Performed at [facility], [address] [place], [place] [address]   TSH     Status: None   Collection Time: 09/23/23  5:32 PM  Result Value Ref Range   TSH 2.858 0.350 - 4.500 uIU/mL    Comment: Performed by a 3rd Generation assay with a functional sensitivity of <=0.01 uIU/mL. Performed at [facility], [address] [place], [place] [address]   POCT Urine Drug Screen - (I-Screen)     Status: Abnormal   Collection Time: 09/23/23  5:32 PM  Result Value Ref Range   POC Amphetamine UR None Detected NONE DETECTED (Cut Off Level 1000 ng/mL)   POC Secobarbital (BAR) None Detected NONE DETECTED (Cut Off Level 300 ng/mL)   POC Buprenorphine  (BUP) None Detected NONE DETECTED (Cut Off Level 10 ng/mL)   POC Oxazepam (BZO) None Detected NONE DETECTED (Cut Off Level 300 ng/mL)   POC Cocaine UR None Detected NONE DETECTED (Cut Off Level 300 ng/mL)   POC Methamphetamine UR None Detected NONE DETECTED (Cut Off Level 1000 ng/mL)   POC Morphine None Detected NONE DETECTED (Cut Off Level 300 ng/mL)   POC Methadone UR None Detected NONE DETECTED (Cut Off Level 300 ng/mL)   POC Oxycodone UR None Detected NONE DETECTED (Cut Off Level 100 ng/mL)   POC Marijuana UR Positive (A) NONE DETECTED (Cut Off Level 50 ng/mL)    Blood Alcohol level:  Lab Results  Component Value Date   [facility] <15 09/23/2023    ETH <10 06/08/2023    Metabolic Disorder Labs:  Lab Results  Component Value Date   HGBA1C 5.2 09/23/2023   MPG 102.54 09/23/2023   MPG 108.28 06/11/2023   No results found for: PROLACTIN Lab Results  Component Value Date   CHOL 137 06/11/2023   TRIG 74 06/11/2023   HDL 51 06/11/2023   CHOLHDL 2.7 06/11/2023   VLDL 15 06/11/2023   LDLCALC 71 06/11/2023    Current Medications: Current Facility-Administered Medications  Medication Dose Route Frequency Provider Last Rate Last Admin   acetaminophen  (TYLENOL ) tablet 650 mg  650 mg Oral Q6H PRN Brent, Amanda C, NP   650 mg at 09/24/23 0813   alum & mag hydroxide-simeth (MAALOX/MYLANTA) 200-200-20 MG/5ML suspension 30 mL  30 mL Oral Q4H PRN Brent, Amanda C, NP       busPIRone  (BUSPAR ) tablet 10 mg  10 mg Oral TID Brent, Amanda C, NP   10 mg at 09/24/23 6962   hydrOXYzine  (ATARAX ) tablet 25 mg  25 mg Oral TID PRN Brent, Amanda C, NP   25 mg at 09/24/23 0814   magnesium  hydroxide (MILK OF MAGNESIA) suspension 30 mL  30 mL Oral Daily PRN Fawn Hooks,  Amanda C, NP       nicotine  (NICODERM CQ  - dosed in mg/24 hours) patch 21 mg  21 mg Transdermal Q0600 Brent, Amanda C, NP   21 mg at 09/24/23 4098   nicotine  polacrilex (NICORETTE ) gum 2 mg  2 mg Oral PRN Brent, Amanda C, NP       OLANZapine  (ZYPREXA ) injection 10 mg  10 mg Intramuscular TID PRN Brent, Amanda C, NP       OLANZapine  (ZYPREXA ) injection 5 mg  5 mg Intramuscular TID PRN Brent, Amanda C, NP       OLANZapine  (ZYPREXA ) tablet 5 mg  5 mg Oral QHS Brent, Amanda C, NP       OLANZapine  zydis (ZYPREXA ) disintegrating tablet 5 mg  5 mg Oral TID PRN Brent, Amanda C, NP       PTA Medications: Medications Prior to Admission  Medication Sig Dispense Refill Last Dose/Taking   buprenorphine -naloxone  (SUBOXONE ) 8-2 mg SUBL SL tablet Place 2.5 tablets under the tongue daily.   Past Month   busPIRone  (BUSPAR ) 10 MG tablet Take 20 mg by mouth 2 (two) times daily.   09/23/2023   mirtazapine  (REMERON) 30 MG tablet Take 30 mg by mouth at bedtime.   09/23/2023   OLANZapine  (ZYPREXA ) 5 MG tablet Take 1 tablet (5 mg total) by mouth at bedtime. 30 tablet 0 Taking    AIMS:  ,  ,  ,  ,  ,  ,    Musculoskeletal: Strength & Muscle Tone: within normal limits Gait & Station: unsteady Patient leans: N/A            Psychiatric Specialty Exam:  Presentation  General Appearance:  Casual  Eye Contact: Fair  Speech: Clear and Coherent  Speech Volume: Normal  Handedness: Right   Mood and Affect  Mood: Depressed; Dysphoric  Affect: Congruent; Depressed   Thought Process  Thought Processes: Coherent  Duration of Psychotic Symptoms:long time Past Diagnosis of Schizophrenia or Psychoactive disorder: Yes  Descriptions of Associations:Intact  Orientation:Full (Time, Place and Person)  Thought Content:Paranoid Ideation; Logical  Hallucinations:Hallucinations: None; Auditory; Visual Description of Auditory Hallucinations: narrorating his life Description of Visual Hallucinations: monsters and aliens  Ideas of Reference:Paranoia  Suicidal Thoughts:Suicidal Thoughts: Yes, Active SI Active Intent and/or Plan: With Intent; With Plan  Homicidal Thoughts:Homicidal Thoughts: No   Sensorium  Memory: Recent Fair; Immediate Good  Judgment: Poor  Insight: Fair   Chartered certified accountant: Fair  Attention Span: Fair  Recall: [employer] of Knowledge: Fair  Language: Fair   Psychomotor Activity  Psychomotor Activity: Psychomotor Activity: Normal   Assets  Assets: Manufacturing systems engineer; Desire for Improvement; Financial Resources/Insurance; Physical Health; Resilience   Sleep  Sleep: Sleep: Fair  Estimated Sleeping Duration (Last 24 Hours): 0.00 hours   Physical Exam: Physical Exam Vitals and nursing note reviewed.  Constitutional:      General: He is not in acute distress.    Appearance: Normal appearance.  He is not ill-appearing.  HENT:     Head: Normocephalic and atraumatic.     Nose: Nose normal.  Pulmonary:     Effort: Pulmonary effort is normal.   Skin:    General: Skin is warm and dry.     Capillary Refill: Capillary refill takes less than 2 seconds.   Neurological:     General: No focal deficit present.     Mental Status: He is alert and oriented to person, place, and time.     Sensory: No  sensory deficit.   Psychiatric:        Mood and Affect: Mood normal.        Behavior: Behavior normal.        Thought Content: Thought content normal.        Judgment: Judgment normal.    Review of Systems  Constitutional:  Positive for chills and malaise/fatigue.  HENT: Negative.    Eyes: Negative.   Respiratory: Negative.    Cardiovascular: Negative.   Gastrointestinal:  Positive for nausea. Negative for constipation and diarrhea.  Genitourinary: Negative.   Musculoskeletal: Negative.   Skin: Negative.   Neurological:  Positive for headaches.  Endo/Heme/Allergies: Negative.   Psychiatric/Behavioral:  Positive for depression, substance abuse and suicidal ideas. The patient is nervous/anxious.    Blood pressure 117/83, pulse 80, temperature 98.7 F (37.1 C), temperature source Oral, resp. rate 16, height 5' 11.26 (1.81 m), weight 84.4 kg, SpO2 97%. Body mass index is 25.75 kg/m.  Treatment Plan Summary: Daily contact with patient to assess and evaluate symptoms and progress in treatment and Medication management  Observation Level/Precautions:  15 minute checks  Laboratory:  Vitamin B-12 TSH, RPR, Lipid panel, folate, vitamin d, HIV  Psychotherapy:  could certainly use substance abuse therapy  Medications:   Maalox - for indigestion Buspar  10 mg tid for anxiety Olanzapine  10 mg BID  Consultations:    Discharge Concerns:    Estimated LOS:  Other:     Physician Treatment Plan for Primary Diagnosis: Schizoaffective disorder, depressive type (HCC) Long Term Goal(s):  Improvement in symptoms so as ready for discharge  Short Term Goals: Ability to identify changes in lifestyle to reduce recurrence of condition will improve, Ability to verbalize feelings will improve, Ability to disclose and discuss suicidal ideas, Ability to demonstrate self-control will improve, Ability to identify and develop effective coping behaviors will improve, Compliance with prescribed medications will improve, and Ability to identify triggers associated with substance abuse/mental health issues will improve  Physician Treatment Plan for Secondary Diagnosis: Principal Problem:   Schizoaffective disorder, depressive type (HCC)  Long Term Goal(s): Improvement in symptoms so as ready for discharge  Short Term Goals: Ability to identify changes in lifestyle to reduce recurrence of condition will improve, Ability to verbalize feelings will improve, Ability to disclose and discuss suicidal ideas, Ability to demonstrate self-control will improve, Ability to identify and develop effective coping behaviors will improve, Compliance with prescribed medications will improve, and Ability to identify triggers associated with substance abuse/mental health issues will improve  I certify that inpatient services furnished can reasonably be expected to improve the patient's condition.    Lundy Salisbury, MD 6/13/20258:16 AM

## 2023-09-25 LAB — LIPID PANEL
Cholesterol: 124 mg/dL (ref 0–200)
HDL: 62 mg/dL (ref >40)
LDL Cholesterol: 44 mg/dL (ref 0–99)
Total CHOL/HDL Ratio: 2 ratio
Triglycerides: 89 mg/dL (ref <150)
VLDL: 18 mg/dL (ref 0–40)

## 2023-09-25 LAB — VITAMIN B12: Vitamin B-12: 127 pg/mL — ABNORMAL LOW (ref 180–914)

## 2023-09-25 LAB — VITAMIN D 25 HYDROXY (VIT D DEFICIENCY, FRACTURES): Vit D, 25-Hydroxy: 41.86 ng/mL (ref 30–100)

## 2023-09-25 LAB — FOLATE: Folate: 8.7 ng/mL (ref >5.9)

## 2023-09-25 LAB — TSH: TSH: 2.906 u[IU]/mL (ref 0.350–4.500)

## 2023-09-25 NOTE — [facility] Group Notes (Signed)
 [facility] Group Notes:  (Nursing/MHT/Case Management/Adjunct)  Date:  09/25/2023  Time:  10:41 PM  Type of Therapy:  Psychoeducational Skills  Participation Level:  Did Not Attend  Participation Quality:  Resistant  Affect:  Resistant  Cognitive:  Lacking  Insight:  None  Engagement in Group:  None  Modes of Intervention:  Education  Summary of Progress/Problems: The patient did not attend group this evening.   Cody Poole S 09/25/2023, 10:41 PM

## 2023-09-25 NOTE — Plan of Care (Signed)
  Problem: Education: Goal: Mental status will improve Outcome: Progressing   

## 2023-09-25 NOTE — Plan of Care (Signed)
   Problem: Education: Goal: Knowledge of [facility] Education information/materials will improve Outcome: Progressing Goal: Emotional status will improve Outcome: Progressing Goal: Mental status will improve Outcome: Progressing

## 2023-09-25 NOTE — Progress Notes (Signed)
Pt did not attend goals group. 

## 2023-09-25 NOTE — Plan of Care (Signed)
   Problem: Education: Goal: Emotional status will improve Outcome: Progressing   Problem: Activity: Goal: Interest or engagement in activities will improve Outcome: Progressing

## 2023-09-25 NOTE — Progress Notes (Signed)
   09/25/23 2200  Psych Admission Type (Psych Patients Only)  Admission Status Involuntary  Psychosocial Assessment  Patient Complaints None  Eye Contact Fair  Facial Expression Animated  Affect Appropriate to circumstance  Speech Logical/coherent  Interaction Assertive  Motor Activity Slow  Appearance/Hygiene Unremarkable  Behavior Characteristics Cooperative  Mood Pleasant  Thought Process  Coherency WDL  Content WDL  Delusions None reported or observed  Perception WDL  Hallucination None reported or observed  Judgment Poor  Confusion None  Danger to Self  Current suicidal ideation? Denies  Agreement Not to Harm Self Yes  Description of Agreement verbal  Danger to Others  Danger to Others None reported or observed

## 2023-09-25 NOTE — Progress Notes (Signed)
 [facility] MD Progress Note 09/25/2023 7:48 AM Cody Poole  MRN:  [id number] Principal Problem: Schizoaffective disorder, depressive type (HCC) Diagnosis: Principal Problem:   Schizoaffective disorder, depressive type (HCC)   Subjective:   Cody Poole is a 28 year old male with pph significant for ptsd, schizophrenia, polysubstance abuse presented via police after a meeting with his parole officer who wanted him to go to a rehab program that did not allow suboxone . Pt reported suicidal ideation with an intent to blow his head off with a gun.   Case was discussed in the multidisciplinary team. MAR was reviewed and patient is compliant with medications.   Interval History: Psychiatric Team made the following recommendations yesterday:  Start Maalox - for indigestion Continue home Buspar  10 mg tid for anxiety Start hydroxyzine  25 mg TID PRN for acute anxiety Increase Olanzapine  10 mg BID for schizoaffective disorder  PRNs in last 24 hours: Med    Dose   Date Time(s)  Hydroxyzine   25 mg  6/13   Per nursing staff:  Patient is childlike and needing frequent reassurance.  Wants everything to be done for him, minimal sense of responsibility.  Per Patient:  On assessment today, the patient reports fair sleep, significant anxiety high depression.  Patient repeatedly asked provider to find him a new place, to fix his problems, to write him a letter to get disability.  Informed him that he needs to take ownership for finding a long term rehab program that suits his specific desires.  Pointed out that his desire for Suboxone , while medically indicated, makes placement in a residential long-term rehab program significantly more challenging.  Patient has childlike understanding of this.   Sleep: Fair Appetite:  Good Depression: Rated as high Anxiety: Rated as moderate Auditory Hallucinations: Denies Visual Hallucinations: Denies Paranoia: Denies HI: Denies SI: Endorses conditional SI (if discharged  to the street says he will kill himself immediately) Side effects from medications: does not endorse any side-effects they attribute to medications. Other concerns discussed with patient:  Review of Systems  Constitutional: Negative.   HENT: Negative.    Eyes: Negative.   Respiratory: Negative.    Cardiovascular: Negative.   Gastrointestinal: Negative.   Skin: Negative.   Neurological: Negative.   Psychiatric/Behavioral:  Positive for depression, substance abuse and suicidal ideas. The patient is nervous/anxious.     Total time spent with patient: 30 minutes  Past Psychiatric Hx: Previous Psych Diagnoses: anxiety, depression, polysubstance abuse, paranoid schizophrenia Prior inpatient treatment: several times Current/prior outpatient treatment: no Prior rehab hx: yes Psychotherapy hx: yes History of suicide: 2 years ago, overdose History of homicide or aggression: denies Psychiatric medication history: zyprexa , buspar , remeron, suboxone  Psychiatric medication compliance history: Neuromodulation history:  Current Psychiatrist: Current therapist:    Substance Abuse Hx: Alcohol: no Tobacco: yes patch ordered Illicit drugs: Cocaine: no Marijuana:yes, most recently yesterday before leaving prison Heroin: yes, frequently, 92 days ago, prior to his 90 day jail stay Opioids: yes, frequently. Methamphetamines: Yes, frequently prior to jail. Hallucinogens: 2 years ago, magic mushroom candy bar Rehab hx: wishes to have rehab   Past Medical History: Medical Diagnoses: cervical spondylosis, hypertension Home Rx: Prior Hosp: Prior Surgeries/Trauma:  Head trauma, LOC, concussions, seizures: SDH from concussion 05/08/2022 Allergies:   Family History: Medical: Psych: Family mental health history includes schizophrenia in his father and bipolar disorder and [name] in his mother.  Psych Rx: unknown SA/HA:No completed suicides in the family were reported,  Substance use family hx:  both parents had a  history of crack cocaine use.    Social History: Childhood (bring, raised, lives now, parents, siblings, schooling, education): finished 9th grade Abuse: [place] Marital Status: never married, currently engaged Sexual orientation: prefers females Children: denies Employment: odd jobs in past Peer Group: mostly other substance users Housing: none Finances: none Legal: Just got out of prison for drug related offenses. Has ankle monitor Military: denies  Past Medical History (auto-populated):  Past Medical History:  Diagnosis Date   Anxiety    Back pain    Cervical spondylosis    Depression    ETOH abuse    Heroin abuse (HCC)    Hypertension    IV drug abuse (HCC)    Methamphetamine use (HCC)    Opioid abuse with withdrawal (HCC)    Paranoid schizophrenia (HCC)    Polysubstance abuse (HCC)    Subdural hematoma caused by concussion (HCC) 05/08/2022   Fall/Admit to [facility] for observation   Tobacco abuse     History reviewed. No pertinent surgical history.  Social History:  Social History   Substance and Sexual Activity  Alcohol Use Not Currently     Social History   Substance and Sexual Activity  Drug Use Yes   Types: IV, Cocaine, Amphetamines, Heroin, Marijuana, Methamphetamines    Social History   Socioeconomic History   Marital status: Single    Spouse name: Not on file   Number of children: Not on file   Years of education: Not on file   Highest education level: Not on file  Occupational History   Not on file  Tobacco Use   Smoking status: Every Day   Smokeless tobacco: Never  Vaping Use   Vaping status: Never Used  Substance and Sexual Activity   Alcohol use: Not Currently   Drug use: Yes    Types: IV, Cocaine, Amphetamines, Heroin, Marijuana, Methamphetamines   Sexual activity: Not on file    Comment: heroin, crack cocaine  Other Topics Concern   Not on file  Social History Narrative   Not on file   Social  Drivers of Health   Financial Resource Strain: Not on file  Food Insecurity: Patient Declined (09/24/2023)   Hunger Vital Sign    Worried About Running Out of Food in the Last Year: Patient declined    Ran Out of Food in the Last Year: Patient declined  Recent Concern: Food Insecurity - Food Insecurity Present (09/23/2023)   Hunger Vital Sign    Worried About Running Out of Food in the Last Year: Often true    Ran Out of Food in the Last Year: Often true  Transportation Needs: Patient Declined (09/24/2023)   PRAPARE - Administrator, Civil Service (Medical): Patient declined    Lack of Transportation (Non-Medical): Patient declined  Physical Activity: Not on file  Stress: Not on file  Social Connections: Patient Declined (09/24/2023)   Social Connection and Isolation Panel    Frequency of Communication with Friends and Family: Patient declined    Frequency of Social Gatherings with Friends and Family: Patient declined    Attends Religious Services: Patient declined    Database administrator or Organizations: Patient declined    Attends Engineer, structural: Patient declined    Marital Status: Patient declined   Additional Social History:  Objective: Medications, Labs, Mental Status Exam, Physical Exam Current Medications: Current Facility-Administered Medications  Medication Dose Route Frequency Provider Last Rate Last Admin   acetaminophen  (TYLENOL ) tablet 650 mg  650 mg Oral Q6H PRN Brent, Amanda C, NP   650 mg at 09/24/23 0813   alum & mag hydroxide-simeth (MAALOX/MYLANTA) 200-200-20 MG/5ML suspension 30 mL  30 mL Oral Q4H PRN Davia Erps, NP       busPIRone  (BUSPAR ) tablet 10 mg  10 mg Oral TID Davia Erps, NP   10 mg at 09/24/23 1656   hydrOXYzine  (ATARAX ) tablet 25 mg  25 mg Oral TID PRN Brent, Amanda C, NP   25 mg at 09/24/23 2058   magnesium  hydroxide (MILK OF MAGNESIA) suspension 30 mL  30 mL Oral Daily PRN Davia Erps, NP       nicotine   (NICODERM CQ  - dosed in mg/24 hours) patch 21 mg  21 mg Transdermal Q0600 Brent, Amanda C, NP   21 mg at 09/24/23 0981   nicotine  polacrilex (NICORETTE ) gum 2 mg  2 mg Oral PRN Davia Erps, NP   2 mg at 09/24/23 1656   OLANZapine  (ZYPREXA ) injection 10 mg  10 mg Intramuscular TID PRN Davia Erps, NP       OLANZapine  (ZYPREXA ) injection 5 mg  5 mg Intramuscular TID PRN Brent, Amanda C, NP       OLANZapine  (ZYPREXA ) tablet 10 mg  10 mg Oral BID Lundy Salisbury, MD   10 mg at 09/24/23 2057   OLANZapine  zydis (ZYPREXA ) disintegrating tablet 5 mg  5 mg Oral TID PRN Davia Erps, NP        Lab Results:  Results for orders placed or performed during the hospital encounter of 09/23/23 (from the past 48 hours)  CBC with Differential/Platelet     Status: Abnormal   Collection Time: 09/23/23  5:32 PM  Result Value Ref Range   WBC 6.0 4.0 - 10.5 K/uL   RBC 4.55 4.22 - 5.81 MIL/uL   Hemoglobin 13.0 13.0 - 17.0 g/dL   HCT 19.1 (L) 47.8 - 29.5 %   MCV 84.2 80.0 - 100.0 fL   MCH 28.6 26.0 - 34.0 pg   MCHC 33.9 30.0 - 36.0 g/dL   RDW 62.1 30.8 - 65.7 %   Platelets 260 150 - 400 K/uL   nRBC 0.0 0.0 - 0.2 %   Neutrophils Relative % 60 %   Neutro Abs 3.6 1.7 - 7.7 K/uL   Lymphocytes Relative 34 %   Lymphs Abs 2.1 0.7 - 4.0 K/uL   Monocytes Relative 5 %   Monocytes Absolute 0.3 0.1 - 1.0 K/uL   Eosinophils Relative 0 %   Eosinophils Absolute 0.0 0.0 - 0.5 K/uL   Basophils Relative 1 %   Basophils Absolute 0.0 0.0 - 0.1 K/uL   Immature Granulocytes 0 %   Abs Immature Granulocytes 0.01 0.00 - 0.07 K/uL    Comment: Performed at [facility], [address] [place], [place] [address]  Comprehensive metabolic panel     Status: Abnormal   Collection Time: 09/23/23  5:32 PM  Result Value Ref Range   Sodium 139 135 - 145 mmol/L   Potassium 3.8 3.5 - 5.1 mmol/L   Chloride 106 98 - 111 mmol/L   CO2 20 (L) 22 - 32 mmol/L   Glucose, Bld 109 (H) 70 - 99 mg/dL    Comment:  Glucose reference range applies only to samples taken after fasting for at least 8 hours.   BUN 8 6 - 20 mg/dL   Creatinine, Ser 2.95 0.61 - 1.24 mg/dL   Calcium 9.6 8.9 -  10.3 mg/dL   Total Protein 6.9 6.5 - 8.1 g/dL   Albumin 4.5 3.5 - 5.0 g/dL   AST 13 (L) 15 - 41 U/L   ALT 7 0 - 44 U/L   Alkaline Phosphatase 59 38 - 126 U/L   Total Bilirubin 0.8 0.0 - 1.2 mg/dL   GFR, Estimated >91 >47 mL/min    Comment: (NOTE) Calculated using the CKD-EPI Creatinine Equation (2021)    Anion gap 13 5 - 15    Comment: Performed at [facility], [address], [place], [place] [address]  Hemoglobin A1c     Status: None   Collection Time: 09/23/23  5:32 PM  Result Value Ref Range   Hgb A1c MFr Bld 5.2 4.8 - 5.6 %    Comment: (NOTE) Diagnosis of Diabetes The following HbA1c ranges recommended by the American Diabetes Association (ADA) may be used as an aid in the diagnosis of diabetes mellitus.  Hemoglobin             Suggested A1C NGSP%              Diagnosis  <5.7                   Non Diabetic  5.7-6.4                Pre-Diabetic  >6.4                   Diabetic  <7.0                   Glycemic control for                       adults with diabetes.     Mean Plasma Glucose 102.54 mg/dL    Comment: Performed at [facility], [address], [place], [place] [address]  Ethanol     Status: None   Collection Time: 09/23/23  5:32 PM  Result Value Ref Range   Alcohol, Ethyl (B) <15 <15 mg/dL    Comment: (NOTE) For medical purposes only. Performed at [facility], [address] [place], [place] [address]   TSH     Status: None   Collection Time: 09/23/23  5:32 PM  Result Value Ref Range   TSH 2.858 0.350 - 4.500 uIU/mL    Comment: Performed by a 3rd Generation assay with a functional sensitivity of <=0.01 uIU/mL. Performed at [facility], [address], [place], [place] [address]   POCT Urine Drug Screen - (I-Screen)     Status: Abnormal   Collection  Time: 09/23/23  5:32 PM  Result Value Ref Range   POC Amphetamine UR None Detected NONE DETECTED (Cut Off Level 1000 ng/mL)   POC Secobarbital (BAR) None Detected NONE DETECTED (Cut Off Level 300 ng/mL)   POC Buprenorphine  (BUP) None Detected NONE DETECTED (Cut Off Level 10 ng/mL)   POC Oxazepam (BZO) None Detected NONE DETECTED (Cut Off Level 300 ng/mL)   POC Cocaine UR None Detected NONE DETECTED (Cut Off Level 300 ng/mL)   POC Methamphetamine UR None Detected NONE DETECTED (Cut Off Level 1000 ng/mL)   POC Morphine None Detected NONE DETECTED (Cut Off Level 300 ng/mL)   POC Methadone UR None Detected NONE DETECTED (Cut Off Level 300 ng/mL)   POC Oxycodone UR None Detected NONE DETECTED (Cut Off Level 100 ng/mL)   POC Marijuana UR Positive (A)  NONE DETECTED (Cut Off Level 50 ng/mL)    Blood Alcohol level:  Lab Results  Component Value Date   [facility] <15 09/23/2023   ETH <10 06/08/2023    Metabolic Disorder Labs: Lab Results  Component Value Date   HGBA1C 5.2 09/23/2023   MPG 102.54 09/23/2023   MPG 108.28 06/11/2023   No results found for: PROLACTIN Lab Results  Component Value Date   CHOL 137 06/11/2023   TRIG 74 06/11/2023   HDL 51 06/11/2023   CHOLHDL 2.7 06/11/2023   VLDL 15 06/11/2023   LDLCALC 71 06/11/2023    Physical Findings: AIMS:  , ,  ,  ,    CIWA:    COWS:     Musculoskeletal: Strength & Muscle Tone: within normal limits Gait & Station: normal Patient leans: N/A  Mental Status Exam: General Appearance: Disheveled  Orientation:  Full (Time, Place, and Person)  Memory:  Immediate;   Fair Recent;   Good Remote;   Good  Concentration:  Concentration: Fair  Recall:  Fair  Attention  Good  Eye Contact:  Good  Speech:  Clear and Coherent and Normal Rate  Language:  Good  Volume:  Normal  Mood: I am really worried about what is going to happen to me  Affect:  Labile and childlike  Thought Process:  Goal Directed and Descriptions of Associations:  Tangential  Thought Content:  Delusions and Rumination  Suicidal Thoughts:  Yes.  with intent/plan  Homicidal Thoughts:  No  Judgement:  Poor  Insight:  Lacking  Psychomotor Activity:  Normal  Akathisia:  No  Fund of Knowledge:  Fair      Assets:  Manufacturing systems engineer Desire for Improvement  Cognition:  Impaired,  Mild  ADL's:  Intact   Physical Exam: Physical Exam  Psychiatric:        Attention and Perception: Attention and perception normal.        Mood and Affect: Mood is anxious. Affect is labile.        Speech: Speech is tangential.        Behavior: Behavior is hyperactive. Behavior is cooperative.        Thought Content: Thought content is not paranoid. Thought content includes suicidal ideation. Thought content does not include homicidal ideation. Thought content includes suicidal plan.        Cognition and Memory: Memory normal. Cognition is impaired.        Judgment: Judgment is impulsive.     Blood pressure 129/89, pulse 63, temperature 97.7 F (36.5 C), temperature source Oral, resp. rate 16, height 5' 11.26 (1.81 m), weight 84.4 kg, SpO2 100%. Body mass index is 25.75 kg/m.  ASSESSMENT: Cody Poole is a 28 year old male past psychiatric history significant for schizoaffective disorder, polysubstance abuse presented from prison to the ED to this facility in the setting of suicidal ideation. Much of his suicidal ideation appears to be conditional.  Differential for this patient includes decompensation of schizoaffective disorder, which would explain his persistent delusions of alien abduction, an earnest desire for substance abuse assistance, and possibly some element of secondary gain from a hospital stay given his homelessness.    Patient remains childlike and attention seeking, disposition will be a challenge due to the patient's desire to be on Suboxone  and in a long-term rehab.  In a perfect world these things would neatly overlap, however this is not that  world.  Patient may have to settle for either a short-term Suboxone  friendly place or a  long-term primarily abstinence focused program.  Diagnoses / Active Problems: #Polysubstance abuse, chronic, in remission due to controlled environment #Schizoaffective disorder, depressive type  PLAN: Safety and Monitoring:  -- Involuntary admission to inpatient psychiatric unit for safety, stabilization and treatment  -- Daily contact with patient to assess and evaluate symptoms and progress in treatment  -- Patient's case to be discussed in multi-disciplinary team meeting  -- Observation Level : q15 minute checks  -- Vital signs:  q12 hours  -- Precautions: suicide, elopement, and assault  2. Psychiatric Diagnoses and Treatment:  Continue Maalox - for indigestion Continue home Buspar  10 mg tid for anxiety Start hydroxyzine  25 mg TID PRN for acute anxiety Continue Olanzapine  10 mg BID for schizoaffective disorder  -- The risks/benefits/side-effects/alternatives to this medication were discussed in detail with the patient and time was given for questions. The patient consents to medication trial.   -- Metabolic profile and EKG monitoring obtained while on an atypical antipsychotic - Body mass index is 25.75 kg/m.,  - Lipid Panel: - Hbg A1c: 5.2 (09/23/2023) - QTc: 386 ms, last obtained 09/23/2023  -- Encouraged patient to participate in unit milieu and in scheduled group therapies   -- Short Term Goals: Ability to identify changes in lifestyle to reduce recurrence of condition will improve, Ability to disclose and discuss suicidal ideas, Ability to demonstrate self-control will improve, and Ability to identify triggers associated with substance abuse/mental health issues will improve  -- Long Term Goals: Improvement in symptoms so as ready for discharge    3. Medical Issues Being Addressed:    Labs reviewed, notable for NA, otherwise unremarkable   Tobacco Use Disorder  --  Patient in need of  nicotine  replacement; nicotine  polacrilex (gum) and nicotine  patch 21 mg / 24 hours ordered. Smoking cessation encouraged  4. Discharge Planning:   -- Social work and case management to assist with discharge planning and identification of hospital follow-up needs prior to discharge  -- Estimated LOS: 5-7 days  -- Discharge Concerns: Need to establish a safety plan; Medication compliance and effectiveness  -- Discharge Goals: Return home with outpatient referrals for mental health follow-up including medication management/psychotherapy   Lundy Salisbury, MD 09/25/2023, 7:48 AM

## 2023-09-25 NOTE — Progress Notes (Signed)
   09/25/23 1300  Psych Admission Type (Psych Patients Only)  Admission Status Involuntary  Psychosocial Assessment  Patient Complaints None  Eye Contact Fair  Facial Expression Animated  Affect Appropriate to circumstance  Speech Logical/coherent  Interaction Assertive  Motor Activity Slow  Appearance/Hygiene Unremarkable  Behavior Characteristics Cooperative  Mood Pleasant  Thought Process  Coherency WDL  Content WDL  Delusions None reported or observed  Perception WDL  Hallucination None reported or observed  Judgment Poor  Confusion None  Danger to Self  Current suicidal ideation? Denies  Agreement Not to Harm Self Yes  Description of Agreement Verbal  Danger to Others  Danger to Others None reported or observed

## 2023-09-26 LAB — HIV ANTIBODY (ROUTINE TESTING W REFLEX): HIV Screen 4th Generation wRfx: NONREACTIVE

## 2023-09-26 LAB — RPR: RPR Ser Ql: NONREACTIVE

## 2023-09-26 NOTE — Progress Notes (Signed)
 [facility] MD Progress Note  09/26/2023 3:46 PM Rebecca Motta  MRN:  [id number] Subjective:  Chart reviewed. No significant events overnight. Patient has been med compliant. No behavioral issues. Patient has been attending some groups. He was in his room most of the morning. He reported sleeping well last night. He denies any significant medication side effects. He reports that his mood is euthymic to mildly depressed. He is anxious about his dispisition as he is homeless and reports that he recently was released from prison. He reports a long history of substance abuse, active mild to moderate drug cravings, and he would like to restart buprenorphine . He reports previously taking about 24 mg daily. He reports a history of diagnosed schizophrenia that manifests with paranoia and AVH. He reports that he can see shapes and monsters and that he also experiences a running commentary. He denies cAH. He reports that the [facility] has largely resolved since starting olanzapine  but reports ongoing AH, which he does not find very distressing. He denies SI. He states that he desperately wants to get into a 28-day program or any sort of supported living environment. We discussed his current hospitalization and how he was initially involuntarily detained. He stated that he wants to remain in the hospital and that he would like to convert to a voluntary admission.    Principal Problem: Schizoaffective disorder, depressive type (HCC) Diagnosis: Principal Problem:   Schizoaffective disorder, depressive type (HCC)  Total Time spent with patient: 20 minutes    Past Medical History:  Past Medical History:  Diagnosis Date   Anxiety    Back pain    Cervical spondylosis    Depression    ETOH abuse    Heroin abuse (HCC)    Hypertension    IV drug abuse (HCC)    Methamphetamine use (HCC)    Opioid abuse with withdrawal (HCC)    Paranoid schizophrenia (HCC)    Polysubstance abuse (HCC)    Subdural hematoma caused by concussion  (HCC) 05/08/2022   Fall/Admit to [facility] for observation   Tobacco abuse    History reviewed. No pertinent surgical history.    Current Medications: Current Facility-Administered Medications  Medication Dose Route Frequency Provider Last Rate Last Admin   acetaminophen  (TYLENOL ) tablet 650 mg  650 mg Oral Q6H PRN Brent, Amanda C, NP   650 mg at 09/24/23 0813   alum & mag hydroxide-simeth (MAALOX/MYLANTA) 200-200-20 MG/5ML suspension 30 mL  30 mL Oral Q4H PRN Brent, Amanda C, NP       busPIRone  (BUSPAR ) tablet 10 mg  10 mg Oral TID Brent, Amanda C, NP   10 mg at 09/26/23 4098   hydrOXYzine  (ATARAX ) tablet 25 mg  25 mg Oral TID PRN Brent, Amanda C, NP   25 mg at 09/25/23 2033   magnesium  hydroxide (MILK OF MAGNESIA) suspension 30 mL  30 mL Oral Daily PRN Brent, Amanda C, NP       nicotine  (NICODERM CQ  - dosed in mg/24 hours) patch 21 mg  21 mg Transdermal Q0600 Brent, Amanda C, NP   21 mg at 09/26/23 0802   nicotine  polacrilex (NICORETTE ) gum 2 mg  2 mg Oral PRN Brent, Amanda C, NP   2 mg at 09/26/23 1453   OLANZapine  (ZYPREXA ) injection 10 mg  10 mg Intramuscular TID PRN Brent, Amanda C, NP       OLANZapine  (ZYPREXA ) injection 5 mg  5 mg Intramuscular TID PRN Brent, Amanda C, NP  OLANZapine  (ZYPREXA ) tablet 10 mg  10 mg Oral BID Lundy Salisbury, MD   10 mg at 09/26/23 2956   OLANZapine  zydis (ZYPREXA ) disintegrating tablet 5 mg  5 mg Oral TID PRN Brent, Amanda C, NP        Lab Results:  Results for orders placed or performed during the hospital encounter of 09/24/23 (from the past 48 hours)  Folate     Status: None   Collection Time: 09/25/23  6:32 PM  Result Value Ref Range   Folate 8.7 >5.9 ng/mL    Comment: Performed at [facility], [address], [place], [place] [address]  HIV Antibody (routine testing w rflx)     Status: None   Collection Time: 09/25/23  6:32 PM  Result Value Ref Range   HIV Screen 4th Generation wRfx Non  Reactive Non Reactive    Comment: Performed at [facility], [address], [place], [place] [address]  RPR     Status: None   Collection Time: 09/25/23  6:32 PM  Result Value Ref Range   RPR Ser Ql NON REACTIVE NON REACTIVE    Comment: Performed at [facility], [address] [place], [place] [address]  VITAMIN D 25 Hydroxy (Vit-D Deficiency, Fractures)     Status: None   Collection Time: 09/25/23  6:32 PM  Result Value Ref Range   Vit D, 25-Hydroxy 41.86 30 - 100 ng/mL    Comment: (NOTE) Vitamin D deficiency has been defined by the Institute of Medicine  and an Endocrine Society practice guideline as a level of serum 25-OH  vitamin D less than 20 ng/mL (1,2). The Endocrine Society went on to  further define vitamin D insufficiency as a level between 21 and 29  ng/mL (2).  1. IOM (Institute of Medicine). 2010. Dietary reference intakes for  calcium and D. [place]: [employer]. 2. Holick MF, Binkley Stoddard, Bischoff-Ferrari HA, et al. Evaluation,  treatment, and prevention of vitamin D deficiency: an Endocrine  Society clinical practice guideline, JCEM. 2011 Jul; 96(7): 1911-30.  Performed at [facility], [address] [place], [place] [address]   TSH     Status: None   Collection Time: 09/25/23  6:32 PM  Result Value Ref Range   TSH 2.906 0.350 - 4.500 uIU/mL    Comment: Performed by a 3rd Generation assay with a functional sensitivity of <=0.01 uIU/mL. Performed at [facility], [address], [place], [place] [address]   Vitamin B12     Status: Abnormal   Collection Time: 09/25/23  6:32 PM  Result Value Ref Range   Vitamin B-12 127 (L) 180 - 914 pg/mL    Comment: (NOTE) This assay is not validated for testing neonatal or myeloproliferative syndrome specimens for Vitamin B12 levels. Performed at [facility], [address] [place], [place] [address]   Lipid panel     Status: None    Collection Time: 09/25/23  6:32 PM  Result Value Ref Range   Cholesterol 124 0 - 200 mg/dL   Triglycerides 89 <536 mg/dL   HDL 62 >64 mg/dL   Total CHOL/HDL Ratio 2.0 RATIO   VLDL 18 0 - 40 mg/dL   LDL Cholesterol 44 0 - 99 mg/dL    Comment:        Total Cholesterol/HDL:CHD Risk Coronary Heart Disease Risk Table  Men   Women  1/2 Average Risk   3.4   3.3  Average Risk       5.0   4.4  2 X Average Risk   9.6   7.1  3 X Average Risk  23.4   11.0        Use the calculated Patient Ratio above and the CHD Risk Table to determine the patient's CHD Risk.        ATP III CLASSIFICATION (LDL):  <100     mg/dL   Optimal  161-096  mg/dL   Near or Above                    Optimal  130-159  mg/dL   Borderline  045-409  mg/dL   High  >811     mg/dL   Very High Performed at [facility], [address] [place], [place] [address]     Blood Alcohol level:  Lab Results  Component Value Date   [facility] <15 09/23/2023   ETH <10 06/08/2023    Metabolic Disorder Labs: Lab Results  Component Value Date   HGBA1C 5.2 09/23/2023   MPG 102.54 09/23/2023   MPG 108.28 06/11/2023   No results found for: PROLACTIN Lab Results  Component Value Date   CHOL 124 09/25/2023   TRIG 89 09/25/2023   HDL 62 09/25/2023   CHOLHDL 2.0 09/25/2023   VLDL 18 09/25/2023   LDLCALC 44 09/25/2023   LDLCALC 71 06/11/2023     Musculoskeletal: Normal gait and station.  Mental Status Exam: Appearance - Casually dressed, adequate hygiene, he has an ankle monitor on his left foot. Behavior - Calm, no psychomotor agitation Attitude - Polite Mood - Okay Affect - Restricted Thought Process - Linear, GD  Thought Content - No delusions expressed; perseverates on wanting to go to rehab SI/HI - denies Perceptions - Endorses AH in the form of a running commentary; not RIS Judgement/Insight - Poor to fair Fund of knowledge - estimated below average Language - No  impairments   Assets  Assets: Manufacturing systems engineer; Desire for Improvement; Financial Resources/Insurance; Physical Health; Resilience   Physical Exam: Physical Exam Vitals and nursing note reviewed.    ROS Blood pressure 128/86, pulse 80, temperature 98.5 F (36.9 C), temperature source Oral, resp. rate 16, height 5' 11.26 (1.81 m), weight 84.4 kg, SpO2 99%. Body mass index is 25.75 kg/m.   Assessment and Plan: Mr. Lenox Bink is a 28 y/o male with a history of previously diagnosed schizophrenia/schizoaffective disorder and polysubstance abuse who was initially admitted involuntarily on 09/24/23 after he expressed suicidal ideation in the context of being told that he would not be sent to a detoxification facility that would provide treatment with buprenorphine .  Since admission he has been started on olanzapine  for psychotic symptoms and reports improvement. He continues to fixate on going to an inpatient treatment center, and this appears at least partially motivated by homelessness.  # Schizophrenia/schizoaffective disorder, depressive type - Improving with olanzapine  10 mg BID and buspirone  10 mg TID  # Polysubstance abuse / Opioid Use Disorder - Continue to explore SUD placement options - Consider initiating treatment with buprenorphine . He has received treatment with this in the past. I will defer to the oncoming provider.   # Disposition - High risk for readmission if discharged to homelessness. SUD treatment would be preferable to minimize readmission risk. - Patient admitted involuntarily, but requests to transition to voluntary.   Veda Gerald  Florinda Hush, MD 09/26/2023, 3:46 PM

## 2023-09-26 NOTE — Group Note (Signed)
 Date:  09/26/2023 Time:  9:10 PM  Group Topic/Focus:  Wrap-Up Group:   The focus of this group is to help patients review their daily goal of treatment and discuss progress on daily workbooks.    Additional Comments:  Pt was encouraged, but opted out of attending wrap up group this evening.   Eligah Grow 09/26/2023, 9:10 PM

## 2023-09-26 NOTE — Progress Notes (Signed)
   09/26/23 1700  Psych Admission Type (Psych Patients Only)  Admission Status Involuntary  Psychosocial Assessment  Patient Complaints None  Eye Contact Fair  Facial Expression Animated  Affect Appropriate to circumstance  Speech Logical/coherent  Interaction Assertive  Motor Activity Other (Comment) (WNL)  Appearance/Hygiene Unremarkable  Behavior Characteristics Cooperative  Mood Pleasant  Thought Process  Coherency WDL  Content WDL  Delusions None reported or observed  Perception WDL  Hallucination None reported or observed  Judgment Impaired  Confusion None  Danger to Self  Current suicidal ideation? Denies  Agreement Not to Harm Self Yes  Description of Agreement Verbalizes  Danger to Others  Danger to Others None reported or observed

## 2023-09-26 NOTE — Group Note (Signed)
 LCSW Group Therapy Note  Group Date: 09/26/2023 Start Time: 1100 End Time: 1200   Type of Therapy and Topic:  Group Therapy - Healthy vs Unhealthy Coping Skills  Participation Level:  Active   Description of Group The focus of this group was to determine what unhealthy coping techniques typically are used by group members and what healthy coping techniques would be helpful in coping with various problems. Patients were guided in becoming aware of the differences between healthy and unhealthy coping techniques. Patients were asked to identify 2-3 healthy coping skills they would like to learn to use more effectively.  Therapeutic Goals Patients learned that coping is what human beings do all day long to deal with various situations in their lives Patients defined and discussed healthy vs unhealthy coping techniques Patients identified their preferred coping techniques and identified whether these were healthy or unhealthy Patients determined 2-3 healthy coping skills they would like to become more familiar with and use more often. Patients provided support and ideas to each other   Summary of Patient Progress: During group, patient expressed openly. Patient proved open to input from peers and feedback from CSW. Patient demonstrated proficient insight into the subject matter, was respectful of peers, and participated throughout the entire session.   Therapeutic Modalities Cognitive Behavioral Therapy Motivational Interviewing  Roselle Conner, LCSWA 09/26/2023  11:45 AM

## 2023-09-26 NOTE — Group Note (Signed)
 Date:  09/26/2023 Time:  9:00 AM  Group Topic/Focus:  Goals Group:   The focus of this group is to help patients establish daily goals to achieve during treatment and discuss how the patient can incorporate goal setting into their daily lives to aide in recovery. Orientation:   The focus of this group is to educate the patient on the purpose and policies of crisis stabilization and provide a format to answer questions about their admission.  The group details unit policies and expectations of patients while admitted.    Participation Level:  Did Not Attend

## 2023-09-26 NOTE — Plan of Care (Signed)
   Problem: Education: Goal: Knowledge of [facility] Education information/materials will improve Outcome: Progressing Goal: Emotional status will improve Outcome: Progressing Goal: Verbalization of understanding the information provided will improve Outcome: Progressing   Problem: Activity: Goal: Interest or engagement in activities will improve Outcome: Progressing

## 2023-09-27 DIAGNOSIS — F251 Schizoaffective disorder, depressive type: Secondary | ICD-10-CM | POA: Diagnosis not present

## 2023-09-27 NOTE — Plan of Care (Signed)
  Problem: Coping: Goal: Ability to demonstrate self-control will improve Outcome: Progressing   Problem: Safety: Goal: Periods of time without injury will increase Outcome: Progressing   Problem: Education: Goal: Emotional status will improve Outcome: Progressing

## 2023-09-27 NOTE — Progress Notes (Signed)
 [facility] MD Progress Note  09/27/2023 7:43 PM Cody Poole  MRN:  [id number] Subjective:   28 year old [name] male, single, unemployed, homeless.  Background history of substance use disorder and schizoaffective disorder.  Recently released from jail after 90 days.  Currently on parole.  Presented on account of worsening depression associated with suicidal thoughts.  Expressed thoughts of shooting himself with a gun. Routine labs are essentially normal.  No alcohol or any psychoactive substance on board.  I assumed care of this patient today.  Chart reviewed.  Patient discussed at multidisciplinary team meeting.  Nursing staff reports that patient has been mostly isolative in his room.  He slept for 11 hours.  He has been adherent with his medications.  No challenging behavior.  No PRNs required.  Social worker is actively helping him seek out housing/addiction treatment in the community.  Seen today.  Patient states that he was incarcerated because he violated parole by using psychoactive substances.  States that he used heroin and methamphetamine and cocaine at that time.  He was psychotic at that time.  Now that he is out of jail, he is wearing an ankle monitor.  States that he is still on parole.  He does not have any resources in the community.  States that he does not have housing.  Patient is very focused on housing.  States that he is interested in a halfway house, inpatient rehab or any program in the community that will help housing.  He reports limited psychosocial support.  He is not endorsing any current suicidal thoughts.  He is not endorsing any rageful thoughts towards others or to property.  No psychotic symptoms.  No manic symptoms.  No adverse effects from his medications.  No current craving for psychoactive substance. Encouraged to keep ventilating his feelings to staff.  Principal Problem: Schizoaffective disorder, depressive type (HCC) Diagnosis: Principal Problem:    Schizoaffective disorder, depressive type (HCC)  Total Time spent with patient: 20 minutes  Past Psychiatric History:  See H&P  Past Medical History:  Past Medical History:  Diagnosis Date   Anxiety    Back pain    Cervical spondylosis    Depression    ETOH abuse    Heroin abuse (HCC)    Hypertension    IV drug abuse (HCC)    Methamphetamine use (HCC)    Opioid abuse with withdrawal (HCC)    Paranoid schizophrenia (HCC)    Polysubstance abuse (HCC)    Subdural hematoma caused by concussion (HCC) 05/08/2022   Fall/Admit to [facility] for observation   Tobacco abuse    History reviewed. No pertinent surgical history. Family History:  Family History  Family history unknown: Yes   Family Psychiatric  History:  See H&P  Social History:  Social History   Substance and Sexual Activity  Alcohol Use Not Currently     Social History   Substance and Sexual Activity  Drug Use Yes   Types: IV, Cocaine, Amphetamines, Heroin, Marijuana, Methamphetamines    Social History   Socioeconomic History   Marital status: Single    Spouse name: Not on file   Number of children: Not on file   Years of education: Not on file   Highest education level: Not on file  Occupational History   Not on file  Tobacco Use   Smoking status: Every Day   Smokeless tobacco: Never  Vaping Use   Vaping status: Never Used  Substance and Sexual Activity   Alcohol use: Not  Currently   Drug use: Yes    Types: IV, Cocaine, Amphetamines, Heroin, Marijuana, Methamphetamines   Sexual activity: Not on file    Comment: heroin, crack cocaine  Other Topics Concern   Not on file  Social History Narrative   Not on file   Social Drivers of Health   Financial Resource Strain: Not on file  Food Insecurity: Patient Declined (09/24/2023)   Hunger Vital Sign    Worried About Running Out of Food in the Last Year: Patient declined    Ran Out of Food in the Last Year: Patient declined  Recent Concern:  Food Insecurity - Food Insecurity Present (09/23/2023)   Hunger Vital Sign    Worried About Running Out of Food in the Last Year: Often true    Ran Out of Food in the Last Year: Often true  Transportation Needs: Patient Declined (09/24/2023)   PRAPARE - Administrator, Civil Service (Medical): Patient declined    Lack of Transportation (Non-Medical): Patient declined  Physical Activity: Not on file  Stress: Not on file  Social Connections: Patient Declined (09/24/2023)   Social Connection and Isolation Panel    Frequency of Communication with Friends and Family: Patient declined    Frequency of Social Gatherings with Friends and Family: Patient declined    Attends Religious Services: Patient declined    Database administrator or Organizations: Patient declined    Attends Engineer, structural: Patient declined    Marital Status: Patient declined      Current Medications: Current Facility-Administered Medications  Medication Dose Route Frequency Provider Last Rate Last Admin   acetaminophen  (TYLENOL ) tablet 650 mg  650 mg Oral Q6H PRN Brent, Amanda C, NP   650 mg at 09/24/23 0813   alum & mag hydroxide-simeth (MAALOX/MYLANTA) 200-200-20 MG/5ML suspension 30 mL  30 mL Oral Q4H PRN Brent, Amanda C, NP       busPIRone  (BUSPAR ) tablet 10 mg  10 mg Oral TID Brent, Amanda C, NP   10 mg at 09/27/23 1654   hydrOXYzine  (ATARAX ) tablet 25 mg  25 mg Oral TID PRN Brent, Amanda C, NP   25 mg at 09/25/23 2033   magnesium  hydroxide (MILK OF MAGNESIA) suspension 30 mL  30 mL Oral Daily PRN Brent, Amanda C, NP       nicotine  (NICODERM CQ  - dosed in mg/24 hours) patch 21 mg  21 mg Transdermal Q0600 Brent, Amanda C, NP   21 mg at 09/27/23 0831   nicotine  polacrilex (NICORETTE ) gum 2 mg  2 mg Oral PRN Brent, Amanda C, NP   2 mg at 09/26/23 1701   OLANZapine  (ZYPREXA ) injection 10 mg  10 mg Intramuscular TID PRN Brent, Amanda C, NP       OLANZapine  (ZYPREXA ) injection 5 mg  5 mg  Intramuscular TID PRN Brent, Amanda C, NP       OLANZapine  (ZYPREXA ) tablet 10 mg  10 mg Oral BID Lundy Salisbury, MD   10 mg at 09/27/23 1610   OLANZapine  zydis (ZYPREXA ) disintegrating tablet 5 mg  5 mg Oral TID PRN Brent, Amanda C, NP        Lab Results: No results found for this or any previous visit (from the past 48 hours).  Blood Alcohol level:  Lab Results  Component Value Date   [facility] <15 09/23/2023   ETH <10 06/08/2023    Metabolic Disorder Labs: Lab Results  Component Value Date   HGBA1C 5.2 09/23/2023  MPG 102.54 09/23/2023   MPG 108.28 06/11/2023   No results found for: PROLACTIN Lab Results  Component Value Date   CHOL 124 09/25/2023   TRIG 89 09/25/2023   HDL 62 09/25/2023   CHOLHDL 2.0 09/25/2023   VLDL 18 09/25/2023   LDLCALC 44 09/25/2023   LDLCALC 71 06/11/2023    Physical Findings: AIMS:  ,  ,  ,  ,  ,  ,   CIWA:  CIWA-Ar Total: 3 COWS:     Musculoskeletal: Strength & Muscle Tone: within normal limits Gait & Station: normal Patient leans: N/A  Psychiatric Specialty Exam:  Presentation  General Appearance:  In bed, wearing an ankle monitor, moderate engagement.  No EPS.  Eye Contact: Moderate.  Speech: Spontaneous.  Spoken.  Mood and Affect  Mood: Worried and depressed.  Affect: Restricted and mood congruent.  Thought Process  Thought Processes: Linear and goal directed.  Descriptions of Associations:Intact  Orientation:Full (Time, Place and Person)  Thought Content: Focused on housing.  Future-oriented.  No current suicidal thoughts.  No homicidal thoughts.  No thoughts of violence.  No negative ruminative flooding.  No guilty ruminations.  No delusional theme.  No obsessions.  Hallucinations: No hallucination in any modality.  Sensorium  Memory: Good.  Judgment: Good.  Insight: Good  Executive Functions  Concentration: Good.  Attention Span: Good.  Recall: Good.  Fund of  Knowledge: Good.  Language: Good   Psychomotor Activity  Decreased psychomotor activity    Physical Exam: Physical Exam ROS Blood pressure 119/84, pulse 68, temperature 98.6 F (37 C), temperature source Oral, resp. rate 16, height 5' 11.26 (1.81 m), weight 84.4 kg, SpO2 100%. Body mass index is 25.75 kg/m.   Treatment Plan Summary: Patient with extensive history of substance use disorder and schizoaffective disorder.  Recently released from jail for parole violation.  He has been adherent with his psychotropic medications.  He is stable but worried about housing.  No current dangerousness.  We are evaluating his options in the community.  We will keep his medicine the same.  1.  Continue olanzapine  10 mg at bedtime. 2.  Continue buspirone  10 mg 3 times daily. 3.  Continue to encourage unit groups and therapeutic activities. 4.  Continue to monitor mood behavior and interaction with others. 5.  Social worker will facilitate discharge and aftercare planning.  Amelie Jury, MD 09/27/2023, 7:43 PM

## 2023-09-27 NOTE — Progress Notes (Signed)
 CSW spoke with Valiant Gaul from [facility] regarding additional resources for patient pertaining to housing and substance abuse treatment programs. Athena Bland reported working with the patient to secure safe housing but has been unable to properly secure housing at this time. He reported being willing to help patient search for housing options and/or substance treatment should patient be interested still. CSW shared work Agricultural engineer and phone number with Athena Bland should he come across a new resource.    Korah Hufstedler, LCSWA 09/27/2023 10:05am

## 2023-09-27 NOTE — Group Note (Addendum)
 Recreation Therapy Group Note   Group Topic:Stress Management  Group Date: 09/27/2023 Start Time: 5621 End Time: 1003 Facilitators: Tailor Lucking-McCall, LRT,CTRS Location: 300 Hall Dayroom   Group Topic: Stress Management   Goal Area(s) Addresses:  Patient will actively participate in stress management techniques presented during session.  Patient will successfully identify benefit of practicing stress management post d/c.   Behavioral Response:   Intervention: Relaxation exercise with ambient sound and script   Activity: Guided Imagery. LRT provided education, instruction, and demonstration on practice of visualization via guided imagery. Patient was asked to participate in the technique introduced during session. LRT debriefed including topics of mindfulness, stress management and specific scenarios each patient could use these techniques. Patients were given suggestions of ways to access scripts post d/c and encouraged to explore Youtube and other apps available on smartphones, tablets, and computers.  Education:  Stress Management, Discharge Planning.   Education Outcome: Acknowledges education   Affect/Mood: N/A   Participation Level: Did not attend    Clinical Observations/Individualized Feedback:     Plan: Continue to engage patient in RT group sessions 2-3x/week.   Mystic Labo-McCall, LRT,CTRS 09/27/2023 12:31 PM

## 2023-09-27 NOTE — Progress Notes (Signed)
 CSW spoke with patient regarding housing resources given. Patient reported he was able to call some of the places but could not get in contact with some places. CSW encouraged patient to continue to call around to check on housing/bed availability.   Ellanora Rayborn, LCSWA 09/27/2023 3:14PM

## 2023-09-27 NOTE — Progress Notes (Signed)
 CSW called patient's PO as patient reported he has not been able to get in contact with her regarding his stay here. CSW contacted Officer Spandel [phone] but did not get an answer. Left a voicemail asking the officer to return call at earliest convenience.    Brendalyn Vallely, LCSWA 09/27/2023 9:51AM

## 2023-09-27 NOTE — Group Note (Signed)
 Date:  09/27/2023 Time:  10:20 AM  Group Topic/Focus:  Emotional Education:   The focus of this group is to discuss what feelings/emotions are, and how they are experienced. Goals Group:   The focus of this group is to help patients establish daily goals to achieve during treatment and discuss how the patient can incorporate goal setting into their daily lives to aide in recovery. Orientation:   The focus of this group is to educate the patient on the purpose and policies of crisis stabilization and provide a format to answer questions about their admission.  The group details unit policies and expectations of patients while admitted.    Participation Level:  Active  Participation Quality:  Appropriate  Affect:  Appropriate  Cognitive:  Appropriate  Insight: Good  Engagement in Group:  Engaged  Modes of Intervention:  Discussion  Additional Comments:  Pt goal is to work on his discharge plan.   Cody Poole 09/27/2023, 10:20 AM

## 2023-09-28 DIAGNOSIS — F251 Schizoaffective disorder, depressive type: Secondary | ICD-10-CM | POA: Diagnosis not present

## 2023-09-28 NOTE — Progress Notes (Signed)
 [facility] MD Progress Note  09/28/2023 3:23 PM Cody Poole  MRN:  [id number] Subjective:   28 year old [name] male, single, unemployed, homeless.  Background history of substance use disorder and schizoaffective disorder.  Recently released from jail after 90 days.  Currently on parole.  Presented on account of worsening depression associated with suicidal thoughts.  Expressed thoughts of shooting himself with a gun. Routine labs are essentially normal.  No alcohol or any psychoactive substance on board.  Chart reviewed today.  Patient discussed at multidisciplinary team meeting.  Nursing staff reports that patient signed a 72-hour notice today.  He is not endorsing any suicidal thoughts.  He is not endorsing any violent thoughts.  No observed response to internal stimuli.  He participates minimally with the milieu.  He slept well last night.  He is eating his meals.  Social social worker reports that he will likely go to a shelter.  Seen today.  Patient tells me that he is ready to get discharged.  States that he is going to go to a faith-based addiction treatment program in [place].  States that he was able to initiate this program himself.  He will stay in the shelter in the meantime.  Patient denied any cravings for psychoactive substances.  He is future oriented.  No suicidal thoughts.  No homicidal thoughts.  No thoughts of violence.  There are no manic symptoms.  There are no psychotic symptoms.  No adverse effects from his medication. Encouraged to keep ventilating his feelings to staff.   Principal Problem: Schizoaffective disorder, depressive type (HCC) Diagnosis: Principal Problem:   Schizoaffective disorder, depressive type (HCC)  Total Time spent with patient: 20 minutes  Past Psychiatric History:  See H&P  Past Medical History:  Past Medical History:  Diagnosis Date   Anxiety    Back pain    Cervical spondylosis    Depression    ETOH abuse    Heroin abuse (HCC)     Hypertension    IV drug abuse (HCC)    Methamphetamine use (HCC)    Opioid abuse with withdrawal (HCC)    Paranoid schizophrenia (HCC)    Polysubstance abuse (HCC)    Subdural hematoma caused by concussion (HCC) 05/08/2022   Fall/Admit to [facility] for observation   Tobacco abuse    History reviewed. No pertinent surgical history. Family History:  Family History  Family history unknown: Yes   Family Psychiatric  History:  See H&P  Social History:  Social History   Substance and Sexual Activity  Alcohol Use Not Currently     Social History   Substance and Sexual Activity  Drug Use Yes   Types: IV, Cocaine, Amphetamines, Heroin, Marijuana, Methamphetamines    Social History   Socioeconomic History   Marital status: Single    Spouse name: Not on file   Number of children: Not on file   Years of education: Not on file   Highest education level: Not on file  Occupational History   Not on file  Tobacco Use   Smoking status: Every Day   Smokeless tobacco: Never  Vaping Use   Vaping status: Never Used  Substance and Sexual Activity   Alcohol use: Not Currently   Drug use: Yes    Types: IV, Cocaine, Amphetamines, Heroin, Marijuana, Methamphetamines   Sexual activity: Not on file    Comment: heroin, crack cocaine  Other Topics Concern   Not on file  Social History Narrative   Not on file  Social Drivers of Corporate investment banker Strain: Not on file  Food Insecurity: Patient Declined (09/24/2023)   Hunger Vital Sign    Worried About Running Out of Food in the Last Year: Patient declined    Ran Out of Food in the Last Year: Patient declined  Recent Concern: Food Insecurity - Food Insecurity Present (09/23/2023)   Hunger Vital Sign    Worried About Running Out of Food in the Last Year: Often true    Ran Out of Food in the Last Year: Often true  Transportation Needs: Patient Declined (09/24/2023)   PRAPARE - Administrator, Civil Service  (Medical): Patient declined    Lack of Transportation (Non-Medical): Patient declined  Physical Activity: Not on file  Stress: Not on file  Social Connections: Patient Declined (09/24/2023)   Social Connection and Isolation Panel    Frequency of Communication with Friends and Family: Patient declined    Frequency of Social Gatherings with Friends and Family: Patient declined    Attends Religious Services: Patient declined    Database administrator or Organizations: Patient declined    Attends Engineer, structural: Patient declined    Marital Status: Patient declined      Current Medications: Current Facility-Administered Medications  Medication Dose Route Frequency Provider Last Rate Last Admin   acetaminophen  (TYLENOL ) tablet 650 mg  650 mg Oral Q6H PRN Brent, Amanda C, NP   650 mg at 09/24/23 0813   alum & mag hydroxide-simeth (MAALOX/MYLANTA) 200-200-20 MG/5ML suspension 30 mL  30 mL Oral Q4H PRN Brent, Amanda C, NP       busPIRone  (BUSPAR ) tablet 10 mg  10 mg Oral TID Brent, Amanda C, NP   10 mg at 09/28/23 1300   hydrOXYzine  (ATARAX ) tablet 25 mg  25 mg Oral TID PRN Brent, Amanda C, NP   25 mg at 09/25/23 2033   magnesium  hydroxide (MILK OF MAGNESIA) suspension 30 mL  30 mL Oral Daily PRN Brent, Amanda C, NP       nicotine  (NICODERM CQ  - dosed in mg/24 hours) patch 21 mg  21 mg Transdermal Q0600 Nadara Auerbach C, NP   21 mg at 09/28/23 8657   nicotine  polacrilex (NICORETTE ) gum 2 mg  2 mg Oral PRN Brent, Amanda C, NP   2 mg at 09/26/23 1701   OLANZapine  (ZYPREXA ) injection 10 mg  10 mg Intramuscular TID PRN Brent, Amanda C, NP       OLANZapine  (ZYPREXA ) injection 5 mg  5 mg Intramuscular TID PRN Brent, Amanda C, NP       OLANZapine  (ZYPREXA ) tablet 10 mg  10 mg Oral BID Lundy Salisbury, MD   10 mg at 09/28/23 8469   OLANZapine  zydis (ZYPREXA ) disintegrating tablet 5 mg  5 mg Oral TID PRN Brent, Amanda C, NP        Lab Results: No results found for this or  any previous visit (from the past 48 hours).  Blood Alcohol level:  Lab Results  Component Value Date   [facility] <15 09/23/2023   ETH <10 06/08/2023    Metabolic Disorder Labs: Lab Results  Component Value Date   HGBA1C 5.2 09/23/2023   MPG 102.54 09/23/2023   MPG 108.28 06/11/2023   No results found for: PROLACTIN Lab Results  Component Value Date   CHOL 124 09/25/2023   TRIG 89 09/25/2023   HDL 62 09/25/2023   CHOLHDL 2.0 09/25/2023   VLDL 18 09/25/2023  LDLCALC 44 09/25/2023   LDLCALC 71 06/11/2023    Physical Findings: AIMS:  ,  ,  ,  ,  ,  ,   CIWA:  CIWA-Ar Total: 3 COWS:     Musculoskeletal: Strength & Muscle Tone: within normal limits Gait & Station: normal Patient leans: N/A  Psychiatric Specialty Exam:  Presentation  General Appearance:  Open about on the unit, wearing an ankle monitor, good rapport.  No EPS.  Eye Contact: Good.  Speech: Spontaneous.  Normal rate, tone and volume  Mood and Affect  Mood: Subjectively and objectively better.  Affect: Restricted and mood congruent.  Thought Process  Thought Processes: Linear and goal directed.  Descriptions of Associations:Intact  Orientation:Full (Time, Place and Person)  Thought Content: Future-oriented.  No current suicidal thoughts.  No homicidal thoughts.  No thoughts of violence.  No negative ruminative flooding.  No guilty ruminations.  No delusional theme.  No obsessions.  Hallucinations: No hallucination in any modality.  Sensorium  Memory: Good.  Judgment: Good.  Insight: Good  Executive Functions  Concentration: Good.  Attention Span: Good.  Recall: Good.  Fund of Knowledge: Good.  Language: Good   Psychomotor Activity  Decreased psychomotor activity    Physical Exam: Physical Exam ROS Blood pressure 136/86, pulse 70, temperature 98 F (36.7 C), temperature source Oral, resp. rate 16, height 5' 11.26 (1.81 m), weight 84.4 kg, SpO2 100%. Body  mass index is 25.75 kg/m.   Treatment Plan Summary: Patient signed a 72-hour notice.  He is not exhibiting any symptoms of mania.  No psychotic symptoms.  No overwhelming anxiety.  He is not pervasively down.  He seems resourceful and future oriented.  No dangerousness.  We will evaluate him overnight.  Hopeful discharge tomorrow if he maintains stability.  1.  Olanzapine  10 mg at bedtime. 2.  Buspirone  10 mg 3 times daily. 3.  Continue to encourage unit groups and therapeutic activities. 4.  Continue to monitor mood behavior and interaction with others. 5.  Social worker will facilitate discharge and aftercare planning.  Amelie Jury, MD 09/28/2023, 3:23 PM

## 2023-09-28 NOTE — Progress Notes (Signed)
 Patient ID: Cody Poole, male   DOB: [birth year], 28 y.o.   MRN: [id number] Received message from [facility] stating that Pt would need to reach out to the Department of Social Services and make them aware that he is no longer incarcerated.   CSW spoke with Pt on the unit. He remains concerned about living arrangements post discharge. Pt has been reminded that placement post discharge is not guranteed and that CSW team will provide additional resources as appropriate.   CSW provided Pt with additional [employer] for [place], [place]  [place].    CSW team will continue to follow for care coordination and discharge planning.     Sharronda Schweers N Evadne Ose, LCSW 09/28/23 2:03 PM

## 2023-09-28 NOTE — [facility] Group Notes (Signed)
 [facility] Group Notes:  (Nursing/MHT/Case Management/Adjunct)  Date:  09/28/2023  Time:  9:24 PM  Type of Therapy:  Psychoeducational Skills  Participation Level:  Did Not Attend  Participation Quality:  Resistant  Affect:  Resistant  Cognitive:  Lacking  Insight:  None  Engagement in Group:  None  Modes of Intervention:  Education  Summary of Progress/Problems: The patient did not attend group this evening.   Swade Shonka S 09/28/2023, 9:24 PM

## 2023-09-28 NOTE — Plan of Care (Signed)
  Problem: Activity: Goal: Sleeping patterns will improve Outcome: Progressing   Problem: Coping: Goal: Ability to demonstrate self-control will improve Outcome: Progressing   Problem: Safety: Goal: Periods of time without injury will increase Outcome: Progressing   

## 2023-09-28 NOTE — Group Note (Signed)
 LCSW Group Therapy Note   Group Date: 09/28/2023 Start Time: 1100 End Time: 1200  Participation:  did not attend  Type of Therapy:  Group Therapy  Topic:  Understanding Your Path to Change  Objective:  The goal is to help individuals understand the stages of change, identify where they currently are in the process, and provide actionable next steps to continue moving forward in their journey of change.  Goals: Learn about the six stages of change:  Precontemplation, Contemplation, Preparation, Action, Maintenance, and Relapse Reflect on Current Change Efforts:  Recognize which stage participants are in regarding a personal change. Plan Next Steps for Moving Forward:  Create an action plan based on their current stage of change.  Class Summary:  In this session, we explored the Stages of Change as a framework to understand the process of change.  We discussed how each stage helps individuals recognize where they are in their personal journey and used the Stages of Change Worksheet for self-reflection. Participants answered questions to better understand their current stage, challenges, and progress. We also emphasized the importance of moving forward, even if setbacks (Relapse) occur, and created actionable steps to help participants continue progressing. By the end of the session, participants gained a clearer understanding of their path to change and left with a clear plan for next steps.  Therapeutic Modalities:  Elements of CBT (cognitive restructuring, problem solving)  Element of DBT (mindfulness, distress tolerance)   Ajooni Karam O Melah Ebling, LCSWA 09/28/2023  12:09 PM

## 2023-09-28 NOTE — Group Note (Signed)
 Date:  09/28/2023 Time:  10:48 AM  Group Topic/Focus:  Coping With Mental Health Crisis:   The purpose of this group is to help patients identify strategies for coping with mental health crisis.  Group discusses possible causes of crisis and ways to manage them effectively. Goals Group:   The focus of this group is to help patients establish daily goals to achieve during treatment and discuss how the patient can incorporate goal setting into their daily lives to aide in recovery. Orientation:   The focus of this group is to educate the patient on the purpose and policies of crisis stabilization and provide a format to answer questions about their admission.  The group details unit policies and expectations of patients while admitted.    Participation Level:  Did Not Attend   Cody Poole 09/28/2023, 10:48 AM

## 2023-09-28 NOTE — Group Note (Signed)
 Recreation Therapy Group Note   Group Topic:Animal Assisted Therapy   Group Date: 09/28/2023 Start Time: 2956 End Time: 1035 Facilitators: Kevyn Wengert-McCall, LRT,CTRS Location: 300 Hall Dayroom   Animal-Assisted Activity (AAA) Program Checklist/Progress Notes Patient Eligibility Criteria Checklist & Daily Group note for Rec Tx Intervention  AAA/T Program Assumption of Risk Form signed by Patient/ or Parent Legal Guardian Yes  Patient is free of allergies or severe asthma Yes  Patient reports no fear of animals Yes  Patient reports no history of cruelty to animals Yes  Patient understands his/her participation is voluntary Yes  Patient washes hands before animal contact Yes  Patient washes hands after animal contact Yes  Behavioral Response: Appropriate   Education: Hand Washing, Appropriate Animal Interaction   Education Outcome: Acknowledges education.    Affect/Mood: Appropriate   Participation Level: Engaged   Participation Quality: Independent   Behavior: Appropriate   Speech/Thought Process: Focused   Insight: Good   Judgement: Good   Modes of Intervention: Teaching laboratory technician   Patient Response to Interventions:  Engaged   Education Outcome:  In group clarification offered    Clinical Observations/Individualized Feedback: Patient attended session and interacted appropriately with therapy dog and peers. Patient asked appropriate questions about therapy dog and his training. Patient shared stories about their pets at home with group.   Plan: Continue to engage patient in RT group sessions 2-3x/week.   Gordon Vandunk-McCall, LRT,CTRS 09/28/2023 12:25 PM

## 2023-09-29 DIAGNOSIS — F251 Schizoaffective disorder, depressive type: Secondary | ICD-10-CM | POA: Diagnosis not present

## 2023-09-29 MED ORDER — OLANZAPINE 10 MG PO TABS: 10.0000 mg | ORAL_TABLET | Freq: Two times a day (BID) | ORAL | 0 refills | Status: AC

## 2023-09-29 MED ORDER — BUSPIRONE HCL 10 MG PO TABS: 10.0000 mg | ORAL_TABLET | Freq: Three times a day (TID) | ORAL | 0 refills | Status: AC

## 2023-09-29 MED ORDER — NICOTINE 21 MG/24HR TD PT24: 21.0000 mg | MEDICATED_PATCH | Freq: Every day | TRANSDERMAL | 0 refills | Status: AC

## 2023-09-29 NOTE — Plan of Care (Signed)
  Problem: Education: Goal: Knowledge of [place] [facility] Education information/materials will improve Outcome: Completed/Met Goal: Emotional status will improve Outcome: Adequate for Discharge Goal: Verbalization of understanding the information provided will improve Outcome: Adequate for Discharge   Problem: Activity: Goal: Interest or engagement in activities will improve Outcome: Progressing

## 2023-09-29 NOTE — Progress Notes (Signed)
   09/29/23 0740  Psych Admission Type (Psych Patients Only)  Admission Status Voluntary/72 hour document signed  Psychosocial Assessment  Patient Complaints None  Eye Contact Brief  Facial Expression Animated  Affect Appropriate to circumstance  Speech Logical/coherent  Interaction Assertive  Motor Activity Other (Comment) (WNL)  Appearance/Hygiene Unremarkable  Behavior Characteristics Cooperative;Appropriate to situation  Mood Anxious  Thought Process  Coherency WDL  Content WDL  Delusions None reported or observed  Perception WDL  Hallucination None reported or observed  Judgment Poor  Confusion None  Danger to Self  Current suicidal ideation? Denies  Agreement Not to Harm Self Yes  Description of Agreement verbal  Danger to Others  Danger to Others None reported or observed

## 2023-09-29 NOTE — Progress Notes (Signed)
   09/28/23 2110  Psych Admission Type (Psych Patients Only)  Admission Status Voluntary/72 hour document signed  Psychosocial Assessment  Patient Complaints Irritability;Isolation  Eye Contact Avoids  Facial Expression Other (Comment) (Unable to assess.)  Affect Irritable;Labile  Speech UTA  Interaction No initiation;Isolative  Motor Activity Other (Comment) (Unable to assess.)  Appearance/Hygiene UTA  Behavior Characteristics Agitated;Guarded  Thought Process  Coherency WDL  Content WDL  Delusions None reported or observed  Perception WDL  Hallucination None reported or observed  Judgment Poor  Confusion None  Danger to Self  Current suicidal ideation?  (Denies)  Agreement Not to Harm Self Yes  Description of Agreement Notify Staff.

## 2023-09-29 NOTE — Plan of Care (Signed)
  Problem: Education: Goal: Knowledge of [place] [facility] Education information/materials will improve Outcome: Completed/Met Goal: Emotional status will improve 09/29/2023 1102 by Media Spikes, RN Outcome: Completed/Met 09/29/2023 0823 by Media Spikes, RN Outcome: Adequate for Discharge Goal: Mental status will improve Outcome: Completed/Met Goal: Verbalization of understanding the information provided will improve 09/29/2023 1102 by Media Spikes, RN Outcome: Completed/Met 09/29/2023 0823 by Media Spikes, RN Outcome: Adequate for Discharge   Problem: Activity: Goal: Interest or engagement in activities will improve 09/29/2023 1102 by Media Spikes, RN Outcome: Completed/Met 09/29/2023 0823 by Media Spikes, RN Outcome: Progressing Goal: Sleeping patterns will improve Outcome: Completed/Met   Problem: Coping: Goal: Ability to verbalize frustrations and anger appropriately will improve Outcome: Completed/Met Goal: Ability to demonstrate self-control will improve Outcome: Completed/Met

## 2023-09-29 NOTE — [facility] Suicide Risk Assessment (Signed)
 [facility] Discharge Suicide Risk Assessment   Principal Problem: Schizoaffective disorder, depressive type [facility] Discharge Diagnoses: Principal Problem:   Schizoaffective disorder, depressive type (HCC)   Total Time spent with patient: 30 minutes  Musculoskeletal: Strength & Muscle Tone: within normal limits Gait & Station: normal Patient leans: N/A  Psychiatric Specialty Exam  Presentation  General Appearance:  Casually dressed, not in any distress, appropriate behavior, engaged politely.  No EPS.  Eye Contact: Good.  Speech: Spontaneous.  Normal rate, tone and volume.  Normal prosody of speech.  Mood and Affect  Mood: Euthymic.  Affect: Full range and appropriate.  Thought Process  Thought Processes: Linear and goal directed.  Descriptions of Associations:Intact  Orientation:Full (Time, Place and Person)  Thought Content: Future oriented.  No current suicidal thoughts.  No homicidal thoughts.  No thoughts of violence.  No negative ruminative flooding.  No guilty ruminations.  No delusional theme.  No obsessions.  Hallucinations: No hallucination in any modality.  Sensorium  Memory: Good.  Judgment: Good.  Insight: Good  Executive Functions  Concentration: Good.  Attention Span: Good.  Recall: Good.  Fund of Knowledge: Good.  Language: Good   Psychomotor Activity  Normal psychomotor activity   Physical Exam: Physical Exam ROS Blood pressure 121/84, pulse 85, temperature 98.8 F (37.1 C), temperature source Oral, resp. rate 16, height 5' 11.26 (1.81 m), weight 84.4 kg, SpO2 99%. Body mass index is 25.75 kg/m.  Mental Status Per Nursing Assessment::   On Admission:  NA  Demographic Factors:  Male, Adolescent or young adult, [name], and Unemployed  Loss Factors: Legal issues and Financial problems/change in socioeconomic status  Historical Factors: Prior suicide attempts, Family history of mental illness or  substance abuse, and Impulsivity  Risk Reduction Factors:   Sense of responsibility to family and Positive coping skills or problem solving skills  Continued Clinical Symptoms:  Patient has responded well to medication adjustment.  He does not have any residual mood symptoms.  No psychotic symptoms.  No overwhelming anxiety.  Cognitive Features That Contribute To Risk:  None    Suicide Risk:  Minimal: No current suicidal thoughts.  No current homicidal thoughts.  No current thoughts of violence.  Modifiable risk factor targeted during this admission as mania and psychosis.  Patient has responded well to medication adjustment.  No new psychosocial stressor.  He has some support from his mother.  No current cravings for psychoactive substances.  Patient is aware of risk of accidental overdose on previously tolerated opiates. At this point in time, patient is not a danger to self or others.  Patient is stable for care at the lower setting.  Follow-up Information     [facility] Follow up on 10/01/2023.   Why: You have a hospital follow up appointment for therapy and medication management services on 10/01/23 at 2:00 pm.  The appointment will be Virtual telehealth. Contact information: [address]  [address] [place] [address] [phone]                 Plan Of Care/Follow-up recommendations:  See discharge summary  Amelie Jury, MD 09/29/2023, 9:32 AM

## 2023-09-29 NOTE — [facility] Suicide Risk Assessment (Signed)
 [facility] INPATIENT:  Patient Suicide Prevention Education  Suicide Prevention Education:  Education Completed;   Suicide Prevention Education was reviewed thoroughly with patient, including risk factors, warning signs, and what to do. Mobile Crisis services were described and that telephone number pointed out, with encouragement to patient to put this number in personal cell phone. Brochure was provided to patient to share with natural supports. Patient acknowledged the ways in which they are at risk, and how working through each of their issues can gradually start to reduce their risk factors. Patient was encouraged to think of the information in the context of people in their own lives. Patient denied having access to firearms Patient verbalized understanding of information provided. Patient endorsed a desire to live.    The suicide prevention education provided includes the following: Suicide risk factors Suicide prevention and interventions National Suicide Hotline telephone number [facility] assessment telephone number [facility] Emergency Assistance 911 [facility] and/or Residential Mobile Crisis Unit telephone number   Richardson Chang [place], [place] 09/29/23 10:01 AM

## 2023-09-29 NOTE — Progress Notes (Signed)
 [facility] Adult Case Management Discharge Plan :  Will you be returning to the same living situation after discharge:  Yes,  pt homeless. Will be going to [employer] at discharge At discharge, do you have transportation home?: Yes,  CSW arranged via [employer] Do you have the ability to pay for your medications: No. Pt provided with medication assistance via [facility] pharmacy  Release of information consent forms completed and in the chart;  Patient's signature needed at discharge.  Patient to Follow up at:  Follow-up Information     [facility] Follow up on 10/01/2023.   Why: You have a hospital follow up appointment for therapy and medication management services on 10/01/23 at 2:00 pm.  The appointment will be Virtual telehealth. Contact information: [address]  [address] [place] [address] [phone]                 Next level of care provider has access to [facility] Link:no  Safety Planning and Suicide Prevention discussed: Yes,  completed with Pt prior to discharge   Has patient been referred to the Quitline?: Patient refused referral for treatment  Patient has been referred for addiction treatment: Yes, the patient will follow up with an outpatient provider for substance use disorder. Psychiatrist/APP: appointment made and Therapist: appointment made with Javan Messing, LCSW 09/29/2023, 10:41 AM

## 2023-09-29 NOTE — Group Note (Signed)
 Date:  09/29/2023 Time:  9:15 AM  Group Topic/Focus:  Goals Group:   The focus of this group is to help patients establish daily goals to achieve during treatment and discuss how the patient can incorporate goal setting into their daily lives to aide in recovery.    Participation Level:  Active  Participation Quality:  Appropriate and Attentive  Affect:  Appropriate  Cognitive:  Alert and Appropriate  Insight: Appropriate and Good  Engagement in Group:  Engaged  Modes of Intervention:  Discussion  Additional Comments:  Pt goal is to find stable housing, stay positive and to stay off hard drugs.  Tita Form 09/29/2023, 9:15 AM

## 2023-09-29 NOTE — Plan of Care (Signed)
  Problem: Education: Goal: Emotional status will improve Outcome: Progressing Goal: Mental status will improve Outcome: Progressing Goal: Verbalization of understanding the information provided will improve Outcome: Progressing   Problem: Activity: Goal: Interest or engagement in activities will improve Outcome: Progressing   Problem: Coping: Goal: Ability to verbalize frustrations and anger appropriately will improve Outcome: Progressing   Problem: Health Behavior/Discharge Planning: Goal: Identification of resources available to assist in meeting health care needs will improve Outcome: Progressing   Problem: Education: Goal: Knowledge of [facility] Education information/materials will improve Outcome: Progressing Goal: Emotional status will improve Outcome: Progressing   Problem: Coping: Goal: Ability to verbalize frustrations and anger appropriately will improve Outcome: Progressing   Problem: Safety: Goal: Periods of time without injury will increase Outcome: Progressing   Problem: Coping: Goal: Coping ability will improve Outcome: Progressing

## 2023-09-29 NOTE — Discharge Summary (Signed)
 Physician Discharge Summary Note  Patient:  Cody Poole is an 28 y.o., male MRN:  [id number] DOB:  05/27/1995 Patient phone:  [phone] (home)  Patient address:   [address] [place] [address],  Total Time spent with patient: 45 minutes  Date of Admission:  09/24/2023 Date of Discharge: 09/29/2023  Reason for Admission:   28 year old [name] male, single, unemployed, homeless.  Background history of substance use disorder and schizoaffective disorder.  Recently released from jail after 90 days.  Currently on parole.  Presented on account of worsening depression associated with suicidal thoughts.  Expressed thoughts of shooting himself with a gun. Routine labs are essentially normal.  No alcohol or any psychoactive substance on board.  Principal Problem: Schizoaffective disorder, depressive type [facility]) Discharge Diagnoses: Principal Problem:   Schizoaffective disorder, depressive type (HCC)   Past Psychiatric History:  Previous Psych Diagnoses: anxiety, depression, polysubstance abuse, paranoid schizophrenia Prior inpatient treatment: several times Current/prior outpatient treatment: no Prior rehab hx: yes Psychotherapy hx: yes History of suicide: 2 years ago, overdose History of homicide or aggression: denies Psychiatric medication history: zyprexa , buspar , remeron, suboxone  Psychiatric medication compliance history: Neuromodulation history:  Current Psychiatrist: Current therapist:    Substance Abuse Hx: Alcohol: no Tobacco: yes patch ordered Illicit drugs: Cocaine: no Marijuana:yes, most recently yesterday before leaving prison Heroin: yes, frequently, 92 days ago, prior to his 90 day jail stay Opioids: yes, frequently. Methamphetamines: Yes, frequently prior to jail. Hallucinogens: 2 years ago, magic mushroom candy bar Rehab hx: wishes to have rehab  Past Medical History:  Past Medical History:  Diagnosis Date   Anxiety    Back pain    Cervical  spondylosis    Depression    ETOH abuse    Heroin abuse (HCC)    Hypertension    IV drug abuse (HCC)    Methamphetamine use (HCC)    Opioid abuse with withdrawal (HCC)    Paranoid schizophrenia (HCC)    Polysubstance abuse (HCC)    Subdural hematoma caused by concussion (HCC) 05/08/2022   Fall/Admit to [facility] for observation   Tobacco abuse    History reviewed. No pertinent surgical history. Family History:  Family History  Family history unknown: Yes   Family Psychiatric  History:  Social History:  Social History   Substance and Sexual Activity  Alcohol Use Not Currently     Social History   Substance and Sexual Activity  Drug Use Yes   Types: IV, Cocaine, Amphetamines, Heroin, Marijuana, Methamphetamines    Social History   Socioeconomic History   Marital status: Single    Spouse name: Not on file   Number of children: Not on file   Years of education: Not on file   Highest education level: Not on file  Occupational History   Not on file  Tobacco Use   Smoking status: Every Day   Smokeless tobacco: Never  Vaping Use   Vaping status: Never Used  Substance and Sexual Activity   Alcohol use: Not Currently   Drug use: Yes    Types: IV, Cocaine, Amphetamines, Heroin, Marijuana, Methamphetamines   Sexual activity: Not on file    Comment: heroin, crack cocaine  Other Topics Concern   Not on file  Social History Narrative   Not on file   Social Drivers of Health   Financial Resource Strain: Not on file  Food Insecurity: Patient Declined (09/24/2023)   Hunger Vital Sign    Worried About Running Out of Food in the Last  Year: Patient declined    Barista in the Last Year: Patient declined  Recent Concern: Food Insecurity - Food Insecurity Present (09/23/2023)   Hunger Vital Sign    Worried About Running Out of Food in the Last Year: Often true    Ran Out of Food in the Last Year: Often true  Transportation Needs: Patient Declined (09/24/2023)    PRAPARE - Administrator, Civil Service (Medical): Patient declined    Lack of Transportation (Non-Medical): Patient declined  Physical Activity: Not on file  Stress: Not on file  Social Connections: Patient Declined (09/24/2023)   Social Connection and Isolation Panel    Frequency of Communication with Friends and Family: Patient declined    Frequency of Social Gatherings with Friends and Family: Patient declined    Attends Religious Services: Patient declined    Database administrator or Organizations: Patient declined    Attends Banker Meetings: Patient declined    Marital Status: Patient declined    Hospital Course:   Patient was admitted on suicide precautions.  Olanzapine  was introduced to target mood symptoms.  He tolerated the medication well and responded well to treatment.  Patient was mostly isolative in his room.  He had minimal participation with unit groups and therapeutic activities.  He did not exhibit any violent behavior on the unit.  No self-injurious behavior on the unit.  Sleep-wake cycle was well-regulated.  Other biological functions were well regulated.  He was initially interested in residential facility for addiction but changed his mind along the line.  He signed a 72-hour notice as he felt like he is back to his normal self.  Seen today.  In good spirits.  States that his mother is supportive.  States that his mother has paid for a hotel accommodation for him.  He plans to go to [facility] stop at [facility] so that he can restart Suboxone .  Patient is not endorsing any delusions.  No abnormal perception.  He feels in control of his thoughts and his actions.  There are no manic symptoms.  No evidence of depression.  No overwhelming anxiety.  Patient is future oriented.  No rageful thoughts towards self or towards others.  Nursing staff reports that he slept well last night.  He has been appropriate on the unit.  No challenging behavior.  No major  issues.  Patient and team agrees that he is back to his baseline.  Team agrees with discharge today.  Physical Findings: AIMS:  , ,  ,  ,  ,  ,   CIWA:  CIWA-Ar Total: 3 COWS:     Musculoskeletal: Strength & Muscle Tone: within normal limits Gait & Station: normal Patient leans: N/A   Psychiatric Specialty Exam:  Presentation  General Appearance:  Casually dressed, not in any distress, appropriate behavior, engaged politely.  No EPS.  Eye Contact: Good.  Speech: Spontaneous.  Normal rate, tone and volume.  Normal prosody of speech.  Mood and Affect  Mood: Euthymic.  Affect: Full range and appropriate.  Thought Process  Thought Processes: Linear and goal directed.  Descriptions of Associations:Intact  Orientation:Full (Time, Place and Person)  Thought Content: Future oriented.  No current suicidal thoughts.  No homicidal thoughts.  No thoughts of violence.  No negative ruminative flooding.  No guilty ruminations.  No delusional theme.  No obsessions.  Hallucinations: No hallucination in any modality.  Sensorium  Memory: Good.  Judgment: Good.  Insight: Good  Executive Functions  Concentration: Good.  Attention Span: Good.  Recall: Good.  Fund of Knowledge: Good.  Language: Good   Psychomotor Activity  Normal psychomotor activity    Physical Exam: Physical Exam ROS Blood pressure 121/84, pulse 85, temperature 98.8 F (37.1 C), temperature source Oral, resp. rate 16, height 5' 11.26 (1.81 m), weight 84.4 kg, SpO2 99%. Body mass index is 25.75 kg/m.   Social History   Tobacco Use  Smoking Status Every Day  Smokeless Tobacco Never   Tobacco Cessation:  A prescription for an FDA-approved tobacco cessation medication provided at discharge   Blood Alcohol level:  Lab Results  Component Value Date   [facility] <15 09/23/2023   ETH <10 06/08/2023    Metabolic Disorder Labs:  Lab Results  Component Value Date   HGBA1C 5.2  09/23/2023   MPG 102.54 09/23/2023   MPG 108.28 06/11/2023   No results found for: PROLACTIN Lab Results  Component Value Date   CHOL 124 09/25/2023   TRIG 89 09/25/2023   HDL 62 09/25/2023   CHOLHDL 2.0 09/25/2023   VLDL 18 09/25/2023   LDLCALC 44 09/25/2023   LDLCALC 71 06/11/2023    See Psychiatric Specialty Exam and Suicide Risk Assessment completed by Attending Physician prior to discharge.  Discharge destination:  Home  Is patient on multiple antipsychotic therapies at discharge:  No   Has Patient had three or more failed trials of antipsychotic monotherapy by history:  No  Recommended Plan for Multiple Antipsychotic Therapies: NA  Discharge Instructions     Diet - low sodium heart healthy   Complete by: As directed    Increase activity slowly   Complete by: As directed       Allergies as of 09/29/2023       Reactions   Haldol  [haloperidol ] Swelling   Tongue swelling  Dysarthria  Relieved with benadryl          Medication List     STOP taking these medications    buprenorphine -naloxone  8-2 mg Subl SL tablet Commonly known as: SUBOXONE    mirtazapine 30 MG tablet Commonly known as: REMERON       TAKE these medications      Indication  busPIRone  10 MG tablet Commonly known as: BUSPAR  Take 1 tablet (10 mg total) by mouth 3 (three) times daily. What changed:  how much to take when to take this  Indication: Anxiety Disorder   nicotine  21 mg/24hr patch Commonly known as: NICODERM CQ  - dosed in mg/24 hours Place 1 patch (21 mg total) onto the skin daily at 6 (six) AM. Start taking on: September 30, 2023  Indication: Nicotine  Addiction   OLANZapine  10 MG tablet Commonly known as: ZYPREXA  Take 1 tablet (10 mg total) by mouth 2 (two) times daily. What changed:  medication strength how much to take when to take this  Indication: Schizophrenia        Follow-up Information     [facility] Follow up on 10/01/2023.   Why: You have a hospital  follow up appointment for therapy and medication management services on 10/01/23 at 2:00 pm.  The appointment will be Virtual telehealth. Contact information: [address]  [address] [place] [address] [phone]                 Follow-up recommendations:    Patient will stay on medication as recommended.  Patient will follow up as recommended.  No restrictions with respect to diet or activity.  Signed: Johari Bennetts A Alicianna Litchford,  MD 09/29/2023, 9:35 AM

## 2023-09-29 NOTE — Progress Notes (Signed)
 Patient ID: Cody Poole, male   DOB: 26-May-1995, 28 y.o.   MRN: [id number] Discharge instructions, medications and follow up appointments reviewed, patient verbalized understanding. Medications and belongings returned to patient, pt discharged via taxi.

## 2023-09-29 NOTE — Transportation (Signed)
 09/29/2023  Cody Poole DOB: 04-22-1995 MRN: [id number]   RIDER WAIVER AND RELEASE OF LIABILITY  For the purposes of helping with transportation needs, [facility] partners with outside transportation providers (taxi companies, [place], Catering manager.) to give [facility] patients or other approved people the choice of on-demand rides Public librarian) to our buildings for non-emergency visits.  By using [employer], I, the person signing this document, on behalf of myself and/or any legal minors (in my care using the [employer]), agree:  Science writer given to me are supplied by independent, outside transportation providers who do not work for, or have any affiliation with, [employer]. [place] is not a transportation company. [facility] has no control over the quality or safety of the rides I get using [employer]. [facility] has no control over whether any outside ride will happen on time or not. [facility] gives no guarantee on the reliability, quality, safety, or availability on any rides, or that no mistakes will happen. I know and accept that traveling by vehicle (car, truck, SVU, [place], bus, taxi, etc.) has risks of serious injuries such as disability, being paralyzed, and death. I know and agree the risk of using [employer] is mine alone, and not [employer]. [employer] are provided as is and as are available. The transportation providers are in charge for all inspections and care of the vehicles used to provide these rides. I agree not to take legal action against [facility], its agents, employees, officers, directors, representatives, insurers, attorneys, assigns, successors, subsidiaries, and affiliates at any time for any reasons related directly or indirectly to using [employer]. I also agree not to take legal action against [facility] or its affiliates for any injury, death, or damage to property caused by or related to using  [employer]. I have read this Waiver and Release of Liability, and I understand the terms used in it and their legal meaning. This Waiver is freely and voluntarily given with the understanding that my right (or any legal minors) to legal action against [place] relating to [employer] is knowingly given up to use these services.   I attest that I read the Ride Waiver and Release of Liability to Cody Poole, gave Mr. Kamaka the opportunity to ask questions and answered the questions asked (if any). I affirm that Cody Poole then provided consent for assistance with transportation.

## 2023-10-03 ENCOUNTER — Encounter (HOSPITAL_COMMUNITY): Payer: Self-pay

## 2023-10-03 ENCOUNTER — Other Ambulatory Visit: Payer: Self-pay

## 2023-10-03 ENCOUNTER — Emergency Department (HOSPITAL_COMMUNITY)
Admission: EM | Admit: 2023-10-03 | Discharge: 2023-10-03 | Disposition: A | Discharge status: Home / Self Care | Attending: Emergency Medicine | Admitting: Emergency Medicine

## 2023-10-03 DIAGNOSIS — F1110 Opioid abuse, uncomplicated: Secondary | ICD-10-CM | POA: Diagnosis present

## 2023-10-03 DIAGNOSIS — I10 Essential (primary) hypertension: Secondary | ICD-10-CM | POA: Diagnosis not present

## 2023-10-03 DIAGNOSIS — T401X1A Poisoning by heroin, accidental (unintentional), initial encounter: Secondary | ICD-10-CM | POA: Insufficient documentation

## 2023-10-03 LAB — I-STAT CHEM 8, ED
BUN: 19 mg/dL (ref 6–20)
Calcium, Ion: 1.21 mmol/L (ref 1.15–1.40)
Chloride: 97 mmol/L — ABNORMAL LOW (ref 98–111)
Creatinine, Ser: 1 mg/dL (ref 0.61–1.24)
Glucose, Bld: 128 mg/dL — ABNORMAL HIGH (ref 70–99)
HCT: 40 % (ref 39.0–52.0)
Hemoglobin: 13.6 g/dL (ref 13.0–17.0)
Potassium: 4.1 mmol/L (ref 3.5–5.1)
Sodium: 133 mmol/L — ABNORMAL LOW (ref 135–145)
TCO2: 27 mmol/L (ref 22–32)

## 2023-10-03 MED ORDER — NALOXONE HCL 4 MG/0.1ML NA LIQD
NASAL | 1 refills | Status: AC
  Filled 2023-10-03: qty 2, 30d supply, fill #0

## 2023-10-03 NOTE — ED Provider Notes (Signed)
 [facility] EMERGENCY DEPARTMENT AT [facility] Provider Note   CSN: [id number] Arrival date & time: 10/03/23  1425     Patient presents with: Drug Overdose   Cody Poole is a 28 y.o. male.    Drug Overdose   Patient has history of back pain hypertension anxiety depression polysubstance use paranoid schizophrenia.  Patient was found lying on the side of the road.  Patient reports that he used heroin earlier today.  Cannot say exactly what time he is.  Patient denies any complaints.  Suicidal homicidal ideation.    Prior to Admission medications   Medication Sig Start Date End Date Taking? Authorizing Provider  naloxone  (NARCAN ) nasal spray 4 mg/0.1 mL Use for accidental overdose of opiates such as heroin 10/03/23  Yes Randol Simmonds, MD  busPIRone  (BUSPAR ) 10 MG tablet Take 1 tablet (10 mg total) by mouth 3 (three) times daily. 09/29/23   Izediuno, Jerrell LABOR, MD  nicotine  (NICODERM CQ  - DOSED IN MG/24 HOURS) 21 mg/24hr patch Place 1 patch (21 mg total) onto the skin daily at 6 (six) AM. 09/30/23   Izediuno, Jerrell LABOR, MD  OLANZapine  (ZYPREXA ) 10 MG tablet Take 1 tablet (10 mg total) by mouth 2 (two) times daily. 09/29/23   Hinda Jerrell LABOR, MD    Allergies: Haldol  [haloperidol ]    Review of Systems  Updated Vital Signs BP 133/86   Pulse 99   Temp 98.8 F (37.1 C) (Oral)   Resp 15   SpO2 99%   Physical Exam Vitals and nursing note reviewed.  Constitutional:      General: He is not in acute distress.    Appearance: He is well-developed. He is not diaphoretic.  HENT:     Head: Normocephalic and atraumatic.     Right Ear: External ear normal.     Left Ear: External ear normal.   Eyes:     General: No scleral icterus.       Right eye: No discharge.        Left eye: No discharge.     Conjunctiva/sclera: Conjunctivae normal.   Neck:     Trachea: No tracheal deviation.   Cardiovascular:     Rate and Rhythm: Normal rate.  Pulmonary:     Effort: Pulmonary  effort is normal. No respiratory distress.     Breath sounds: No stridor.  Abdominal:     General: There is no distension.   Musculoskeletal:        General: No swelling or deformity.     Cervical back: Neck supple.   Skin:    General: Skin is warm and dry.     Findings: No rash.   Neurological:     Mental Status: He is easily aroused. Mental status is at baseline. He is lethargic.     Cranial Nerves: No dysarthria or facial asymmetry.     Motor: No seizure activity.     (all labs ordered are listed, but only abnormal results are displayed) Labs Reviewed  I-STAT CHEM 8, ED - Abnormal; Notable for the following components:      Result Value   Sodium 133 (*)    Chloride 97 (*)    Glucose, Bld 128 (*)    All other components within normal limits    EKG: None  Radiology: No results found.   Procedures   Medications Ordered in the ED - No data to display  Clinical Course as of 10/03/23 1739  Sun Oct 03, 2023  1506  Patient presents after a heroin overdose.  Patient does appear somnolent however he appears to be protecting his airway.  Has normal oxygen saturation.  Will hold off on Narcan  at this time holdable continue to monitor closely.  Will place the patient on end-tidal CO2 and cardiac monitoring [JK]  1733 No significant abnormalities noted on labs. [JK]  1736 Patient is standing up at the bedside he is alert and awake.  He states he is ready to leave. [JK]    Clinical Course User Index [JK] Randol Simmonds, MD                                 Medical Decision Making Problems Addressed: Accidental overdose of heroin, initial encounter [facility]): acute illness or injury that poses a threat to life or bodily functions  Amount and/or Complexity of Data Reviewed Labs: ordered. Decision-making details documented in ED Course.   Patient presented to the ED for evaluation after a presumed accidental heroin overdose.  The patient admitted to using heroin in the  ED.  Patient did not require any Narcan .  He was monitored in the ED.  Patient became more alert and awake.  He denied any intent to harm self.  Patient was ready for discharge and did not want to stay any longer in the ED.  I have provided information about outpatient treatment center evidence.  Will also provide him with prescription for Narcan      Final diagnoses:  Accidental overdose of heroin, initial encounter [facility])    ED Discharge Orders          Ordered    naloxone  (NARCAN ) nasal spray 4 mg/0.1 mL        10/03/23 1738               Randol Simmonds, MD 10/03/23 1740

## 2023-10-03 NOTE — Discharge Instructions (Addendum)
 Avoid using heroin.  Consider getting help for your heroin use.  The Narcan  is a medication to keep with you in the setting of an accidental heroin overdose.

## 2023-10-03 NOTE — ED Triage Notes (Signed)
 PT arrives via EMS. Bystander called 911 due to patient laying on the side of the road. Pt reports heroin use earlier today. Pt will occasionally fall asleep but maintains his airway. Pt is otherwise oriented x 4. Pt denies any complaints, aside from his sunburn. No si or hi.

## 2023-10-04 ENCOUNTER — Other Ambulatory Visit: Payer: Self-pay

## 2023-10-04 ENCOUNTER — Encounter (HOSPITAL_COMMUNITY): Payer: Self-pay | Admitting: *Deleted

## 2023-10-04 ENCOUNTER — Emergency Department (HOSPITAL_COMMUNITY)
Admission: EM | Admit: 2023-10-04 | Discharge: 2023-10-04 | Disposition: A | Discharge status: Home / Self Care | Attending: Emergency Medicine | Admitting: Emergency Medicine

## 2023-10-04 ENCOUNTER — Other Ambulatory Visit (HOSPITAL_COMMUNITY): Payer: Self-pay

## 2023-10-04 DIAGNOSIS — F1910 Other psychoactive substance abuse, uncomplicated: Secondary | ICD-10-CM | POA: Diagnosis not present

## 2023-10-04 DIAGNOSIS — E871 Hypo-osmolality and hyponatremia: Secondary | ICD-10-CM | POA: Diagnosis not present

## 2023-10-04 DIAGNOSIS — F259 Schizoaffective disorder, unspecified: Secondary | ICD-10-CM | POA: Insufficient documentation

## 2023-10-04 DIAGNOSIS — R000 Tachycardia, unspecified: Secondary | ICD-10-CM | POA: Insufficient documentation

## 2023-10-04 DIAGNOSIS — F251 Schizoaffective disorder, depressive type: Secondary | ICD-10-CM | POA: Diagnosis present

## 2023-10-04 DIAGNOSIS — Z5900 Homelessness unspecified: Secondary | ICD-10-CM

## 2023-10-04 DIAGNOSIS — R45851 Suicidal ideations: Secondary | ICD-10-CM | POA: Insufficient documentation

## 2023-10-04 LAB — CBC
HCT: 36 % — ABNORMAL LOW (ref 39.0–52.0)
Hemoglobin: 11.9 g/dL — ABNORMAL LOW (ref 13.0–17.0)
MCH: 28.3 pg (ref 26.0–34.0)
MCHC: 33.1 g/dL (ref 30.0–36.0)
MCV: 85.7 fL (ref 80.0–100.0)
Platelets: 213 10*3/uL (ref 150–400)
RBC: 4.2 MIL/uL — ABNORMAL LOW (ref 4.22–5.81)
RDW: 13.1 % (ref 11.5–15.5)
WBC: 9 10*3/uL (ref 4.0–10.5)
nRBC: 0 % (ref 0.0–0.2)

## 2023-10-04 LAB — COMPREHENSIVE METABOLIC PANEL WITH GFR
ALT: 33 U/L (ref 0–44)
AST: 139 U/L — ABNORMAL HIGH (ref 15–41)
Albumin: 4.4 g/dL (ref 3.5–5.0)
Alkaline Phosphatase: 63 U/L (ref 38–126)
Anion gap: 16 — ABNORMAL HIGH (ref 5–15)
BUN: 23 mg/dL — ABNORMAL HIGH (ref 6–20)
CO2: 19 mmol/L — ABNORMAL LOW (ref 22–32)
Calcium: 9 mg/dL (ref 8.9–10.3)
Chloride: 95 mmol/L — ABNORMAL LOW (ref 98–111)
Creatinine, Ser: 1.24 mg/dL (ref 0.61–1.24)
GFR, Estimated: >60 mL/min (ref >60)
Glucose, Bld: 111 mg/dL — ABNORMAL HIGH (ref 70–99)
Potassium: 4.1 mmol/L (ref 3.5–5.1)
Sodium: 130 mmol/L — ABNORMAL LOW (ref 135–145)
Total Bilirubin: 2.3 mg/dL — ABNORMAL HIGH (ref 0.0–1.2)
Total Protein: 7.2 g/dL (ref 6.5–8.1)

## 2023-10-04 LAB — ETHANOL: Alcohol, Ethyl (B): <15 mg/dL (ref <15)

## 2023-10-04 LAB — ACETAMINOPHEN LEVEL: Acetaminophen (Tylenol), Serum: <10 ug/mL — ABNORMAL LOW (ref 10–30)

## 2023-10-04 MED ORDER — BUPRENORPHINE HCL 8 MG SL SUBL: 8.0000 mg | SUBLINGUAL_TABLET | Freq: Once | SUBLINGUAL | Status: DC

## 2023-10-04 MED ORDER — BUPRENORPHINE HCL 8 MG SL SUBL
8.0000 mg | SUBLINGUAL_TABLET | Freq: Once | SUBLINGUAL | Status: AC
  Administered 2023-10-04: 8 mg via SUBLINGUAL
  Filled 2023-10-04: qty 1

## 2023-10-04 MED ORDER — LACTATED RINGERS IV BOLUS
1000.0000 mL | Freq: Once | INTRAVENOUS | Status: AC
  Administered 2023-10-04: 1000 mL via INTRAVENOUS

## 2023-10-04 MED ORDER — BUSPIRONE HCL 10 MG PO TABS
10.0000 mg | ORAL_TABLET | Freq: Three times a day (TID) | ORAL | Status: DC
  Administered 2023-10-04: 10 mg via ORAL
  Filled 2023-10-04 (×2): qty 1

## 2023-10-04 MED ORDER — OLANZAPINE 10 MG PO TABS
10.0000 mg | ORAL_TABLET | Freq: Two times a day (BID) | ORAL | Status: DC
  Administered 2023-10-04: 10 mg via ORAL
  Filled 2023-10-04: qty 1

## 2023-10-04 NOTE — ED Triage Notes (Signed)
 Patient presents to ed via [facility] states he was hearing voices ant they were telling him to drink the bleach because he had worms, also c/o parsites in his fingers and eyes. States a drug dealer told him to cut his ankle bracklet off because the Feds were following him. States he uses Meth and Heroin last used 3 days ago. Denies HI/SI

## 2023-10-04 NOTE — Consult Note (Signed)
 [facility] Psychiatric Consult Initial  Patient Name: .Cody Poole  MRN: [id number]  DOB: 28-Sep-1995  Consult Order details:  Orders (From admission, onward)     Start     Ordered   06/03/23 0820  CONSULT TO CALL ACT TEAM       Ordering Provider: Towana Ozell BROCKS, MD  Provider:  (Not yet assigned)  Question:  Reason for Consult?  Answer:  Psych consult   06/03/23 0819             Mode of Visit: In person    Psychiatry Consult Evaluation  Service Date: October 04, 2023 LOS:  LOS: 0 days  Chief Complaint: I'm ready to go  Primary Psychiatric Diagnoses  1.  Polysubstance abuse 2.  Homelessness  Assessment   [name] is a 28 y.o. [name] male with a past psychiatric history of polysubstance abuse, malingering (see 05/11/2021 [facility] Admission evaluation), homelessness, and schizophrenia, with pertinent medical comorbidities/history that includes subdermal hematoma and multiple incidents of accidental overdose in the context of severe substance abuse, who initially presented this encounter for complaints of auditory hallucinations, and drinking bleach to get rid of worms Patient is currently medically clear at this time and voluntary. Pt was just discharged from [facility] on 09/29/23 after a 5 day inpatient treatment stay.   Upon evaluation, patient presents with symptomology that is most indicative of polysubstance abuse, and brief substance induced psychotic disorder.  During my assessment, pt stated he did not drink bleach today, and does not remember saying this to anyone. Pt stated let me talk to that guy again (referring to the doctor) because I didn't say that. Pt denied any paranoia or delusions. He does not feel like any worms or parasites are inside of him. Pt denies any suicidal ideations. Denies homicidal ideations. Denies auditory or visual hallucinations. During assessment pt does not appear to be RTIS. Denies access to weapons or firearms. Pt states he is still homeless,  however occasionally is able to stay with his mother. Pt is requesting discharge from the hospital today.   Patient is not amenable to receiving medications upon discharge. Patient endorses he is not amenable to addressing his substance abuse and/or mental health, outside of being conditionally placed in temporary housing. Denies offer to transfer to substance abuse treatment. Per chart review, pt does have significant history for substance abuse dependence, several accidental overdoses, and malingering history where he is normally requesting to stay longer in the hospital.   Given evaluation performed today, recommendation is for psychiatric clearance, as well as additional recommendations listed below. During evaluation [name] is laying in no acute distress.He is alert, oriented x 4, calm, cooperative and attentive.  His mood is euthymic with congruent affect.  He has normal speech, and behavior.  Objectively there is no evidence of psychosis/mania or delusional thinking.  Patient is able to converse coherently, goal directed thoughts, no distractibility, or pre-occupation.  He also denies suicidal/self-harm/homicidal ideation, psychosis, and paranoia.  Patient answered question appropriately.   While future psychiatric events cannot be accurately predicted, the patient does not currently require further acute psychiatric care and does not currently meet [place]  involuntary commitment criteria. It is recommended that the patient continue treatment in outpatient care. A follow up plan and crisis plan are in place, have been discussed with the patient, and the patient agrees to the plan at time of discharge.    Spoke with Dr. Merilee who is in agreement with recommendation for discharge, as well  as the additional recommendations listed below.  Diagnoses:  Active Hospital problems: Principal Problem:   Polysubstance abuse (HCC) Active Problems:   Homelessness   Schizoaffective disorder,  depressive type (HCC)    Plan   # Homelessness # Polysubstance abuse  ## Psychiatric Recommendations:   -Continue current outpatient psychiatric medications, if/when amenable -Consider substance abuse treatment, if/when amenable -Recommend homeless shelter resources given upon discharge  ## Medical Decision Making Capacity: Patient has capacity  ## Further Work-up: None at this time  ## Disposition:-- There are no psychiatric contraindications to discharge at this time  ## Behavioral / Environmental: - No specific recommendations at this time.     ## Safety and Observation Level:  - Based on my clinical evaluation, I estimate the patient to be at low risk of self harm in the current setting and upon recommendation for discharge. - At this time, we recommend  routine. This decision is based on my review of the chart including patient's history and current presentation, interview of the patient, mental status examination, and consideration of suicide risk including evaluating suicidal ideation, plan, intent, suicidal or self-harm behaviors, risk factors, and protective factors. This judgment is based on our ability to directly address suicide risk, implement suicide prevention strategies, and develop a safety plan while the patient is in the clinical setting. Please contact our team if there is a concern that risk level has changed.  CSSR Risk Category:C-SSRS RISK CATEGORY: High Risk  Suicide Risk Assessment: Patient has following modifiable risk factors for suicide: medication noncompliance, which we are addressing by treatment recommendations/evaluation/resources given. Patient has following non-modifiable or demographic risk factors for suicide: male gender and psychiatric hospitalization Patient has the following protective factors against suicide: Access to outpatient mental health care, no history of suicide attempts, and no history of NSSIB  Thank you for this consult request.  Recommendations have been communicated to the primary team.  We will sign off at this time.   [name], NP       History of Present Illness   Million Maharaj is a 28 y.o. [name] male with a past psychiatric history of polysubstance abuse, malingering (see 05/11/2021 [facility] Admission evaluation), homelessness, and schizophrenia, with pertinent medical comorbidities/history that includes subdermal hematoma and multiple incidents of accidental overdose in the context of severe substance abuse.   Patient presents for psychiatric help.  Reportedly has been using methamphetamine and heroin.  States that last used a few days ago.  Although did get seen 2 days ago after overdose.   States he is hearing voices.Schizoaffective disorder.  States the voices told me he wants a stomach.  Because of this he drank some bleach.  No vomiting.  Pain.  States he has not had his medicines.  States he drank the bleach not to hurt himself to get rid of the worms.   Patient denies other ingestions.  Review of Systems  Psychiatric/Behavioral:  Positive for substance abuse (Meth, cannabis). Negative for hallucinations. Suicidal ideas: Conditional.Insomnia: Reports poor sleep d/t meth, cannabis, and homelessness.   All other systems reviewed and are negative.    Psychiatric and Social History  Psychiatric History:   Information collected from chart review/patient  Prev Dx/Sx: polysubstance abuse, malingering (see 05/11/2021 [facility] Admission evaluation), homelessness, and schizophrenia Current Psych Provider: Utilizes emergency services when needed Home Meds (current): None currently Previous Med Trials: Olanzapine , Zoloft , BuSpar , Wellbutrin , Suboxone , naloxone  Therapy: None endorsed or reported  Prior Psych Hospitalization: Multiple, most recent [facility] 2023  Prior Self  Harm: None endorsed or reported Prior Violence: None endorsed or reported  Family Psych History: None endorsed or reported Family Hx suicide:  None endorsed or reported  Social History:  Developmental Hx: None endorsed or reported Educational Hx: None endorsed or reported Occupational Hx: Homeless, gets disability check Legal Hx: None endorsed or reported Living Situation: Homeless Spiritual Hx: None endorsed or reported  Access to weapons/lethal means: None endorsed or reported  Substance History Alcohol: : None endorsed or reported  Type of alcohol : None endorsed or reported Last Drink : None endorsed or reported Number of drinks per day : None endorsed or reported History of alcohol withdrawal seizures : None endorsed or reported History of DT's : None endorsed or reported Tobacco: Daily Illicit drugs:  Everything; primarily methamphetamines, cannabis, opiates (on Suboxone ) Prescription drug abuse: : None endorsed or reported Rehab hx: : [facility]  Exam Findings  Physical Exam: As below Vital Signs:  Temp:  [98.7 F (37.1 C)-98.8 F (37.1 C)] 98.7 F (37.1 C) (06/23 0805) Pulse Rate:  [97-112] 112 (06/23 0805) Resp:  [11-20] 20 (06/23 0805) BP: (108-153)/(86-103) 153/103 (06/23 0805) SpO2:  [95 %-100 %] 99 % (06/23 0805) Weight:  [85.3 kg] 85.3 kg (06/23 0806) Blood pressure (!) 153/103, pulse (!) 112, temperature 98.7 F (37.1 C), temperature source Oral, resp. rate 20, height 6' (1.829 m), weight 85.3 kg, SpO2 99%. Body mass index is 25.5 kg/m.  Physical Exam Vitals and nursing note reviewed.  Constitutional:      General: He is not in acute distress.    Appearance: Normal appearance. He is normal weight. He is not ill-appearing, toxic-appearing or diaphoretic.  Pulmonary:     Effort: Pulmonary effort is normal.   Skin:    General: Skin is warm and dry.   Neurological:     Mental Status: He is alert and oriented to person, place, and time.     Motor: No tremor or seizure activity.   Psychiatric:        Attention and Perception: Attention and perception normal. He does not perceive auditory or  visual hallucinations.        Mood and Affect: Affect normal.        Speech: Speech normal.        Behavior: Behavior is cooperative.        Thought Content: Thought content is not paranoid. Thought content does not include homicidal or suicidal (Conditional) ideation. Thought content does not include suicidal plan.     Mental Status Exam: General Appearance: Disheveled  Orientation:  Full (Time, Place, and Person)  Memory:  Immediate;   Fair Recent;   Fair Remote;   Fair  Concentration:  Concentration: Fair and Attention Span: Fair  Recall:  Fair  Attention  Fair  Eye Contact:  Fair  Speech:  Clear and Coherent and Normal Rate  Language:  Fair  Volume:  Normal  Mood: Depressed  Affect:  Non-Congruent  Thought Process:  Coherent, Goal Directed, and Linear  Thought Content:  Logical  Suicidal Thoughts:  No  Homicidal Thoughts:  No  Judgement:  Intact  Insight:  Present  Psychomotor Activity:  Normal  Akathisia:  No  Fund of Knowledge:  Fair      Assets:  Manufacturing systems engineer Desire for Improvement Financial Resources/Insurance Leisure Time Physical Health Resilience  Cognition:  WNL  ADL's:  Intact  AIMS (if indicated):   0     Other History   These have been pulled in through the  EMR, reviewed, and updated if appropriate.  Family History:  The patient's Family history is unknown by patient.  Medical History: Past Medical History:  Diagnosis Date   Anxiety    Back pain    Cervical spondylosis    Depression    ETOH abuse    Heroin abuse (HCC)    Hypertension    IV drug abuse (HCC)    Methamphetamine use (HCC)    Opioid abuse with withdrawal (HCC)    Paranoid schizophrenia (HCC)    Polysubstance abuse (HCC)    Subdural hematoma caused by concussion (HCC) 05/08/2022   Fall[facility] to [facility] for observation   Tobacco abuse     Surgical History: History reviewed. No pertinent surgical history.   Medications:   Current Facility-Administered  Medications:    busPIRone  (BUSPAR ) tablet 10 mg, 10 mg, Oral, TID, Patsey Lot, MD, 10 mg at 10/04/23 9070   lactated ringers  bolus 1,000 mL, 1,000 mL, Intravenous, Once, Patsey Lot, MD   OLANZapine  (ZYPREXA ) tablet 10 mg, 10 mg, Oral, BID, Patsey Lot, MD, 10 mg at 10/04/23 9070  Current Outpatient Medications:    busPIRone  (BUSPAR ) 10 MG tablet, Take 1 tablet (10 mg total) by mouth 3 (three) times daily., Disp: 90 tablet, Rfl: 0   naloxone  (NARCAN ) nasal spray 4 mg/0.1 mL, Use for accidental overdose of opiates such as heroin, Disp: 1 each, Rfl: 1   nicotine  (NICODERM CQ  - DOSED IN MG/24 HOURS) 21 mg/24hr patch, Place 1 patch (21 mg total) onto the skin daily at 6 (six) AM., Disp: 28 patch, Rfl: 0   OLANZapine  (ZYPREXA ) 10 MG tablet, Take 1 tablet (10 mg total) by mouth 2 (two) times daily., Disp: 60 tablet, Rfl: 0  Allergies: Allergies  Allergen Reactions   Haldol  [Haloperidol ] Swelling    Tongue swelling  Dysarthria  Relieved with benadryl      [name], NP

## 2023-10-04 NOTE — ED Notes (Signed)
 Patient changed into scrubs and clothes inventoried and placed in locker #3. Patient given coke to drink

## 2023-10-04 NOTE — Discharge Instructions (Signed)
 Homeless Shelter List:  Health and safety inspector [facility] [place]) [address] [place], [place]  Phone: [phone]  [facility] [address] [place], [place] [address] Phone: [phone]  [facility] (Women only) [address] Isadora Solon [place], [place] [address] Phone: [phone]  [facility] [address], [place] [address] Phone: [phone]  [facility]: [phone]. [address] [place], [place] [address] Phone: [phone]  [facility]  [address] [place], [place] [address] (Check in at 6:00PM for placement at a local shelter) Phone: [phone]  Outpatient psychiatric Services  Walk in hours for medication management Monday, Wednesday, Thursday, and Friday from 8:00 AM to 11:00 AM Recommend arriving by by 7:30 AM.  It is first come first serve.    Walk in hours for therapy intake Monday and Wednesday only 8:00 AM to 11:00 AM Encouraged to arrive by 7:30 AM.  It is first come first serve   Inpatient patient psychiatric services The Facility Based Crisis Unit offers comprehensive behavioral heath care services for mental health and substance abuse treatment.  Social work can also assist with referral to or getting you into a rehabilitation program short or long term  Set designer Resources:  Intensive Outpatient Programs: [employer]      [address] [place], [place] [phone] Both a day and evening program       [facility] Outpatient     [address]        [place], [place] [address] [phone]         ADS: Alcohol & Drug Svcs [address] [place] [phone]  [facility] Mental Health ACCESS LINE: [phone] or [phone] [address] [place], [place] [address] [email or web address]   Substance Abuse Resources: Alcohol  and Drug Services  [phone] [facility] [phone] [facility] [place] [phone] [place] [phone] Residential & Outpatient Substance Abuse Program  [phone]  Psychological Services: [facility]  [phone] [facility]  [phone] [facility], [phone] [place]. [address], [place], ACCESS LINE: [phone] or [phone], [email or web address]  Mobile Crisis Teams:                                        Therapeutic Alternatives         Mobile Crisis Care Unit [phone]             [facility] [address] [place] [phone]                                         Interventionist [address] [place] [phone]  Self-Help/Support Groups: Mental Health Assoc. of [employer] of support groups ([phone] (call for more info)  Narcotics Anonymous (NA) Caring Services [address] [place] - 2 meetings at this location  Residential Treatment Programs:  [facility]      [address]        [place]       [phone]         [facility] [address], [place] [address] [place], [place]  [address] [phone]  [name]  [address]  [address] [place], [place] [address] [phone] Admissions: 8am-3pm M-F  [facility]     [address]        [place], [place] [address]       [phone]         [facility] [address] Christianna COYER [place], [place] [phone]  [facility] [address] [place], [place] [phone]  [facility] - Intensive Outpatient      [address]     [place], [place]       [phone]         [facility] (Addiction Recovery Care Assoc.)     [address] [place], [place] [phone] or [phone]  Residential Treatment Services (RTS),  Medicaid [address] [place], [place] [phone]  [facility] [address] [address]                                               [address] [place] [phone]  [facility] Resources: [phone]               General Therapy                                                Mliss Rival, PhD        [address] [place], [place] [address]         [phone]   Insurance  [facility] Behavioral   [address] [place], [place] [address] [phone]  [facility] Recovery [address] [place], [place] [address] [phone] Insurance/Medicaid/sponsorship through [facility] and Families                                              [address]                                        [place], [place] [address]    Therapy/tele-psych/case         [phone]          [facility] [address], [place]  [address]  Adolescent/group home/case management [phone]                                           Recardo Rival PhD       General therapy       Insurance   [phone]         Dr. Curry, [place], M-F [phone]  [facility]  [employer] [facility] Dept. [address]  [address]         [address] [place] [address]  Tinnie Keenan Keenan Phone:  [phone]                                  Phone:  [phone]                   Phone:  [phone]  [facility], [phone] [facility] [place]- [phone]       -     [facility] in [place], [address],             [phone], Outpatient Services [facility]) Monday - Friday 8am - 3pm          Sat & [place] sin - 2pm [address] [place]  [place], [place] [address]   [phone]      www.i[email or web address] IRC offers among other critical resources: showers, laundry, barbershop, phone bank, mailroom, computer lab, medical clinic, gardens and a bike maintenance area.   AREA SHELTERS  [employer]  (Men & women) [address] [employer] [address] [place] [phone]  [facility] [place] (Men/women/families) [address] [place] [phone]   [facility] (Families with children) [phone] [address]  [place] [phone]   [facility] (Domestic Violence Shelter) [address] [place]  [address]  [place] [phone]   [facility] (Children ages 73-17) [address] [place]  [address]  [place] [phone]   [facility]   (Women & children) [address] [place] [phone]   [facility] (Women/substance abuse) [address]  [place] [phone]   [facility] (Men) [address]  [place] [phone]   [facility] (Men) [address]  [place] [phone]  [employer] (Women) [phone] [address]  [place] [phone]   [facility] (Single women & women with children) [address]  [place] [phone]  [facility] (Men/women/families) [address] [phone]    [facility]   (Domestic Violence shelter) [address]  [place] [phone]   [facility] (Men & women) [address].  [place] [phone]  [facility] (Men) [address]  [employer] [phone]   [facility] (Men) [address]  [place] [phone]   Holiday representative (Single women & families) [address]  [place] [phone]  [facility]. (Men/women & families) [address]  [place] [phone]    If you are at risk of losing your housing (throughout [facility]) call the Housing Hotline at 574-688-8201. You may also  contact 2-1-1, a FREE service of the [address] that provides information about many resources including housing. Dial 211, or visit online at PooledIncome.pl. [facility]:  House of [place], contact Marcey Clause [phone] (men only)  [facility] in [place]:  90 day homeless program for women and men;                             contact Rev. Chambers [phone]  [facility]:  [facility]:  men/women/children [phone]  [facility]:  Shelter in [place], [place] [phone]                       [facility] in [place], [place] Jubilee [phone]   [facility]:  Crisis Council for Abused Women, [phone]; (women and children)  [facility]:  [employer], men/women/children; ([phone]                           [facility] in [place], [phone]; substance abuse halfway house for men              [facility]; 4 bedroom house in [place] [facility] for homeless women, contact Richardson Balloon [phone]               [facility] in [place], [phone] (women and children)               Friend to Friend, for abused women and children, 24 hour crisis line, 508-031-7788, Arlean Roosevelt  [facility], halfway house for women, [place], [phone]  [facility]:  [facility], [phone]; open Mon-Thurs from Gjw85 - March 15 when temp is below 32 degrees                              [facility]; Juliene Mott [name], [phone]; cell [phone]; open 24/7  [facility]:  Outreach for [place] - Rev [place] - [phone]  [place]:  transitional housing for women and children; Samule Seeds [phone]
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
## 2023-10-14 ENCOUNTER — Other Ambulatory Visit (HOSPITAL_COMMUNITY): Payer: Self-pay
